# Patient Record
Sex: Male | Born: 1935 | Race: Black or African American | Hispanic: No | Smoking: Former smoker
Health system: Southern US, Community
[De-identification: ages and names within clinical notes are randomized; demographics above are authoritative.]

## PROBLEM LIST (undated history)

## (undated) DIAGNOSIS — IMO0001 Reserved for inherently not codable concepts without codable children: Secondary | ICD-10-CM

## (undated) DIAGNOSIS — R918 Other nonspecific abnormal finding of lung field: Secondary | ICD-10-CM

## (undated) DIAGNOSIS — I82409 Acute embolism and thrombosis of unspecified deep veins of unspecified lower extremity: Secondary | ICD-10-CM

## (undated) DIAGNOSIS — N183 Chronic kidney disease, stage 3 unspecified: Secondary | ICD-10-CM

## (undated) DIAGNOSIS — I2699 Other pulmonary embolism without acute cor pulmonale: Secondary | ICD-10-CM

## (undated) DIAGNOSIS — F0390 Unspecified dementia without behavioral disturbance: Secondary | ICD-10-CM

## (undated) DIAGNOSIS — E119 Type 2 diabetes mellitus without complications: Secondary | ICD-10-CM

## (undated) DIAGNOSIS — I10 Essential (primary) hypertension: Secondary | ICD-10-CM

## (undated) DIAGNOSIS — I340 Nonrheumatic mitral (valve) insufficiency: Secondary | ICD-10-CM

## (undated) DIAGNOSIS — E7800 Pure hypercholesterolemia, unspecified: Secondary | ICD-10-CM

## (undated) DIAGNOSIS — E039 Hypothyroidism, unspecified: Secondary | ICD-10-CM

## (undated) DIAGNOSIS — I272 Pulmonary hypertension, unspecified: Secondary | ICD-10-CM

## (undated) DIAGNOSIS — J449 Chronic obstructive pulmonary disease, unspecified: Secondary | ICD-10-CM

## (undated) DIAGNOSIS — I482 Chronic atrial fibrillation, unspecified: Secondary | ICD-10-CM

## (undated) DIAGNOSIS — I517 Cardiomegaly: Secondary | ICD-10-CM

## (undated) DIAGNOSIS — R55 Syncope and collapse: Secondary | ICD-10-CM

## (undated) DIAGNOSIS — N200 Calculus of kidney: Secondary | ICD-10-CM

## (undated) DIAGNOSIS — N281 Cyst of kidney, acquired: Secondary | ICD-10-CM

## (undated) DIAGNOSIS — I2510 Atherosclerotic heart disease of native coronary artery without angina pectoris: Secondary | ICD-10-CM

## (undated) DIAGNOSIS — Z5189 Encounter for other specified aftercare: Secondary | ICD-10-CM

## (undated) DIAGNOSIS — R001 Bradycardia, unspecified: Secondary | ICD-10-CM

## (undated) DIAGNOSIS — I071 Rheumatic tricuspid insufficiency: Secondary | ICD-10-CM

## (undated) DIAGNOSIS — I452 Bifascicular block: Secondary | ICD-10-CM

## (undated) DIAGNOSIS — I5042 Chronic combined systolic (congestive) and diastolic (congestive) heart failure: Secondary | ICD-10-CM

## (undated) DIAGNOSIS — C189 Malignant neoplasm of colon, unspecified: Secondary | ICD-10-CM

## (undated) DIAGNOSIS — I951 Orthostatic hypotension: Secondary | ICD-10-CM

## (undated) DIAGNOSIS — K439 Ventral hernia without obstruction or gangrene: Secondary | ICD-10-CM

## (undated) HISTORY — PX: ABDOMINAL SURGERY: SHX537

---

## 2004-09-14 ENCOUNTER — Emergency Department (HOSPITAL_COMMUNITY): Admission: EM | Admit: 2004-09-14 | Discharge: 2004-09-14 | Payer: Self-pay | Admitting: Emergency Medicine

## 2005-08-19 ENCOUNTER — Emergency Department (HOSPITAL_COMMUNITY): Admission: EM | Admit: 2005-08-19 | Discharge: 2005-08-19 | Payer: Self-pay | Admitting: Emergency Medicine

## 2005-08-22 ENCOUNTER — Inpatient Hospital Stay (HOSPITAL_COMMUNITY): Admission: EM | Admit: 2005-08-22 | Discharge: 2005-08-25 | Payer: Self-pay | Admitting: Emergency Medicine

## 2005-09-12 ENCOUNTER — Emergency Department (HOSPITAL_COMMUNITY): Admission: EM | Admit: 2005-09-12 | Discharge: 2005-09-12 | Payer: Self-pay | Admitting: Emergency Medicine

## 2005-09-18 ENCOUNTER — Emergency Department (HOSPITAL_COMMUNITY): Admission: EM | Admit: 2005-09-18 | Discharge: 2005-09-18 | Payer: Self-pay | Admitting: Emergency Medicine

## 2005-10-08 ENCOUNTER — Inpatient Hospital Stay (HOSPITAL_COMMUNITY): Admission: EM | Admit: 2005-10-08 | Discharge: 2005-10-12 | Payer: Self-pay | Admitting: Emergency Medicine

## 2005-10-17 ENCOUNTER — Emergency Department (HOSPITAL_COMMUNITY): Admission: EM | Admit: 2005-10-17 | Discharge: 2005-10-17 | Payer: Self-pay | Admitting: Emergency Medicine

## 2006-01-19 ENCOUNTER — Inpatient Hospital Stay (HOSPITAL_COMMUNITY): Admission: EM | Admit: 2006-01-19 | Discharge: 2006-01-22 | Payer: Self-pay | Admitting: Emergency Medicine

## 2006-02-20 HISTORY — PX: CARDIAC CATHETERIZATION: SHX172

## 2006-02-22 ENCOUNTER — Inpatient Hospital Stay (HOSPITAL_COMMUNITY): Admission: AD | Admit: 2006-02-22 | Discharge: 2006-03-07 | Payer: Self-pay | Admitting: Cardiovascular Disease

## 2006-02-22 ENCOUNTER — Encounter (INDEPENDENT_AMBULATORY_CARE_PROVIDER_SITE_OTHER): Payer: Self-pay | Admitting: *Deleted

## 2006-02-22 ENCOUNTER — Ambulatory Visit: Payer: Self-pay | Admitting: Pulmonary Disease

## 2006-02-22 ENCOUNTER — Encounter: Payer: Self-pay | Admitting: Emergency Medicine

## 2006-02-27 ENCOUNTER — Encounter (INDEPENDENT_AMBULATORY_CARE_PROVIDER_SITE_OTHER): Payer: Self-pay | Admitting: *Deleted

## 2006-08-06 ENCOUNTER — Inpatient Hospital Stay (HOSPITAL_COMMUNITY): Admission: EM | Admit: 2006-08-06 | Discharge: 2006-08-07 | Payer: Self-pay | Admitting: Emergency Medicine

## 2007-01-04 ENCOUNTER — Inpatient Hospital Stay (HOSPITAL_COMMUNITY): Admission: EM | Admit: 2007-01-04 | Discharge: 2007-01-05 | Payer: Self-pay | Admitting: Emergency Medicine

## 2007-05-24 ENCOUNTER — Inpatient Hospital Stay (HOSPITAL_COMMUNITY): Admission: EM | Admit: 2007-05-24 | Discharge: 2007-05-27 | Payer: Self-pay | Admitting: Emergency Medicine

## 2007-06-16 ENCOUNTER — Emergency Department (HOSPITAL_COMMUNITY): Admission: EM | Admit: 2007-06-16 | Discharge: 2007-06-16 | Payer: Self-pay | Admitting: Emergency Medicine

## 2008-05-29 ENCOUNTER — Emergency Department (HOSPITAL_COMMUNITY): Admission: EM | Admit: 2008-05-29 | Discharge: 2008-05-29 | Payer: Self-pay | Admitting: Emergency Medicine

## 2008-07-14 ENCOUNTER — Emergency Department (HOSPITAL_COMMUNITY): Admission: EM | Admit: 2008-07-14 | Discharge: 2008-07-14 | Payer: Self-pay | Admitting: Emergency Medicine

## 2008-12-20 ENCOUNTER — Observation Stay (HOSPITAL_COMMUNITY): Admission: EM | Admit: 2008-12-20 | Discharge: 2008-12-21 | Payer: Self-pay | Admitting: Emergency Medicine

## 2010-05-25 LAB — COMPREHENSIVE METABOLIC PANEL
ALT: 18 U/L (ref 0–53)
AST: 23 U/L (ref 0–37)
CO2: 30 mEq/L (ref 19–32)
Chloride: 104 mEq/L (ref 96–112)
Creatinine, Ser: 1.13 mg/dL (ref 0.4–1.5)
GFR calc Af Amer: >60 mL/min (ref >60)
GFR calc non Af Amer: >60 mL/min (ref >60)
Sodium: 138 mEq/L (ref 135–145)
Total Bilirubin: 0.7 mg/dL (ref 0.3–1.2)

## 2010-05-25 LAB — CBC
MCV: 98.2 fL (ref 78.0–100.0)
RBC: 3.23 MIL/uL — ABNORMAL LOW (ref 4.22–5.81)
WBC: 4.4 10*3/uL (ref 4.0–10.5)

## 2010-05-25 LAB — DIFFERENTIAL
Basophils Absolute: 0 10*3/uL (ref 0.0–0.1)
Basophils Relative: 0 % (ref 0–1)
Eosinophils Absolute: 0 10*3/uL (ref 0.0–0.7)
Eosinophils Relative: 1 % (ref 0–5)

## 2010-05-26 LAB — DIFFERENTIAL
Eosinophils Absolute: 0 10*3/uL (ref 0.0–0.7)
Eosinophils Relative: 1 % (ref 0–5)
Lymphocytes Relative: 35 % (ref 12–46)
Lymphs Abs: 1.4 10*3/uL (ref 0.7–4.0)
Monocytes Relative: 4 % (ref 3–12)
Neutrophils Relative %: 61 % (ref 43–77)

## 2010-05-26 LAB — COMPREHENSIVE METABOLIC PANEL
ALT: 20 U/L (ref 0–53)
AST: 25 U/L (ref 0–37)
Calcium: 10 mg/dL (ref 8.4–10.5)
GFR calc Af Amer: >60 mL/min (ref >60)
Sodium: 137 mEq/L (ref 135–145)
Total Protein: 7.8 g/dL (ref 6.0–8.3)

## 2010-05-26 LAB — CBC
MCHC: 34.5 g/dL (ref 30.0–36.0)
RDW: 13.8 % (ref 11.5–15.5)

## 2010-05-26 LAB — PROTIME-INR: Prothrombin Time: 12.4 seconds (ref 11.6–15.2)

## 2010-06-01 LAB — BASIC METABOLIC PANEL
CO2: 25 mEq/L (ref 19–32)
Calcium: 9.8 mg/dL (ref 8.4–10.5)
Creatinine, Ser: 1.29 mg/dL (ref 0.4–1.5)
Glucose, Bld: 107 mg/dL — ABNORMAL HIGH (ref 70–99)
Potassium: 3.8 mEq/L (ref 3.5–5.1)
Sodium: 139 mEq/L (ref 135–145)

## 2010-06-01 LAB — DIFFERENTIAL
Basophils Absolute: 0 10*3/uL (ref 0.0–0.1)
Basophils Relative: 1 % (ref 0–1)
Neutro Abs: 2.3 10*3/uL (ref 1.7–7.7)
Neutrophils Relative %: 49 % (ref 43–77)

## 2010-06-01 LAB — URINALYSIS, ROUTINE W REFLEX MICROSCOPIC
Glucose, UA: NEGATIVE mg/dL
Specific Gravity, Urine: 1.025 (ref 1.005–1.030)
pH: 5.5 (ref 5.0–8.0)

## 2010-06-01 LAB — CBC
MCHC: 34.4 g/dL (ref 30.0–36.0)
Platelets: 173 10*3/uL (ref 150–400)
RDW: 13.7 % (ref 11.5–15.5)

## 2010-07-05 NOTE — Consult Note (Signed)
NAME:  Bryce Mcdonald, Bryce Mcdonald             ACCOUNT NO.:  [id number]   MEDICAL RECORD NO.:  [id number]          PATIENT TYPE:  INP   LOCATION:  A301                          FACILITY:  [facility]   PHYSICIAN:  Tilford Pillar, MD      DATE OF BIRTH:  November 05, 1921   DATE OF CONSULTATION:  05/24/2007  DATE OF DISCHARGE:                                 CONSULTATION   REASON FOR CONSULTATION:  Abdominal pain and abdominal distention.   HISTORY OF PRESENT ILLNESS:  The patient is an 77 year old male with a  history of hypertension, history of colon cancer and history of prostate  cancer who presented to [facility] with approximately 24 hours  of increasing abdominal pain and abdominal distention.  This has slowly  increased over the prior subsequent days and continued to increase at  the time of this admission, he had decreased bowel function.  He has had  similar episodes in the past associated with partial small bowel  obstructions.  Currently, the patient states he is feeling better.  He  had a large bowel movement as of this morning which was normal in  consistency, nonbloody, no melena, no hematochezia.  He has had  improvement in his nausea with no episodes of emesis.  He has had fever  or chills.  Abdominal pain has resolved.  On discussion with the  patient, the patient's last colonoscopy was approximately 1 year ago,  but the patient states that at time it was a normal colonoscopy.  Additionally, the patient does have an abdominal hernia, but denies any  signs or symptoms of incarceration or strangulation.  The patient is  currently passing flatus.   PAST MEDICAL HISTORY:  1. Colon cancer.  2. Prostate cancer.  3. Hypertension.  4. History of DVT.   PAST SURGICAL HISTORY:  1. Colon resection for colon cancer.  The patient is unsure when the      colon resection occurred.  2. History of radical prostatectomy.  3. The patient has had an attempted ventral hernia repair at the [place]  approximately 1 year ago.   MEDICATIONS:  1. Docusate.  2. Oxycodone for pain.  3. Simvastatin.  4. Lisinopril.  5. Metoprolol.  6. Aspirin daily.  7. Lasix.  8. Lovenox 80 mg daily.   ALLERGIES:  No known drug allergies.   SOCIAL HISTORY:  No tobacco.  No alcohol.  No recreational drug use.   PHYSICAL EXAMINATION:  VITAL SIGNS:  Temperature 99.1 heart rate 71,  respirations 18, blood pressure 131/61.  GENERAL:  The patient is lying in supine position in a hospital bed.  He  is in no acute distress.  He is alert and oriented x3.  He is somewhat  disheveled in appearance.  HEENT:  Pupils are equal, round and reactive.  Extraocular extraocular  movements are intact.  No scleral icterus or conjunctival pallor is  noted.  His oral mucosa is pink.  He does have some noted tooth loss,  but occlusion appears to be normal.  Trachea is midline.  No cervical  lymphadenopathy appreciated.  PULMONARY:  Unlabored respirations.  He is clear to auscultation.  CARDIOVASCULAR:  Regular rate and rhythm.  ABDOMEN:  Soft without with somewhat diminished bowel sounds.  He is  moderately distended.  He is nontender on palpation.  He does have an  incisional midline hernia.  This is easily reducible and no incarcerated  material is noted in the hernia.  He has no peritoneal signs.  No masses  are appreciated.  EXTREMITIES:  Warm and dry.   LABORATORY DATA AND X-RAY FINDINGS:  CBC with white blood cell count  5.3, hemoglobin 12.1, hematocrit 34.8, platelets 137.  Basic metabolic  panel with sodium 144, potassium 3.5, chloride 106, bicarb 30, BUN 15,  creatinine 1.01, blood glucose 123.  Acute abdominal series demonstrates  no evidence of free air.  There are several loops of bowel with air  fluid levels.  No clear transition point.  He does have casts noted  within the rectum.   ASSESSMENT:  At this point, I suspect a partial small bowel obstruction  at the most.  Given the patient's current  presentation and  symptomatology, it appears that this is actually resolving.  I suspect a  likely etiology of adhesive processes as etiology for his partial small  bowel obstruction.  He does have a colon cancer history, but based on  his recent colonoscopy that was a year ago, this would be an unlikely  cause of bowel obstruction and as well, he does have a hernia.  I also  explained to him this is not the cause of his etiology of his  symptomatology.   RECOMMENDATIONS:  At this time, I would recommend continuing IV fluid  hydration and continuing limited bowel rest with ice chips and sips of  water at this point with continued NG suction for at least the next 24  hours secondary to the patient's distention.  As long as the patient  continues to have bowel function, I would then recommend advancing his  diet slowly and starting with a clear diet and advancing as tolerated.  This was discussed with the patient.  No acute surgical indications are  indicated.  I will continue to follow the patient's progression with  you.   I appreciate the opportunity to participate in this patient's care.      Tilford Pillar, MD  Electronically Signed     BZ/MEDQ  D:  05/24/2007  T:  05/24/2007  Job:  [id number]

## 2010-07-05 NOTE — H&P (Signed)
NAME:  Bryce Mcdonald, Bryce Mcdonald             ACCOUNT NO.:  [id number]   MEDICAL RECORD NO.:  [id number]          PATIENT TYPE:  INP   LOCATION:  A301                          FACILITY:  [facility]   PHYSICIAN:  Skeet Latch, DO    DATE OF BIRTH:  12-06-1921   DATE OF ADMISSION:  05/23/2007  DATE OF DISCHARGE:  LH                              HISTORY & PHYSICAL   CHIEF COMPLAINT:  Abdominal pain.   ADMITTING DIAGNOSES:  1. Small-bowel obstruction versus ileus.  2. History of colon cancer.  3. History of prostate cancer.  4. History of hypertension.   HISTORY OF PRESENT ILLNESS:  This is an 77 year old African American  male who presents with abdominal pain.  The patient states that after  dinner he started having abdominal distention and pain.  The patient  states the pain is located in his mid abdomen and abdominal region with  some radiation to his back.  He does not characterize it as severe pain,  more of an achiness in nature.  It is aggravated by deep breathing.  The  patient was seen at [facility] back in November 2008 for abdominal pain  and was admitted and discharged with a 2-day hospital stay.  At that  time his abdominal series showed a minimal ileus with no obstruction.  CT showed gallbladder dilatation and pericholecystic fluid suggesting  cholecystitis.  The patient presented again with similar symptoms.  He  did have an acute abdominal series in the ER which showed COPD and early  small-bowel obstruction versus ileus.  The patient currently has an NG  tube in place.  The patient states that his abdominal pain is greatly  improved and at this point is stable.   PAST MEDICAL HISTORY:  1. Colon cancer.  2. Hypertension.  3. Prostate cancer.  4. Deep vein thrombosis.   PAST SURGICAL HISTORY:  Colon resection with extensive abdominal surgery  secondary to colon CA.   SOCIAL HISTORY:  Nonsmoker, nondrinker.  No history of drug abuse.   ALLERGIES:  No known drug allergies.   FAMILY HISTORY:  Unknown.   CURRENT HOME MEDICATIONS:  1. Docusate sodium 100 mg twice daily.  2. Oxycodone/acetaminophen as needed.  3. Simvastatin 40 mg at bedtime.  4. Lisinopril 5 mg once a day.  5. Metoprolol 50 mg twice a day.  6. Aspirin 81 mg daily.  7. Furosemide 40 mg once a day.  8. Lovenox 80 mg daily.   REVIEW OF SYSTEMS:  GI:  Positive for abdominal pain with some nausea  and vomiting.  CARDIOVASCULAR:  No chest pain, no palpitations.  RESPIRATORY:  No shortness of breath or dyspnea.  MUSCULOSKELETAL:  No  arthralgias, myalgias.  HEENT:  Unremarkable.  NEUROLOGIC:  Unremarkable.   PHYSICAL EXAMINATION:  VITAL SIGNS:  Temperature is 99.1, pulse 71,  respirations 18, blood pressure is 131/61.  GENERAL:  He is well-developed, well-nourished, well-hydrated, in no  acute distress.  HEENT:  Head is atraumatic, normocephalic.  PERRLA.  EOMI.  NECK:  Soft, supple, nontender, nondistended.  No JVD, no thyromegaly.  CARDIAC:  Regular rate and rhythm.  Distant heart sounds.  No murmurs  appreciated.  LUNGS:  Clear to auscultation bilaterally.  No rales, rhonchi or  wheezing.  ABDOMEN:  Distended.  No tenderness on deep palpitation.  He does have  bowel sounds that are slightly hypoactive.  No rigidity or guarding.  EXTREMITIES:  He does have chronic venous stasis changes to bilateral  lower extremities with trace edema bilaterally.  NEUROLOGIC:  Cranial nerves II-XII are grossly intact.  The patient is  alert and oriented and awake.   LABORATORIES:  Urinalysis is fairly unremarkable.  Lipase is 30.  Sodium  140, potassium 3.2, chloride is 104, CO2 26, glucose 125, BUN 17,  creatinine 1.06.  White count is 5000, hemoglobin 12.9, hematocrit 37.3,  platelets 178.   ASSESSMENT:  1. Abdominal pain, probably secondary to ileus versus obstruction.  2. History of colon cancer.  3. History of prostate cancer.  4. History of hypertension.  5. History of deep vein thrombosis.    PLAN:  1. The patient will be admitted to general medical bed, the service of      [facility].  2. The patient will have nasogastric tube in place with low      intermittent suction for decompression at this time.  3. Will get a general surgery consult and await their recommendations.  4. Will continue IV hydration and the patient will be kept n.p.o. at      this time.  5. For his hypertension, will place the patient on his home      medications when he is no longer n.p.o. and he can      tolerate clear liquids.  6. The patient will be placed on GI as well as DVT prophylaxis also.   The patient will continue to be monitored very closely.      Skeet Latch, DO  Electronically Signed     SM/MEDQ  D:  05/24/2007  T:  05/24/2007  Job:  [id number]

## 2010-07-05 NOTE — Discharge Summary (Signed)
NAME:  Bryce Mcdonald, Bryce Mcdonald             ACCOUNT NO.:  [id number]   MEDICAL RECORD NO.:  [id number]          PATIENT TYPE:  INP   LOCATION:  A315                          FACILITY:  [facility]   PHYSICIAN:  Dorris Singh, DO    DATE OF BIRTH:  [birth year]   DATE OF ADMISSION:  08/06/2006  DATE OF DISCHARGE:  06/17/2008LH                               DISCHARGE SUMMARY   ADMISSION DIAGNOSES:  1. Pancytopenia.  2. Diarrhea.  3. Weakness.   DISCHARGE DIAGNOSES:  1. Cholelithiasis.  2. Possible exposure to Lyme disease.  3. Nausea and vomiting.  4. Pancytopenia.   PRIMARY CARE PHYSICIAN:  Patient does not have a primary care physician.   CONSULTATIONS:  There were no consults on this case.   PROCEDURE:  Patient had an acute abdominal series that showed no acute  abdominal findings.  COPD was stable basal scarring on August 06, 2006.  Patient had an abdominal ultrasound done which did show cholelithiasis  without evidence of cholecystitis, a small peri-pancytopenic lymph node,  poor visualization of the pancreas.  Did not see a lymph node at that  time on a CT done in January 2008.  Recommended follow-up CT to assess  and concern for pancreatic disease small bilateral renal cysts.   HISTORY AND PHYSICAL:  Patient is an 77 year old African American male  who presented to [facility] on August 06, 2006, with a chief  complaint of weakness and nausea and vomiting.  Had stated that several  weeks ago he was bitten by a tick, so he was concerned of the  possibility of Lyme disease.  He has a significant history of colon  cancer and he is a poor historian as to whether or not he is cancer-  free.  Patient stated that every time he ate he got very nauseated and  would throat up.  Denies any other concerns at this time but had become  very weak from this episode.   PAST MEDICAL HISTORY:  Colon cancer.   FAMILY HISTORY:  Patient could not give me any answers at that point in  time.   It  was based on the patient's clinical appearance.  It was determined  that patient should be admitted and hydrated and given antiemetic  medication.  He was given that throughout the night and his nausea and  vomiting did subside.  He did not have any other episodes of diarrhea,  which we had ordered cultures, ova and parasites tests for.  Patient was  able to eat a full diet at that point in time.  It was determined that  possible abdominal ultrasound should be done based on the history and  timing of the nausea and vomiting.  Patient  had an abdominal ultrasound  done today which showed the following as read above.  Also another CBC  was done.  One at admission, his white count was 2.5 and then today it  was 1.9.  He had hemoglobin of 12.6 and hematocrit 35.6 on admission and  then today hemoglobin was 11.9 and hematocrit was 33.5.  This is all  probably due  to a dilutional effecting, worsening the current  pancytopenia.  He also had PTT/INR done and his INR was 1.2.  His  initial CMP was sodium 136, potassium 3.8, chloride 98, glucose 116 and  CO2 28, BUN 13.  His repeat CMP showed sodium 138, potassium 3.5,  chloride 105, CO2 27, glucose 105, BUN 12, creatinine 1.2.  His stool  culture actually was not done but his blood culture after one day's  growth did not show anything.  At this point in time it was determined  patient could be discharged to home.   CONDITION ON DISCHARGE:  Good and stable.   DISPOSITION:  To home.   DISCHARGE MEDICATIONS:  1. Augmentin XR 1000 mg two tablets b.i.d. for 10 days.  2. Phenergan 12.5 mg one tablet p.o. t.i.d. p.r.n. nausea.   DISCHARGE INSTRUCTIONS:  [facility] set up an appointment for  him to be seen by Dr. Malvin Johns on August 09, 2006, at 11 o'clock.  Also,  will set up appointments with Dr. Mariel Sleet of hematology/oncology  regarding his pancytopenia.  I recommended patient to be seen by local  primary care doctor in this area.  Patient  was instructed to follow up  at the emergency room if symptoms worsen.      Dorris Singh, DO  Electronically Signed     CB/MEDQ  D:  08/07/2006  T:  08/08/2006  Job:  [id number]

## 2010-07-05 NOTE — H&P (Signed)
NAME:  Bryce Mcdonald, Bryce Mcdonald             ACCOUNT NO.:  [id number]   MEDICAL RECORD NO.:  [id number]          PATIENT TYPE:  INP   LOCATION:  A315                          FACILITY:  [facility]   PHYSICIAN:  Dorris Singh, DO    DATE OF BIRTH:  1921-03-24   DATE OF ADMISSION:  08/06/2006  DATE OF DISCHARGE:  LH                              HISTORY & PHYSICAL   The patient's primary care is in [place].   PAST MEDICAL HISTORY:  The patient is an 77 year old African-American  male who presented to [facility] emergency room with the chief complaint  of weakness. States that he had been weak for several weeks and had been  bitten by a tick about 2 weeks ago. The patient has a significant  history of colon cancer in which he states he has had multiple  surgeries. The patient states he does not know if he is cancer free at  this point in time but states that he was not feeling well, so at this  point in time based on current blood work, it was determined the patient  should be admitted to the service of [facility] at that point in time.   PAST MEDICAL HISTORY:  Colon cancer.   FAMILY HISTORY:  The patient did not know.   PAST SURGICAL HISTORY:  In the past, the patient has had extensive  abdominal surgery to resect the colon cancer.   SOCIAL HISTORY:  Nonsmoker, nondrinker. No drug abuse known. No known  drug allergies.   CURRENT MEDICATIONS:  1. Docusate sodium 100 mg twice a day.  2. Oxycodone and acetaminophen as needed.  3. Coumadin 2.5, dosing as prescribed.   REVIEW OF SYSTEMS:  Pertinent for positive weakness, diarrhea, nausea,  vomiting, fatigue. For skin, positive sore at tick bite site.   PHYSICAL EXAMINATION:  The patient is an 77 year old African-American  male who was well-nourished, well-developed, in no acute distress. His  vitals are as follows:  His temperature is 97.7, pulse 78, respirations  20, blood pressure 121/69.  HEAD:  Is normocephalic, atraumatic. EYES - equal  reactive to light.  EARS are symmetrical. No discharge noted. NOSE - turbinates are moist.  MOUTH - missing teeth. Hygiene is fair.  NECK:  Is supple. Full range of motion.  HEART:  Regular rate and rhythm. No murmurs.  LUNGS:  Clear to auscultation bilaterally.  ABDOMEN:  Distended and scarred with multiple healing surgical scars on  abdomen. No CVA tenderness.  MUSCULOSKELETAL:  No muscle atrophy. Full range of motion for joints.  LYMPHATIC:  No cervical or axillary adenopathy.  NEUROLOGICAL:  Cranial nerves II-XII grossly intact. Reflexes present.  SKIN:  On right side of lateral hip, linear abrasions healing with one  large abrasion where the patient claims a tick was found and was  removed.   Labs that were done:  White count of 2.5, hemoglobin of 12.6, hematocrit  of 35.6, platelets 97. CMP:  136 for sodium, potassium 3.8, chloride 98,  CO2 28, glucose 116, BUN 13, creatinine 1.23. Blood cultures were  ordered for patient.   ASSESSMENT AND PLAN:  Pancytopenia, diarrhea and weakness colon ca hx  of, n/v.   PLAN:  1. The patient will be admitted to the service of [facility].  2. We will do a CMP, CBC, PT/PTT in the a.m. Will also culture for      parasites/ova and Hemoccult stool and blood cultures x2.  3. will obtain an [place] of abdomen due to chronic nature of N/V  There is a concern, too, of Lyme disease exposure. Will start patient on  antibiotics, Augmentin p.o. 1000 mg b.i.d. Also, patient will be put on  home medications, Coumadin 2.5 p.o. daily, docusate sodium 100 mg p.o.  daily, oxycodone and acetaminophen 1 p.o. t.i.d. p.r.n., Protonix 40 mg  p.o. daily and Xanax 0.25 mg p.o. t.i.d. for anxiety.   Will go ahead and monitor patient to see if symptoms improve. Will give  him Zofran 4 mg IV p.r.n., and if patient is improved, anticipate  possible discharge within 1 to 2 days.      Dorris Singh, DO  Electronically Signed     CB/MEDQ  D:  08/06/2006  T:   08/07/2006  Job:  [id number]

## 2010-07-05 NOTE — Discharge Summary (Signed)
NAME:  Bryce Mcdonald, Bryce Mcdonald             ACCOUNT NO.:  [id number]   MEDICAL RECORD NO.:  [id number]          PATIENT TYPE:  INP   LOCATION:  A301                          FACILITY:  [facility]   PHYSICIAN:  Dorris Singh, DO    DATE OF BIRTH:  Sep 27, 1921   DATE OF ADMISSION:  05/23/2007  DATE OF DISCHARGE:  04/06/2009LH                               DISCHARGE SUMMARY   ADMISSION DIAGNOSIS INCLUDES:  1. Small-bowel obstruction versus ileus.  2. History of colon cancer.  3. History of prostate cancer.  4. History of hypertension.   DISCHARGE DIAGNOSIS:  1. Small-bowel obstruction, resolved.  2. History of colon cancer.  3. History of prostate cancer.  4. History of hypertension.  5. Chronic obstructive pulmonary disease.   PRIMARY CARE PHYSICIAN:  In [place].   RADIOLOGY TESTS:  That were done include on April 2 an acute abdominal  series which demonstrated COPD no acute abnormality of the chest, early  small-bowel obstruction versus ileus.  He had a CT abdomen on April 3rd  which demonstrated stable left renal cyst, dilated small bowel loops.  CT of the pelvis partial small-bowel obstruction and an anastomotic  staple line in the upper right pelvis with decompression of distal small  bowel and colon, sigmoid diverticulosis; and stable, mildly enlarged  common iliac lymph node.   His H&P was done by Dr. Lilian Kapur.  To summarize this, he is an 77-year-  old African-American male who presented to the emergency room with a  chief complaint of abdominal pain.  The patient had had a history of  this, before, at [facility] back in November 2008.  He was admitted to a  general medicine bed at the service of [facility].  A nasal gastric tube  was placed, and it was put on low suction; as well as a general surgery  consult was also obtained.  IV hydration was obtained.  The patient was  kept n.p.o.  The patient was seen by surgery who recommended that we  continue with the nasogastric.  The NG-tube  with suctioned, and they  continued to follow him.  The patient continued to do well.  At that  point in time, we went ahead and clamped on the 4th the NG-tube.   He continued to do well without having any nausea and vomiting.  On the  5th the NG-tube was removed.  The patient's diet was increased, and he  continued to do well.  On the 6th it was determined, at that point in  time, that the patient could be discharge.  He was now eating a full  diet.   His vitals for, today, are stable.  His lab work:  White count of 3.5,  hemoglobin of 12.1, hematocrit of 34.2, platelet count of 167.  His  chemistry was within normal limits except for elevated glucose at 108.   DISCHARGE MEDICATIONS:  1. Dulcolax sodium 100 mg b.i.d.  2. Acetaminophen there is no dose given p.r.n.  3. Simvastatin 40 mg nightly.  4. Lisinopril 5 mg p.o. daily.  5. Metoprolol XR 850 grams p.o. daily.  6. Aspirin  81 mg p.o. daily.  7. Furosemide 40 mg p.o. daily.   DISCHARGE INSTRUCTIONS:  1. He will be asked to increase his activity slowly.  2. To increase his diet as tolerated.  3. He has a primary care physician in [place].  He has an      appointment with him, next week, we will ask him to follow up with      that.  4. He is to resume all home meds, and he has information noting that      he was admitted for a small-bowel obstruction due to his history of      abdominal surgery from his colon cancer.  5. He is to return if symptoms worsen.   CONDITION:  Stable.   DISPOSITION:  To home.      Dorris Singh, DO  Electronically Signed     CB/MEDQ  D:  05/27/2007  T:  05/27/2007  Job:  [id number]

## 2010-07-05 NOTE — Group Therapy Note (Signed)
NAME:  Bryce Mcdonald, Bryce Mcdonald             ACCOUNT NO.:  [id number]   MEDICAL RECORD NO.:  [id number]          PATIENT TYPE:  INP   LOCATION:  A314                          FACILITY:  [facility]   PHYSICIAN:  Skeet Latch, DO    DATE OF BIRTH:  04/24/1921   DATE OF PROCEDURE:  DATE OF DISCHARGE:  01/05/2007                                 PROGRESS NOTE   SUBJECTIVE:  Mr. Jurney seems to be greatly improved today. The  patient denies any abdominal pain, nausea, vomiting, diarrhea or chest  pain. The patient's diet has recently been increased and will be having  breakfast in the near future.   OBJECTIVE:  VITAL SIGNS: Temperature 98.7, pulse 56, respirations 20,  blood pressure 117/58.  CARDIOVASCULAR: Regular rate and rhythm. No rubs, gallops or murmurs.  LUNGS: Clear. No rales, rhonchi or wheezing.  ABDOMEN: Distended. No tenderness on palpation. No hepatosplenomegaly.  He does have some slight right upper quadrant pain which seems to be  chronic in nature. No rebound tenderness.  EXTREMITIES: No clubbing, cyanosis or edema.   LABORATORY DATA:  Sodium 138, potassium 3.8, chloride 102, CO2 32,  glucose 111, BUN 9, creatinine 1.12. White count is 5.7, hemoglobin  12.3, hematocrit 35.9, platelets 184.   ASSESSMENT/PLAN:  (1) Persistent abdominal pain. The patient's diet has  been increased and we will see if patient can tolerate his breakfast and  lunch and if so, patient probably will be discharged in the next 24  hours. (2) Colon cancer. This can be followed up as an outpatient.   Patient's ultrasound did show cholelithiasis which seems to be chronic  in nature. The patient states that he was told he had gallstones in the  past, but surgery would not be recommended at this time. Lastly, patient  will be followed to see if he tolerates his breakfast and lunch, if so,  the patient probably will be discharged today.      Skeet Latch, DO  Electronically Signed     SM/MEDQ  D:   01/05/2007  T:  01/06/2007  Job:  [id number]

## 2010-07-05 NOTE — H&P (Signed)
NAME:  Bryce Mcdonald, Bryce Mcdonald             ACCOUNT NO.:  [id number]   MEDICAL RECORD NO.:  [id number]          PATIENT TYPE:  INP   LOCATION:  A314                          FACILITY:  [facility]   PHYSICIAN:  Skeet Latch, DO    DATE OF BIRTH:  08/19/1921   DATE OF ADMISSION:  01/03/2007  DATE OF DISCHARGE:  LH                              HISTORY & PHYSICAL   CHIEF COMPLAINT:  Abdominal pain.  The patient's primary care is the [facility]  in [place], [place].   HISTORY OF PRESENT ILLNESS:  This is an 77 year old African-American  male, who presented to the emergency room with chief complaint of  abdominal pain of about 2 days' duration.  The patient states that  yesterday he began having abdominal pain, and this has continued until  today.  The patient describes the pain as constant in nature but not  sharp.  The patient states that he also has had some nausea and vomiting  associated with this pain.  The patient does have a history of colon  cancer and has had multiple abdominal surgeries secondary to this.  The  patient is being followed at the [facility] in [place] for his cancer at this  time.  Upon my exam, the patient's pain seemed to be under control with  medications he has received in the emergency room and on the floor.   PAST MEDICAL HISTORY:  1. Colon cancer.  2. Hypertension.  3. Prostate cancer.  4. DVT.   PAST SURGICAL HISTORY:  Colon resection and extensive abdominal  surgeries secondary to his colon cancer.   SOCIAL HISTORY:  Nonsmoker, nondrinker.  No history of drug abuse.   ALLERGIES:  No known drug allergies.   FAMILY HISTORY:  Unknown.   CURRENT HOME MEDICATIONS:  1. Docusate sodium 100 mg p.o. twice daily.  2. Oxycodone 1 tab p.o. p.r.n.  3. Simvastatin 40 mg daily.  4. Lisinopril 5 mg daily.  5. Metoprolol 50 mg twice daily.  6. Aspirin 81 mg daily.  7. Furosemide 40 mg daily.  8. Lovenox 80 mg subcu daily.  9. Protonix 40 mg daily.  10.Tylenol 650 mg q.4h.  p.r.n.  11.Milk-of-magnesia daily.  12.Ambien 5 mg p.r.n.   REVIEW OF SYSTEMS:  Pertinent positives are for some weakness, nausea,  and vomiting, abdominal pain, fatigue.   PHYSICAL EXAM:  VITAL SIGNS:  Temperature is 97, pulse 61, respirations  20, blood pressure 151/73.  CONSTITUTIONAL:  He is 77 years old, well-nourished, well-hydrated, in  no acute distress, appears stated age.  HEENT:  Head is atraumatic, normocephalic, PERRLA, EOMI, no scleral  icterus.  He has missing teeth, good hygiene.  NECK:  Soft, supple, nontender, nondistended.  HEART:  Distant, regular, no murmurs or rubs.  LUNGS:  Clear to auscultation bilaterally; no rales, rhonchi, or  wheezing.  ABDOMEN:  Distended.  There were multiple scars secondary to surgery.  Tympanic.  Decreased bowel sounds but present.  Pain minimal on  palpation except for right upper quadrant.  MUSCULOSKELETAL:  Full range of motion; no atrophy is appreciated.  NEUROLOGIC:  Cranial nerves 2-12 are grossly  intact.   LABS:  Urinalysis is unremarkable.  Lipase is 42, sodium 140, potassium  3.5, chloride 106, CO2 29, glucose 136, BUN 15, creatinine 1.28, alk  phosphatase 62, AST 18, ALT 14, total protein 6.7.  PT is 13.1, INR 1.0,  white count 4.3, hemoglobin 12.4, hematocrit 36.5, platelets 199.   RADIOLOGICAL STUDIES:  Acute abdominal series showed minimal ileus, no  obstruction.  CT of his abdomen showed gallbladder is dilated with  pericholecystic fluid suggesting cholecystitis.  Further evaluation with  ultrasound or HIDA scan will be useful if indicated.  There is a 0.5 cm  right middle lobe pulmonary nodule.  I recommend followup with chest CT  in 6 months given history of cancer.  Small hiatal hernia, left renal  cyst, prostomegaly, ventral hernia, and postop changes around noted.   ASSESSMENT AND PLAN:  1. Abdominal pain which could be secondary to his colon cancer as well      as possible multiple adhesions from multiple  surgeries.  At this      time, we will get an ultrasound to rule out any gallbladder      disease.  As stated, we will get an abdominal ultrasound to rule      out any gallbladder disease.  The patient on a pain medication and      antiemetics as needed.  Depending on results of ultrasound, may      need evaluation by either gastroenterology or general surgery.  2. We will repeat his CBC, LFTs in the morning.  3. For his hypertension, we will continue the patient on his home      medications with lisinopril and metoprolol.  4. For gastrointestinal prophylaxis, the patient will be maintained on      Protonix.  5. For deep venous thrombosis prophylaxis, he will be maintained on      his Lovenox at this time.      Skeet Latch, DO  Electronically Signed     SM/MEDQ  D:  01/04/2007  T:  01/04/2007  Job:  [id number]

## 2010-07-08 NOTE — H&P (Signed)
NAME:  Bryce Mcdonald, Bryce Mcdonald             ACCOUNT NO.:  [id number]   MEDICAL RECORD NO.:  [id number]          PATIENT TYPE:  INP   LOCATION:  A328                          FACILITY:  [facility]   PHYSICIAN:  Hanley Hays. Dechurch, M.D.DATE OF BIRTH:  Sep 16, 1921   DATE OF ADMISSION:  10/08/2005  DATE OF DISCHARGE:  LH                                HISTORY & PHYSICAL   HISTORY OF PRESENT ILLNESS:  Patient is an 77 year old African American  gentleman followed by the [facility]/Dr. Birder Robson with a past medical history  remarkable for colon cancer status post resection June2007 with resultant  colostomy.  Cancer status post partial colectomy and ileostomy in June2007.  He was hospitalized early July for small bowel obstruction and was managed  conservatively with resolution.  He had a small area of fascial dehiscence  which has continued to heal.  The patient has been seen in the emergency  room twice since then, once with constipation and at the end of July with  abdominal pain.  He presented again today complaining of increased abdominal  pain, nausea and vomiting x2 days and generalized weakness.  Workup revealed  no evidence of abscess or significant bowel obstruction but BUN is elevated  at 45 with a creatinine of 2.5 where on July30 it that was 9 and 0.8,  respectively.  He is admitted to the hospital with acute renal failure,  likely prerenal in etiology.  The patient has also been noted to have oral  thrush per his daughter but had not had any medications as of yet.  He  states he has no appetite for food and generally does not feel well.  Denies  any odynophagia, notes he feels bloated all the time with gas but has had  consistent ostomy output.  He has lost about 6 pounds in the last week or so  according to his daughter but I do not have anything to compare.  He has  never been an overweight person.  In any event, he is admitted for further  evaluation and treatment.   REVIEW OF SYSTEMS:  As  noted above no fever or chills.  No GU complaints.  No confusion.  Complains of being cold all the time.   MEDICATIONS:  Daughter denies but yet he was discharged on ranitidine,  aspirin and acetylcysteine, although he is not taking any medications as of  this time.   ALLERGIES:  NONE KNOWN.   SOCIAL HISTORY:  The patient lives with his son.  He has eight children; one  son recently treated for colon cancer.  He has eight siblings as well; he is  the third of the nine children and multiple family members with cancer  though specifics are not known.  He was previously independent until his  surgery in June.  No alcohol or tobacco abuse.   PHYSICAL EXAMINATION:  GENERAL:  A thin elderly gentleman who is somewhat  sedated after pain medication.  VITAL SIGNS:  Weight 128.5 pounds, blood pressure is 132/64, temperature is  97, pulse 84 and regular, respirations are unlabored.  O2 saturation is 98%  on  room air.  NECK:  His neck is supple.  There is no JVD.  LUNGS:  His lungs are clear to auscultation anterior and posterior.  HEART:  The heart is regular.  No murmur.  ABDOMEN:  The abdomen is flat and soft.  There is a right ileostomy draining  some liquid stool.  There is a small inferior aspect of the surgical wound  that is well granulated and without any drainage and beginning to  epithelialize.  EXTREMITIES:  The extremities are without clubbing or cyanosis.  He has no  edema.  NEURO:  He is able to arouse and answer questions appropriately.  He is not  in any distress.  He moves all extremities x4 and follows commands.  He is  oriented to time, place and circumstance.   ASSESSMENT/PLAN:  1. Acute renal failure likely on the basis of prerenal azotemia.  There      was no evidence of obstruction.  The patient did have a noncontrast CT      which did not reveal any renal or bowel obstruction.  He was seen by      Dr. Lovell Sheehan who did not feel there was any issues that would warrant       his intervention fortunately.  2. Recent colon cancer resection.  We will again attempt to obtain some      records from the [facility] regarding the extent to this disease, though family      was told that they got it all.  3. Question adjustment disorder versus depression in this patient who has      no appetite and failure to thrive, previously very active without any      obvious other issues.  We will check a TSH and consider antidepressant      therapy.  4. Oral thrush.  Diflucan daily for 1 week.   Plan is discussed with the family and the patient who seem to have  reasonable understanding.      Hanley Hays Josefine Class, M.D.  Electronically Signed     FED/MEDQ  D:  10/08/2005  T:  10/08/2005  Job:  [id number]

## 2010-07-08 NOTE — Discharge Summary (Signed)
NAME:  Bryce Mcdonald, Bryce Mcdonald NO.:  [id number]   MEDICAL RECORD NO.:  [id number]          PATIENT TYPE:  INP   LOCATION:  A217                          FACILITY:  [facility]   PHYSICIAN:  Osvaldo Shipper, MD     DATE OF BIRTH:  [birth year]   DATE OF ADMISSION:  01/19/2006  DATE OF DISCHARGE:  12/03/2007LH                               DISCHARGE SUMMARY   PRIMARY CARE PHYSICIAN:  Located at [facility] in [place], [place]  [place].   Please see H&P dictated by Dr. Mikeal Hawthorne for details regarding patient's  presenting illness.   DISCHARGE DIAGNOSES:  1. Right lower extremity deep vein thrombosis requiring      anticoagulation.  2. History of colon cancer status post collection, apparently not a      candidate for chemotherapy.  3. Anemia, likely of chronic disease.  4. Microcytosis with low borderline B12 level.   HOSPITAL COURSE:  Briefly, this is an 77 year old African-American who  has history of colon cancer and no other medical problems who is a  patient at the [facility] in [place].  He underwent colectomy earlier  this year over there and had apparently been told that he is not a  candidate for chemotherapy.  The patient presented to our hospital  complaining of right leg pain and swelling.  He was found to have  tenderness in the right leg along with swelling.  The patient underwent  a Doppler study which indeed showed right DVT, specifically in the right  common femoral, deep vein with clot propagation to the right  saphenofemoral junction and into the entire right great saphenous vein  consistent with associated right greater saphenous vein  thrombophlebitis.  Patient's risk factor for developing this DVT is  obviously colon cancer.  He will need to be on anticoagulation.  He was  given Lovenox subcutaneously, and Coumadin is being initiated today.  It  was noted, however, at the time of admission that his hemoglobin was 9.5  with MCV of 100.  He denied any black stool  or any blood in the stool  recently.  Hemoglobin and hematocrit remained stable during this  admission.  He did have borderline B12 level of 193.  Folate was normal.  Homocysteine and methylmalonic acid levels were sent off.  Homocysteine  came back at 13.1. We are still awaiting the methylmalonic acid levels.   In any case, the patient's symptoms improved over the 2 days that he was  here.  He was educated about his diagnosis and was told all the risks  and benefits associated with  Coumadin therapy.  He was told that if he  experienced any kind of bleeding in his urine or his stool or if he felt  lightheaded or dizzy, he needed to seek attention immediately.  He was  also told to talk with his primary doctor regarding referral to a  gastroenterologist again or a hematologist for his anemia.   I also discussed the above case with his primary care physician, Dr.  Hanley Ben, because patient will need to be on Coumadin, and his INR  will  need to be managed.  This is especially an issue because patient  lives in [place], and home health needs a primary doctor before they  will administer and check INRs.  Dr. Birder Robson kindly agreed to be called  for PT/INR checks and to adjust his Coumadin.  I informed the case  manager and home health will be arranged to give him Lovenox and check  his PT/INR in a few days.   DISCHARGE MEDICATIONS:  1. Lovenox 1.5 mg/kg subcutaneously daily for the next 5 days.  2. Coumadin 5 mg p.o. q.p.m. starting tonight.  PT/INR to be checked      on Friday, January 26, 2006, in the morning.  That would given him      about 4 doses, and then further adjustment to Coumadin may be made      at that point.  The decision to discontinue Lovenox will also need      to be made depending on his PT/INR.   May resume other home meds as before. See H&P.   FOLLOW UP:  Primary care physician. According to the patient's daughter,  this is scheduled for December 11.  We  encouraged them to keep this  appointment.   As mentioned above, the patient may benefit from a referral to a  hematologist and/or a gastroenterologist.   DIET:  As before.   PHYSICAL ACTIVITY:  As before.   OTHER ISSUES:  I discussed all of the above with the daughter as well,  and she also verbalized understanding of the situation.   No consultations were obtained during this hospital stay.   Hypokalemia was also noted during this admission which was also  corrected.   Please note:  The patient was also given 1 shot of B12 IM during this  admission.   Total time spent at discharge: About 35 minutes.      Osvaldo Shipper, MD  Electronically Signed     GK/MEDQ  D:  01/22/2006  T:  01/22/2006  Job:  [id number]

## 2010-07-08 NOTE — Discharge Summary (Signed)
NAME:  Bryce Mcdonald, Bryce Mcdonald             ACCOUNT NO.:  [id number]   MEDICAL RECORD NO.:  [id number]          PATIENT TYPE:  INP   LOCATION:  A328                          [facility]:  [facility]   PHYSICIAN:  Lonia Blood, M.D.      DATE OF BIRTH:  01-30-1922   DATE OF ADMISSION:  10/08/2005  DATE OF DISCHARGE:  08/23/2007LH                                 DISCHARGE SUMMARY   PRIMARY CARE PHYSICIAN:  [place].   DISCHARGE DIAGNOSES:  1. Acute renal failure, now resolved.  2. Diarrheal disease.  3. Chronic anemia.  4. History of colon cancer, status post partial resection.  5. Oral thrush.   DISCHARGE MEDICATIONS:  1. Diflucan 100 mg daily for the next 3 days.  2. Protonix 40 mg daily for GERD symptoms.   DISPOSITION:  The patient is currently stable.  He is still weak but he has  gained some strength.  He is going to be discharged to resume his care at  [facility].  He has had Aeromonas in his stool, but that is not deemed to be  significant enough to require any treatment.  He is probably slightly  immunocompromised, which is probably why he has both the thrush and the  Aeromonas and most likely from chronic illnesses.   PROCEDURES PERFORMED THIS ADMISSION:  CT abdomen and pelvis on October 08, 2005, that showed no acute findings in the abdomen and no evidence of bowel  obstruction, also renal cyst.  Postoperative changes in the pelvis but no  acute findings.   CONSULTATIONS:  None.   BRIEF HISTORY AND PHYSICAL:  Please refer to dictated history and physical  by Hanley Hays. Bryce Mcdonald, M.D.  In short, however, the patient is an 77-year-  old African-American male that came in on the 19th with a complaint of  abdominal pain.  The patient has had colon cancer and had bowel resection in  June.  He has a colostomy in place, and he came in with weakness and  abdominal pain.  In the ED he was found to be in acute renal failure with  creatinine of 2.5 and also dehydrated.  He was subsequently  admitted for  further management.   HOSPITAL COURSE:  Problem 1.  ACUTE RENAL FAILURE:  The patient's BUN and creatinine was, as  indicated, 2.5 on admission.  His baseline was around 0.8.  He subsequently  got worked up from the CT and other workup shows that this was not  obstructive.  The patient received IV fluids and once his dehydration was  corrected, his renal function had returned to normal.  Of note, he has been  having some diarrhea.  His stool was cultured and it grew Aeromonas.  He  must have been dehydrated from this diarrhea.  At the moment he is stable.  The diarrhea has slowed down.  His kidney functions have improved.   Problem 2.  DIARRHEA:  As indicated, this most likely was some form of  gastroenteritis.  He had some nausea and vomiting in addition to abdominal  pain on arrival, which has since resolved also.  Problem 3.  ANEMIA:  The patient's hemoglobin has been on the low side, but  that is chronic anemia probably from chronic diseases.  His last hemoglobin  is 11.9, which is close to his baseline.  Back in July it was 11.5.   Problem 4.  HISTORY OF COLON CANCER:  Again, the patient has been stable  from the point of view of colon cancer at this point.   Problem 5.  ORAL THRUSH:  The patient had oral thrush per his daughter.  This may be related to his chronic debilitation.  He has been on Diflucan  for 4 days now and will complete 7 days of treatment.   Otherwise, the patient has been stable and is ready for discharge.      Lonia Blood, M.D.  Electronically Signed     LG/MEDQ  D:  10/12/2005  T:  10/12/2005  Job:  [id number]

## 2010-07-08 NOTE — Discharge Summary (Signed)
NAME:  Bryce Mcdonald, Bryce Mcdonald             ACCOUNT NO.:  [id number]   MEDICAL RECORD NO.:  [id number]          PATIENT TYPE:  INP   LOCATION:  A314                          [facility]:  [facility]   PHYSICIAN:  Skeet Latch, DO    DATE OF BIRTH:  [birth year]   DATE OF ADMISSION:  01/03/2007  DATE OF DISCHARGE:  11/15/2008LH                               DISCHARGE SUMMARY   DISCHARGE DIAGNOSES:  1. Abdominal pain.  2. History of colon cancer.  3. Hypertension.  4. Prostate cancer.  5. History of deep venous thrombosis.   HOSPITAL COURSE:  This is an 77 year old, African American male who  presented to emergency room with chief complaint of abdominal pain of 2  days' duration.  The patient described her pain as constant in nature,  but not sharp and also associated some nausea and vomiting with this  pain.  The patient has a long history of colon cancer with multiple  abdominal surgeries secondary to his cancer.  The patient has been  followed at the [facility] in [place] for his cancer treatments.  Upon being seen  in the emergency room, his acute abdominal series showed a minimal ileus  with no obstruction.  A CT of the abdomen showed gallbladder dilatation  and pericholecystic fluids suggesting cholecystitis.  Also, this showed  a 0.5 cm, right, middle lobe pulmonary nodule and recommended a CT of  the chest in 6 months given his history of cancer.  Also, of note was a  small hiatal hernia, left renal cyst, prostatomegaly, ventral hernia and  postop changes.  The patient was seen, evaluated and admitted to general  medical bed and placed on his home medications as well as antiemetics  and  IV pain medication.  The patient had an ultrasound of his abdomen  also performed which showed cholelithiasis with thickened gallbladder  wall and pericholecystic fluid with questionable acute cholecystitis as  well as a left renal cyst.  The patient was initially n.p.o. and placed  on clear liquids.  His diet was  advanced as tolerated.  On the second  day, the patient's pain subsided and the patient was discharged in  stable condition.   DISCHARGE MEDICATIONS:  1. Colace 100 mg twice daily.  2. Oxycodone one tablet p.o. p.r.n.  3. Simvastatin 40 mg daily.  4. Lisinopril 5 mg daily.  5. Metoprolol 50 mg twice a day.  6. Aspirin 81 mg daily.  7. Furosemide 40 mg daily.  8. Lovenox 80 mg subcu daily.  9. Protonix 40 mg daily.  10.Tylenol 650 mg q.4 h. p.r.n.  11.Milk of Magnesia daily as needed.  12.Ambien 5 mg p.r.n.   CONDITION ON DISCHARGE:  Stable.   DISPOSITION:  The patient was discharged to home.   DIET:  The patient is to maintain his previous diet.   ACTIVITY:  Increase his activity slowly.   FOLLOW UP:  The patient is to follow up with his primary care physician  in the next 1-2 weeks.  The patient was instructed to return to the  emergency room if he continued having any severe abdominal pain or  call  his primary care physician.   DISCHARGE LABORATORY DATA AND X-RAY FINDINGS:  Discharge sodium 138,  potassium 3.8, chloride is 102, CO2 32, glucose 111, BUN 9, creatinine  1.12.  White count 5.7, hemoglobin 12.3, hematocrit 35.9, platelet count  184.      Skeet Latch, DO  Electronically Signed     SM/MEDQ  D:  01/06/2007  T:  01/07/2007  Job:  [id number]

## 2010-07-08 NOTE — Group Therapy Note (Signed)
NAME:  Bryce Mcdonald, Bryce Mcdonald             ACCOUNT NO.:  [id number]   MEDICAL RECORD NO.:  [id number]          PATIENT TYPE:  INP   LOCATION:  A328                          [facility]   PHYSICIAN:  Margaretmary Dys, M.D.DATE OF BIRTH:  June 20, 1921   DATE OF PROCEDURE:  10/09/2005  DATE OF DISCHARGE:                                   PROGRESS NOTE   SUBJECTIVE:  The patient is feeling much better.  Sats his right lower  quadrant pain is gone.  Vomiting is also resolved.  He has no more abdominal  pain.  Basically, he had clear liquids this morning and did fairly well.   OBJECTIVE:  GENERAL:  Conscious, alert, comfortable, not in acute distress.  Well oriented to time, place, and person.  VITAL SIGNS:  Blood pressure 93/52, pulse 67, respirations 18, T max 97.5.  Oxygen saturation was 98% on room air.  HEENT:  Normocephalic and atraumatic.  Oral mucosa was moist.  No exudates.  NECK:  Supple.  No JVD.  LUNGS:  Clear clinically, good air entry bilaterally.  HEART:  S1 and S2 regular.  No S3, S4, gallops, or rubs.  ABDOMEN:  Soft, nontender.  Bowel sounds positive.  The patient does have a  colostomy bag in the right lower quadrant which is draining pretty well.  EXTREMITIES:  No edema or induration was noted.   LABORATORY/DIAGNOSTIC DATA:  White blood cell count 5.8, hemoglobin 13.1,  hematocrit 37.8, platelet count 185 with no left shift.  Sodium 128,  potassium 4.2, chloride 103, CO2 20, glucose 93, BUN 40, creatinine 1.6.  Calcium was 9.3.   CT of the abdomen and pelvis from yesterday showed postoperative changes in  the pelvis with no acute abdominal findings.  Stool culture is growing  Aeromonas which is reportedly sensitive to Cipro and Bactrim.  I am not sure  what the clinical significance of this is but will continue to watch the  patient.  If the patient does have diarrhea or any more vomiting, will start  him empirically on Cipro.  His stool WBC was also negative.   ASSESSMENT/PLAN:  1. Nausea, vomiting, abdominal pain.  The patient is doing much better      today.  Likely, possibly an acute enteritis, unclear if bacterial      versus viral.  Stool cultures are positive.  Will continue to watch      closely.  Will hydrate as needed.  Will start empirically on Cipro if      indicated.  2. Acute renal failure, prerenal azotemia.  The patient is better today.      Will increase his IV fluids.  Will replace electrolytes as needed.  3. Adjustment disorder versus depression.  Will continue to monitor.  The      patient denies any suicidal ideations.  4. Oropharyngeal candidiasis.  The patient is on Diflucan and continue.   DISPOSITION:  Overall, I think he is doing fairly well.  My only concern is  that his stool culture is growing Aeromonas.  Will look into the clinical  significance of this but will start him  empirically on Cipro if the patient  shows any signs of worsening diarrhea or abdominal symptoms.  I have  discussed the above plan with him and he verbalized full understanding.      Margaretmary Dys, M.D.  Electronically Signed     AM/MEDQ  D:  10/09/2005  T:  10/09/2005  Job:  [id number]

## 2010-07-08 NOTE — Discharge Summary (Signed)
NAME:  Bryce Mcdonald, Bryce Mcdonald NO.:  [id number]   MEDICAL RECORD NO.:  [id number]          PATIENT TYPE:  INP   LOCATION:  5010                         FACILITY:  [facility]   PHYSICIAN:  Nanetta Batty, M.D.   DATE OF BIRTH:  07-09-1921   DATE OF ADMISSION:  02/22/2006  DATE OF DISCHARGE:  03/07/2006                               DISCHARGE SUMMARY   DISCHARGE DIAGNOSES:  1. Acute respiratory failure, requiring intubation and mechanical      ventilation secondary to #2.  2. Acute pulmonary edema.  3. Chronic systolic heart failure.  4. Hypertension.  5. Chronic anemia.  6. Chronic renal insufficiency with renal failure in August 2007 that      resolved.  7. Recent diagnosis of deep venous thrombosis with subtherapeutic INR      on admission and elevated D-dimer but negative CT for pulmonary      embolism.  8. Carcinoma with a colectomy.  9. Gastroesophageal reflux disease.  10.History of small bowel obstruction.  11.Coronary artery disease with nonobstructive coronary disease.  12.Nonischemic cardiomyopathy with ejection fraction 20% to 25%.  13.Pericardial effusion.  14.Pulmonary nodules to be followed at the [facility].   DISCHARGE CONDITION:  Improved.   PROCEDURES:  Combined left heart catheterization, February 26, 2006, by  Dr. Nicki Guadalajara with a 20% left main and 20% and 50% diffuse disease  scattered throughout.   DISCHARGE MEDICATIONS:  1. Lasix 40 mg daily.  2. Lisinopril 10 mg daily.  3. Aspirin 81 daily.  4. Coumadin 15 mg Wednesday's and Saturday's, 10 mg other days.  5. Magnesium oxide 400 mg daily.  6. Protonix 40 mg daily.  7. Metoprolol 50 mg twice daily.  8. Zocor 40 mg daily.   DISCHARGE INSTRUCTIONS:  1. Increase activities slowly.  May shower and bathe.  2. No driving for 1 week.  3. Low-sodium, heart-healthy diet.  4. For groin pain, call [facility].  5. Followup with [facility], March 09, 2006.  He already has that      appointment.  6. Have lab work done at [facility] on Friday.   HISTORY OF PRESENT ILLNESS:  An 77 year old, African-American male  presented to [facility] with acute respiratory distress.  His  family found him significantly short of breath on the bathroom floor.  EMS was called.  He was taken emergently to [facility], intubated, and  given IV Lasix.  He came to [facility] for further management and  developed hypotension, which was treated with IV fluids, which improved  him.  He was found to have non-therapeutic INR with a history of a DVT  in his right leg in November.  We did do a CT.  It was negative for  pulmonary embolus.  He did have an elevated D-dimer.  He was on heparin  throughout this hospitalization until he was on Lovenox, Coumadin  crossover.  We had critical care medicine consult with [place] as well.  He  maintained and eventually was weaned of the ventilator and continued to  improve fairly rapidly.  He has fever but a  chest x-ray showed no  pneumonia.  We had him on empiric antibiotics initially and those were  discontinued.  He continued on p.o. Lasix.  He had significant  hypokalemia and was treated numerous days and, once his Lasix was  changed to p.r.n. instead of IV, he was put on magnesium supplement.  His potassium leveled out and remained therapeutic.  Plans were for  cardiac catheterization, which he underwent on February 26, 2006, with the  results as stated.   The patient continued to improve; nonischemic cardiomyopathy, chronic  systolic heart failure, severe LV dysfunction.  He had a moderate  pericardial effusion and on echo it appeared tamponade physiology but,  clinically, no tamponade.  He continued to be stable and he was  transferred to a non-telemetry floor and is being treated medically for  his nonischemic cardiomyopathy.  He was on Lovenox, Coumadin crossover  and on March 07, 2006, his INR was therapeutic at 2.  He will be   discharged home as previously stated.   The patient requested that [facility] follow his pro times.  Dr. Allyson Sabal  will see him back in followup in 1-2 weeks and then he will follow up  with Dr. Birder Robson at [facility] on March 09, 2006.      Darcella Gasman. Annie Paras, N.P.      Nanetta Batty, M.D.  Electronically Signed    LRI/MEDQ  D:  03/07/2006  T:  03/07/2006  Job:  [id number]   cc:   Nanetta Batty, M.D.  [facility]

## 2010-07-08 NOTE — Discharge Summary (Signed)
NAME:  Bryce Mcdonald, Bryce Mcdonald NO.:  [id number]   MEDICAL RECORD NO.:  [id number]          PATIENT TYPE:  INP   LOCATION:  A332                          FACILITY:  [facility]   PHYSICIAN:  Dalia Heading, M.D.  DATE OF BIRTH:  1921/11/10   DATE OF ADMISSION:  08/22/2005  DATE OF DISCHARGE:  07/06/2007LH                                 DISCHARGE SUMMARY   HOSPITAL COURSE SUMMARY:  The patient is an 77 year old black male, status  post a partial colectomy with ileostomy at the [facility] in [place]  approximately two weeks ago, who had been discharged approximately four days  prior to presenting to [facility] with nausea and vomiting.  [facility] and stated they could not [facility] their patient because they had no  beds.  His abdominal films revealed a partial small-bowel obstruction versus  ileus.  There was no intra-abdominal abscess seen on CT scan of the abdomen  and pelvis.  There was a small amount of free air and inflammation around a  pelvic anastomosis.  It is presumed that the patient had diverting ileostomy  to protect the anastomosis in the pelvis.  He was also noted to have a small  fascial dehiscence, though the wound was intact.  He was admitted to the  hospital for further evaluation and treatment.  He was started on Tygacil  empirically.  The following day, his abdomen seemed softer, and he had  significant ileostomy output.  His diet was thus advanced without  difficulty.  He is being discharged home on August 25, 2005 in fair and stable  condition.   DISCHARGE INSTRUCTIONS:  The patient is to follow up at the [facility] in [place] as previously scheduled.  He is to resume all his previous  medications as prescribed.   PRINCIPAL DIAGNOSES:  1.  Partial small-bowel obstruction, resolved.  2.  Fascial dehiscence.  3.  Status post partial colectomy with ileostomy.  4.  Hypertension.   PRINCIPAL PROCEDURES:  None.      Dalia Heading,  M.D.  Electronically Signed     MAJ/MEDQ  D:  08/25/2005  T:  08/25/2005  Job:  [id number]

## 2010-07-08 NOTE — Consult Note (Signed)
NAMEMarland Kitchen  OHN, BOSTIC             ACCOUNT NO.:  [id number]   MEDICAL RECORD NO.:  [id number]          PATIENT TYPE:  INP   LOCATION:  2106                         FACILITY:  [facility]   PHYSICIAN:  Coralyn Helling, MD        DATE OF BIRTH:  10/28/1921   DATE OF CONSULTATION:  02/22/2006  DATE OF DISCHARGE:                                 CONSULTATION   REASON FOR CONSULTATION:  Acute respiratory failure.   Mr. Maler is an 77 year old male who had presented to [facility] with acute respiratory distress.  He was noted to be  tachycardiac and hypertensive, and there is question as to if he  actually had atrial fibrillation on an EKG.  He was found to have  pulmonary edema on his chest x-ray and was subsequently intubated.  He  was then given intravenous Lasix, with significant diuresis.  He was  then transferred to [facility] intensive care unit for further  evaluation and treatment of this. Since being transferred here, he did  have an episode of hypotension which seemed to respond to intravenous  fluid.  He also was recently diagnosed with a DVT in his right leg and  was started on Coumadin therapy for this, although his INR from this  morning was 1.1.   PAST MEDICAL HISTORY:  1. Right leg DVT.  2. Colon carcinoma, status post colectomy, and there is been a report      in the medical record that he would not be a candidate for further      chemotherapy.  3. He has had chronic anemia.  4. Gastroesophageal reflux disease.  5. Thrush.  6. Small bowel obstruction.  7. Hypertension.   He has NO KNOWN DRUG ALLERGIES.   MEDICATIONS:  Coumadin and Colace.   SOCIAL HISTORY:  He lives with his son.  There is no recent history of  tobacco or alcohol use, although he did used to smoke cigarettes  previously.   FAMILY HISTORY:  Noncontributory.   PHYSICAL EXAMINATION:  VITAL SIGNS:  His heart rate was 156, blood  pressure was 216/120, respiratory rate was 18, oxygen saturation  was  100%, temperature was 97.  This was all in the emergency room at [facility].  HEENT:  Pupils reactive.  He had an endotracheal tube in place.  NECK:  There was no lymphadenopathy.  HEART:  S1, S2, regular rate and rhythm.  CHEST:  There was no wheezing or rales.  ABDOMEN:  Thin, soft, nontender, positive bowel sounds.  EXTREMITIES:  There was no edema, cyanosis, or clubbing.  NEUROLOGIC:  He was able to open his eyes and move his extremities with  stimulation.   Chest x-ray shows bilateral air space disease, suggestive of pulmonary  edema.  WBC 7, hemoglobin 10.6, hematocrit 32.3, platelet count is 298.  Sodium is 141, potassium 3.3, chloride 105, CO2 24, BUN 15, creatinine  is 1.3, glucose 150.  PT 14.5, INR 1.1, PTT 29.  AST 142, ALT 81, ALP  120, bilirubin 0.5, albumin 0.9, calcium is 8.8, magnesium 1.7.  BNP was  330,  D-dimer 6.99.  TSH is pending.   IMPRESSION:  Acute respiratory failure requiring intubation and  mechanical ventilation.  On review of the chest x-ray, it appears that  he does have pulmonary edema. I am concerned that he had a hypertensive  emergency with acute cardiac strain causing development of his pulmonary  edema.  He does carry a previous diagnosis of hypertension, according to  his medical records. but it does not appear that he was on any  antihypertensive therapy for this.  I will adjust his ventilator  settings, as he does appear to require less supplemental oxygen now  after diuresis.  He may need to have a central venous line placed to  monitor his CVP to assist with fluid management. He also was recently  diagnosed with DVT, although he has a subtherapeutic INR, raising the  concern that he could have also had a pulmonary embolism.  He has been  started on heparin, and he is scheduled to undergo a CT scan of his  chest to rule out pulmonary embolism.  He has also been started on  antibiotics, although given the his clinical scenario as well as  his  radiographic finding, in addition to the fact that he does not have a  significant elevation in his temperature and his white count, an  infectious process would be less likely. To further assess this, I will  have him undergo a BAL.  Then, as his clinical status improves, I would  begin to wean him from ventilatory support.      Coralyn Helling, MD  Electronically Signed     VS/MEDQ  D:  02/22/2006  T:  02/22/2006  Job:  [id number]

## 2010-07-08 NOTE — H&P (Signed)
NAME:  Bryce Mcdonald, Bryce Mcdonald             ACCOUNT NO.:  [id number]   MEDICAL RECORD NO.:  [id number]          PATIENT TYPE:  INP   LOCATION:  A332                          FACILITY:  [facility]   PHYSICIAN:  Dalia Heading, M.D.  DATE OF BIRTH:  [birth year]   DATE OF ADMISSION:  08/22/2005  DATE OF DISCHARGE:  LH                                HISTORY & PHYSICAL   AGE:  77 years old.   CHIEF COMPLAINT:  Abdominal distention.   HISTORY OF PRESENT ILLNESS:  Patient is an 77 year old black male status  post a partial colectomy with ileostomy two weeks ago at the [facility] in  [place] who now presents with nausea and abdominal distention.  He was  discharged from the [place] approximately four days ago.  He states his surgery  was for colon cancer and they did an ileostomy to protect the remaining  colon.  He was treated in the emergency room several days ago for a urinary  tract infection.  He has been on ciprofloxacin since that time.  He started  having worsening pain and abdominal distention yesterday evening and now  comes to the emergency room for further evaluation and treatment.   PAST MEDICAL HISTORY:  1.  Colon cancer.  2.  Hypertension.   PAST SURGICAL HISTORY:  As noted above.   CURRENT MEDICATIONS:  Ciprofloxacin, oxycodone, Zantac, diltiazem, Dulcolax,  aspirin.   ALLERGIES:  No known drug allergies.   REVIEW OF SYSTEMS:  Noncontributory.   PHYSICAL EXAMINATION:  GENERAL:  The patient is a well-developed, well-  nourished black male in no acute distress.  VITAL SIGNS:  He is afebrile and vital signs are stable.  LUNGS:  Clear to auscultation with equal breath sounds bilaterally.  HEART:  Regular rate and rhythm without S3, S4, or murmurs.  ABDOMEN:  Midline surgical scar with a small amount of fluid and possible  fascial dehiscence in the periumbilical region.  Staples are still in place.  A right-sided ileostomy is present with some protrusion of the ileostomy.  There is air  and fluid in the ileostomy bag.  The abdomen is somewhat  distended, but not tense.  Minimal erythema is noted around the midline  incision.  No hepatosplenomegaly or masses are noted.  RECTAL:  Deferred at this time.   White blood cell count 14.3, hematocrit 38, platelet count 366 with 89 segs,  6 lymphs, no bands.  MET-7 is remarkable for sodium 130, BUN 24, creatinine  1.   CT scan of the abdomen and pelvis reveals some proximal dilated small bowel  loops with some wall thickening.  It returns to normal caliber just before  the ileostomy.  The midline surgical incision shows some non-enhancing fluid  collection just underneath the skin.  The fascia may not be intact in the  periumbilical region.  No significant free fluid is noted in the pelvis.  There appears to be an anastomotic area in the rectum with inflammation and  several spots of free air surrounding it.  No intra-abdominal abscess is  noted.   IMPRESSION:  Leukocytosis and small bowel swelling  status post partial  colectomy with protective ileostomy at another facility.   PLAN:  The patient will be admitted to the hospital for intravenous  antibiotic therapy and intravenous hydration.  He is refusing a nasogastric  tube at this time which is fine with me.  He will be started on clear liquid  diet.  His wound will be watched closely for infection.  I do not feel acute  surgical intervention or operative intervention is warranted at this time.  This was explained to the family who agreed to the treatment plan.  The [facility] has been contacted but they could not accept transfer the  patient as there were no beds available.  Patient will be started on Tygacil  for antibiotic therapy.      Dalia Heading, M.D.  Electronically Signed     MAJ/MEDQ  D:  08/22/2005  T:  08/22/2005  Job:  [id number]

## 2010-07-08 NOTE — H&P (Signed)
NAME:  Bryce Mcdonald, Bryce Mcdonald             ACCOUNT NO.:  [id number]   MEDICAL RECORD NO.:  [id number]          PATIENT TYPE:  INP   LOCATION:  A217                          FACILITY:  [facility]   PHYSICIAN:  Lonia Blood, M.D.      DATE OF BIRTH:  1921/10/03   DATE OF ADMISSION:  01/19/2006  DATE OF DISCHARGE:  LH                              HISTORY & PHYSICAL   PRIMARY CARE PHYSICIAN:  [place].   PRESENTING COMPLAINT:  Bilateral leg pain, right more than left.   HISTORY OF PRESENT ILLNESS:  The patient is an 77 year old gentleman  with known history of colon cancer, status post partial resection at  [facility], who has been doing fairly alright since his last discharge in  August of this year.  He came in this time around with lower extremity  swelling and pain; the right is worse than the left.  His initial  evaluation in the ED was consistent with possible DVT, although Dopplers  have not been done yet.  Due to his high-risk factors for deep venous  thrombosis, the patient is being admitted for further workup.   PAST MEDICAL HISTORY:  1. Colon cancer, status post partial resection.  2. History of renal failure in August 2007, which has resolved.  3. Chronic diarrheal disease.  4. Chronic anemia.  5. History of oral thrush.  6. GERD.   ALLERGIES:  The patient has no known drug allergies.   MEDICATIONS:  Only stool softeners, taking Colace.   SOCIAL HISTORY:  The patient lives with his son.  He has up to 8  children.  One of his children had colon cancer as well.  The patient  had 8 siblings and he is 3rd of 9 children.  Multiple family members  have different types of cancer.  He is fairly independent.  He denies  any tobacco or alcohol use.   FAMILY HISTORY:  Not significant.   PHYSICAL EXAMINATION:  VITAL SIGNS:  On exam in the ED, his temperature  was 97.6, blood pressure 135/66, pulse 78, respiratory rate 20, SATs  100% on room air.  GENERAL:  The patient is pleasant, awake,  alert, oriented, in no acute  distress.  HEENT:  PERRL.  EOMI.  NECK:  Supple with no JVD, no lymphadenopathy.  RESPIRATORY:  He has good air entry bilaterally.  No wheezes.  No rales.  CARDIOVASCULAR:  He has an S1 and S2.  No murmurs.  ABDOMEN:  Soft and nontender with positive bowel sounds.  EXTREMITIES:  No edema, cyanosis or clubbing.  RECTAL:  Guaiac is essentially negative.   LABORATORY DATA:  His labs showed a white count of 3.6, hemoglobin 9.5  with an MCV of 100, platelet count 256,000 with normal differential.  His PT is 13.9, INR 1.1, PTT 32.  Sodium 143, potassium 3.3, chloride  107, CO2 30, glucose 106, BUN 11, creatinine 1.1, calcium 8.8.  His D-  dimer was 10.74.   Dopplers are currently pending.   ASSESSMENT:  This is an 77 year old gentleman presenting with right  lower extremity swelling and pain.  He has  a markedly elevated D-dimer.  Currently, Doppler ultrasound is being planned, but findings are most  likely consistent with deep venous thrombosis.   PLAN:  1. Possible right lower extremity DVT:  We will start empiric      treatment with Lovenox, due to the patient's history of colon      cancer and relative immobility.  I will hold off on the Coumadin      yet until we get Doppler ultrasound and if it is confirmed that the      patient has DVT, then we will start him on Coumadin as well.  The      patient may have to be on that permanently due to his tumorigenic      nature.  Subsequently, we will discharge him on Coumadin,      hopefully.  2. Microcytic anemia:  The patient will require workup, checking B12      and folate, especially as he had partial colon resection.  It is      not clear if this has affected his absorption with B12 and folate.  3. Hypokalemia:  The patient's potassium is slightly low and we will      replete it and we will follow it closely.   Otherwise, the main issue will be anticoagulation and working him up for  possible DVT.  He  denied any shortness of breath and his review of  systems is essentially negative, except for HPI; hence, we will proceed  with anticoagulation at this point.      Lonia Blood, M.D.  Electronically Signed     LG/MEDQ  D:  01/20/2006  T:  01/20/2006  Job:  [id number]

## 2010-07-08 NOTE — Cardiovascular Report (Signed)
NAME:  MARQUEST, GUNKEL NO.:  [id number]   MEDICAL RECORD NO.:  [id number]          PATIENT TYPE:  INP   LOCATION:  2013                         FACILITY:  [facility]   PHYSICIAN:  Nicki Guadalajara, M.D.     DATE OF BIRTH:  [birth year]   DATE OF PROCEDURE:  DATE OF DISCHARGE:                            CARDIAC CATHETERIZATION   HISTORY:  Mr. Ajai Harville is an 77 year old African-American male  who had initially presented to [facility] with respiratory  distress, tachycardic and hypertensive.  He was transferred to [facility].  His symptoms improved.  He also was noticed to have  paroxysmal atrial fibrillation.  Apparently an echo had suggested an EF  of 20-30% and anteroseptal apical akinesis.  He is referred for  definitive diagnostic cardiac catheterization.   PROCEDURE:  After premedication with Valium 4 mg intravenously the  patient prepped and draped in usual fashion.  His right femoral artery  was punctured anteriorly and a 5-French sheath was inserted.  Diagnostic  catheterization was done utilizing 5-French Judkins for left and right  coronary catheters.  A pigtail catheter was used for biplane  cineangiography as well as distal aortography.  Hemostasis obtained by  direct manual pressure.  The patient tolerated the procedure well and  returned to his room in satisfactory condition.   HEMODYNAMIC DATA:  Central aortic pressure is 125/54.  Left ventricular  pressure is 125/17.   Angiographic data:  Left main coronary artery had mild 20% ostial taper  and otherwise was normal and bifurcated into an LAD and left circumflex  system.   The LAD gave rise to a proximal diagonal vessel.  Between the 1st and  2nd diagonal vessel was mild luminal irregularity of 20%.  After the 2nd  diagonal vessel there was mild luminal irregularity of 20-30% in the mid  LAD segment.  The LAD did extend and wrap around the LV apex.   The circumflex vessel was  moderate-sized vessel, gave rise to a small  1st marginal branch.  The 2nd marginal branch was moderate size and had  diffuse smooth 50% narrowing in this marginal vessel.   The right coronary artery was moderate-sized, somewhat tortuous vessel  that had mild 20% narrowing prior to the crux.  The RCA supplied the PDA  and PLA vessel.   Biplane cineangiography revealed preserved global contractility with an  ejection fraction of 50-55%.  There was left ventricular hypertrophy.  There was a suggestion of a minimal apical inferior hypocontractility on  the RAO projection.  On the LAO projection there was subtle focal apical  septal hypocontractility.   Distal aortography did not demonstrate any renal artery stenosis.  There  was no significant aortoiliac disease.   IMPRESSION:  1. Low normal left ventricular function with an ejection fraction of      50-55% with left ventricular hypertrophy and subtle focal apical      septal, apical inferior hypocontractility.  2. Mild to moderate coronary obstructive disease with 20% left main      ostial tapering, 20 and 30% proximal and  mid LAD stenoses, diffuse 50% narrowing in the circumflex marginal      vessel and 20% narrowing in the right coronary artery.   RECOMMENDATIONS:  Medical therapy.           ______________________________  Nicki Guadalajara, M.D.     TK/MEDQ  D:  02/26/2006  T:  02/26/2006  Job:  [id number]   cc:   Nanetta Batty, M.D.  [facility]

## 2010-10-16 ENCOUNTER — Other Ambulatory Visit: Payer: Self-pay

## 2010-10-16 ENCOUNTER — Inpatient Hospital Stay (HOSPITAL_COMMUNITY)
Admission: EM | Admit: 2010-10-16 | Discharge: 2010-10-17 | DRG: 690 | Disposition: A | Discharge status: Home / Self Care | Payer: Non-veteran care | Attending: Internal Medicine | Admitting: Internal Medicine

## 2010-10-16 ENCOUNTER — Emergency Department (HOSPITAL_COMMUNITY): Payer: Non-veteran care

## 2010-10-16 DIAGNOSIS — C61 Malignant neoplasm of prostate: Secondary | ICD-10-CM | POA: Diagnosis present

## 2010-10-16 DIAGNOSIS — N390 Urinary tract infection, site not specified: Secondary | ICD-10-CM | POA: Diagnosis present

## 2010-10-16 DIAGNOSIS — R531 Weakness: Secondary | ICD-10-CM

## 2010-10-16 DIAGNOSIS — Z85038 Personal history of other malignant neoplasm of large intestine: Secondary | ICD-10-CM

## 2010-10-16 DIAGNOSIS — K5900 Constipation, unspecified: Secondary | ICD-10-CM | POA: Diagnosis present

## 2010-10-16 DIAGNOSIS — K439 Ventral hernia without obstruction or gangrene: Secondary | ICD-10-CM | POA: Diagnosis present

## 2010-10-16 DIAGNOSIS — E785 Hyperlipidemia, unspecified: Secondary | ICD-10-CM | POA: Diagnosis present

## 2010-10-16 DIAGNOSIS — I2510 Atherosclerotic heart disease of native coronary artery without angina pectoris: Secondary | ICD-10-CM | POA: Diagnosis present

## 2010-10-16 DIAGNOSIS — E860 Dehydration: Secondary | ICD-10-CM

## 2010-10-16 DIAGNOSIS — C189 Malignant neoplasm of colon, unspecified: Secondary | ICD-10-CM

## 2010-10-16 DIAGNOSIS — I10 Essential (primary) hypertension: Secondary | ICD-10-CM | POA: Diagnosis present

## 2010-10-16 HISTORY — DX: Pure hypercholesterolemia, unspecified: E78.00

## 2010-10-16 HISTORY — DX: Essential (primary) hypertension: I10

## 2010-10-16 HISTORY — DX: Acute embolism and thrombosis of unspecified deep veins of unspecified lower extremity: I82.409

## 2010-10-16 HISTORY — DX: Other pulmonary embolism without acute cor pulmonale: I26.99

## 2010-10-16 HISTORY — DX: Reserved for inherently not codable concepts without codable children: IMO0001

## 2010-10-16 HISTORY — DX: Encounter for other specified aftercare: Z51.89

## 2010-10-16 LAB — CBC
MCHC: 33.1 g/dL (ref 30.0–36.0)
Platelets: 161 10*3/uL (ref 150–400)
RDW: 13.6 % (ref 11.5–15.5)

## 2010-10-16 LAB — URINE MICROSCOPIC-ADD ON

## 2010-10-16 LAB — COMPREHENSIVE METABOLIC PANEL
ALT: 9 U/L (ref 0–53)
AST: 13 U/L (ref 0–37)
Albumin: 3.9 g/dL (ref 3.5–5.2)
Alkaline Phosphatase: 81 U/L (ref 39–117)
Potassium: 4.2 mEq/L (ref 3.5–5.1)
Sodium: 136 mEq/L (ref 135–145)
Total Protein: 7.4 g/dL (ref 6.0–8.3)

## 2010-10-16 LAB — URINALYSIS, ROUTINE W REFLEX MICROSCOPIC
Glucose, UA: NEGATIVE mg/dL
Leukocytes, UA: NEGATIVE
pH: 6 (ref 5.0–8.0)

## 2010-10-16 LAB — POCT I-STAT TROPONIN I

## 2010-10-16 LAB — PROTIME-INR: Prothrombin Time: 14.7 seconds (ref 11.6–15.2)

## 2010-10-16 MED ORDER — BIOTENE DRY MOUTH MT LIQD
Freq: Two times a day (BID) | OROMUCOSAL | Status: DC
  Administered 2010-10-16: 20:00:00 via OROMUCOSAL

## 2010-10-16 MED ORDER — SODIUM CHLORIDE 0.9 % IV BOLUS (SEPSIS)
750.0000 mL | Freq: Once | INTRAVENOUS | Status: AC
  Administered 2010-10-16: 15:00:00 via INTRAVENOUS

## 2010-10-16 MED ORDER — SODIUM CHLORIDE 0.9 % IV SOLN
INTRAVENOUS | Status: DC
  Administered 2010-10-16 – 2010-10-17 (×2): via INTRAVENOUS

## 2010-10-16 MED ORDER — ONDANSETRON HCL 4 MG/2ML IJ SOLN: 4.0000 mg | Freq: Four times a day (QID) | INTRAMUSCULAR | Status: DC | PRN

## 2010-10-16 MED ORDER — ACETAMINOPHEN 650 MG RE SUPP: 650.0000 mg | Freq: Four times a day (QID) | RECTAL | Status: DC | PRN

## 2010-10-16 MED ORDER — SIMVASTATIN 20 MG PO TABS
20.0000 mg | ORAL_TABLET | Freq: Every day | ORAL | Status: DC
  Administered 2010-10-16: 20 mg via ORAL
  Filled 2010-10-16: qty 1

## 2010-10-16 MED ORDER — SENNOSIDES-DOCUSATE SODIUM 8.6-50 MG PO TABS: 1.0000 | ORAL_TABLET | Freq: Every day | ORAL | Status: DC | PRN

## 2010-10-16 MED ORDER — CIPROFLOXACIN IN D5W 400 MG/200ML IV SOLN
INTRAVENOUS | Status: AC
  Filled 2010-10-16: qty 200

## 2010-10-16 MED ORDER — PANTOPRAZOLE SODIUM 40 MG PO TBEC
40.0000 mg | DELAYED_RELEASE_TABLET | Freq: Every day | ORAL | Status: DC
  Administered 2010-10-16: 40 mg via ORAL
  Filled 2010-10-16: qty 1

## 2010-10-16 MED ORDER — METOPROLOL TARTRATE 50 MG PO TABS
50.0000 mg | ORAL_TABLET | Freq: Two times a day (BID) | ORAL | Status: DC
  Administered 2010-10-16: 50 mg via ORAL
  Filled 2010-10-16: qty 1

## 2010-10-16 MED ORDER — DEXTROSE 5 % IV SOLN
1.0000 g | Freq: Once | INTRAVENOUS | Status: AC
  Administered 2010-10-16: 1 g via INTRAVENOUS
  Filled 2010-10-16: qty 1

## 2010-10-16 MED ORDER — ZOLPIDEM TARTRATE 5 MG PO TABS: 5.0000 mg | ORAL_TABLET | Freq: Every evening | ORAL | Status: DC | PRN

## 2010-10-16 MED ORDER — MORPHINE SULFATE 2 MG/ML IJ SOLN: 2.0000 mg | INTRAMUSCULAR | Status: DC | PRN

## 2010-10-16 MED ORDER — ACETAMINOPHEN 325 MG PO TABS: 650.0000 mg | ORAL_TABLET | Freq: Four times a day (QID) | ORAL | Status: DC | PRN

## 2010-10-16 MED ORDER — CIPROFLOXACIN IN D5W 400 MG/200ML IV SOLN
400.0000 mg | Freq: Two times a day (BID) | INTRAVENOUS | Status: DC
  Administered 2010-10-16 – 2010-10-17 (×2): 400 mg via INTRAVENOUS
  Filled 2010-10-16 (×2): qty 200

## 2010-10-16 MED ORDER — ENOXAPARIN SODIUM 80 MG/0.8ML ~~LOC~~ SOLN: 80.0000 mg | SUBCUTANEOUS | Status: DC

## 2010-10-16 MED ORDER — ONDANSETRON HCL 4 MG PO TABS: 4.0000 mg | ORAL_TABLET | Freq: Four times a day (QID) | ORAL | Status: DC | PRN

## 2010-10-16 MED ORDER — SODIUM CHLORIDE 0.9 % IV SOLN: INTRAVENOUS | Status: DC

## 2010-10-16 NOTE — H&P (Signed)
Colburn Asper MRN: [id number] DOB/AGE: 06-01-1921 77 y.o.  Admit date: 10/16/2010 Chief Complaint: Lower abdominal pain and weakness. HPI: This 77 year old veteran gives a 2 week history of the above complaints. This morning but lower abdominal pain became so severe that he came to the emergency room. He also was unable to walk with a walker that he has at home. Evaluation by the emergency room physician showed that he likely has a UTI and is mildly dehydrated. He does have a previous history of prostate cancer for which he is taking hormone injections every 3 months. There is no nausea, vomiting. He does describe feeling feverish. He denies any urinary symptoms. He denies any back pain.    Past medical history: 1. Colon cancer with surgery, the details of this are unclear. 2. Prostate cancer, details unclear. 3 monthly hormone injections. 3. Mild to moderate coronary artery disease per cardiac catheterization in January 2008 showing normal left ventricular systolic function. The recommendation was for medical therapy only. 4. Hypertension. 5. Hyperlipidemia. 6. Ventral hernia, stable.      Family history: Noncontributory.  Social history: He is widowed for the last 10 years. He lives alone. He does not smoke cigarettes. He does not drink alcohol. He is retired. He is a World War [employer] veteran.  Allergies: No Known Allergies  Medications Prior to Admission  Medication Dose Route Frequency Provider Last Rate Last Dose  . 0.9 %  sodium chloride infusion   Intravenous Continuous Ward Givens, MD      . cefTRIAXone (ROCEPHIN) 1 g in dextrose 5 % 50 mL IVPB  1 g Intravenous Once Ward Givens, MD 100 mL/hr at 10/16/10 1452 1 g at 10/16/10 1452  . sodium chloride 0.9 % bolus 750 mL  750 mL Intravenous Once Ward Givens, MD       No current outpatient prescriptions on file as of 10/16/2010.       YQM:VHQIO from the symptoms mentioned above,there are no other symptoms referable to all systems  reviewed.  Physical Exam: Blood pressure 112/69, pulse 67, temperature 98.1 F (36.7 C), temperature source Oral, resp. rate 21, height 5\' 8"  (1.727 m), weight 86.183 kg (190 lb), SpO2 98.00%. He looks systemically well and is not toxic or septic. He is slightly clinically dehydrated. Abdomen is slightly distended with evidence of a ventral hernia. There is a midline laparotomy scar. There is no tenderness. There is no hepatosplenomegaly. There are no masses felt. Bowel sounds are heard and are normal. Rectal examination was not done. Cardiovascular: Heart sounds are present and normal without murmurs. Respiratory: Lung fields are clear. Neurological: Alert and orientated without any focal neurological signs. Musculoskeletal: No abnormalities. Skin: No abnormalities.  Results for orders placed during the hospital encounter of 10/16/10 (from the past 48 hour(s))  CBC     Status: Abnormal   Collection Time   10/16/10 11:43 AM      Component Value Range Comment   WBC 7.6  4.0 - 10.5 (K/uL)    RBC 3.63 (*) 4.22 - 5.81 (MIL/uL)    Hemoglobin 11.7 (*) 13.0 - 17.0 (g/dL)    HCT 96.2 (*) 95.2 - 52.0 (%)    MCV 97.5  78.0 - 100.0 (fL)    MCH 32.2  26.0 - 34.0 (pg)    MCHC 33.1  30.0 - 36.0 (g/dL)    RDW 84.1  32.4 - 40.1 (%)    Platelets 161  150 - 400 (K/uL)   COMPREHENSIVE METABOLIC PANEL  Status: Abnormal   Collection Time   10/16/10 11:43 AM      Component Value Range Comment   Sodium 136  135 - 145 (mEq/L)    Potassium 4.2  3.5 - 5.1 (mEq/L)    Chloride 99  96 - 112 (mEq/L)    CO2 27  19 - 32 (mEq/L)    Glucose, Bld 117 (*) 70 - 99 (mg/dL)    BUN 13  6 - 23 (mg/dL)    Creatinine, Ser 1.61  0.50 - 1.35 (mg/dL)    Calcium 09.6  8.4 - 10.5 (mg/dL)    Total Protein 7.4  6.0 - 8.3 (g/dL)    Albumin 3.9  3.5 - 5.2 (g/dL)    AST 13  0 - 37 (U/L)    ALT 9  0 - 53 (U/L)    Alkaline Phosphatase 81  39 - 117 (U/L)    Total Bilirubin 0.6  0.3 - 1.2 (mg/dL)    GFR calc non Af Amer 57 (*) >60  (mL/min)    GFR calc Af Amer >60  >60 (mL/min)   PRO B NATRIURETIC PEPTIDE     Status: Normal   Collection Time   10/16/10 11:43 AM      Component Value Range Comment   BNP, POC 423.3  0 - 450 (pg/mL)   APTT     Status: Abnormal   Collection Time   10/16/10 11:43 AM      Component Value Range Comment   aPTT 45 (*) 24 - 37 (seconds)   PROTIME-INR     Status: Normal   Collection Time   10/16/10 11:43 AM      Component Value Range Comment   Prothrombin Time 14.7  11.6 - 15.2 (seconds)    INR 1.13  0.00 - 1.49    CULTURE, BLOOD (ROUTINE X 2)     Status: Normal (Preliminary result)   Collection Time   10/16/10 11:45 AM      Component Value Range Comment   Specimen Description Blood RIGHT HAND      Special Requests        Value: BOTTLES DRAWN AEROBIC AND ANAEROBIC 4CC EACH BOTTLE   Culture PENDING      Report Status PENDING     CULTURE, BLOOD (ROUTINE X 2)     Status: Normal (Preliminary result)   Collection Time   10/16/10 12:00 PM      Component Value Range Comment   Specimen Description Blood RIGHT ARM      Special Requests        Value: BOTTLES DRAWN AEROBIC AND ANAEROBIC 4CC EACH BOTTLE   Culture PENDING      Report Status PENDING     POCT I-STAT TROPONIN I     Status: Normal   Collection Time   10/16/10 12:07 PM      Component Value Range Comment   Troponin i, poc 0.06  0.00 - 0.08 (ng/mL)    Comment 3            URINALYSIS, ROUTINE W REFLEX MICROSCOPIC     Status: Abnormal   Collection Time   10/16/10  1:09 PM      Component Value Range Comment   Color, Urine YELLOW  YELLOW     Appearance HAZY (*) CLEAR     Specific Gravity, Urine >1.030 (*) 1.005 - 1.030     pH 6.0  5.0 - 8.0     Glucose, UA NEGATIVE  NEGATIVE (mg/dL)  Hgb urine dipstick NEGATIVE  NEGATIVE     Bilirubin Urine MODERATE (*) NEGATIVE     Ketones, ur TRACE (*) NEGATIVE (mg/dL)    Protein, ur 30 (*) NEGATIVE (mg/dL)    Urobilinogen, UA 1.0  0.0 - 1.0 (mg/dL)    Nitrite NEGATIVE  NEGATIVE      Leukocytes, UA NEGATIVE  NEGATIVE    URINE MICROSCOPIC-ADD ON     Status: Abnormal   Collection Time   10/16/10  1:09 PM      Component Value Range Comment   Squamous Epithelial / LPF FEW (*) RARE     WBC, UA 7-10  <3 (WBC/hpf)    Bacteria, UA MANY (*) RARE     Casts HYALINE CASTS (*) NEGATIVE     Recent Results (from the past 240 hour(s))  CULTURE, BLOOD (ROUTINE X 2)     Status: Normal (Preliminary result)   Collection Time   10/16/10 11:45 AM      Component Value Range Status Comment   Specimen Description Blood RIGHT HAND   Final    Special Requests     Final    Value: BOTTLES DRAWN AEROBIC AND ANAEROBIC 4CC EACH BOTTLE   Culture PENDING   Incomplete    Report Status PENDING   Incomplete   CULTURE, BLOOD (ROUTINE X 2)     Status: Normal (Preliminary result)   Collection Time   10/16/10 12:00 PM      Component Value Range Status Comment   Specimen Description Blood RIGHT ARM   Final    Special Requests     Final    Value: BOTTLES DRAWN AEROBIC AND ANAEROBIC 4CC EACH BOTTLE   Culture PENDING   Incomplete    Report Status PENDING   Incomplete     Dg Chest 2 View  10/16/2010  *RADIOLOGY REPORT*  Clinical Data: Shortness of breath.  Weakness.  Abdominal pain.  CHEST - 2 VIEW  Comparison: 02/25/2006.  Findings: Normal sized heart.  Stable pleural and parenchymal scarring at both lung bases.  The lungs remain hyperexpanded.  Skin fold overlying the anterior clear space on the lateral view. Diffuse osteopenia.  IMPRESSION: Stable mild changes of COPD and bibasilar scarring.  No acute abnormality.  Original Report Authenticated By: Darrol Angel, M.D.   Ct Head Wo Contrast  10/16/2010  *RADIOLOGY REPORT*  Clinical Data: Headache and weakness.  CT HEAD WITHOUT CONTRAST  Technique:  Contiguous axial images were obtained from the base of the skull through the vertex without contrast.  Comparison: None.  Findings: There is no evidence for acute hemorrhage, hydrocephalus, mass lesion, or  abnormal extra-axial fluid collection.  No definite CT evidence for acute infarction.  The visualized paranasal sinuses and mastoid air cells are clear.  IMPRESSION: No acute intracranial abnormality.  Original Report Authenticated By: ERIC A. MANSELL, M.D.   Dg Abd 2 Views  10/16/2010  *RADIOLOGY REPORT*  Clinical Data: Abdominal pain.  Weakness.  ABDOMEN - 2 VIEW  Comparison: Chest radiographs obtained at the same time.  Findings: Normal bowel gas pattern without free peritoneal air. Surgical clips in the left upper and right lower abdomen.  Lumbar spine degenerative changes.  IMPRESSION: No acute abnormality.  Original Report Authenticated By: Darrol Angel, M.D.   Impression: 1. UTI. 2. Mild dehydration with generalized weakness. 3. Hypertension. 4. Prostate cancer. 5. Remote history of colon cancer. 6. Ventral hernia, stable.     Plan: 1. Admit. 2. Intravenous antibiotics. 3. Intravenous fluids. Further recommendations  will depend on patient's hospital progress.      Eleena Grater C 10/16/2010, 3:36 PM

## 2010-10-16 NOTE — ED Notes (Signed)
Pt reports has history of blood clots in legs and lungs.  Says is on a blood thinner shot daily.  Also c/o intermittent headaches, abd pain, worsening weakness in legs.  LBM was yesterday.

## 2010-10-16 NOTE — ED Notes (Signed)
Waiting for Dr. Randol Kern to come see pt.  Pt and family aware.

## 2010-10-16 NOTE — ED Notes (Signed)
Dr. Karilyn Cota here to see pt.

## 2010-10-16 NOTE — ED Notes (Signed)
Pt reports for the past 2 weeks legs have felt weak and has had pain in lower abd.  This morning was getting ready for church and legs "gave out."  Pt says did not fall, he sat down on the ground.  Denies any n/v/d.  Pt reports has cancer in "the bottom of his stomach."  Denies diarrhea.

## 2010-10-16 NOTE — ED Provider Notes (Addendum)
Scribed for Ward Givens, MD, the patient was seen in room APA18/APA18. This chart was scribed by [employer]. The patient's care started at 11:17  CSN: [id number] Arrival date & time: 10/16/2010 11:15 AM  Chief Complaint  Patient presents with  . Abdominal Pain   Patient is a 77 y.o. male presenting with extremity weakness. The history is provided by the patient.  Extremity Weakness Associated symptoms include abdominal pain, headaches and shortness of breath.   Bryce Mcdonald is a 77 y.o. male with a history of HTN, who presents to the Emergency Department complaining of Weakness. He reports that for the past 2 weeks, his legs have felt weak. Associated symptoms include throbbing headache, cough, shortness of breath and Abdominal Pain, onset 10/14/2010. States that headache is "behind both eyeballs" and reports chronic eyesight problems with recent changes from baseline due to headaches. He describes similar symptoms with a long history of headaches, for which he was seen at the [facility] hospital in [place]. Headaches are usually alleviated by tylenol with codeine. Reports yellowish sputum with cough and shortness of breath. He localizes Abdominal pain to the bliateral suprapubic area and describes pain as "coming and going" that lasts each time. Patient states that abdominal pain feels like gas and reports normal rectal gas passing. Has a surgical history for colon and prostate cancer. Patient denies dysuria, change in appetite or any recent tobacco use in the past 40 years. He reports more urination recently and loose bowel movements. Patient lives alone at home and ambulates with a walker. Patient is on blood thinners and states he gives himself shots in the abdomen every morning. There are no other associated symptoms and no other alleviating or aggravating factors.  PT has hx of DVT, PE. He states he is having pain in his legs and it seems to be chronic.  Pt states he was seen at the  [facility] in [place] about 2 weeks ago for bruising on the left side of his abd and was told everything was okay. Unsure of what tests were done. Pt has has had a normal appetite and has some nausea but denies vomtiing. Denies anything he does making his pains better or worse.  He denies fever or chills, but states he is having "hot flashes" and he gets a shot in his groin every 3 weeks at the [facility].  Past Medical History  Diagnosis Date  . Cancer   . Hypertension   . Hypercholesterolemia   DVT, PE, Colon cancer, prostate cancer MEDICATIONS:  Previous Medications   No medications on file   simvastin, metoprolol, HCTZ, omeprazole, tylenol/codeine, lovenox  ALLERGIES:  Allergies as of 10/16/2010  . (No Known Allergies)      Past Surgical History  Procedure Date  . Abdominal surgery   ? Colon resection for colon cancer  No family history on file.  History  Substance Use Topics  . Smoking status: Never Smoker   . Smokeless tobacco: Not on file  . Alcohol Use: No  PT lives home alone, uses a walker to get around    Review of Systems  HENT: Negative for sore throat and rhinorrhea.   Eyes: Positive for visual disturbance. Negative for photophobia.  Respiratory: Positive for shortness of breath.   Cardiovascular: Negative.   Gastrointestinal: Positive for nausea, abdominal pain and diarrhea. Negative for vomiting, constipation and blood in stool.  Genitourinary: Positive for frequency. Negative for dysuria.  Musculoskeletal: Positive for extremity weakness.  Skin: Negative.   Neurological: Positive  for weakness and headaches. Negative for numbness.  Hematological: Negative.   Psychiatric/Behavioral: Negative.     Physical Exam  BP 115/74  Pulse 85  Temp(Src) 98.2 F (36.8 C) (Oral)  Resp 23  Ht 5\' 8"  (1.727 m)  Wt 190 lb (86.183 kg)  BMI 28.89 kg/m2  SpO2 97%  Physical Exam  ED Course  Procedures  OTHER DATA REVIEWED: Nursing notes, vital signs, and past medical  records reviewed.    DIAGNOSTIC STUDIES: Oxygen Saturation is 97% on 2 liters/min via Patient connected to nasal cannula oxygen, normal by my interpretation.     Date: 10/16/2010  Rate: 82  Rhythm: normal sinus rhythm  QRS Axis: normal  Intervals: normal  ST/T Wave abnormalities: normal  Conduction Disutrbances:right bundle branch block  Narrative Interpretation: first degree AV block, LAD  Old EKG Reviewed: unchanged from 12/20/2008   LABS / RADIOLOGY:   Results for orders placed during the hospital encounter of 10/16/10  CBC      Component Value Range   WBC 7.6  4.0 - 10.5 (K/uL)   RBC 3.63 (*) 4.22 - 5.81 (MIL/uL)   Hemoglobin 11.7 (*) 13.0 - 17.0 (g/dL)   HCT 98.1 (*) 19.1 - 52.0 (%)   MCV 97.5  78.0 - 100.0 (fL)   MCH 32.2  26.0 - 34.0 (pg)   MCHC 33.1  30.0 - 36.0 (g/dL)   RDW 47.8  29.5 - 62.1 (%)   Platelets 161  150 - 400 (K/uL)  COMPREHENSIVE METABOLIC PANEL      Component Value Range   Sodium 136  135 - 145 (mEq/L)   Potassium 4.2  3.5 - 5.1 (mEq/L)   Chloride 99  96 - 112 (mEq/L)   CO2 27  19 - 32 (mEq/L)   Glucose, Bld 117 (*) 70 - 99 (mg/dL)   BUN 13  6 - 23 (mg/dL)   Creatinine, Ser 3.08  0.50 - 1.35 (mg/dL)   Calcium 65.7  8.4 - 10.5 (mg/dL)   Total Protein 7.4  6.0 - 8.3 (g/dL)   Albumin 3.9  3.5 - 5.2 (g/dL)   AST 13  0 - 37 (U/L)   ALT 9  0 - 53 (U/L)   Alkaline Phosphatase 81  39 - 117 (U/L)   Total Bilirubin 0.6  0.3 - 1.2 (mg/dL)   GFR calc non Af Amer 57 (*) >60 (mL/min)   GFR calc Af Amer >60  >60 (mL/min)  URINALYSIS, ROUTINE W REFLEX MICROSCOPIC      Component Value Range   Color, Urine YELLOW  YELLOW    Appearance HAZY (*) CLEAR    Specific Gravity, Urine >1.030 (*) 1.005 - 1.030    pH 6.0  5.0 - 8.0    Glucose, UA NEGATIVE  NEGATIVE (mg/dL)   Hgb urine dipstick NEGATIVE  NEGATIVE    Bilirubin Urine MODERATE (*) NEGATIVE    Ketones, ur TRACE (*) NEGATIVE (mg/dL)   Protein, ur 30 (*) NEGATIVE (mg/dL)   Urobilinogen, UA 1.0  0.0 -  1.0 (mg/dL)   Nitrite NEGATIVE  NEGATIVE    Leukocytes, UA NEGATIVE  NEGATIVE   CULTURE, BLOOD (ROUTINE X 2)      Component Value Range   Specimen Description Blood RIGHT HAND     Special Requests       Value: BOTTLES DRAWN AEROBIC AND ANAEROBIC 4CC EACH BOTTLE   Culture PENDING     Report Status PENDING    CULTURE, BLOOD (ROUTINE X 2)  Component Value Range   Specimen Description Blood RIGHT ARM     Special Requests       Value: BOTTLES DRAWN AEROBIC AND ANAEROBIC 4CC EACH BOTTLE   Culture PENDING     Report Status PENDING    PRO B NATRIURETIC PEPTIDE      Component Value Range   BNP, POC 423.3  0 - 450 (pg/mL)  APTT      Component Value Range   aPTT 45 (*) 24 - 37 (seconds)  PROTIME-INR      Component Value Range   Prothrombin Time 14.7  11.6 - 15.2 (seconds)   INR 1.13  0.00 - 1.49   POCT I-STAT TROPONIN I      Component Value Range   Troponin i, poc 0.06  0.00 - 0.08 (ng/mL)   Comment 3           URINE MICROSCOPIC-ADD ON      Component Value Range   Squamous Epithelial / LPF FEW (*) RARE    WBC, UA 7-10  <3 (WBC/hpf)   Bacteria, UA MANY (*) RARE    Casts HYALINE CASTS (*) NEGATIVE     Ct Head Wo Contrast  10/16/2010  *RADIOLOGY REPORT*  Clinical Data: Headache and weakness.  CT HEAD WITHOUT CONTRAST  Technique:  Contiguous axial images were obtained from the base of the skull through the vertex without contrast.  Comparison: None.  Findings: There is no evidence for acute hemorrhage, hydrocephalus, mass lesion, or abnormal extra-axial fluid collection.  No definite CT evidence for acute infarction.  The visualized paranasal sinuses and mastoid air cells are clear.  IMPRESSION: No acute intracranial abnormality.  Original Report Authenticated By: ERIC A. MANSELL, M.D.    Dg Chest 2 View  10/16/2010  *RADIOLOGY REPORT*  Clinical Data: Shortness of breath.  Weakness.  Abdominal pain.  CHEST - 2 VIEW  Comparison: 02/25/2006.  Findings: Normal sized heart.  Stable pleural  and parenchymal scarring at both lung bases.  The lungs remain hyperexpanded.  Skin fold overlying the anterior clear space on the lateral view. Diffuse osteopenia.  IMPRESSION: Stable mild changes of COPD and bibasilar scarring.  No acute abnormality.  Original Report Authenticated By: Darrol Angel, M.D.    Dg Abd 2 Views  10/16/2010  *RADIOLOGY REPORT*  Clinical Data: Abdominal pain.  Weakness.  ABDOMEN - 2 VIEW  Comparison: Chest radiographs obtained at the same time.  Findings: Normal bowel gas pattern without free peritoneal air. Surgical clips in the left upper and right lower abdomen.  Lumbar spine degenerative changes.  IMPRESSION: No acute abnormality.  Original Report Authenticated By: Darrol Angel, M.D.    ED COURSE / COORDINATION OF CARE: 11:20 - EDMD examined patient and ordered the following Orders Placed This Encounter  Procedures  . Culture, blood (routine x 2)  . DG Abd Acute W/Chest  . DG Chest 2 View  . DG Abd Acute W/Chest  . CT Head Wo Contrast  . CBC  . Comprehensive metabolic panel  . Urinalysis with microscopic  . Pro b natriuretic peptide  . APTT  . Protime-INR  . Saline lock IV    PT has dehydration and UTI, will given IV fluids, IV antibiotics and admit until able to ambulate without risk of falling. Pt is agreeable, family in room.   MDM:   Production assistant, radio I personally performed the services described in this documentation, which was scribed in my presence. The recorded information has been reviewed and considered.  Ward Givens, MD           Ward Givens, MD 10/16/10 1440  3:09 PM Dr Karilyn Cota is going to see for admission.  Devoria Albe, MD, Armando Gang  Ward Givens, MD 10/16/10 [phone]

## 2010-10-17 ENCOUNTER — Encounter (HOSPITAL_COMMUNITY): Payer: Self-pay | Admitting: Internal Medicine

## 2010-10-17 LAB — COMPREHENSIVE METABOLIC PANEL
ALT: 8 U/L (ref 0–53)
Alkaline Phosphatase: 69 U/L (ref 39–117)
BUN: 14 mg/dL (ref 6–23)
CO2: 29 mEq/L (ref 19–32)
Chloride: 101 mEq/L (ref 96–112)
GFR calc Af Amer: >60 mL/min (ref >60)
Glucose, Bld: 108 mg/dL — ABNORMAL HIGH (ref 70–99)
Potassium: 4.1 mEq/L (ref 3.5–5.1)
Sodium: 136 mEq/L (ref 135–145)
Total Bilirubin: 0.4 mg/dL (ref 0.3–1.2)

## 2010-10-17 LAB — PROTIME-INR
INR: 1.11 (ref 0.00–1.49)
Prothrombin Time: 14.5 seconds (ref 11.6–15.2)

## 2010-10-17 LAB — CBC
MCHC: 32.9 g/dL (ref 30.0–36.0)
Platelets: 137 10*3/uL — ABNORMAL LOW (ref 150–400)
RDW: 13.8 % (ref 11.5–15.5)
WBC: 5.4 10*3/uL (ref 4.0–10.5)

## 2010-10-17 MED ORDER — SENNOSIDES-DOCUSATE SODIUM 8.6-50 MG PO TABS: 1.0000 | ORAL_TABLET | Freq: Every day | ORAL | Status: DC | PRN

## 2010-10-17 MED ORDER — CIPROFLOXACIN HCL 500 MG PO TABS: 500.0000 mg | ORAL_TABLET | Freq: Two times a day (BID) | ORAL | Status: AC

## 2010-10-17 NOTE — Discharge Summary (Signed)
Physician Discharge Summary  Patient ID: Bryce Mcdonald MRN: [id number] DOB/AGE: 1921/11/02 77 y.o.  Admit date: 10/16/2010 Discharge date: 10/17/2010    Discharge Diagnoses:  1. Presumed UTI, culture pending. 2. Lower abdominal pain. 3. Constipation. 4. Hypertension. 5. Prostate cancer. 6. History of ventral hernia. 7. History of colon cancer status post surgery.   Current Discharge Medication List    START taking these medications   Details  ciprofloxacin (CIPRO) 500 MG tablet Take 1 tablet (500 mg total) by mouth 2 (two) times daily. Qty: 10 tablet, Refills: 0    senna-docusate (SENOKOT-S) 8.6-50 MG per tablet Take 1 tablet by mouth daily as needed for constipation. Qty: 30 tablet, Refills: 0      CONTINUE these medications which have NOT CHANGED   Details  enoxaparin (LOVENOX) 80 MG/0.8ML SOLN Inject 80 mg into the skin daily.      metoprolol (LOPRESSOR) 50 MG tablet Take 50 mg by mouth 2 (two) times daily.      omeprazole (PRILOSEC) 20 MG capsule Take 20 mg by mouth daily.      simvastatin (ZOCOR) 40 MG tablet Take 20 mg by mouth at bedtime.        STOP taking these medications     lisinopril-hydrochlorothiazide (PRINZIDE,ZESTORETIC) 10-12.5 MG per tablet      PRESCRIPTION MEDICATION         Discharged Condition: Stable and improved.    Consults: None.  Significant Diagnostic Studies: Dg Chest 2 View  10/16/2010  *RADIOLOGY REPORT*  Clinical Data: Shortness of breath.  Weakness.  Abdominal pain.  CHEST - 2 VIEW  Comparison: 02/25/2006.  Findings: Normal sized heart.  Stable pleural and parenchymal scarring at both lung bases.  The lungs remain hyperexpanded.  Skin fold overlying the anterior clear space on the lateral view. Diffuse osteopenia.  IMPRESSION: Stable mild changes of COPD and bibasilar scarring.  No acute abnormality.  Original Report Authenticated By: Darrol Angel, M.D.   Ct Head Wo Contrast  10/16/2010  *RADIOLOGY REPORT*  Clinical  Data: Headache and weakness.  CT HEAD WITHOUT CONTRAST  Technique:  Contiguous axial images were obtained from the base of the skull through the vertex without contrast.  Comparison: None.  Findings: There is no evidence for acute hemorrhage, hydrocephalus, mass lesion, or abnormal extra-axial fluid collection.  No definite CT evidence for acute infarction.  The visualized paranasal sinuses and mastoid air cells are clear.  IMPRESSION: No acute intracranial abnormality.  Original Report Authenticated By: ERIC A. MANSELL, M.D.   Dg Abd 2 Views  10/16/2010  *RADIOLOGY REPORT*  Clinical Data: Abdominal pain.  Weakness.  ABDOMEN - 2 VIEW  Comparison: Chest radiographs obtained at the same time.  Findings: Normal bowel gas pattern without free peritoneal air. Surgical clips in the left upper and right lower abdomen.  Lumbar spine degenerative changes.  IMPRESSION: No acute abnormality.  Original Report Authenticated By: Darrol Angel, M.D.    Lab Results: Results for orders placed during the hospital encounter of 10/16/10 (from the past 48 hour(s))  CBC     Status: Abnormal   Collection Time   10/16/10 11:43 AM      Component Value Range Comment   WBC 7.6  4.0 - 10.5 (K/uL)    RBC 3.63 (*) 4.22 - 5.81 (MIL/uL)    Hemoglobin 11.7 (*) 13.0 - 17.0 (g/dL)    HCT 91.4 (*) 78.2 - 52.0 (%)    MCV 97.5  78.0 - 100.0 (fL)    MCH  32.2  26.0 - 34.0 (pg)    MCHC 33.1  30.0 - 36.0 (g/dL)    RDW 96.0  45.4 - 09.8 (%)    Platelets 161  150 - 400 (K/uL)   COMPREHENSIVE METABOLIC PANEL     Status: Abnormal   Collection Time   10/16/10 11:43 AM      Component Value Range Comment   Sodium 136  135 - 145 (mEq/L)    Potassium 4.2  3.5 - 5.1 (mEq/L)    Chloride 99  96 - 112 (mEq/L)    CO2 27  19 - 32 (mEq/L)    Glucose, Bld 117 (*) 70 - 99 (mg/dL)    BUN 13  6 - 23 (mg/dL)    Creatinine, Ser 1.19  0.50 - 1.35 (mg/dL)    Calcium 14.7  8.4 - 10.5 (mg/dL)    Total Protein 7.4  6.0 - 8.3 (g/dL)    Albumin 3.9  3.5  - 5.2 (g/dL)    AST 13  0 - 37 (U/L)    ALT 9  0 - 53 (U/L)    Alkaline Phosphatase 81  39 - 117 (U/L)    Total Bilirubin 0.6  0.3 - 1.2 (mg/dL)    GFR calc non Af Amer 57 (*) >60 (mL/min)    GFR calc Af Amer >60  >60 (mL/min)   PRO B NATRIURETIC PEPTIDE     Status: Normal   Collection Time   10/16/10 11:43 AM      Component Value Range Comment   BNP, POC 423.3  0 - 450 (pg/mL)   APTT     Status: Abnormal   Collection Time   10/16/10 11:43 AM      Component Value Range Comment   aPTT 45 (*) 24 - 37 (seconds)   PROTIME-INR     Status: Normal   Collection Time   10/16/10 11:43 AM      Component Value Range Comment   Prothrombin Time 14.7  11.6 - 15.2 (seconds)    INR 1.13  0.00 - 1.49    CULTURE, BLOOD (ROUTINE X 2)     Status: Normal (Preliminary result)   Collection Time   10/16/10 11:45 AM      Component Value Range Comment   Specimen Description Blood RIGHT HAND      Special Requests        Value: BOTTLES DRAWN AEROBIC AND ANAEROBIC 4CC EACH BOTTLE   Culture PENDING      Report Status PENDING     CULTURE, BLOOD (ROUTINE X 2)     Status: Normal (Preliminary result)   Collection Time   10/16/10 12:00 PM      Component Value Range Comment   Specimen Description Blood RIGHT ARM      Special Requests        Value: BOTTLES DRAWN AEROBIC AND ANAEROBIC 4CC EACH BOTTLE   Culture PENDING      Report Status PENDING     POCT I-STAT TROPONIN I     Status: Normal   Collection Time   10/16/10 12:07 PM      Component Value Range Comment   Troponin i, poc 0.06  0.00 - 0.08 (ng/mL)    Comment 3            URINALYSIS, ROUTINE W REFLEX MICROSCOPIC     Status: Abnormal   Collection Time   10/16/10  1:09 PM      Component Value Range Comment   Color,  Urine YELLOW  YELLOW     Appearance HAZY (*) CLEAR     Specific Gravity, Urine >1.030 (*) 1.005 - 1.030     pH 6.0  5.0 - 8.0     Glucose, UA NEGATIVE  NEGATIVE (mg/dL)    Hgb urine dipstick NEGATIVE  NEGATIVE     Bilirubin Urine MODERATE  (*) NEGATIVE     Ketones, ur TRACE (*) NEGATIVE (mg/dL)    Protein, ur 30 (*) NEGATIVE (mg/dL)    Urobilinogen, UA 1.0  0.0 - 1.0 (mg/dL)    Nitrite NEGATIVE  NEGATIVE     Leukocytes, UA NEGATIVE  NEGATIVE    URINE MICROSCOPIC-ADD ON     Status: Abnormal   Collection Time   10/16/10  1:09 PM      Component Value Range Comment   Squamous Epithelial / LPF FEW (*) RARE     WBC, UA 7-10  <3 (WBC/hpf)    Bacteria, UA MANY (*) RARE     Casts HYALINE CASTS (*) NEGATIVE    COMPREHENSIVE METABOLIC PANEL     Status: Abnormal   Collection Time   10/17/10  5:04 AM      Component Value Range Comment   Sodium 136  135 - 145 (mEq/L)    Potassium 4.1  3.5 - 5.1 (mEq/L)    Chloride 101  96 - 112 (mEq/L)    CO2 29  19 - 32 (mEq/L)    Glucose, Bld 108 (*) 70 - 99 (mg/dL)    BUN 14  6 - 23 (mg/dL)    Creatinine, Ser 1.61  0.50 - 1.35 (mg/dL)    Calcium 9.2  8.4 - 10.5 (mg/dL)    Total Protein 6.3  6.0 - 8.3 (g/dL)    Albumin 3.3 (*) 3.5 - 5.2 (g/dL)    AST 11  0 - 37 (U/L)    ALT 8  0 - 53 (U/L)    Alkaline Phosphatase 69  39 - 117 (U/L)    Total Bilirubin 0.4  0.3 - 1.2 (mg/dL)    GFR calc non Af Amer >60  >60 (mL/min)    GFR calc Af Amer >60  >60 (mL/min)   CBC     Status: Abnormal   Collection Time   10/17/10  5:04 AM      Component Value Range Comment   WBC 5.4  4.0 - 10.5 (K/uL)    RBC 3.15 (*) 4.22 - 5.81 (MIL/uL)    Hemoglobin 10.2 (*) 13.0 - 17.0 (g/dL)    HCT 09.6 (*) 04.5 - 52.0 (%)    MCV 98.4  78.0 - 100.0 (fL)    MCH 32.4  26.0 - 34.0 (pg)    MCHC 32.9  30.0 - 36.0 (g/dL)    RDW 40.9  81.1 - 91.4 (%)    Platelets 137 (*) 150 - 400 (K/uL)   APTT     Status: Abnormal   Collection Time   10/17/10  5:04 AM      Component Value Range Comment   aPTT 38 (*) 24 - 37 (seconds)   PROTIME-INR     Status: Normal   Collection Time   10/17/10  5:04 AM      Component Value Range Comment   Prothrombin Time 14.5  11.6 - 15.2 (seconds)    INR 1.11  0.00 - 1.49     Recent Results (from  the past 240 hour(s))  CULTURE, BLOOD (ROUTINE X 2)     Status: Normal (Preliminary  result)   Collection Time   10/16/10 11:45 AM      Component Value Range Status Comment   Specimen Description Blood RIGHT HAND   Final    Special Requests     Final    Value: BOTTLES DRAWN AEROBIC AND ANAEROBIC 4CC EACH BOTTLE   Culture PENDING   Incomplete    Report Status PENDING   Incomplete   CULTURE, BLOOD (ROUTINE X 2)     Status: Normal (Preliminary result)   Collection Time   10/16/10 12:00 PM      Component Value Range Status Comment   Specimen Description Blood RIGHT ARM   Final    Special Requests     Final    Value: BOTTLES DRAWN AEROBIC AND ANAEROBIC 4CC EACH BOTTLE   Culture PENDING   Incomplete    Report Status PENDING   Incomplete      Hospital Course: This 77 year old veteran was admitted yesterday with lower abdominal pain. The pain was nonspecific and it is improved today somewhat. He had an abnormal urine and was mildly dehydrated. He has been treated with intravenous fluids and intravenous ciprofloxacin. He does feel somewhat better today but on closer questioning this morning he tells me that his bowels have not been regular in the last week or so. I think significant part of his problem may be constipation. There is no nausea, vomiting, fever.  Discharge Exam: Blood pressure 104/49, pulse 63, temperature 98.4 F (36.9 C), temperature source Oral, resp. rate 24, height 5\' 8"  (1.727 m), weight 83.8 kg (184 lb 11.9 oz), SpO2 98.00%. It is systemically well. He certainly not toxic or septic. Abdomen is soft and really nontender today. Bowel sounds are heard and are normal. There are no masses felt. There is no hepatosplenomegaly. Lung fields are clear. Heart sounds are present and normal without murmurs. Jugular venous pressure not raised. Neurologically he is intact.  Disposition: Home. I've told the patient to monitor his blood pressure and restart his lisinopril/HCTZ if necessary.  Certainly his blood pressure is quite adequate without this medication in the hospital.  Discharge Orders    Future Orders Please Complete By Expires   Diet - low sodium heart healthy      Increase activity slowly      Discharge instructions      Comments:   Please check your blood pressure every day. If it is becoming elevated again, restart lisinopril/HCTZ.        SignedWilson Singer 10/17/2010, 7:58 AM

## 2010-10-17 NOTE — Progress Notes (Signed)
UR Chart Review Completed  

## 2010-10-17 NOTE — Progress Notes (Signed)
Pad d/ced site wnl prescription given states understanding of discharge.

## 2010-10-18 LAB — URINE CULTURE

## 2010-10-22 LAB — CULTURE, BLOOD (ROUTINE X 2)
Culture: NO GROWTH
Culture: NO GROWTH

## 2010-11-01 NOTE — Progress Notes (Signed)
Encounter addended by: Angela Lilly on: 11/01/2010 11:39 AM<BR>     Documentation filed: Flowsheet VN

## 2010-11-15 LAB — CBC
HCT: 34.2 — ABNORMAL LOW
HCT: 34.8 — ABNORMAL LOW
Hemoglobin: 12.1 — ABNORMAL LOW
Hemoglobin: 12.9 — ABNORMAL LOW
MCHC: 34.8
MCV: 97
Platelets: 162
Platelets: 167
RBC: 3.52 — ABNORMAL LOW
RBC: 3.54 — ABNORMAL LOW
RBC: 3.59 — ABNORMAL LOW
RDW: 14.3
RDW: 14.7
WBC: 3.5 — ABNORMAL LOW
WBC: 4.4
WBC: 5.3

## 2010-11-15 LAB — BASIC METABOLIC PANEL
BUN: 7
CO2: 27
Calcium: 8.8
Chloride: 105
Creatinine, Ser: 1.03
GFR calc Af Amer: >60
GFR calc Af Amer: >60
GFR calc Af Amer: >60
GFR calc non Af Amer: >60
GFR calc non Af Amer: >60
Potassium: 4.2
Sodium: 138
Sodium: 139
Sodium: 138

## 2010-11-15 LAB — COMPREHENSIVE METABOLIC PANEL
ALT: 11
ALT: 13
Alkaline Phosphatase: 55
BUN: 15
CO2: 26
Calcium: 9.8
Chloride: 106
Creatinine, Ser: 1.06
GFR calc Af Amer: >60
GFR calc non Af Amer: >60
Glucose, Bld: 123 — ABNORMAL HIGH
Glucose, Bld: 125 — ABNORMAL HIGH
Potassium: 3.5
Sodium: 140
Sodium: 144
Total Bilirubin: 0.6
Total Protein: 6.6
Total Protein: 7.4

## 2010-11-15 LAB — DIFFERENTIAL
Eosinophils Absolute: 0
Eosinophils Absolute: 0
Eosinophils Relative: 1
Lymphocytes Relative: 35
Lymphocytes Relative: 37
Lymphocytes Relative: 46
Lymphs Abs: 1.5
Lymphs Abs: 1.6
Lymphs Abs: 1.9
Monocytes Relative: 12
Monocytes Relative: 6
Neutro Abs: 2.4
Neutro Abs: 2.8
Neutrophils Relative %: 40 — ABNORMAL LOW
Neutrophils Relative %: 55
Neutrophils Relative %: 57

## 2010-11-15 LAB — APTT: aPTT: 31

## 2010-11-15 LAB — URINALYSIS, ROUTINE W REFLEX MICROSCOPIC
Glucose, UA: NEGATIVE
Ketones, ur: NEGATIVE
Nitrite: NEGATIVE
Specific Gravity, Urine: 1.025
pH: 6

## 2010-11-15 LAB — PHOSPHORUS
Phosphorus: 1.9 — ABNORMAL LOW
Phosphorus: 2.5

## 2010-11-15 LAB — MAGNESIUM: Magnesium: 2

## 2010-11-15 LAB — LIPASE, BLOOD: Lipase: 30

## 2010-11-15 LAB — PROTIME-INR: Prothrombin Time: 13.4

## 2010-11-15 LAB — CALCIUM: Calcium: 8.8

## 2010-11-29 LAB — BASIC METABOLIC PANEL
CO2: 29
Calcium: 9.6
Chloride: 106
GFR calc Af Amer: >60
Sodium: 140

## 2010-11-29 LAB — URINALYSIS, ROUTINE W REFLEX MICROSCOPIC
Bilirubin Urine: NEGATIVE
Glucose, UA: NEGATIVE
Hgb urine dipstick: NEGATIVE
Specific Gravity, Urine: 1.025
pH: 6.5

## 2010-11-29 LAB — DIFFERENTIAL
Basophils Relative: 1
Eosinophils Absolute: 0 — ABNORMAL LOW
Lymphocytes Relative: 22
Lymphocytes Relative: 45
Lymphs Abs: 1.2
Lymphs Abs: 1.9
Monocytes Absolute: 0.4
Monocytes Relative: 8
Neutro Abs: 1.9
Neutrophils Relative %: 67

## 2010-11-29 LAB — HEPATIC FUNCTION PANEL
ALT: 14
AST: 18
Alkaline Phosphatase: 62
Bilirubin, Direct: 0.1
Total Bilirubin: 0.5

## 2010-11-29 LAB — COMPREHENSIVE METABOLIC PANEL
BUN: 9
CO2: 32
Calcium: 8.9
Creatinine, Ser: 1.12
GFR calc non Af Amer: >60
Glucose, Bld: 111 — ABNORMAL HIGH

## 2010-11-29 LAB — CBC
Hemoglobin: 12.3 — ABNORMAL LOW
Hemoglobin: 12.4 — ABNORMAL LOW
MCHC: 34.2
MCHC: 34
MCV: 97.6
RBC: 3.67 — ABNORMAL LOW
RBC: 3.78 — ABNORMAL LOW

## 2010-12-07 LAB — DIFFERENTIAL
Basophils Absolute: 0
Eosinophils Absolute: 0
Eosinophils Absolute: 0
Eosinophils Relative: 0
Lymphs Abs: 0.8
Neutro Abs: 1.4 — ABNORMAL LOW
Neutrophils Relative %: 54

## 2010-12-07 LAB — URINE CULTURE: Special Requests: NEGATIVE

## 2010-12-07 LAB — COMPREHENSIVE METABOLIC PANEL
ALT: 48
AST: 65 — ABNORMAL HIGH
AST: 98 — ABNORMAL HIGH
Albumin: 3.5
Alkaline Phosphatase: 100
Alkaline Phosphatase: 86
BUN: 13
CO2: 27
Chloride: 105
GFR calc Af Amer: >60
GFR calc non Af Amer: 58 — ABNORMAL LOW
Potassium: 3.8
Sodium: 136
Sodium: 138
Total Bilirubin: 0.9
Total Protein: 6.9

## 2010-12-07 LAB — B. BURGDORFI ANTIBODIES: B burgdorferi Ab IgG+IgM: 0.07

## 2010-12-07 LAB — ROCKY MTN SPOTTED FVR AB, IGG-BLOOD: RMSF IgG: 1.01 {ISR} — ABNORMAL HIGH

## 2010-12-07 LAB — CBC
Hemoglobin: 12.6 — ABNORMAL LOW
MCHC: 35.3
MCV: 94.8
RBC: 3.52 — ABNORMAL LOW
RBC: 3.76 — ABNORMAL LOW
WBC: 1.9 — ABNORMAL LOW
WBC: 2.5 — ABNORMAL LOW

## 2010-12-07 LAB — CULTURE, BLOOD (ROUTINE X 2): Culture: NO GROWTH

## 2010-12-07 LAB — ROCKY MTN SPOTTED FVR AB, IGM-BLOOD: RMSF IgM: 0.04

## 2010-12-07 LAB — PROTIME-INR: Prothrombin Time: 15.2

## 2011-06-14 ENCOUNTER — Emergency Department (HOSPITAL_COMMUNITY): Payer: Non-veteran care

## 2011-06-14 ENCOUNTER — Emergency Department (HOSPITAL_COMMUNITY)
Admission: EM | Admit: 2011-06-14 | Discharge: 2011-06-14 | Disposition: A | Discharge status: Home / Self Care | Payer: Non-veteran care | Attending: Emergency Medicine | Admitting: Emergency Medicine

## 2011-06-14 ENCOUNTER — Encounter (HOSPITAL_COMMUNITY): Payer: Self-pay | Admitting: Emergency Medicine

## 2011-06-14 DIAGNOSIS — M545 Low back pain, unspecified: Secondary | ICD-10-CM | POA: Insufficient documentation

## 2011-06-14 DIAGNOSIS — R000 Tachycardia, unspecified: Secondary | ICD-10-CM | POA: Insufficient documentation

## 2011-06-14 DIAGNOSIS — Z86711 Personal history of pulmonary embolism: Secondary | ICD-10-CM | POA: Insufficient documentation

## 2011-06-14 DIAGNOSIS — I2510 Atherosclerotic heart disease of native coronary artery without angina pectoris: Secondary | ICD-10-CM | POA: Insufficient documentation

## 2011-06-14 DIAGNOSIS — Z86718 Personal history of other venous thrombosis and embolism: Secondary | ICD-10-CM | POA: Insufficient documentation

## 2011-06-14 DIAGNOSIS — E7800 Pure hypercholesterolemia, unspecified: Secondary | ICD-10-CM | POA: Insufficient documentation

## 2011-06-14 DIAGNOSIS — I10 Essential (primary) hypertension: Secondary | ICD-10-CM | POA: Insufficient documentation

## 2011-06-14 LAB — URINALYSIS, ROUTINE W REFLEX MICROSCOPIC
Ketones, ur: NEGATIVE mg/dL
Leukocytes, UA: NEGATIVE
Nitrite: NEGATIVE
Specific Gravity, Urine: 1.02 (ref 1.005–1.030)
pH: 6 (ref 5.0–8.0)

## 2011-06-14 MED ORDER — OXYCODONE-ACETAMINOPHEN 5-325 MG PO TABS: 1.0000 | ORAL_TABLET | ORAL | Status: AC | PRN

## 2011-06-14 NOTE — ED Provider Notes (Signed)
History     CSN: [id number]  Arrival date & time 06/14/11  1045   First MD Initiated Contact with Patient 06/14/11 1116      Chief Complaint  Patient presents with  . Back Pain    HPI Bryce Mcdonald is a 78 y.o. male who presents to the ED for low back pain. The pain started 3 months ago. He describes the pain as sharp and starting in the right lower back and radiating to the right leg. He has had surgery in the past for disc problems in the lower back. His PCP is at the [facility] hospital. His regular doctor there has gone and he has not yet seen a new doctor. Patient states he needs something for pain.  The history was provided by the patient.  Past Medical History  Diagnosis Date  . Cancer   . Hypertension   . Hypercholesterolemia   . Blood transfusion   . Coronary artery disease   . DVT (deep venous thrombosis)   . PE (pulmonary embolism)     Past Surgical History  Procedure Date  . Abdominal surgery     x 3  . Cardiac catheterization 2008    History reviewed. No pertinent family history.  History  Substance Use Topics  . Smoking status: Never Smoker   . Smokeless tobacco: Not on file  . Alcohol Use: No      Review of Systems  Constitutional: Negative for fever and chills.  HENT: Negative for neck pain.   Respiratory: Negative.   Cardiovascular: Negative for chest pain.  Gastrointestinal: Negative for nausea, vomiting and abdominal pain.  Musculoskeletal: Positive for back pain.  Skin: Negative.   Neurological: Negative for dizziness and headaches.  Psychiatric/Behavioral: The patient is not nervous/anxious.     Allergies  Review of patient's allergies indicates no known allergies.  Home Medications   Current Outpatient Rx  Name Route Sig Dispense Refill  . ENOXAPARIN SODIUM 80 MG/0.8ML [place] SOLN Subcutaneous Inject 80 mg into the skin daily.      Marland Kitchen METOPROLOL TARTRATE 50 MG PO TABS Oral Take 50 mg by mouth 2 (two) times daily.      Marland Kitchen OMEPRAZOLE 20 MG PO  CPDR Oral Take 20 mg by mouth daily.      Bernadette Hoit SODIUM 8.6-50 MG PO TABS Oral Take 1 tablet by mouth daily as needed for constipation. 30 tablet 0  . SIMVASTATIN 40 MG PO TABS Oral Take 20 mg by mouth at bedtime.        BP 142/67  Pulse 109  Temp(Src) 97.5 F (36.4 C) (Oral)  Resp 20  Ht 5\' 8"  (1.727 m)  Wt 189 lb (85.73 kg)  BMI 28.74 kg/m2  SpO2 100%  Physical Exam  Constitutional: He is oriented to person, place, and time. He appears well-developed and well-nourished. No distress.  HENT:  Head: Normocephalic.  Eyes: EOM are normal.  Neck: Neck supple.  Cardiovascular:       tachycardia  Pulmonary/Chest: Effort normal and breath sounds normal.  Musculoskeletal:       Pain with range of motion of lower back.  Neurological: He is alert and oriented to person, place, and time. He has normal reflexes. No cranial nerve deficit.       Pulses present in lower extremities and equal. Adequate circulation.  Skin: Skin is warm and dry.  Psychiatric: He has a normal mood and affect. His behavior is normal. Judgment and thought content normal.   Assessment: Chronic  back pain  Plan:  Rx Percocet   Follow up with the [facility] hospital   I have reviewed this patient's vital signs, nurses notes, appropriate labs and imaging.   ED Course  Procedures  MDM          Janne Napoleon, NP 06/14/11 1334

## 2011-06-14 NOTE — ED Notes (Signed)
Pt c/o back pain x 3 months. Pt denies any injury to the area.

## 2011-06-14 NOTE — Discharge Instructions (Signed)
Follow up with your doctor at the [facility] hospital. Return here as needed.  Back Pain, Adult Low back pain is very common. About 1 in 5 people have back pain.The cause of low back pain is rarely dangerous. The pain often gets better over time.About half of people with a sudden onset of back pain feel better in just 2 weeks. About 8 in 10 people feel better by 6 weeks.  CAUSES Some common causes of back pain include:  Strain of the muscles or ligaments supporting the spine.   Wear and tear (degeneration) of the spinal discs.   Arthritis.   Direct injury to the back.  DIAGNOSIS Most of the time, the direct cause of low back pain is not known.However, back pain can be treated effectively even when the exact cause of the pain is unknown.Answering your caregiver's questions about your overall health and symptoms is one of the most accurate ways to make sure the cause of your pain is not dangerous. If your caregiver needs more information, he or she may order lab work or imaging tests (X-rays or MRIs).However, even if imaging tests show changes in your back, this usually does not require surgery. HOME CARE INSTRUCTIONS For many people, back pain returns.Since low back pain is rarely dangerous, it is often a condition that people can learn to [facility] their own.   Remain active. It is stressful on the back to sit or stand in one place. Do not sit, drive, or stand in one place for more than 30 minutes at a time. Take short walks on level surfaces as soon as pain allows.Try to increase the length of time you walk each day.   Do not stay in bed.Resting more than 1 or 2 days can delay your recovery.   Do not avoid exercise or work.Your body is made to move.It is not dangerous to be active, even though your back may hurt.Your back will likely heal faster if you return to being active before your pain is gone.   Pay attention to your body when you bend and lift. Many people have less  discomfortwhen lifting if they bend their knees, keep the load close to their bodies,and avoid twisting. Often, the most comfortable positions are those that put less stress on your recovering back.   Find a comfortable position to sleep. Use a firm mattress and lie on your side with your knees slightly bent. If you lie on your back, put a pillow under your knees.   Only take over-the-counter or prescription medicines as directed by your caregiver. Over-the-counter medicines to reduce pain and inflammation are often the most helpful.Your caregiver may prescribe muscle relaxant drugs.These medicines help dull your pain so you can more quickly return to your normal activities and healthy exercise.   Put ice on the injured area.   Put ice in a plastic bag.   Place a towel between your skin and the bag.   Leave the ice on for 15 to 20 minutes, 3 to 4 times a day for the first 2 to 3 days. After that, ice and heat may be alternated to reduce pain and spasms.   Ask your caregiver about trying back exercises and gentle massage. This may be of some benefit.   Avoid feeling anxious or stressed.Stress increases muscle tension and can worsen back pain.It is important to recognize when you are anxious or stressed and learn ways to manage it.Exercise is a great option.  SEEK MEDICAL CARE IF:  You  have pain that is not relieved with rest or medicine.   You have pain that does not improve in 1 week.   You have new symptoms.   You are generally not feeling well.  SEEK IMMEDIATE MEDICAL CARE IF:   You have pain that radiates from your back into your legs.   You develop new bowel or bladder control problems.   You have unusual weakness or numbness in your arms or legs.   You develop nausea or vomiting.   You develop abdominal pain.   You feel faint.  Document Released: 02/06/2005 Document Revised: 01/26/2011 Document Reviewed: 06/27/2010 [facility] Patient Information 2012 [place],  [place].

## 2011-06-14 NOTE — ED Provider Notes (Signed)
Medical screening examination/treatment/procedure(s) were performed by non-physician practitioner and as supervising physician I was immediately available for consultation/collaboration.   Laray Anger, DO 06/14/11 2059

## 2011-06-18 ENCOUNTER — Encounter (HOSPITAL_COMMUNITY): Payer: Self-pay | Admitting: *Deleted

## 2011-06-18 ENCOUNTER — Emergency Department (HOSPITAL_COMMUNITY)
Admission: EM | Admit: 2011-06-18 | Discharge: 2011-06-18 | Disposition: A | Discharge status: Home / Self Care | Payer: Non-veteran care | Attending: Emergency Medicine | Admitting: Emergency Medicine

## 2011-06-18 DIAGNOSIS — M545 Low back pain, unspecified: Secondary | ICD-10-CM | POA: Insufficient documentation

## 2011-06-18 DIAGNOSIS — E7800 Pure hypercholesterolemia, unspecified: Secondary | ICD-10-CM | POA: Insufficient documentation

## 2011-06-18 DIAGNOSIS — Z86718 Personal history of other venous thrombosis and embolism: Secondary | ICD-10-CM | POA: Insufficient documentation

## 2011-06-18 DIAGNOSIS — Z86711 Personal history of pulmonary embolism: Secondary | ICD-10-CM | POA: Insufficient documentation

## 2011-06-18 DIAGNOSIS — I2510 Atherosclerotic heart disease of native coronary artery without angina pectoris: Secondary | ICD-10-CM | POA: Insufficient documentation

## 2011-06-18 DIAGNOSIS — Z79899 Other long term (current) drug therapy: Secondary | ICD-10-CM | POA: Insufficient documentation

## 2011-06-18 DIAGNOSIS — I10 Essential (primary) hypertension: Secondary | ICD-10-CM | POA: Insufficient documentation

## 2011-06-18 MED ORDER — OXYCODONE-ACETAMINOPHEN 5-325 MG PO TABS
1.0000 | ORAL_TABLET | Freq: Once | ORAL | Status: AC
  Administered 2011-06-18: 1 via ORAL
  Filled 2011-06-18: qty 1

## 2011-06-18 NOTE — ED Notes (Signed)
Back pain. Here a few days ago for same. Pain is no better.

## 2011-06-18 NOTE — ED Provider Notes (Cosign Needed)
History   This chart was scribed for Bryce Human, MD by Toya Smothers. The patient was seen in room APA07/APA07. Patient's care was started at 1306.    CSN: [id number]  Arrival date & time 06/18/11  1306   First MD Initiated Contact with Patient 06/18/11 1317      Chief Complaint  Patient presents with  . Back Pain    (Consider location/radiation/quality/duration/timing/severity/associated sxs/prior treatment) HPI [name] is a 78 y.o. male who presents to the Emergency Department complaining of constant moderate lower back pain on the right side radiating down the right leg onset 3 months ago. The patient denies SOB, numbness, tingling . Patient was previously evaluated at [facility] for back pain and prescribed percocet for DDD but did not fill the prescription. Pt. Is currently taking blood thinning medication for blood clots and has had an IVC filter placement at [facility] hospital in [place], further listing [facility] as PCP where he was last seen 06/07/11.   Past Medical History  Diagnosis Date  . Cancer   . Hypertension   . Hypercholesterolemia   . Blood transfusion   . Coronary artery disease   . DVT (deep venous thrombosis)   . PE (pulmonary embolism)     Past Surgical History  Procedure Date  . Abdominal surgery     x 3  . Cardiac catheterization 2008    No family history on file.  History  Substance Use Topics  . Smoking status: Never Smoker   . Smokeless tobacco: Not on file  . Alcohol Use: No      Review of Systems  Constitutional: Negative for fever and chills.  HENT: Negative for rhinorrhea and neck pain.   Eyes: Negative for pain.  Respiratory: Negative for cough and shortness of breath.   Cardiovascular: Negative for chest pain.  Gastrointestinal: Negative for nausea, vomiting, abdominal pain and diarrhea.  Genitourinary: Negative for dysuria.  Musculoskeletal: Positive for back pain.  Skin: Negative for rash.  Neurological: Negative  for dizziness and weakness.    Allergies  Review of patient's allergies indicates no known allergies.  Home Medications   Current Outpatient Rx  Name Route Sig Dispense Refill  . ENOXAPARIN SODIUM 80 MG/0.8ML [place] SOLN Subcutaneous Inject 80 mg into the skin daily.      Marland Kitchen METOPROLOL TARTRATE 50 MG PO TABS Oral Take 50 mg by mouth 2 (two) times daily.      Marland Kitchen OMEPRAZOLE 20 MG PO CPDR Oral Take 20 mg by mouth daily.      . OXYCODONE-ACETAMINOPHEN 5-325 MG PO TABS Oral Take 1 tablet by mouth every 4 (four) hours as needed for pain. 15 tablet 0  . SENNOSIDES-DOCUSATE SODIUM 8.6-50 MG PO TABS Oral Take 1 tablet by mouth daily as needed for constipation. 30 tablet 0  . SIMVASTATIN 40 MG PO TABS Oral Take 20 mg by mouth at bedtime.        Pulse 74  Temp(Src) 97.9 F (36.6 C) (Oral)  Resp 20  Ht 5\' 8"  (1.727 m)  Wt 188 lb (85.276 kg)  BMI 28.59 kg/m2  SpO2 99%  Physical Exam  Nursing note and vitals reviewed. Constitutional: He is oriented to person, place, and time. He appears well-developed and well-nourished. No distress.  HENT:  Head: Normocephalic and atraumatic.  Eyes: EOM are normal. Pupils are equal, round, and reactive to light.  Neck: Neck supple. No tracheal deviation present.  Cardiovascular: Normal rate.   Pulmonary/Chest: Effort normal. No respiratory distress.  Abdominal: Soft. He exhibits no distension.  Musculoskeletal: Normal range of motion. He exhibits no edema.       Spine non-tender   Neurological: He is alert and oriented to person, place, and time. No sensory deficit.  Skin: Skin is warm and dry.  Psychiatric: He has a normal mood and affect. His behavior is normal.    ED Course  Procedures (including critical care time) DIAGNOSTIC STUDIES: Oxygen Saturation is 99% on room air, normal by my interpretation.    COORDINATION OF CARE: 1:45PM Bryce Human, MD reviewed past medical records and imaging. Discussed clinical impression with Pt and prepared  for treatment.  Pt had been seen and worked up for back pain on 06/14/2011, and was prescribed Percocet for his back pain. He never filled his prescription, according to him he did not have money for it.  I asked if his family could not help him to get this prescription, and his daughter said she would get the prescription filled for him tomorrow.  He gets his medical care at the [facility]; I asked him to take copies of his lab and x-rays to his appointment for his doctors there to see.     1. Low back pain      I personally performed the services described in this documentation, which was scribed in my presence. The recorded information has been reviewed and considered.  Bryce Mcdonald, M.D.    Carleene Cooper III, MD 06/18/11 2006

## 2011-06-18 NOTE — Discharge Instructions (Signed)
Back Pain, Adult Low back pain is very common. About 1 in 5 people have back pain.The cause of low back pain is rarely dangerous. The pain often gets better over time.About half of people with a sudden onset of back pain feel better in just 2 weeks. About 8 in 10 people feel better by 6 weeks.  CAUSES Some common causes of back pain include:  Strain of the muscles or ligaments supporting the spine.   Wear and tear (degeneration) of the spinal discs.   Arthritis.   Direct injury to the back.  DIAGNOSIS Most of the time, the direct cause of low back pain is not known.However, back pain can be treated effectively even when the exact cause of the pain is unknown.Answering your caregiver's questions about your overall health and symptoms is one of the most accurate ways to make sure the cause of your pain is not dangerous. If your caregiver needs more information, he or she may order lab work or imaging tests (X-rays or MRIs).However, even if imaging tests show changes in your back, this usually does not require surgery. HOME CARE INSTRUCTIONS For many people, back pain returns.Since low back pain is rarely dangerous, it is often a condition that people can learn to manageon their own.   Remain active. It is stressful on the back to sit or stand in one place. Do not sit, drive, or stand in one place for more than 30 minutes at a time. Take short walks on level surfaces as soon as pain allows.Try to increase the length of time you walk each day.   Do not stay in bed.Resting more than 1 or 2 days can delay your recovery.   Do not avoid exercise or work.Your body is made to move.It is not dangerous to be active, even though your back may hurt.Your back will likely heal faster if you return to being active before your pain is gone.   Pay attention to your body when you bend and lift. Many people have less discomfortwhen lifting if they bend their knees, keep the load close to their  bodies,and avoid twisting. Often, the most comfortable positions are those that put less stress on your recovering back.   Find a comfortable position to sleep. Use a firm mattress and lie on your side with your knees slightly bent. If you lie on your back, put a pillow under your knees.   Only take over-the-counter or prescription medicines as directed by your caregiver. Over-the-counter medicines to reduce pain and inflammation are often the most helpful.Your caregiver may prescribe muscle relaxant drugs.These medicines help dull your pain so you can more quickly return to your normal activities and healthy exercise.   Put ice on the injured area.   Put ice in a plastic bag.   Place a towel between your skin and the bag.   Leave the ice on for 15 to 20 minutes, 3 to 4 times a day for the first 2 to 3 days. After that, ice and heat may be alternated to reduce pain and spasms.   Ask your caregiver about trying back exercises and gentle massage. This may be of some benefit.   Avoid feeling anxious or stressed.Stress increases muscle tension and can worsen back pain.It is important to recognize when you are anxious or stressed and learn ways to manage it.Exercise is a great option.  SEEK MEDICAL CARE IF:  You have pain that is not relieved with rest or medicine.   You have   pain that does not improve in 1 week.   You have new symptoms.   You are generally not feeling well.  SEEK IMMEDIATE MEDICAL CARE IF:   You have pain that radiates from your back into your legs.   You develop new bowel or bladder control problems.   You have unusual weakness or numbness in your arms or legs.   You develop nausea or vomiting.   You develop abdominal pain.   You feel faint.  Document Released: 02/06/2005 Document Revised: 01/26/2011 Document Reviewed: 06/27/2010 [facility] Patient Information 2012 [employer], [place].  MR. Gladden, YOUR X-RAYS ON April 24 SHOWED DEGENERATIVE CHANGES IN YOUR  LOWER BACK.  YOU NEED TO TAKE PAIN MEDICINE CALLED PERCOCET EVERY 4 HOURS IF NEEDED FOR PAIN.  YOU HAVE A PRESCRIPTION FOR THAT MEDICINE FROM YOUR LAST VISIT.  YOU CAN TAKE THE LAB TEST AND X-RAY RESULTS WITH YOU TO YOUR NEXT VISIT WITH DOCTORS AT THE [facility].

## 2011-07-09 ENCOUNTER — Emergency Department (HOSPITAL_COMMUNITY)
Admission: EM | Admit: 2011-07-09 | Discharge: 2011-07-09 | Disposition: A | Discharge status: Home / Self Care | Payer: Non-veteran care | Attending: Emergency Medicine | Admitting: Emergency Medicine

## 2011-07-09 ENCOUNTER — Encounter (HOSPITAL_COMMUNITY): Payer: Self-pay | Admitting: *Deleted

## 2011-07-09 DIAGNOSIS — Z86718 Personal history of other venous thrombosis and embolism: Secondary | ICD-10-CM | POA: Insufficient documentation

## 2011-07-09 DIAGNOSIS — E7800 Pure hypercholesterolemia, unspecified: Secondary | ICD-10-CM | POA: Insufficient documentation

## 2011-07-09 DIAGNOSIS — I2510 Atherosclerotic heart disease of native coronary artery without angina pectoris: Secondary | ICD-10-CM | POA: Insufficient documentation

## 2011-07-09 DIAGNOSIS — M109 Gout, unspecified: Secondary | ICD-10-CM | POA: Insufficient documentation

## 2011-07-09 DIAGNOSIS — Z79899 Other long term (current) drug therapy: Secondary | ICD-10-CM | POA: Insufficient documentation

## 2011-07-09 DIAGNOSIS — I10 Essential (primary) hypertension: Secondary | ICD-10-CM | POA: Insufficient documentation

## 2011-07-09 MED ORDER — PREDNISONE 50 MG PO TABS: 50.0000 mg | ORAL_TABLET | Freq: Every day | ORAL | Status: DC

## 2011-07-09 MED ORDER — OXYCODONE-ACETAMINOPHEN 5-325 MG PO TABS: 1.0000 | ORAL_TABLET | ORAL | Status: AC | PRN

## 2011-07-09 MED ORDER — OXYCODONE-ACETAMINOPHEN 5-325 MG PO TABS
1.0000 | ORAL_TABLET | Freq: Once | ORAL | Status: AC
  Administered 2011-07-09: 1 via ORAL
  Filled 2011-07-09: qty 1

## 2011-07-09 MED ORDER — PREDNISONE 20 MG PO TABS
60.0000 mg | ORAL_TABLET | Freq: Once | ORAL | Status: AC
  Administered 2011-07-09: 60 mg via ORAL
  Filled 2011-07-09: qty 3

## 2011-07-09 NOTE — ED Notes (Signed)
Pt c/o pain and swelling to right great toe and foot area, state that it started yesterday, denies any injury

## 2011-07-09 NOTE — ED Notes (Signed)
Pt c/o rt great toe pain and denies any injury to the area.

## 2011-07-09 NOTE — ED Provider Notes (Signed)
History  This chart was scribed for Bryce Booze, MD by Stevphen Meuse. This patient was seen in room APA06/APA06 and the patient's care was started at 7:59AM.  CSN: [id number]  Arrival date & time 07/09/11  0750   First MD Initiated Contact with Patient 07/09/11 0756      Chief Complaint  Patient presents with  . Foot Pain    (Consider location/radiation/quality/duration/timing/severity/associated sxs/prior treatment) Patient is a 78 y.o. male presenting with lower extremity pain. The history is provided by the patient. No language interpreter was used.  Foot Pain The symptoms are aggravated by walking and standing. The symptoms are relieved by medications and lying down (Bengay).   Bryce Mcdonald is a 78 y.o. male who presents to the Emergency Department complaining of approximetly 24 hours of gradual onset, gradually worsening right foot pain. Pt states that his pain is centeralized on his great toe. Pt reports applying bengay and alcohol with some relief. Pt denies any previous episodes. Pt reports his symptoms are aggravated by standing and walking. Pt denies any injury. Pt denies fever, neck pain, eye pain, SOB, chest pain, abdominal pain, dysuria, back pain, rash, HA, and adenopathy as associated symptoms. Pt has a h/o HTN, cancer, blood transfusion, coronary artery disease, DVT and PE. Pt denies a h/o smoking and alcohol use.   Past Medical History  Diagnosis Date  . Cancer   . Hypertension   . Hypercholesterolemia   . Blood transfusion   . Coronary artery disease   . DVT (deep venous thrombosis)   . PE (pulmonary embolism)     Past Surgical History  Procedure Date  . Abdominal surgery     x 3  . Cardiac catheterization 2008    No family history on file.  History  Substance Use Topics  . Smoking status: Never Smoker   . Smokeless tobacco: Not on file  . Alcohol Use: No      Review of Systems  All other systems reviewed and are negative.    Allergies    Review of patient's allergies indicates no known allergies.  Home Medications   Current Outpatient Rx  Name Route Sig Dispense Refill  . ENOXAPARIN SODIUM 80 MG/0.8ML [place] SOLN Subcutaneous Inject 80 mg into the skin daily.      Marland Kitchen METOPROLOL TARTRATE 50 MG PO TABS Oral Take 50 mg by mouth 2 (two) times daily.      Marland Kitchen OMEPRAZOLE 20 MG PO CPDR Oral Take 20 mg by mouth daily.      Bernadette Hoit SODIUM 8.6-50 MG PO TABS Oral Take 1 tablet by mouth daily as needed for constipation. 30 tablet 0  . SIMVASTATIN 40 MG PO TABS Oral Take 20 mg by mouth at bedtime.        Triage Vitals: BP 139/78  Pulse 70  Temp 97.9 F (36.6 C)  Resp 20  Ht 5\' 8"  (1.727 m)  Wt 188 lb (85.276 kg)  BMI 28.59 kg/m2  SpO2 95%  Physical Exam  Nursing note and vitals reviewed. Constitutional: He is oriented to person, place, and time. He appears well-developed.  HENT:  Head: Normocephalic and atraumatic.  Eyes: Conjunctivae are normal.  Neck: Normal range of motion. Neck supple.  Cardiovascular: Normal rate and regular rhythm.   Pulmonary/Chest: Effort normal and breath sounds normal.  Musculoskeletal: He exhibits tenderness ( PT has marked tenderness of the right MTP joint).       Pt has mild erythema and swelling. Pt straight leg raise is  negative.  Neurological: He is alert and oriented to person, place, and time.  Skin: Skin is warm and dry.  Psychiatric: He has a normal mood and affect. His behavior is normal.    ED Course  Procedures (including critical care time) DIAGNOSTIC STUDIES: Oxygen Saturation is 95% on room air, adequate by my interpretation.    COORDINATION OF CARE:   8:04AM Discussed ordering a blood test to check for abnormalities with pt a pt agreed. Advised pt that he may have gout.  Results for orders placed during the hospital encounter of 07/09/11  URIC ACID      Component Value Range   Uric Acid, Serum 6.6  4.0 - 7.8 (mg/dL)     1. Gout       MDM  Probable  gout. Uric acid does come back in the normal range but the upper level the normal range. Clinically, I still feel that he has gotten spread of the normal uric acid. He is treated with a prescription for Percocet and prednisone. Indomethacin has not used because of active anticoagulation with Lovenox.      I personally performed the services described in this documentation, which was scribed in my presence. The recorded information has been reviewed and considered.      Bryce Booze, MD 07/09/11 [phone]

## 2011-07-09 NOTE — Discharge Instructions (Signed)
Followup with your doctor this week.  Gout Gout is an inflammatory condition (arthritis) caused by a buildup of uric acid crystals in the joints. Uric acid is a chemical that is normally present in the blood. Under some circumstances, uric acid can form into crystals in your joints. This causes joint redness, soreness, and swelling (inflammation). Repeat attacks are common. Over time, uric acid crystals can form into masses (tophi) near a joint, causing disfigurement. Gout is treatable and often preventable. CAUSES  The disease begins with elevated levels of uric acid in the blood. Uric acid is produced by your body when it breaks down a naturally found substance called purines. This also happens when you eat certain foods such as meats and fish. Causes of an elevated uric acid level include:  Being passed down from parent to child (heredity).   Diseases that cause increased uric acid production (obesity, psoriasis, some cancers).   Excessive alcohol use.   Diet, especially diets rich in meat and seafood.   Medicines, including certain cancer-fighting drugs (chemotherapy), diuretics, and aspirin.   Chronic kidney disease. The kidneys are no longer able to remove uric acid well.   Problems with metabolism.  Conditions strongly associated with gout include:  Obesity.   High blood pressure.   High cholesterol.   Diabetes.  Not everyone with elevated uric acid levels gets gout. It is not understood why some people get gout and others do not. Surgery, joint injury, and eating too much of certain foods are some of the factors that can lead to gout. SYMPTOMS   An attack of gout comes on quickly. It causes intense pain with redness, swelling, and warmth in a joint.   Fever can occur.   Often, only one joint is involved. Certain joints are more commonly involved:   Base of the big toe.   Knee.   Ankle.   Wrist.   Finger.  Without treatment, an attack usually goes away in a few  days to weeks. Between attacks, you usually will not have symptoms, which is different from many other forms of arthritis. DIAGNOSIS  Your caregiver will suspect gout based on your symptoms and exam. Removal of fluid from the joint (arthrocentesis) is done to check for uric acid crystals. Your caregiver will give you a medicine that numbs the area (local anesthetic) and use a needle to remove joint fluid for exam. Gout is confirmed when uric acid crystals are seen in joint fluid, using a special microscope. Sometimes, blood, urine, and X-ray tests are also used. TREATMENT  There are 2 phases to gout treatment: treating the sudden onset (acute) attack and preventing attacks (prophylaxis). Treatment of an Acute Attack  Medicines are used. These include anti-inflammatory medicines or steroid medicines.   An injection of steroid medicine into the affected joint is sometimes necessary.   The painful joint is rested. Movement can worsen the arthritis.   You may use warm or cold treatments on painful joints, depending which works best for you.   Discuss the use of coffee, vitamin C, or cherries with your caregiver. These may be helpful treatment options.  Treatment to Prevent Attacks After the acute attack subsides, your caregiver may advise prophylactic medicine. These medicines either help your kidneys eliminate uric acid from your body or decrease your uric acid production. You may need to stay on these medicines for a very long time. The early phase of treatment with prophylactic medicine can be associated with an increase in acute gout attacks. For  this reason, during the first few months of treatment, your caregiver may also advise you to take medicines usually used for acute gout treatment. Be sure you understand your caregiver's directions. You should also discuss dietary treatment with your caregiver. Certain foods such as meats and fish can increase uric acid levels. Other foods such as dairy  can decrease levels. Your caregiver can give you a list of foods to avoid. HOME CARE INSTRUCTIONS   Do not take aspirin to relieve pain. This raises uric acid levels.   Only take over-the-counter or prescription medicines for pain, discomfort, or fever as directed by your caregiver.   Rest the joint as much as possible. When in bed, keep sheets and blankets off painful areas.   Keep the affected joint raised (elevated).   Use crutches if the painful joint is in your leg.   Drink enough water and fluids to keep your urine clear or pale yellow. This helps your body get rid of uric acid. Do not drink alcoholic beverages. They slow the passage of uric acid.   Follow your caregiver's dietary instructions. Pay careful attention to the amount of protein you eat. Your daily diet should emphasize fruits, vegetables, whole grains, and fat-free or low-fat milk products.   Maintain a healthy body weight.  SEEK MEDICAL CARE IF:   You have an oral temperature above 102 F (38.9 C).   You develop diarrhea, vomiting, or any side effects from medicines.   You do not feel better in 24 hours, or you are getting worse.  SEEK IMMEDIATE MEDICAL CARE IF:   Your joint becomes suddenly more tender and you have:   Chills.   An oral temperature above 102 F (38.9 C), not controlled by medicine.  MAKE SURE YOU:   Understand these instructions.   Will watch your condition.   Will get help right away if you are not doing well or get worse.  Document Released: 02/04/2000 Document Revised: 01/26/2011 Document Reviewed: 05/17/2009 [facility] [employer] Patient Information 2012 [place], [place].  Prednisone tablets What is this medicine? PREDNISONE (PRED ni sone) is a corticosteroid. It is commonly used to treat inflammation of the skin, joints, lungs, and other organs. Common conditions treated include asthma, allergies, and arthritis. It is also used for other conditions, such as blood disorders and diseases of the  adrenal glands. This medicine may be used for other purposes; ask your health care provider or pharmacist if you have questions. What should I tell my health care provider before I take this medicine? They need to know if you have any of these conditions: -Cushing's syndrome -diabetes -glaucoma -heart disease -high blood pressure -infection (especially a virus infection such as chickenpox, cold sores, or herpes) -kidney disease -liver disease -mental illness -myasthenia gravis -osteoporosis -seizures -stomach or intestine problems -thyroid disease -an unusual or allergic reaction to lactose, prednisone, other medicines, foods, dyes, or preservatives -pregnant or trying to get pregnant -breast-feeding How should I use this medicine? Take this medicine by mouth with a glass of water. Follow the directions on the prescription label. Take this medicine with food. If you are taking this medicine once a day, take it in the morning. Do not take more medicine than you are told to take. Do not suddenly stop taking your medicine because you may develop a severe reaction. Your doctor will tell you how much medicine to take. If your doctor wants you to stop the medicine, the dose may be slowly lowered over time to avoid any side  effects. Talk to your pediatrician regarding the use of this medicine in children. Special care may be needed. Overdosage: If you think you have taken too much of this medicine contact a poison control center or emergency room at once. NOTE: This medicine is only for you. Do not share this medicine with others. What if I miss a dose? If you miss a dose, take it as soon as you can. If it is almost time for your next dose, talk to your doctor or health care professional. You may need to miss a dose or take an extra dose. Do not take double or extra doses without advice. What may interact with this medicine? Do not take this medicine with any of the following  medications: -metyrapone -mifepristone This medicine may also interact with the following medications: -aminoglutethimide -amphotericin B -aspirin and aspirin-like medicines -barbiturates -certain medicines for diabetes, like glipizide or glyburide -cholestyramine -cholinesterase inhibitors -cyclosporine -digoxin -diuretics -ephedrine -male hormones, like estrogens and birth control pills -isoniazid -ketoconazole -NSAIDS, medicines for pain and inflammation, like ibuprofen or naproxen -phenytoin -rifampin -toxoids -vaccines -warfarin This list may not describe all possible interactions. Give your health care provider a list of all the medicines, herbs, non-prescription drugs, or dietary supplements you use. Also tell them if you smoke, drink alcohol, or use illegal drugs. Some items may interact with your medicine. What should I watch for while using this medicine? Visit your doctor or health care professional for regular checks on your progress. If you are taking this medicine over a prolonged period, carry an identification card with your name and address, the type and dose of your medicine, and your doctor's name and address. This medicine may increase your risk of getting an infection. Tell your doctor or health care professional if you are around anyone with measles or chickenpox, or if you develop sores or blisters that do not heal properly. If you are going to have surgery, tell your doctor or health care professional that you have taken this medicine within the last twelve months. Ask your doctor or health care professional about your diet. You may need to lower the amount of salt you eat. This medicine may affect blood sugar levels. If you have diabetes, check with your doctor or health care professional before you change your diet or the dose of your diabetic medicine. What side effects may I notice from receiving this medicine? Side effects that you should report to your  doctor or health care professional as soon as possible: -allergic reactions like skin rash, itching or hives, swelling of the face, lips, or tongue -changes in emotions or moods -changes in vision -depressed mood -eye pain -fever or chills, cough, sore throat, pain or difficulty passing urine -increased thirst -swelling of ankles, feet Side effects that usually do not require medical attention (report to your doctor or health care professional if they continue or are bothersome): -confusion, excitement, restlessness -headache -nausea, vomiting -skin problems, acne, thin and shiny skin -trouble sleeping -weight gain This list may not describe all possible side effects. Call your doctor for medical advice about side effects. You may report side effects to FDA at 1-800-FDA-1088. Where should I keep my medicine? Keep out of the reach of children. Store at room temperature between 15 and 30 degrees C (59 and 86 degrees F). Protect from light. Keep container tightly closed. Throw away any unused medicine after the expiration date. NOTE: This sheet is a summary. It may not cover all possible information. If you  have questions about this medicine, talk to your doctor, pharmacist, or health care provider.  2012, [employer]. (09/22/2010 10:57:14 AM)  Acetaminophen; Oxycodone tablets What is this medicine? ACETAMINOPHEN; OXYCODONE (a set a MEE noe fen; ox i KOE done) is a pain reliever. It is used to treat mild to moderate pain. This medicine may be used for other purposes; ask your health care provider or pharmacist if you have questions. What should I tell my health care provider before I take this medicine? They need to know if you have any of these conditions: -brain tumor -Crohn's disease, inflammatory bowel disease, or ulcerative colitis -drink more than 3 alcohol containing drinks per day -drug abuse or addiction -head injury -heart or circulation problems -kidney disease or  problems going to the bathroom -liver disease -lung disease, asthma, or breathing problems -an unusual or allergic reaction to acetaminophen, oxycodone, other opioid analgesics, other medicines, foods, dyes, or preservatives -pregnant or trying to get pregnant -breast-feeding How should I use this medicine? Take this medicine by mouth with a full glass of water. Follow the directions on the prescription label. Take your medicine at regular intervals. Do not take your medicine more often than directed. Talk to your pediatrician regarding the use of this medicine in children. Special care may be needed. Patients over 25 years old may have a stronger reaction and need a smaller dose. Overdosage: If you think you have taken too much of this medicine contact a poison control center or emergency room at once. NOTE: This medicine is only for you. Do not share this medicine with others. What if I miss a dose? If you miss a dose, take it as soon as you can. If it is almost time for your next dose, take only that dose. Do not take double or extra doses. What may interact with this medicine? -alcohol or medicines that contain alcohol -antihistamines -barbiturates like amobarbital, butalbital, butabarbital, methohexital, pentobarbital, phenobarbital, thiopental, and secobarbital -benztropine -drugs for bladder problems like solifenacin, trospium, oxybutynin, tolterodine, hyoscyamine, and methscopolamine -drugs for breathing problems like ipratropium and tiotropium -drugs for certain stomach or intestine problems like propantheline, homatropine methylbromide, glycopyrrolate, atropine, belladonna, and dicyclomine -general anesthetics like etomidate, ketamine, nitrous oxide, propofol, desflurane, enflurane, halothane, isoflurane, and sevoflurane -medicines for depression, anxiety, or psychotic disturbances -medicines for pain like codeine, morphine, pentazocine, buprenorphine, butorphanol, nalbuphine,  tramadol, and propoxyphene -medicines for sleep -muscle relaxants -naltrexone -phenothiazines like perphenazine, thioridazine, chlorpromazine, mesoridazine, fluphenazine, prochlorperazine, promazine, and trifluoperazine -scopolamine -trihexyphenidyl This list may not describe all possible interactions. Give your health care provider a list of all the medicines, herbs, non-prescription drugs, or dietary supplements you use. Also tell them if you smoke, drink alcohol, or use illegal drugs. Some items may interact with your medicine. What should I watch for while using this medicine? Tell your doctor or health care professional if your pain does not go away, if it gets worse, or if you have new or a different type of pain. You may develop tolerance to the medicine. Tolerance means that you will need a higher dose of the medication for pain relief. Tolerance is normal and is expected if you take this medicine for a long time. Do not suddenly stop taking your medicine because you may develop a severe reaction. Your body becomes used to the medicine. This does NOT mean you are addicted. Addiction is a behavior related to getting and using a drug for a nonmedical reason. If you have pain, you have a medical reason  to take pain medicine. Your doctor will tell you how much medicine to take. If your doctor wants you to stop the medicine, the dose will be slowly lowered over time to avoid any side effects. You may get drowsy or dizzy. Do not drive, use machinery, or do anything that needs mental alertness until you know how this medicine affects you. Do not stand or sit up quickly, especially if you are an older patient. This reduces the risk of dizzy or fainting spells. Alcohol may interfere with the effect of this medicine. Avoid alcoholic drinks. The medicine will cause constipation. Try to have a bowel movement at least every 2 to 3 days. If you do not have a bowel movement for 3 days, call your doctor or  health care professional. Do not take Tylenol (acetaminophen) or medicines that have acetaminophen with this medicine. Too much acetaminophen can be very dangerous. Many nonprescription medicines contain acetaminophen. Always read the labels carefully to avoid taking more acetaminophen. What side effects may I notice from receiving this medicine? Side effects that you should report to your doctor or health care professional as soon as possible: -allergic reactions like skin rash, itching or hives, swelling of the face, lips, or tongue -breathing difficulties, wheezing -confusion -light headedness or fainting spells -severe stomach pain -yellowing of the skin or the whites of the eyes Side effects that usually do not require medical attention (report to your doctor or health care professional if they continue or are bothersome): -dizziness -drowsiness -nausea -vomiting This list may not describe all possible side effects. Call your doctor for medical advice about side effects. You may report side effects to FDA at 1-800-FDA-1088. Where should I keep my medicine? Keep out of the reach of children. This medicine can be abused. Keep your medicine in a safe place to protect it from theft. Do not share this medicine with anyone. Selling or giving away this medicine is dangerous and against the law. Store at room temperature between 20 and 25 degrees C (68 and 77 degrees F). Keep container tightly closed. Protect from light. Flush any unused medicines down the toilet. Do not use the medicine after the expiration date. NOTE: This sheet is a summary. It may not cover all possible information. If you have questions about this medicine, talk to your doctor, pharmacist, or health care provider.  2012, [employer]. (01/06/2008 10:01:21 AM)

## 2011-10-15 ENCOUNTER — Emergency Department (HOSPITAL_COMMUNITY): Payer: Non-veteran care

## 2011-10-15 ENCOUNTER — Encounter (HOSPITAL_COMMUNITY): Payer: Self-pay | Admitting: *Deleted

## 2011-10-15 ENCOUNTER — Emergency Department (HOSPITAL_COMMUNITY)
Admission: EM | Admit: 2011-10-15 | Discharge: 2011-10-15 | Disposition: A | Discharge status: Home / Self Care | Payer: Non-veteran care | Attending: Emergency Medicine | Admitting: Emergency Medicine

## 2011-10-15 DIAGNOSIS — Z86718 Personal history of other venous thrombosis and embolism: Secondary | ICD-10-CM | POA: Insufficient documentation

## 2011-10-15 DIAGNOSIS — E7800 Pure hypercholesterolemia, unspecified: Secondary | ICD-10-CM | POA: Insufficient documentation

## 2011-10-15 DIAGNOSIS — R5381 Other malaise: Secondary | ICD-10-CM | POA: Insufficient documentation

## 2011-10-15 DIAGNOSIS — I2510 Atherosclerotic heart disease of native coronary artery without angina pectoris: Secondary | ICD-10-CM | POA: Insufficient documentation

## 2011-10-15 DIAGNOSIS — R5383 Other fatigue: Secondary | ICD-10-CM | POA: Insufficient documentation

## 2011-10-15 DIAGNOSIS — R0602 Shortness of breath: Secondary | ICD-10-CM | POA: Insufficient documentation

## 2011-10-15 DIAGNOSIS — I10 Essential (primary) hypertension: Secondary | ICD-10-CM | POA: Insufficient documentation

## 2011-10-15 DIAGNOSIS — R531 Weakness: Secondary | ICD-10-CM

## 2011-10-15 DIAGNOSIS — Z79899 Other long term (current) drug therapy: Secondary | ICD-10-CM | POA: Insufficient documentation

## 2011-10-15 LAB — BASIC METABOLIC PANEL
BUN: 14 mg/dL (ref 6–23)
CO2: 26 mEq/L (ref 19–32)
Calcium: 10.3 mg/dL (ref 8.4–10.5)
Chloride: 101 mEq/L (ref 96–112)
Creatinine, Ser: 0.93 mg/dL (ref 0.50–1.35)
GFR calc Af Amer: 85 mL/min — ABNORMAL LOW (ref >90)
GFR calc non Af Amer: 73 mL/min — ABNORMAL LOW (ref >90)
Glucose, Bld: 143 mg/dL — ABNORMAL HIGH (ref 70–99)
Potassium: 3.3 mEq/L — ABNORMAL LOW (ref 3.5–5.1)
Sodium: 138 mEq/L (ref 135–145)

## 2011-10-15 LAB — URINE MICROSCOPIC-ADD ON

## 2011-10-15 LAB — URINALYSIS, ROUTINE W REFLEX MICROSCOPIC
Bilirubin Urine: NEGATIVE
Glucose, UA: NEGATIVE mg/dL
Leukocytes, UA: NEGATIVE
Nitrite: NEGATIVE
Protein, ur: 30 mg/dL — AB
Specific Gravity, Urine: >1.03 — ABNORMAL HIGH (ref 1.005–1.030)
Urobilinogen, UA: 1 mg/dL (ref 0.0–1.0)
pH: 6 (ref 5.0–8.0)

## 2011-10-15 LAB — CBC
HCT: 34.4 % — ABNORMAL LOW (ref 39.0–52.0)
Hemoglobin: 11.5 g/dL — ABNORMAL LOW (ref 13.0–17.0)
MCH: 32.5 pg (ref 26.0–34.0)
MCHC: 33.4 g/dL (ref 30.0–36.0)
MCV: 97.2 fL (ref 78.0–100.0)
Platelets: 179 10*3/uL (ref 150–400)
RBC: 3.54 MIL/uL — ABNORMAL LOW (ref 4.22–5.81)
RDW: 13.3 % (ref 11.5–15.5)
WBC: 4.9 10*3/uL (ref 4.0–10.5)

## 2011-10-15 LAB — TROPONIN I: Troponin I: <0.3 ng/mL (ref <0.30)

## 2011-10-15 MED ORDER — POTASSIUM CHLORIDE CRYS ER 20 MEQ PO TBCR
40.0000 meq | EXTENDED_RELEASE_TABLET | Freq: Once | ORAL | Status: AC
  Administered 2011-10-15: 40 meq via ORAL
  Filled 2011-10-15: qty 2

## 2011-10-15 MED ORDER — GUAIFENESIN-CODEINE 100-10 MG/5ML PO SYRP: 5.0000 mL | ORAL_SOLUTION | Freq: Three times a day (TID) | ORAL | Status: AC | PRN

## 2011-10-15 MED ORDER — GUAIFENESIN-CODEINE 100-10 MG/5ML PO SOLN: 5.0000 mL | Freq: Once | ORAL | Status: DC

## 2011-10-15 NOTE — ED Provider Notes (Signed)
History   This chart was scribed for Bryce Razor, MD by [name] . The patient was seen in room APA06/APA06. Patient's care was started at 1145.    CSN: [id number]  Arrival date & time 10/15/11  1116   First MD Initiated Contact with Patient 10/15/11 1145      Chief Complaint  Patient presents with  . Fatigue  . Cough    (Consider location/radiation/quality/duration/timing/severity/associated sxs/prior treatment) HPI Bryce Mcdonald is a 78 y.o. male who presents to the Emergency Department complaining of persistent, moderate productive cough that started about 2 weeks ago. Pt reports associated SOB, generalized weakness, chills, rhinorrhea, congestion, nausea, diarrhea, intermittent dysuria, and chest pain. Pt describes chest pain as an intermittent blunt feeling lasting seconds at a time. Pt also complains of intermittent, bilaterally lower side pain. Pt states that his symptoms are worsening. Pt denies any vomiting, melena, bloody stools, rash, leg swelling, sore throat, ear pain, and fevers. Family denies any recent falls. Pt states that he was seen at [facility] hospital 2 weeks ago and was given Robitussin. Pt has a h/o colon and prostate CA in which he received treatment every 3 months.   Past Medical History  Diagnosis Date  . Cancer   . Hypertension   . Hypercholesterolemia   . Blood transfusion   . Coronary artery disease   . DVT (deep venous thrombosis)   . PE (pulmonary embolism)     Past Surgical History  Procedure Date  . Abdominal surgery     x 3  . Cardiac catheterization 2008    No family history on file.  History  Substance Use Topics  . Smoking status: Never Smoker   . Smokeless tobacco: Not on file  . Alcohol Use: No      Review of Systems  Constitutional: Positive for chills. Negative for fever.  HENT: Positive for congestion and rhinorrhea. Negative for ear pain and sore throat.   Respiratory: Positive for cough and shortness of  breath.   Cardiovascular: Positive for chest pain. Negative for leg swelling.  Gastrointestinal: Positive for nausea and diarrhea. Negative for vomiting and blood in stool.       No melena.   Genitourinary: Positive for dysuria.  Musculoskeletal:       Bilateral side pain.   Skin: Negative for rash.  Neurological: Positive for weakness.  All other systems reviewed and are negative.    Allergies  Review of patient's allergies indicates no known allergies.  Home Medications   Current Outpatient Rx  Name Route Sig Dispense Refill  . ENOXAPARIN SODIUM 80 MG/0.8ML [place] SOLN Subcutaneous Inject 80 mg into the skin daily.      Marland Kitchen METOPROLOL TARTRATE 50 MG PO TABS Oral Take 50 mg by mouth 2 (two) times daily.      Marland Kitchen OMEPRAZOLE 20 MG PO CPDR Oral Take 20 mg by mouth daily.      Marland Kitchen PREDNISONE 50 MG PO TABS Oral Take 1 tablet (50 mg total) by mouth daily. 5 tablet 0  . SENNOSIDES-DOCUSATE SODIUM 8.6-50 MG PO TABS Oral Take 1 tablet by mouth daily as needed for constipation. 30 tablet 0  . SIMVASTATIN 40 MG PO TABS Oral Take 20 mg by mouth at bedtime.        BP 145/69  Pulse 88  Temp 97.7 F (36.5 C) (Oral)  Resp 22  SpO2 93%  Physical Exam  Nursing note and vitals reviewed. Constitutional: He appears well-developed and well-nourished. No distress.  HENT:  Head: Normocephalic and atraumatic.  Eyes: Conjunctivae are normal. Pupils are equal, round, and reactive to light. Right eye exhibits no discharge. Left eye exhibits no discharge.  Neck: Normal range of motion. Neck supple.  Cardiovascular: Normal rate, regular rhythm and normal heart sounds.  Exam reveals no gallop and no friction rub.   No murmur heard. Pulmonary/Chest: Effort normal. No respiratory distress. He has no wheezes. He has rhonchi.       Scattered rhonchi on right.   Abdominal: Soft. He exhibits no distension. There is no tenderness.  Musculoskeletal: He exhibits no edema and no tenderness.  Lymphadenopathy:    He  has no cervical adenopathy.  Neurological: He is alert. No cranial nerve deficit.       Strength 5/5 throughout. Cranial nerves intact.   Skin: Skin is warm and dry.  Psychiatric: He has a normal mood and affect. His behavior is normal. Thought content normal.    ED Course  Procedures (including critical care time)  DIAGNOSTIC STUDIES: Oxygen Saturation is 96% on room air, adequate by my interpretation.    COORDINATION OF CARE:  12:10-Discussed planned course of treatment with the patient including chest-xray, blood work and UA, who is agreeable at this time.   Results for orders placed during the hospital encounter of 10/15/11  URINALYSIS, ROUTINE W REFLEX MICROSCOPIC      Component Value Range   Color, Urine YELLOW  YELLOW   APPearance HAZY (*) CLEAR   Specific Gravity, Urine >1.030 (*) 1.005 - 1.030   pH 6.0  5.0 - 8.0   Glucose, UA NEGATIVE  NEGATIVE mg/dL   Hgb urine dipstick TRACE (*) NEGATIVE   Bilirubin Urine NEGATIVE  NEGATIVE   Ketones, ur TRACE (*) NEGATIVE mg/dL   Protein, ur 30 (*) NEGATIVE mg/dL   Urobilinogen, UA 1.0  0.0 - 1.0 mg/dL   Nitrite NEGATIVE  NEGATIVE   Leukocytes, UA NEGATIVE  NEGATIVE  CBC      Component Value Range   WBC 4.9  4.0 - 10.5 K/uL   RBC 3.54 (*) 4.22 - 5.81 MIL/uL   Hemoglobin 11.5 (*) 13.0 - 17.0 g/dL   HCT 78.2 (*) 95.6 - 21.3 %   MCV 97.2  78.0 - 100.0 fL   MCH 32.5  26.0 - 34.0 pg   MCHC 33.4  30.0 - 36.0 g/dL   RDW 08.6  57.8 - 46.9 %   Platelets 179  150 - 400 K/uL  BASIC METABOLIC PANEL      Component Value Range   Sodium 138  135 - 145 mEq/L   Potassium 3.3 (*) 3.5 - 5.1 mEq/L   Chloride 101  96 - 112 mEq/L   CO2 26  19 - 32 mEq/L   Glucose, Bld 143 (*) 70 - 99 mg/dL   BUN 14  6 - 23 mg/dL   Creatinine, Ser 6.29  0.50 - 1.35 mg/dL   Calcium 52.8  8.4 - 41.3 mg/dL   GFR calc non Af Amer 73 (*) >90 mL/min   GFR calc Af Amer 85 (*) >90 mL/min  TROPONIN I      Component Value Range   Troponin I <0.30  <0.30 ng/mL    URINE MICROSCOPIC-ADD ON      Component Value Range   Squamous Epithelial / LPF FEW (*) RARE   WBC, UA 7-10  <3 WBC/hpf   RBC / HPF 7-10  <3 RBC/hpf   Bacteria, UA FEW (*) RARE     Dg Chest  2 View  10/15/2011  *RADIOLOGY REPORT*  Clinical Data: Cough and weakness.  CHEST - 2 VIEW  Comparison: 10/16/2010.  Findings: The cardiac silhouette, mediastinal and hilar contours are within normal limits and stable.  Stable emphysematous changes and areas of pulmonary scarring.  No infiltrates, edema or effusions.  IMPRESSION: Stable emphysematous changes and scarring.  No acute overlying pulmonary process.   Original Report Authenticated By: P. Loralie Champagne, M.D.    EKG:  Rhythm: normal sinus Vent. rate 82 BPM PR interval 216 ms QRS duration 114 ms QT/QTc 404/472 ms Axis: left Intervals: RBBB, 1st degree AV blovk ST segments: NS ST changes   1. Generalized weakness       MDM  89yM with generalized weakness. Minor lab abnormalities but doubt explain pt's symptaoms. Pt afebrile and well appearing. HD stable. Possible deconditioning, depression, viral illness. Low suspicion for emergent etiology. Return precautions discussed. Outpt fu otherwise.  I personally preformed the services scribed in my presence. The recorded information has been reviewed and considered. Bryce Razor, MD.        Bryce Razor, MD 10/18/11 1003

## 2011-10-15 NOTE — ED Notes (Signed)
Pt presents with c/o cough x 2 weeks, generalized weakness and  Diarrhea. Pt has progressively getting worse. Was seen at [facility] hospital 2 weeks ago was given robitussin. Pt has active diarrhea.

## 2011-10-15 NOTE — ED Notes (Signed)
Daughter at bedside is POA

## 2011-12-24 ENCOUNTER — Other Ambulatory Visit (HOSPITAL_COMMUNITY): Payer: Self-pay | Admitting: Emergency Medicine

## 2011-12-24 ENCOUNTER — Ambulatory Visit (HOSPITAL_COMMUNITY)
Admission: RE | Admit: 2011-12-24 | Discharge: 2011-12-24 | Disposition: A | Discharge status: Home / Self Care | Payer: Non-veteran care | Source: Ambulatory Visit | Attending: Emergency Medicine | Admitting: Emergency Medicine

## 2011-12-24 ENCOUNTER — Emergency Department (HOSPITAL_COMMUNITY)
Admission: EM | Admit: 2011-12-24 | Discharge: 2011-12-24 | Disposition: A | Discharge status: Home / Self Care | Payer: Non-veteran care | Attending: Emergency Medicine | Admitting: Emergency Medicine

## 2011-12-24 DIAGNOSIS — J441 Chronic obstructive pulmonary disease with (acute) exacerbation: Secondary | ICD-10-CM | POA: Insufficient documentation

## 2011-12-24 DIAGNOSIS — I2510 Atherosclerotic heart disease of native coronary artery without angina pectoris: Secondary | ICD-10-CM | POA: Insufficient documentation

## 2011-12-24 DIAGNOSIS — I10 Essential (primary) hypertension: Secondary | ICD-10-CM | POA: Insufficient documentation

## 2011-12-24 DIAGNOSIS — R059 Cough, unspecified: Secondary | ICD-10-CM | POA: Insufficient documentation

## 2011-12-24 DIAGNOSIS — R05 Cough: Secondary | ICD-10-CM

## 2012-03-02 ENCOUNTER — Encounter (HOSPITAL_COMMUNITY): Payer: Self-pay | Admitting: Emergency Medicine

## 2012-03-02 ENCOUNTER — Emergency Department (HOSPITAL_COMMUNITY): Payer: Non-veteran care

## 2012-03-02 ENCOUNTER — Emergency Department (HOSPITAL_COMMUNITY)
Admission: EM | Admit: 2012-03-02 | Discharge: 2012-03-02 | Disposition: A | Discharge status: Home / Self Care | Payer: Non-veteran care | Attending: Emergency Medicine | Admitting: Emergency Medicine

## 2012-03-02 DIAGNOSIS — Z79899 Other long term (current) drug therapy: Secondary | ICD-10-CM | POA: Insufficient documentation

## 2012-03-02 DIAGNOSIS — R059 Cough, unspecified: Secondary | ICD-10-CM | POA: Insufficient documentation

## 2012-03-02 DIAGNOSIS — E7800 Pure hypercholesterolemia, unspecified: Secondary | ICD-10-CM | POA: Insufficient documentation

## 2012-03-02 DIAGNOSIS — I2510 Atherosclerotic heart disease of native coronary artery without angina pectoris: Secondary | ICD-10-CM | POA: Insufficient documentation

## 2012-03-02 DIAGNOSIS — I10 Essential (primary) hypertension: Secondary | ICD-10-CM | POA: Insufficient documentation

## 2012-03-02 DIAGNOSIS — Z859 Personal history of malignant neoplasm, unspecified: Secondary | ICD-10-CM | POA: Insufficient documentation

## 2012-03-02 DIAGNOSIS — R05 Cough: Secondary | ICD-10-CM | POA: Insufficient documentation

## 2012-03-02 DIAGNOSIS — Z7901 Long term (current) use of anticoagulants: Secondary | ICD-10-CM | POA: Insufficient documentation

## 2012-03-02 DIAGNOSIS — Z86718 Personal history of other venous thrombosis and embolism: Secondary | ICD-10-CM | POA: Insufficient documentation

## 2012-03-02 DIAGNOSIS — R0602 Shortness of breath: Secondary | ICD-10-CM | POA: Insufficient documentation

## 2012-03-02 DIAGNOSIS — R0781 Pleurodynia: Secondary | ICD-10-CM

## 2012-03-02 DIAGNOSIS — Z86711 Personal history of pulmonary embolism: Secondary | ICD-10-CM | POA: Insufficient documentation

## 2012-03-02 DIAGNOSIS — R079 Chest pain, unspecified: Secondary | ICD-10-CM | POA: Insufficient documentation

## 2012-03-02 LAB — URINALYSIS, ROUTINE W REFLEX MICROSCOPIC
Bilirubin Urine: NEGATIVE
Glucose, UA: NEGATIVE mg/dL
Hgb urine dipstick: NEGATIVE
Protein, ur: NEGATIVE mg/dL
Urobilinogen, UA: 0.2 mg/dL (ref 0.0–1.0)

## 2012-03-02 LAB — CBC WITH DIFFERENTIAL/PLATELET
Basophils Relative: 0 % (ref 0–1)
HCT: 34.2 % — ABNORMAL LOW (ref 39.0–52.0)
Hemoglobin: 11.4 g/dL — ABNORMAL LOW (ref 13.0–17.0)
Lymphocytes Relative: 58 % — ABNORMAL HIGH (ref 12–46)
Lymphs Abs: 2.4 10*3/uL (ref 0.7–4.0)
MCHC: 33.3 g/dL (ref 30.0–36.0)
Monocytes Absolute: 0.4 10*3/uL (ref 0.1–1.0)
Monocytes Relative: 9 % (ref 3–12)
Neutro Abs: 1.3 10*3/uL — ABNORMAL LOW (ref 1.7–7.7)
Neutrophils Relative %: 32 % — ABNORMAL LOW (ref 43–77)
RBC: 3.64 MIL/uL — ABNORMAL LOW (ref 4.22–5.81)

## 2012-03-02 LAB — COMPREHENSIVE METABOLIC PANEL
Alkaline Phosphatase: 76 U/L (ref 39–117)
BUN: 28 mg/dL — ABNORMAL HIGH (ref 6–23)
CO2: 29 mEq/L (ref 19–32)
Chloride: 99 mEq/L (ref 96–112)
Creatinine, Ser: 1.46 mg/dL — ABNORMAL HIGH (ref 0.50–1.35)
GFR calc Af Amer: 48 mL/min — ABNORMAL LOW (ref >90)
GFR calc non Af Amer: 41 mL/min — ABNORMAL LOW (ref >90)
Glucose, Bld: 112 mg/dL — ABNORMAL HIGH (ref 70–99)
Potassium: 3.7 mEq/L (ref 3.5–5.1)
Total Bilirubin: 0.1 mg/dL — ABNORMAL LOW (ref 0.3–1.2)

## 2012-03-02 LAB — PROTIME-INR
INR: 0.99 (ref 0.00–1.49)
Prothrombin Time: 13 seconds (ref 11.6–15.2)

## 2012-03-02 MED ORDER — SODIUM CHLORIDE 0.9 % IV BOLUS (SEPSIS)
1000.0000 mL | Freq: Once | INTRAVENOUS | Status: AC
  Administered 2012-03-02: 1000 mL via INTRAVENOUS

## 2012-03-02 MED ORDER — IOHEXOL 350 MG/ML SOLN
100.0000 mL | Freq: Once | INTRAVENOUS | Status: AC | PRN
  Administered 2012-03-02: 100 mL via INTRAVENOUS

## 2012-03-02 MED ORDER — OXYCODONE-ACETAMINOPHEN 5-325 MG PO TABS
2.0000 | ORAL_TABLET | Freq: Once | ORAL | Status: AC
  Administered 2012-03-02: 2 via ORAL
  Filled 2012-03-02: qty 2

## 2012-03-02 NOTE — ED Notes (Signed)
Pt discharged. Pt stable at time of discharge. pt has no questions regarding discharge at this time. Pt voiced understanding of discharge instructions.  

## 2012-03-02 NOTE — ED Notes (Addendum)
Pt from home with family in route, has had left flank pain (per EMS) and decreased urine output for several days. On assessment patient has tenderness in left lower ribs, reports "my bowels aren't moving like they should" and "I'm not urinating much"

## 2012-03-02 NOTE — ED Provider Notes (Signed)
History     CSN: [id number]  Arrival date & time 03/02/12  7846   First MD Initiated Contact with Patient 03/02/12 1842      Chief Complaint  Patient presents with  . Flank Pain    (Consider location/radiation/quality/duration/timing/severity/associated sxs/prior treatment) HPI  Patient complaining of pain left flank pain began four days ago.  Patient states coughing for six months.  Patient states pain began with cough.  Pain is sharp and worse with movement or coughing.  SOB due to pain.  Denies fever, chills, vomiting, loss of appetite, frequency, or dysuria, chest pain.  .  Endorses cough productive of white sputum, diarrhea, one loose stool today.    Past Medical History  Diagnosis Date  . Cancer   . Hypertension   . Hypercholesterolemia   . Blood transfusion   . Coronary artery disease   . DVT (deep venous thrombosis)   . PE (pulmonary embolism)     Past Surgical History  Procedure Date  . Abdominal surgery     x 3  . Cardiac catheterization 2008    No family history on file.  History  Substance Use Topics  . Smoking status: Never Smoker   . Smokeless tobacco: Not on file  . Alcohol Use: No      Review of Systems  All other systems reviewed and are negative.    Allergies  Review of patient's allergies indicates no known allergies.  Home Medications   Current Outpatient Rx  Name  Route  Sig  Dispense  Refill  . DIPHENHYDRAMINE-APAP (SLEEP) 25-500 MG PO TABS   Oral   Take 1 tablet by mouth at bedtime as needed. Sleep aid         . ENOXAPARIN SODIUM 80 MG/0.8ML [place] SOLN   Subcutaneous   Inject 80 mg into the skin daily.           Marland Kitchen METOPROLOL TARTRATE 50 MG PO TABS   Oral   Take 50 mg by mouth 2 (two) times daily.           Marland Kitchen SIMVASTATIN 40 MG PO TABS   Oral   Take 20 mg by mouth at bedtime.             BP 138/61  Pulse 69  Temp 97.8 F (36.6 C) (Oral)  Resp 23  SpO2 96%  Physical Exam  Nursing note and vitals  reviewed. Constitutional: He is oriented to person, place, and time. He appears well-developed and well-nourished.  HENT:  Head: Normocephalic and atraumatic.  Nose: Nose normal.  Mouth/Throat: Oropharynx is clear and moist.       Poor dentition  Eyes: Conjunctivae normal are normal. Pupils are equal, round, and reactive to light.  Neck: Normal range of motion. Neck supple.  Cardiovascular: Normal rate, regular rhythm, normal heart sounds and intact distal pulses.   Pulmonary/Chest: Effort normal.       Patient with exquisite tenderness posterolateral lower left rib.   Abdominal: Soft. Bowel sounds are normal.  Musculoskeletal: Normal range of motion. He exhibits no edema and no tenderness.  Neurological: He is alert and oriented to person, place, and time.  Skin: Skin is warm and dry.  Psychiatric: He has a normal mood and affect. His behavior is normal. Judgment and thought content normal.    ED Course  Procedures (including critical care time)  Labs Reviewed - No data to display No results found.   No diagnosis found. Results for orders placed during the  hospital encounter of 03/02/12  CBC WITH DIFFERENTIAL      Component Value Range   WBC 4.1  4.0 - 10.5 K/uL   RBC 3.64 (*) 4.22 - 5.81 MIL/uL   Hemoglobin 11.4 (*) 13.0 - 17.0 g/dL   HCT 57.8 (*) 46.9 - 62.9 %   MCV 94.0  78.0 - 100.0 fL   MCH 31.3  26.0 - 34.0 pg   MCHC 33.3  30.0 - 36.0 g/dL   RDW 52.8  41.3 - 24.4 %   Platelets 171  150 - 400 K/uL   Neutrophils Relative 32 (*) 43 - 77 %   Neutro Abs 1.3 (*) 1.7 - 7.7 K/uL   Lymphocytes Relative 58 (*) 12 - 46 %   Lymphs Abs 2.4  0.7 - 4.0 K/uL   Monocytes Relative 9  3 - 12 %   Monocytes Absolute 0.4  0.1 - 1.0 K/uL   Eosinophils Relative 1  0 - 5 %   Eosinophils Absolute 0.0  0.0 - 0.7 K/uL   Basophils Relative 0  0 - 1 %   Basophils Absolute 0.0  0.0 - 0.1 K/uL  COMPREHENSIVE METABOLIC PANEL      Component Value Range   Sodium 136  135 - 145 mEq/L    Potassium 3.7  3.5 - 5.1 mEq/L   Chloride 99  96 - 112 mEq/L   CO2 29  19 - 32 mEq/L   Glucose, Bld 112 (*) 70 - 99 mg/dL   BUN 28 (*) 6 - 23 mg/dL   Creatinine, Ser 0.10 (*) 0.50 - 1.35 mg/dL   Calcium 27.2  8.4 - 53.6 mg/dL   Total Protein 7.2  6.0 - 8.3 g/dL   Albumin 3.9  3.5 - 5.2 g/dL   AST 12  0 - 37 U/L   ALT 7  0 - 53 U/L   Alkaline Phosphatase 76  39 - 117 U/L   Total Bilirubin 0.1 (*) 0.3 - 1.2 mg/dL   GFR calc non Af Amer 41 (*) >90 mL/min   GFR calc Af Amer 48 (*) >90 mL/min  PROTIME-INR      Component Value Range   Prothrombin Time 13.0  11.6 - 15.2 seconds   INR 0.99  0.00 - 1.49  APTT      Component Value Range   aPTT 36  24 - 37 seconds  URINALYSIS, ROUTINE W REFLEX MICROSCOPIC      Component Value Range   Color, Urine YELLOW  YELLOW   APPearance CLEAR  CLEAR   Specific Gravity, Urine 1.025  1.005 - 1.030   pH 6.0  5.0 - 8.0   Glucose, UA NEGATIVE  NEGATIVE mg/dL   Hgb urine dipstick NEGATIVE  NEGATIVE   Bilirubin Urine NEGATIVE  NEGATIVE   Ketones, ur NEGATIVE  NEGATIVE mg/dL   Protein, ur NEGATIVE  NEGATIVE mg/dL   Urobilinogen, UA 0.2  0.0 - 1.0 mg/dL   Nitrite NEGATIVE  NEGATIVE   Leukocytes, UA NEGATIVE  NEGATIVE      MDM  Patient with point tenderness over left posterior inferior lower rib.  No pe or rib fx identified on ct of chest.  Patient with increased creatinine from prior.  IV ns one liter infused.  Patient advised recheck of creatinine early next week. No uti seen on urinalysis.  Patient advised to increase oral fluid intake.        Hilario Quarry, MD 03/02/12 [phone]

## 2012-03-03 NOTE — ED Notes (Signed)
Pt's daughter called, stated she left d/c papers in exam room, discharge papers reprinted. And placed at registration desk for pick-up

## 2012-09-29 ENCOUNTER — Observation Stay (HOSPITAL_COMMUNITY)
Admission: EM | Admit: 2012-09-29 | Discharge: 2012-09-30 | Disposition: A | Discharge status: Home / Self Care | Payer: Non-veteran care | Attending: Internal Medicine | Admitting: Internal Medicine

## 2012-09-29 ENCOUNTER — Encounter (HOSPITAL_COMMUNITY): Payer: Self-pay

## 2012-09-29 ENCOUNTER — Emergency Department (HOSPITAL_COMMUNITY): Payer: Non-veteran care

## 2012-09-29 DIAGNOSIS — R0689 Other abnormalities of breathing: Secondary | ICD-10-CM

## 2012-09-29 DIAGNOSIS — I10 Essential (primary) hypertension: Secondary | ICD-10-CM | POA: Diagnosis present

## 2012-09-29 DIAGNOSIS — K439 Ventral hernia without obstruction or gangrene: Secondary | ICD-10-CM

## 2012-09-29 DIAGNOSIS — E785 Hyperlipidemia, unspecified: Secondary | ICD-10-CM | POA: Diagnosis present

## 2012-09-29 DIAGNOSIS — C189 Malignant neoplasm of colon, unspecified: Secondary | ICD-10-CM

## 2012-09-29 DIAGNOSIS — R0602 Shortness of breath: Secondary | ICD-10-CM | POA: Insufficient documentation

## 2012-09-29 DIAGNOSIS — N390 Urinary tract infection, site not specified: Secondary | ICD-10-CM

## 2012-09-29 DIAGNOSIS — C61 Malignant neoplasm of prostate: Secondary | ICD-10-CM

## 2012-09-29 DIAGNOSIS — I4891 Unspecified atrial fibrillation: Principal | ICD-10-CM | POA: Diagnosis present

## 2012-09-29 NOTE — ED Provider Notes (Signed)
CSN: [id number]     Arrival date & time 09/29/12  2310 History  This chart was scribed for Joya Gaskins, MD by Bennett Scrape, ED Scribe. This patient was seen in room APA02/APA02 and the patient's care was started at 11:18 PM.   Chief Complaint  Patient presents with  . Shortness of Breath    Patient is a 79 y.o. male presenting with shortness of breath. The history is provided by the patient and the EMS personnel. No language interpreter was used.  Shortness of Breath Onset quality:  Gradual Duration: several weeks. Timing:  Intermittent Progression:  Worsening Relieved by: albuterol neb. Risk factors: hx of cancer and hx of PE/DVT     HPI Comments: Bryce Mcdonald is a 79 y.o. male brought in by ambulance, who presents to the Emergency Department complaining of intermittent SOB for the past few weeks. Today, the SOB become more severe and was exacerbated with laying down. He reports associated intermittent left sided CP, bilateral leg swelling, diarrhea and non-specific abdominal pain. EMS reportedly found the pt in severe respiratory distress which improved with one 5 mg albuterol nebulizer en route with improvement. He denies syncope. He has a h/o CA, HTN and DVT/PE.  Past Medical History  Diagnosis Date  . Cancer   . Hypertension   . Hypercholesterolemia   . Blood transfusion   . Coronary artery disease   . DVT (deep venous thrombosis)   . PE (pulmonary embolism)    Past Surgical History  Procedure Laterality Date  . Abdominal surgery      x 3  . Cardiac catheterization  2008   No family history on file. History  Substance Use Topics  . Smoking status: Never Smoker   . Smokeless tobacco: Not on file  . Alcohol Use: No    Review of Systems  A complete 10 system review of systems was obtained and all systems are negative except as noted in the HPI and PMH.   Allergies  Review of patient's allergies indicates no known allergies.  Home Medications    Current Outpatient Rx  Name  Route  Sig  Dispense  Refill  . diphenhydramine-acetaminophen (TYLENOL PM EXTRA STRENGTH) 25-500 MG TABS   Oral   Take 1 tablet by mouth at bedtime as needed. Sleep aid         . enoxaparin (LOVENOX) 80 MG/0.8ML SOLN   Subcutaneous   Inject 80 mg into the skin daily.           Marland Kitchen levothyroxine (SYNTHROID, LEVOTHROID) 50 MCG tablet   Oral   Take 50 mcg by mouth daily before breakfast.         . lisinopril-hydrochlorothiazide (PRINZIDE,ZESTORETIC) 10-12.5 MG per tablet   Oral   Take 1 tablet by mouth daily.         . metoprolol (LOPRESSOR) 50 MG tablet   Oral   Take 50 mg by mouth 2 (two) times daily.           . simvastatin (ZOCOR) 40 MG tablet   Oral   Take 20 mg by mouth at bedtime.           Marland Kitchen terazosin (HYTRIN) 1 MG capsule   Oral   Take 1 mg by mouth at bedtime.          Triage Vitals: BP 109/60  Pulse 47  Resp 21  SpO2 99% BP 109/94  Pulse 46  Temp(Src) 98 F (36.7 C) (Oral)  Resp 18  SpO2  100%   Physical Exam  Nursing note and vitals reviewed.  CONSTITUTIONAL: Well developed/well nourished HEAD: Normocephalic/atraumatic EYES: EOMI/PERRL ENMT: Mucous membranes moist, poor dentition NECK: supple no meningeal signs, JVD noted SPINE:entire spine nontender GN:FAOZHYQM, bradycardic LUNGS: decreased breath sounds bilaterally, no apparent distress ABDOMEN: soft, nontender, no rebound or guarding GU:no cva tenderness NEURO: Pt is awake/alert, moves all extremitiesx4 EXTREMITIES: pulses normal, full ROM, no edema SKIN: warm, color normal PSYCH: no abnormalities of mood noted  ED Course   DIAGNOSTIC STUDIES: Oxygen Saturation is 99% on room air, normal by my interpretation.    COORDINATION OF CARE: 11:23 PM-Discussed treatment plan which includes CXR, CBC panel, BMP and troponin with pt at bedside and pt agreed to plan.  11:43 PM Pt poor historian, however his biggest issue is SOB.  His CXR is unremarkable.   He is not in resp. Distress.  His EKG is abnormal, ?slow afib.  Labs pending at this time  12:58 AM D/w cardiology dr Terressa Koyanagi He reviewed EKG He does not feel he needs acute treatment at this time but needs monitoring and to hold metoprolol 1:11 AM D/w hospitalist, will admit to tele OBS Pt will need monitoring since he is symptomatic    Procedures   Labs Reviewed  BASIC METABOLIC PANEL  CBC WITH DIFFERENTIAL  TROPONIN I  PROTIME-INR  PRO B NATRIURETIC PEPTIDE    MDM  Nursing notes including past medical history and social history reviewed and considered in documentation xrays reviewed and considered Labs/vital reviewed and considered Previous records reviewed and considered    Date: 09/29/2012 2317  Rate: 58  Rhythm: indeterminate  QRS Axis: left  Intervals: normal  ST/T Wave abnormalities: nonspecific ST changes  Conduction Disutrbances:right bundle branch block  Narrative Interpretation:   Old EKG Reviewed: rate is slower.  no consistent P waves seen.  RBBB and LAD seen in prior from 2012   Date: 09/29/2012 2327  Rate: 55  Rhythm: atrial fibrillation  QRS Axis: left  Intervals: normal  ST/T Wave abnormalities: nonspecific ST changes  Conduction Disutrbances:right bundle branch block  Narrative Interpretation:   Old EKG Reviewed: no change from EKG earlier tonight     I personally performed the services described in this documentation, which was scribed in my presence. The recorded information has been reviewed and is accurate.       Joya Gaskins, MD 09/30/12 [phone]

## 2012-09-29 NOTE — ED Notes (Signed)
Patient denies chest pain at this time. Reports has had chest pain throughout the day today.

## 2012-09-29 NOTE — ED Notes (Signed)
Pt called ems for sob, reportedly found pt in severe resp distress, given 5 mg albuterol neb which is complete on arrival here.  Pt denies cp or other complaints

## 2012-09-30 ENCOUNTER — Encounter (HOSPITAL_COMMUNITY): Payer: Self-pay | Admitting: Internal Medicine

## 2012-09-30 DIAGNOSIS — R0609 Other forms of dyspnea: Secondary | ICD-10-CM

## 2012-09-30 DIAGNOSIS — I10 Essential (primary) hypertension: Secondary | ICD-10-CM

## 2012-09-30 DIAGNOSIS — I4891 Unspecified atrial fibrillation: Secondary | ICD-10-CM | POA: Diagnosis present

## 2012-09-30 DIAGNOSIS — R0689 Other abnormalities of breathing: Secondary | ICD-10-CM | POA: Diagnosis present

## 2012-09-30 DIAGNOSIS — E785 Hyperlipidemia, unspecified: Secondary | ICD-10-CM

## 2012-09-30 LAB — HEPATIC FUNCTION PANEL
ALT: 6 U/L (ref 0–53)
AST: 12 U/L (ref 0–37)
Albumin: 3.7 g/dL (ref 3.5–5.2)
Alkaline Phosphatase: 69 U/L (ref 39–117)
Total Bilirubin: 0.3 mg/dL (ref 0.3–1.2)
Total Protein: 6.6 g/dL (ref 6.0–8.3)

## 2012-09-30 LAB — CBC
HCT: 35.3 % — ABNORMAL LOW (ref 39.0–52.0)
Hemoglobin: 11.3 g/dL — ABNORMAL LOW (ref 13.0–17.0)
RBC: 3.63 MIL/uL — ABNORMAL LOW (ref 4.22–5.81)
WBC: 3.7 10*3/uL — ABNORMAL LOW (ref 4.0–10.5)

## 2012-09-30 LAB — BASIC METABOLIC PANEL
BUN: 22 mg/dL (ref 6–23)
BUN: 24 mg/dL — ABNORMAL HIGH (ref 6–23)
CO2: 30 mEq/L (ref 19–32)
Calcium: 10.3 mg/dL (ref 8.4–10.5)
Chloride: 107 mEq/L (ref 96–112)
GFR calc Af Amer: 61 mL/min — ABNORMAL LOW (ref >90)
GFR calc non Af Amer: 49 mL/min — ABNORMAL LOW (ref >90)
GFR calc non Af Amer: 53 mL/min — ABNORMAL LOW (ref >90)
Glucose, Bld: 142 mg/dL — ABNORMAL HIGH (ref 70–99)
Potassium: 4.2 mEq/L (ref 3.5–5.1)
Sodium: 139 mEq/L (ref 135–145)
Sodium: 144 mEq/L (ref 135–145)

## 2012-09-30 LAB — CBC WITH DIFFERENTIAL/PLATELET
Basophils Relative: 0 % (ref 0–1)
Eosinophils Absolute: 0 10*3/uL (ref 0.0–0.7)
Eosinophils Relative: 1 % (ref 0–5)
Hemoglobin: 12.4 g/dL — ABNORMAL LOW (ref 13.0–17.0)
Lymphs Abs: 1.8 10*3/uL (ref 0.7–4.0)
MCH: 31.3 pg (ref 26.0–34.0)
MCHC: 31.8 g/dL (ref 30.0–36.0)
MCV: 98.5 fL (ref 78.0–100.0)
Monocytes Relative: 8 % (ref 3–12)
RBC: 3.96 MIL/uL — ABNORMAL LOW (ref 4.22–5.81)

## 2012-09-30 LAB — TROPONIN I: Troponin I: <0.3 ng/mL (ref <0.30)

## 2012-09-30 LAB — TSH: TSH: 1.598 u[IU]/mL (ref 0.350–4.500)

## 2012-09-30 LAB — MAGNESIUM: Magnesium: 1.9 mg/dL (ref 1.5–2.5)

## 2012-09-30 MED ORDER — ACETAMINOPHEN 325 MG PO TABS: 650.0000 mg | ORAL_TABLET | ORAL | Status: DC | PRN

## 2012-09-30 MED ORDER — SODIUM CHLORIDE 0.9 % IJ SOLN: 3.0000 mL | Freq: Two times a day (BID) | INTRAMUSCULAR | Status: DC

## 2012-09-30 MED ORDER — POTASSIUM CHLORIDE IN NACL 20-0.9 MEQ/L-% IV SOLN
INTRAVENOUS | Status: DC
  Administered 2012-09-30: 05:00:00 via INTRAVENOUS

## 2012-09-30 MED ORDER — BIOTENE DRY MOUTH MT LIQD
15.0000 mL | Freq: Two times a day (BID) | OROMUCOSAL | Status: DC
Start: 2012-09-30 — End: 2012-09-30
  Administered 2012-09-30: 15 mL via OROMUCOSAL

## 2012-09-30 MED ORDER — IPRATROPIUM BROMIDE 0.02 % IN SOLN: 0.5000 mg | Freq: Four times a day (QID) | RESPIRATORY_TRACT | Status: DC | PRN

## 2012-09-30 MED ORDER — LORATADINE 10 MG PO TABS: 10.0000 mg | ORAL_TABLET | Freq: Every day | ORAL | Status: DC

## 2012-09-30 MED ORDER — LEVALBUTEROL HCL 1.25 MG/0.5ML IN NEBU
1.2500 mg | INHALATION_SOLUTION | Freq: Four times a day (QID) | RESPIRATORY_TRACT | Status: DC | PRN
Start: 2012-09-30 — End: 2012-09-30

## 2012-09-30 MED ORDER — ENOXAPARIN SODIUM 80 MG/0.8ML ~~LOC~~ SOLN
80.0000 mg | SUBCUTANEOUS | Status: DC
  Administered 2012-09-30: 80 mg via SUBCUTANEOUS
  Filled 2012-09-30: qty 0.8

## 2012-09-30 MED ORDER — ONDANSETRON HCL 4 MG/2ML IJ SOLN: 4.0000 mg | INTRAMUSCULAR | Status: DC | PRN

## 2012-09-30 MED ORDER — OFF THE BEAT BOOK
Freq: Once | Status: DC
  Filled 2012-09-30: qty 1

## 2012-09-30 NOTE — H&P (Signed)
[facility] History and Physical  Bryce Mcdonald  ZOX:[id number]  DOB: 12-03-1921   DOA: 09/30/2012   PCP:   [facility] in [place]  Chief Complaint:  Doesn't feel right feels as if he is going crazy  HPI: Bryce Mcdonald is a 79 y.o. male.   Elderly African American gentleman who lives alone, and is unable to be very specific about why he decided to call EMS to take him to hospital. His complaints include just feeling very sick feeling as if he was going crazy, left-sided chest pain; EMS report that when they arrived he was very short of breath and he improved with nebulization.   In the emergency room he was found to be in A. fib with a bradycardic response; he is noted to be on a full dose anticoagulations with Lovenox and has a questionable past history of pulmonary embolus per the emergency room physician. His situation was discussed between the emergency room physician and the on-call cardiologist at [facility], and it was felt there was reason to admit him to [facility] for observation.  The patient says that we are in the devil's season, which he describes as May to September, and any type of strange thing can happen in this time.  History was attempted by her by doing a full review of systems and he describes:  Chronic cough; runny nose; sore throat Diarrhea x 4-5 episodes; started this morning; dificulty urnination today, but usually;  Dizziness, lightheadedness, today He is prone to anxiety He continues to take properly injections for prostate cancer  He lives alone does his own cooking and cleaning but has a daughter that looks in on [place] of Systems:  He denies any other problems on a full review of systems   Past Medical History  Diagnosis Date  . Cancer   . Hypertension   . Hypercholesterolemia   . Blood transfusion   . Coronary artery disease   . DVT (deep venous thrombosis)   . PE (pulmonary embolism)     Past Surgical History  Procedure  Laterality Date  . Abdominal surgery      x 3  . Cardiac catheterization  2008    Medications:  HOME MEDS: Prior to Admission medications   Medication Sig Start Date End Date Taking? Authorizing Provider  diphenhydramine-acetaminophen (TYLENOL PM EXTRA STRENGTH) 25-500 MG TABS Take 1 tablet by mouth at bedtime as needed. Sleep aid   Yes Historical Provider, MD  enoxaparin (LOVENOX) 80 MG/0.8ML SOLN Inject 80 mg into the skin daily.     Yes Historical Provider, MD  levothyroxine (SYNTHROID, LEVOTHROID) 50 MCG tablet Take 50 mcg by mouth daily before breakfast.   Yes Historical Provider, MD  lisinopril-hydrochlorothiazide (PRINZIDE,ZESTORETIC) 10-12.5 MG per tablet Take 1 tablet by mouth daily.   Yes Historical Provider, MD  metoprolol (LOPRESSOR) 50 MG tablet Take 50 mg by mouth 2 (two) times daily.     Yes Historical Provider, MD  simvastatin (ZOCOR) 40 MG tablet Take 20 mg by mouth at bedtime.     Yes Historical Provider, MD  terazosin (HYTRIN) 1 MG capsule Take 1 mg by mouth at bedtime.   Yes Historical Provider, MD     Allergies:  No Known Allergies  Social History:   reports that he has never smoked. He does not have any smokeless tobacco history on file. He reports that he does not drink alcohol or use illicit drugs.  Family History: No family history on file.   Physical  Exam: Filed Vitals:   09/30/12 0100 09/30/12 0147 09/30/12 0200 09/30/12 0205  BP: 109/94 103/38  176/53  Pulse: 46 46 101 60  Temp:   97.6 F (36.4 C)   TempSrc:   Oral   Resp: 18 13 23 21   Height:   5\' 8"  (1.727 m)   Weight:   79.8 kg (175 lb 14.8 oz)   SpO2: 100% 100% 83% 97%   Blood pressure 176/53, pulse 60, temperature 97.6 F (36.4 C), temperature source Oral, resp. rate 21, height 5\' 8"  (1.727 m), weight 79.8 kg (175 lb 14.8 oz), SpO2 97.00%. Body mass index is 26.76 kg/(m^2).   GEN:  Pleasant elderly Caucasian gentleman lying bed in no acute distress; cooperative with exam; a little hard  of hearing PSYCH:  alert and oriented x3;  neither anxious nor depressed; affect is appropriate. HEENT: Mucous membranes pink and anicteric; PERRLA; EOM intact; no cervical lymphadenopathy nor thyromegaly o no JVD; poor dentition careous teeth Breasts:: Not examined CHEST WALL: No tenderness CHEST: End expiratory wheezing ; HEART: Irregularly irregular rhythm, no longer bradycardic; no murmurs rubs or gallops BACK: Mild kyphosis no scoliosis; no CVA tenderness ABDOMEN: Obese, soft non-tender; no masses, no organomegaly, normal abdominal bowel sounds; Rectal Exam: Not done EXTREMITIES:  age-appropriate arthropathy of the hands and knees; no edema; no ulcerations. Genitalia: not examined PULSES: 2+ and symmetric SKIN: Normal hydration no rash or ulceration CNS: Cranial nerves 2-12 grossly intact no focal lateralizing neurologic deficit   Labs on Admission:  Basic Metabolic Panel:  Recent Labs Lab 09/29/12 2348  NA 139  K 3.9  CL 100  CO2 30  GLUCOSE 142*  BUN 24*  CREATININE 1.26  CALCIUM 10.3   Liver Function Tests: No results found for this basename: AST, ALT, ALKPHOS, BILITOT, PROT, ALBUMIN,  in the last 168 hours No results found for this basename: LIPASE, AMYLASE,  in the last 168 hours No results found for this basename: AMMONIA,  in the last 168 hours CBC:  Recent Labs Lab 09/29/12 2348  WBC 4.2  NEUTROABS 2.0  HGB 12.4*  HCT 39.0  MCV 98.5  PLT 165   Cardiac Enzymes:  Recent Labs Lab 09/29/12 2348  TROPONINI <0.30   BNP: No components found with this basename: POCBNP,  D-dimer: No components found with this basename: D-DIMER,  CBG: No results found for this basename: GLUCAP,  in the last 168 hours  Radiological Exams on Admission: Dg Chest Portable 1 View  09/29/2012   *RADIOLOGY REPORT*  Clinical Data: Shortness of breath.  PORTABLE CHEST - 1 VIEW  Comparison: Chest x-ray 12/24/2011.  Findings: Bibasilar linear opacities are similar to prior  examinations, most compatible with areas of mild chronic scarring. No acute consolidative airspace disease.  No pleural effusions.  No evidence of pulmonary edema.  Heart size is within normal limits. Upper mediastinal contours are unremarkable.  Atherosclerosis of the thoracic aorta.  IMPRESSION: 1.  No radiographic evidence of acute cardiopulmonary disease. 2.  Bibasilar scarring is unchanged. 3.  Atherosclerosis.   Original Report Authenticated By: Trudie Reed, M.D.    EKG: Independently reviewed. Afib; rate 55  Assessment/Plan   Active Problems:   Atrial fibrillation with slow ventricular response   Difficulty breathing Diarrhea Hypertension  PLAN: We'll admit this gentleman for observation; Continiue anti-coagulation with Lovenox but will hold his beta blockers, and other antihypertensives for the time being; When necessary nebulization with ipratropium and Xopenex Request medical records from the [facility] Check stool  for C. Difficile, although I am not aware that any diarrhea has been noted since he's been in the hospital Check urinalysis  Other plans as per orders.  Code Status: He confirms he is a full code   Lacole Komorowski Nocturnist [facility] Pager [phone]   09/30/2012, 3:06 AM

## 2012-09-30 NOTE — Discharge Summary (Signed)
Physician Discharge Summary  Bryce Mcdonald ZOX:[id number] DOB: 1921-09-17 DOA: 09/29/2012  PCP: No primary provider on file.  Admit date: 09/29/2012 Discharge date: 09/30/2012  Time spent: Less than 30 minutes  Recommendations for Outpatient Follow-up:  1. Follow with primary care physician at the [facility].   Discharge Diagnoses:  1. Atrial fibrillation with slow ventricular response. Medications adjusted. 2. Hypertension, controlled. 3. Hyperlipidemia.   Discharge Condition: Stable and improved.  Diet recommendation: Heart healthy diet.  Filed Weights   09/30/12 0200 09/30/12 0500  Weight: 79.8 kg (175 lb 14.8 oz) 80.4 kg (177 lb 4 oz)    History of present illness:  This 79 year old man was admitted yesterday with symptoms of feeling unwell and feeling as if he was going crazy. He had nonspecific left-sided chest pain. Please see initial history as outlined below: HPI:  Bryce Mcdonald is a 79 y.o. male. Elderly African American gentleman who lives alone, and is unable to be very specific about why he decided to call EMS to take him to hospital. His complaints include just feeling very sick feeling as if he was going crazy, left-sided chest pain; EMS report that when they arrived he was very short of breath and he improved with nebulization.  In the emergency room he was found to be in A. fib with a bradycardic response; he is noted to be on a full dose anticoagulations with Lovenox and has a questionable past history of pulmonary embolus per the emergency room physician. His situation was discussed between the emergency room physician and the on-call cardiologist at [facility], and it was felt there was reason to admit him to [facility] for observation.  The patient says that we are in the devil's season, which he describes as May to September, and any type of strange thing can happen in this time.  History was attempted by her by doing a full review of systems and he describes:   Chronic cough; runny nose; sore throat  Diarrhea x 4-5 episodes; started this morning;  dificulty urnination today, but usually;  Dizziness, lightheadedness, today  He is prone to anxiety  He continues to take properly injections for prostate cancer  Hospital Course:  The patient was admitted last night and serial cardiac enzymes have been negative. His beta blocker has been discontinued in view of slow ventricular response to his atrial fibrillation. He continues on anticoagulation with Lovenox. He feels much improved this morning but he does admit that yesterday he was feeling unwell and felt like he was going crazy. There are no other specific symptoms. He is now stable for discharge. [facility]  Procedures:  None.   Consultations:  None.  Discharge Exam: Filed Vitals:   09/30/12 0700  BP:   Pulse:   Temp: 97.6 F (36.4 C)  Resp:     General: He looks systemically well. He is alert and orientated. Cardiovascular: Heart sounds are present in atrial fibrillation with a ventricular response of around 60. No signs of heart failure. Respiratory: Lung fields are clear. He is alert and orientated without any focal neurological signs.  Discharge Instructions  Discharge Orders   Future Orders Complete By Expires     Diet - low sodium heart healthy  As directed     Increase activity slowly  As directed         Medication List    STOP taking these medications       metoprolol  50 MG tablet  Commonly known as:  LOPRESSOR     TYLENOL PM EXTRA STRENGTH 25-500 MG Tabs  Generic drug:  diphenhydramine-acetaminophen      TAKE these medications       enoxaparin 80 MG/0.8ML injection  Commonly known as:  LOVENOX  Inject 80 mg into the skin daily.     levothyroxine 50 MCG tablet  Commonly known as:  SYNTHROID, LEVOTHROID  Take 50 mcg by mouth daily before breakfast.     lisinopril-hydrochlorothiazide 10-12.5 MG per  tablet  Commonly known as:  PRINZIDE,ZESTORETIC  Take 1 tablet by mouth daily.     simvastatin 40 MG tablet  Commonly known as:  ZOCOR  Take 20 mg by mouth at bedtime.     terazosin 1 MG capsule  Commonly known as:  HYTRIN  Take 1 mg by mouth at bedtime.       No Known Allergies    The results of significant diagnostics from this hospitalization (including imaging, microbiology, ancillary and laboratory) are listed below for reference.    Significant Diagnostic Studies: Dg Chest Portable 1 View  09/29/2012   *RADIOLOGY REPORT*  Clinical Data: Shortness of breath.  PORTABLE CHEST - 1 VIEW  Comparison: Chest x-ray 12/24/2011.  Findings: Bibasilar linear opacities are similar to prior examinations, most compatible with areas of mild chronic scarring. No acute consolidative airspace disease.  No pleural effusions.  No evidence of pulmonary edema.  Heart size is within normal limits. Upper mediastinal contours are unremarkable.  Atherosclerosis of the thoracic aorta.  IMPRESSION: 1.  No radiographic evidence of acute cardiopulmonary disease. 2.  Bibasilar scarring is unchanged. 3.  Atherosclerosis.   Original Report Authenticated By: Trudie Reed, M.D.    Microbiology: Recent Results (from the past 240 hour(s))  MRSA PCR SCREENING     Status: None   Collection Time    09/30/12  1:50 AM      Result Value Range Status   MRSA by PCR NEGATIVE  NEGATIVE Final   Comment:            The GeneXpert MRSA Assay (FDA     approved for NASAL specimens     only), is one component of a     comprehensive MRSA colonization     surveillance program. It is not     intended to diagnose MRSA     infection nor to guide or     monitor treatment for     MRSA infections.     Labs: Basic Metabolic Panel:  Recent Labs Lab 09/29/12 2348 09/30/12 0552  NA 139 144  K 3.9 4.2  CL 100 107  CO2 30 30  GLUCOSE 142* 105*  BUN 24* 22  CREATININE 1.26 1.19  CALCIUM 10.3 10.0  MG  --  1.9   Liver  Function Tests:  Recent Labs Lab 09/30/12 0552  AST 12  ALT 6  ALKPHOS 69  BILITOT 0.3  PROT 6.6  ALBUMIN 3.7     CBC:  Recent Labs Lab 09/29/12 2348 09/30/12 0552  WBC 4.2 3.7*  NEUTROABS 2.0  --   HGB 12.4* 11.3*  HCT 39.0 35.3*  MCV 98.5 97.2  PLT 165 150   Cardiac Enzymes:  Recent Labs Lab 09/29/12 2348 09/30/12 0552  TROPONINI <0.30 <0.30   BNP: BNP (last 3 results)  Recent Labs  09/29/12 2348  PROBNP 2067.0*         Signed:  Sheba Whaling C  [facility] 09/30/2012, 8:16 AM

## 2012-09-30 NOTE — Progress Notes (Signed)
Pt was educated about atrial fib and when to call the physician or EMS. Daughter is here to pick up pt to take him back to [place] to assisted living facility. Instructed patient and daughter that pt is to stop taking the metoprolol and acetaminophen products. Pt was transported via wheelchair to daughter's personal vehicle along with educational materials and personal hygiene products. However, pt left his OFF the Beat book behind and I will try to contact granddaughter that works here at [employer] to give to him.

## 2012-09-30 NOTE — Progress Notes (Signed)
UR chart review completed.  

## 2012-10-09 ENCOUNTER — Observation Stay (HOSPITAL_COMMUNITY)
Admission: EM | Admit: 2012-10-09 | Discharge: 2012-10-11 | Disposition: A | Discharge status: Home / Self Care | Payer: Non-veteran care | Attending: Internal Medicine | Admitting: Internal Medicine

## 2012-10-09 ENCOUNTER — Emergency Department (HOSPITAL_COMMUNITY): Payer: Non-veteran care

## 2012-10-09 ENCOUNTER — Encounter (HOSPITAL_COMMUNITY): Payer: Self-pay | Admitting: *Deleted

## 2012-10-09 DIAGNOSIS — I82409 Acute embolism and thrombosis of unspecified deep veins of unspecified lower extremity: Secondary | ICD-10-CM | POA: Insufficient documentation

## 2012-10-09 DIAGNOSIS — C189 Malignant neoplasm of colon, unspecified: Secondary | ICD-10-CM

## 2012-10-09 DIAGNOSIS — C61 Malignant neoplasm of prostate: Secondary | ICD-10-CM

## 2012-10-09 DIAGNOSIS — I420 Dilated cardiomyopathy: Secondary | ICD-10-CM | POA: Diagnosis present

## 2012-10-09 DIAGNOSIS — K439 Ventral hernia without obstruction or gangrene: Secondary | ICD-10-CM

## 2012-10-09 DIAGNOSIS — E039 Hypothyroidism, unspecified: Secondary | ICD-10-CM | POA: Insufficient documentation

## 2012-10-09 DIAGNOSIS — R918 Other nonspecific abnormal finding of lung field: Secondary | ICD-10-CM | POA: Insufficient documentation

## 2012-10-09 DIAGNOSIS — R1013 Epigastric pain: Secondary | ICD-10-CM | POA: Insufficient documentation

## 2012-10-09 DIAGNOSIS — I2510 Atherosclerotic heart disease of native coronary artery without angina pectoris: Secondary | ICD-10-CM | POA: Insufficient documentation

## 2012-10-09 DIAGNOSIS — D72819 Decreased white blood cell count, unspecified: Secondary | ICD-10-CM

## 2012-10-09 DIAGNOSIS — E119 Type 2 diabetes mellitus without complications: Secondary | ICD-10-CM | POA: Insufficient documentation

## 2012-10-09 DIAGNOSIS — N390 Urinary tract infection, site not specified: Secondary | ICD-10-CM

## 2012-10-09 DIAGNOSIS — D649 Anemia, unspecified: Secondary | ICD-10-CM | POA: Insufficient documentation

## 2012-10-09 DIAGNOSIS — Z86711 Personal history of pulmonary embolism: Secondary | ICD-10-CM | POA: Diagnosis present

## 2012-10-09 DIAGNOSIS — I4891 Unspecified atrial fibrillation: Secondary | ICD-10-CM | POA: Insufficient documentation

## 2012-10-09 DIAGNOSIS — I4510 Unspecified right bundle-branch block: Secondary | ICD-10-CM | POA: Insufficient documentation

## 2012-10-09 DIAGNOSIS — E785 Hyperlipidemia, unspecified: Secondary | ICD-10-CM

## 2012-10-09 DIAGNOSIS — I517 Cardiomegaly: Secondary | ICD-10-CM | POA: Diagnosis present

## 2012-10-09 DIAGNOSIS — R0689 Other abnormalities of breathing: Secondary | ICD-10-CM

## 2012-10-09 DIAGNOSIS — R1310 Dysphagia, unspecified: Secondary | ICD-10-CM | POA: Diagnosis present

## 2012-10-09 DIAGNOSIS — Z7901 Long term (current) use of anticoagulants: Secondary | ICD-10-CM

## 2012-10-09 DIAGNOSIS — R079 Chest pain, unspecified: Secondary | ICD-10-CM | POA: Diagnosis present

## 2012-10-09 DIAGNOSIS — I82403 Acute embolism and thrombosis of unspecified deep veins of lower extremity, bilateral: Secondary | ICD-10-CM | POA: Diagnosis present

## 2012-10-09 DIAGNOSIS — R0789 Other chest pain: Principal | ICD-10-CM | POA: Insufficient documentation

## 2012-10-09 DIAGNOSIS — I10 Essential (primary) hypertension: Secondary | ICD-10-CM | POA: Diagnosis present

## 2012-10-09 HISTORY — DX: Type 2 diabetes mellitus without complications: E11.9

## 2012-10-09 HISTORY — DX: Malignant neoplasm of colon, unspecified: C18.9

## 2012-10-09 HISTORY — DX: Ventral hernia without obstruction or gangrene: K43.9

## 2012-10-09 HISTORY — DX: Hypothyroidism, unspecified: E03.9

## 2012-10-09 HISTORY — DX: Cardiomegaly: I51.7

## 2012-10-09 LAB — BASIC METABOLIC PANEL
Calcium: 9.8 mg/dL (ref 8.4–10.5)
Potassium: 3.8 mEq/L (ref 3.5–5.1)

## 2012-10-09 LAB — CBC
HCT: 35 % — ABNORMAL LOW (ref 39.0–52.0)
Hemoglobin: 11.8 g/dL — ABNORMAL LOW (ref 13.0–17.0)
MCHC: 33.7 g/dL (ref 30.0–36.0)
MCV: 94.3 fL (ref 78.0–100.0)
RDW: 13.4 % (ref 11.5–15.5)

## 2012-10-09 LAB — TROPONIN I: Troponin I: <0.3 ng/mL (ref <0.30)

## 2012-10-09 MED ORDER — ALBUTEROL SULFATE (5 MG/ML) 0.5% IN NEBU: 2.5000 mg | INHALATION_SOLUTION | RESPIRATORY_TRACT | Status: DC | PRN

## 2012-10-09 MED ORDER — ONDANSETRON HCL 4 MG PO TABS: 4.0000 mg | ORAL_TABLET | Freq: Four times a day (QID) | ORAL | Status: DC | PRN

## 2012-10-09 MED ORDER — LEVOTHYROXINE SODIUM 50 MCG PO TABS
50.0000 ug | ORAL_TABLET | Freq: Every day | ORAL | Status: DC
  Administered 2012-10-10 – 2012-10-11 (×2): 50 ug via ORAL
  Filled 2012-10-09 (×2): qty 1

## 2012-10-09 MED ORDER — ASPIRIN 325 MG PO TABS
325.0000 mg | ORAL_TABLET | Freq: Once | ORAL | Status: DC
  Filled 2012-10-09: qty 1

## 2012-10-09 MED ORDER — ACETAMINOPHEN 325 MG PO TABS: 650.0000 mg | ORAL_TABLET | Freq: Four times a day (QID) | ORAL | Status: DC | PRN

## 2012-10-09 MED ORDER — LISINOPRIL-HYDROCHLOROTHIAZIDE 10-12.5 MG PO TABS: 1.0000 | ORAL_TABLET | Freq: Every day | ORAL | Status: DC

## 2012-10-09 MED ORDER — HYDROCHLOROTHIAZIDE 12.5 MG PO CAPS
12.5000 mg | ORAL_CAPSULE | Freq: Every day | ORAL | Status: DC
  Administered 2012-10-10: 12.5 mg via ORAL
  Filled 2012-10-09: qty 1

## 2012-10-09 MED ORDER — POTASSIUM CHLORIDE IN NACL 20-0.9 MEQ/L-% IV SOLN
INTRAVENOUS | Status: DC
  Administered 2012-10-09: 1 mL via INTRAVENOUS

## 2012-10-09 MED ORDER — HYDROMORPHONE HCL PF 1 MG/ML IJ SOLN: 0.2500 mg | INTRAMUSCULAR | Status: DC | PRN

## 2012-10-09 MED ORDER — ASPIRIN 81 MG PO CHEW
324.0000 mg | CHEWABLE_TABLET | Freq: Once | ORAL | Status: AC
  Administered 2012-10-09: 324 mg via ORAL
  Filled 2012-10-09: qty 4

## 2012-10-09 MED ORDER — ALUM & MAG HYDROXIDE-SIMETH 200-200-20 MG/5ML PO SUSP: 30.0000 mL | Freq: Four times a day (QID) | ORAL | Status: DC | PRN

## 2012-10-09 MED ORDER — ACETAMINOPHEN 650 MG RE SUPP: 650.0000 mg | Freq: Four times a day (QID) | RECTAL | Status: DC | PRN

## 2012-10-09 MED ORDER — FAMOTIDINE 20 MG PO TABS
20.0000 mg | ORAL_TABLET | Freq: Two times a day (BID) | ORAL | Status: DC
  Administered 2012-10-09 – 2012-10-10 (×2): 20 mg via ORAL
  Filled 2012-10-09 (×2): qty 1

## 2012-10-09 MED ORDER — SIMVASTATIN 20 MG PO TABS
20.0000 mg | ORAL_TABLET | Freq: Every day | ORAL | Status: DC
  Administered 2012-10-09 – 2012-10-10 (×2): 20 mg via ORAL
  Filled 2012-10-09 (×2): qty 1

## 2012-10-09 MED ORDER — TERAZOSIN HCL 1 MG PO CAPS
1.0000 mg | ORAL_CAPSULE | Freq: Every day | ORAL | Status: DC
  Administered 2012-10-09 – 2012-10-10 (×2): 1 mg via ORAL
  Filled 2012-10-09 (×2): qty 1

## 2012-10-09 MED ORDER — NITROGLYCERIN 0.4 MG SL SUBL: 0.4000 mg | SUBLINGUAL_TABLET | SUBLINGUAL | Status: DC | PRN

## 2012-10-09 MED ORDER — INSULIN ASPART 100 UNIT/ML ~~LOC~~ SOLN: 0.0000 [IU] | Freq: Three times a day (TID) | SUBCUTANEOUS | Status: DC

## 2012-10-09 MED ORDER — ENOXAPARIN SODIUM 80 MG/0.8ML ~~LOC~~ SOLN
80.0000 mg | SUBCUTANEOUS | Status: DC
  Administered 2012-10-10: 80 mg via SUBCUTANEOUS
  Filled 2012-10-09: qty 0.8

## 2012-10-09 MED ORDER — INSULIN ASPART 100 UNIT/ML ~~LOC~~ SOLN: 0.0000 [IU] | Freq: Every day | SUBCUTANEOUS | Status: DC

## 2012-10-09 MED ORDER — SENNA 8.6 MG PO TABS
1.0000 | ORAL_TABLET | Freq: Every day | ORAL | Status: DC
  Administered 2012-10-09 – 2012-10-10 (×2): 8.6 mg via ORAL
  Filled 2012-10-09 (×2): qty 1

## 2012-10-09 MED ORDER — SODIUM CHLORIDE 0.9 % IJ SOLN
3.0000 mL | Freq: Two times a day (BID) | INTRAMUSCULAR | Status: DC
  Administered 2012-10-10 – 2012-10-11 (×2): 3 mL via INTRAVENOUS

## 2012-10-09 MED ORDER — LISINOPRIL 10 MG PO TABS
10.0000 mg | ORAL_TABLET | Freq: Every day | ORAL | Status: DC
  Administered 2012-10-10: 10 mg via ORAL
  Filled 2012-10-09: qty 1

## 2012-10-09 MED ORDER — ONDANSETRON HCL 4 MG/2ML IJ SOLN: 4.0000 mg | Freq: Four times a day (QID) | INTRAMUSCULAR | Status: DC | PRN

## 2012-10-09 MED ORDER — OXYCODONE HCL 5 MG PO TABS: 5.0000 mg | ORAL_TABLET | ORAL | Status: DC | PRN

## 2012-10-09 NOTE — H&P (Signed)
[facility] History and Physical  Bryce Mcdonald NFA:[id number] DOB: 07/09/1921 DOA: 10/09/2012  Referring physician: ED physician Dr. Gwendolyn Grant PCP: [facility] in [place]  Specialists:   Chief Complaint:   HPI: Bryce Mcdonald is a 79 y.o. male with a history significant for atrial fibrillation, diagnosed during the previous hospitalization 2 weeks ago, remote coronary artery disease, and PE/DVT on chronic Lovenox therapy, who returns to the emergency department today with a chief complaint of chest pain. The patient has had intermittent chest pain for the past week. It is intermittent. It occurs at rest and with activity. It radiates to his mid epigastric region. He describes the pain as "a blunt pain". There is associated shortness of breath, but no pleurisy. He has had an intermittent cough with occasional white sputum. He has occasional lightheadedness. He has occasional difficulty swallowing solid foods and indigestion. He denies associated nausea, diaphoresis, or radiation of the pain. He has swelling in his feet but not necessarily in his legs. He denies bilateral calf pain. He says that he is taking his Lovenox injections daily without missing any doses.  [facility] When he complained of chest pain, his primary care provider advised him to go to the nearest emergency department.  In the emergency department, the patient is afebrile and borderline bradycardic. His blood pressure is within normal limits. His EKG reveals atrial fibrillation, right bundle branch block, and a heart rate of 78 beats per minute. His chest x-ray reveals no acute cardiopulmonary disease. His lab data are significant for a WBC of 3.5, hemoglobin of 11.8, normal troponin I., and glucose of 142. He is being admitted for further evaluation and management.     Review of Systems: As above in history present illness. In addition, he has occasional  abdominal pain associated with his ventral hernia. Otherwise review of systems is negative.  Past Medical History  Diagnosis Date  . Colon cancer     Status post partial colectomy  . Hypertension   . Hypercholesterolemia   . Blood transfusion   . Coronary artery disease   . DVT (deep venous thrombosis)   . PE (pulmonary embolism)   . Diabetes mellitus without complication   . A-fib   . Diabetes mellitus, type II   . Ventral hernia   . Right bundle branch block   . Unspecified hypothyroidism    Past Surgical History  Procedure Laterality Date  . Abdominal surgery      x 3  . Cardiac catheterization  2008   Social History: He is widowed. He lives alone in [place]. He has 8 children. he is retired. He stopped smoking and drinking alcohol over 40 years ago. He still drives.    Allergies  Allergen Reactions  . Codeine Shortness Of Breath    Shortness of breath    Family history: The patient's parents are deceased. Etiology unclear.   Prior to Admission medications   Medication Sig Start Date End Date Taking? Authorizing Provider  enoxaparin (LOVENOX) 80 MG/0.8ML SOLN Inject 80 mg into the skin daily.     Yes Historical Provider, MD  levothyroxine (SYNTHROID, LEVOTHROID) 50 MCG tablet Take 50 mcg by mouth daily before breakfast.   Yes Historical Provider, MD  lisinopril-hydrochlorothiazide (PRINZIDE,ZESTORETIC) 10-12.5 MG per tablet Take 1 tablet by mouth daily.   Yes Historical Provider, MD  simvastatin (ZOCOR) 40 MG tablet Take 20 mg by mouth at bedtime.     Yes Historical Provider, MD  terazosin (HYTRIN) 1 MG capsule Take 1 mg by mouth at bedtime.   Yes Historical Provider, MD   Physical Exam: Filed Vitals:   10/09/12 1842  BP: 146/75  Pulse: 60  Temp: 97.5 F (36.4 C)  Resp:      General:  Pleasant alert, somewhat hard of hearing 79 year old African-American man laying in bed, in no acute distress.  Eyes: Pupils equal, round, and reactive to light.  Extraocular movements are intact. Conjunctivae are clear. Sclera white.  ENT: Oropharynx reveals mildly dry mucous membranes. Multiple missing teeth. Poor dentition. Nasal mucosa is dry no drainage.  Neck: Supple, no adenopathy, no thyromegaly, no JVD.   Cardiovascular: Irregular, irregular, with borderline bradycardia.   Respiratory: Clear to auscultation bilaterally.   Abdomen: Well-healed vertical scar. Large ventral hernia, soft, minimal tenderness, no distention. No rigidity, no hepatosplenomegaly.  Skin: Fair turgor. No rashes.   Musculoskeletal: No acute hot red joints. Extremities with trace of pedal edema. No calf tenderness. No palpable cords. No erythema.   Psychiatric: Pleasant affect. Alert and oriented x2. Speech is clear.   Neurologic: Hard of hearing, but otherwise cranial nerves II through XII are intact. Strength is globally 5 over 5. Sensation is grossly intact.   Labs on Admission:  Basic Metabolic Panel:  Recent Labs Lab 10/09/12 1515  NA 137  K 3.8  CL 101  CO2 25  GLUCOSE 142*  BUN 23  CREATININE 1.13  CALCIUM 9.8   Liver Function Tests: No results found for this basename: AST, ALT, ALKPHOS, BILITOT, PROT, ALBUMIN,  in the last 168 hours No results found for this basename: LIPASE, AMYLASE,  in the last 168 hours No results found for this basename: AMMONIA,  in the last 168 hours CBC:  Recent Labs Lab 10/09/12 1515  WBC 3.5*  HGB 11.8*  HCT 35.0*  MCV 94.3  PLT 173   Cardiac Enzymes:  Recent Labs Lab 10/09/12 1515  TROPONINI <0.30    BNP (last 3 results)  Recent Labs  09/29/12 2348  PROBNP 2067.0*   CBG:  Recent Labs Lab 10/09/12 1756  GLUCAP 92    Radiological Exams on Admission: Dg Chest 2 View  10/09/2012   *RADIOLOGY REPORT*  Clinical Data: Chest pain  CHEST - 2 VIEW  Comparison: November 29, 2012.  Findings: Cardiomediastinal silhouette appears normal.  No pneumothorax is noted.  No significant pleural effusions are  noted. No acute pulmonary disease is noted.  Bilateral basilar scarring is again noted and unchanged.  IMPRESSION: No acute cardiopulmonary abnormality seen.   Original Report Authenticated By: Lupita Raider.,  M.D.    EKG: virtually unchanged from the previous EKG atrial fibrillation with right bundle branch block.   Assessment/Plan Principal Problem:   Chest pain Active Problems:   Atrial fibrillation with slow ventricular response   Abdominal pain, epigastric   Dysphagia   History of pulmonary embolism   Chronic anticoagulation   Leukopenia   Type 2 diabetes mellitus   Right bundle branch block   Essential hypertension, benign   Unspecified hypothyroidism   Anemia   1. Recurrent chest pain. The patient's chest pain is somewhat atypical. His EKG reveals atrial fibrillation with right branch block which has been virtually stable compared to the previous hospitalization. His initial troponin I is within normal limits. His history indicates coronary artery disease, but he cannot give me the details of a cardiac catheterization that was performed years ago. Currently he is chest pain-free and hemodynamically stable. 2. History of  pulmonary embolism/DVT. He is on Lovenox chronically. The VTE is certainly within the differential diagnoses of chest pain, but less likely on anti-coagulation. 3. Recently diagnosed atrial fibrillation. He has a relative bradycardia. He is anticoagulated with Lovenox. No rate limiting medication needed apparently. 4. Dysphagia with solid foods query GERD (mild and may be associated with nearly edentulous state). This may be a possible etiology of his recurrent chest pain. 5. Mild epigastric pain with evidence of ventral hernia. He does not have an acute abdomen and his liver transaminases are within normal limits. GERD is considered. We'll rule out acute pancreatitis. 6. Mild leukopenia and anemia. We'll evaluate with a few anemia studies. 7. Hypertension. Stable  on lisinopril/HCTZ. 8. Diet-controlled diabetes mellitus. We'll check a hemoglobin A1c and treat with sliding scale NovoLog during the hospitalization. 9. Hypothyroidism. His TSH was within normal limits during the previous hospitalization 2 weeks ago. Continue Synthroid.   Plan: 1. Will treat his pain with as needed opiate analgesics. We'll give a trial of when necessary sublingual nitroglycerin. 2. Will add Pepcid empirically twice a day. 3. Gentle IV fluids. 4. Sliding scale NovoLog. 5. For further evaluation, we'll order lipase, cardiac enzymes, 2-D echocardiogram, and bilateral lower extremity venous ultrasound. We'll order hemoglobin A1c, ferritin, iron, and vitamin B12. 6. Consult cardiology for further evaluation and recommendations. Query need for cardiac stress test. 7. Consult gastroenterology to assist with ruling in or out a GI cause of his recurrent chest pain.  Code Status: Full code  Family Communication: Discussed with his daughter and son. Disposition Plan: Anticipate discharge to home in the next 24-48 hours.  Time spent: One hour and 10 minutes.  [facility] Pager [phone]  If 7PM-7AM, please contact night-coverage [email or web address] Password [facility] 10/09/2012, 7:33 PM

## 2012-10-09 NOTE — ED Provider Notes (Signed)
CSN: [id number]     Arrival date & time 10/09/12  1429 History  This chart was scribed for Dagmar Hait, MD by Danella Maiers, ED Scribe. This patient was seen in room APA05/APA05 and the patient's care was started at 3:22 PM.    Chief Complaint  Patient presents with  . Chest Pain    Patient is a 79 y.o. male presenting with chest pain. The history is provided by the patient. No language interpreter was used.  Chest Pain Pain quality: pressure and sharp   Duration:  3 weeks Timing:  Intermittent Relieved by:  Rest Associated symptoms: lower extremity edema and shortness of breath   Associated symptoms: no abdominal pain, no nausea and not vomiting    HPI Comments: Bryce Mcdonald is a 79 y.o. male who presents to the Emergency Department complaining of gradually worsening, intermittent, generalized chest pain onset three weeks ago, which is episodic and lasts minutes at a time. He states that he was seen at the [place] this morning and he states that he was sent here due to having bradycardia. He describes his pain as sharp and as pressure. He reports associated SOB and swelling in the lower extremities. Pt reports that sitting still relieves his pain, and exertion worsens his pain. Pt denies hx of heart failure and COPD. He denies abdominal pain. Pt denies smoking and drinking currently but used to smoke and drink 40 years ago.   Past Medical History  Diagnosis Date  . Cancer   . Hypertension   . Hypercholesterolemia   . Blood transfusion   . Coronary artery disease   . DVT (deep venous thrombosis)   . PE (pulmonary embolism)    Past Surgical History  Procedure Laterality Date  . Abdominal surgery      x 3  . Cardiac catheterization  2008   No family history on file. History  Substance Use Topics  . Smoking status: Never Smoker   . Smokeless tobacco: Not on file  . Alcohol Use: No    Review of Systems  Respiratory: Positive for shortness of breath.    Cardiovascular: Positive for chest pain and leg swelling.  Gastrointestinal: Negative for nausea, vomiting and abdominal pain.  All other systems reviewed and are negative.    Allergies  Codeine  Home Medications   Current Outpatient Rx  Name  Route  Sig  Dispense  Refill  . enoxaparin (LOVENOX) 80 MG/0.8ML SOLN   Subcutaneous   Inject 80 mg into the skin daily.           Marland Kitchen levothyroxine (SYNTHROID, LEVOTHROID) 50 MCG tablet   Oral   Take 50 mcg by mouth daily before breakfast.         . lisinopril-hydrochlorothiazide (PRINZIDE,ZESTORETIC) 10-12.5 MG per tablet   Oral   Take 1 tablet by mouth daily.         . simvastatin (ZOCOR) 40 MG tablet   Oral   Take 20 mg by mouth at bedtime.           Marland Kitchen terazosin (HYTRIN) 1 MG capsule   Oral   Take 1 mg by mouth at bedtime.          Triage Vitals: BP 116/93  Pulse 59  Temp(Src) 97.7 F (36.5 C) (Oral)  Resp 20  Ht 5\' 8"  (1.727 m)  Wt 179 lb (81.194 kg)  BMI 27.22 kg/m2  SpO2 95%  Physical Exam  Nursing note and vitals reviewed. Constitutional: He is oriented to  person, place, and time. He appears well-developed and well-nourished. No distress.  HENT:  Head: Normocephalic and atraumatic.  Eyes: Conjunctivae and EOM are normal.  Neck: Neck supple. No JVD present. No tracheal deviation present.  Cardiovascular: Normal rate.   Pulmonary/Chest: Effort normal. No respiratory distress.  Diffuse scattered adventitious sounds.  Musculoskeletal: Normal range of motion. He exhibits no edema.  Neurological: He is alert and oriented to person, place, and time.  Skin: Skin is warm and dry.  Psychiatric: He has a normal mood and affect. His behavior is normal.    ED Course   Medications  aspirin chewable tablet 324 mg (324 mg Oral Given 10/09/12 1559)    DIAGNOSTIC STUDIES: Oxygen Saturation is 95% on room air, normal by my interpretation.    COORDINATION OF CARE: 3:29 PM-Discussed treatment plan which includes  CXR, CBC panel, BMP, EKG with pt at bedside and pt agreed to plan.    Procedures (including critical care time)  Labs Reviewed  CBC - Abnormal; Notable for the following:    WBC 3.5 (*)    RBC 3.71 (*)    Hemoglobin 11.8 (*)    HCT 35.0 (*)    All other components within normal limits  BASIC METABOLIC PANEL - Abnormal; Notable for the following:    Glucose, Bld 142 (*)    GFR calc non Af Amer 56 (*)    GFR calc Af Amer 65 (*)    All other components within normal limits  TROPONIN I   Dg Chest 2 View  10/09/2012   *RADIOLOGY REPORT*  Clinical Data: Chest pain  CHEST - 2 VIEW  Comparison: November 29, 2012.  Findings: Cardiomediastinal silhouette appears normal.  No pneumothorax is noted.  No significant pleural effusions are noted. No acute pulmonary disease is noted.  Bilateral basilar scarring is again noted and unchanged.  IMPRESSION: No acute cardiopulmonary abnormality seen.   Original Report Authenticated By: Lupita Raider.,  M.D.   1. Chest pain     Date: 10/09/2012  Rate: 78  Rhythm: atrial fibrillation  QRS Axis: right  Intervals: normal  ST/T Wave abnormalities: normal  Conduction Disutrbances:none  Narrative Interpretation:   Old EKG Reviewed: unchanged   MDM  Patient is 79 year old male who is actually poor historian. He presents for chest pain. He states intermittent chest pain in the center radiates from his back and of his belly. The last several minutes a time. States sometimes like pressure. He seemed to be a earlier today and was told to come appear for further evaluation due to his heart rate being low. Patient had no low heart rates are ED. EKG shows A. fib and is normal. He is currently on Lovenox and states he's taken it today. Patient instructed to stop his beta blocker previously due to low heart rates, and he states he's been compliant with this. Chest pain-free here. Vitals stable here. EKG with A. fib. Patient's admitted for ACS rule out after initial  troponin negative.   I personally performed the services described in this documentation, which was scribed in my presence. The recorded information has been reviewed and is accurate.  I have reviewed all labs and imaging and considered them in my medical decision making.    Dagmar Hait, MD 10/09/12 2024

## 2012-10-09 NOTE — ED Notes (Addendum)
Pt was seen at the [place] this morning. Pt stated he had chest pain this morning. Denies pain at this time. Received call when they got back home telling them to go to the nearest ED. Family states that they believe it was due to his heart rate but they state blood work was also done. EKG today showing Afib which is not in his history (? New onset). Pt and family are poor historians.

## 2012-10-10 ENCOUNTER — Encounter (HOSPITAL_COMMUNITY): Payer: Self-pay | Admitting: Gastroenterology

## 2012-10-10 ENCOUNTER — Observation Stay (HOSPITAL_COMMUNITY): Payer: Non-veteran care

## 2012-10-10 DIAGNOSIS — I517 Cardiomegaly: Secondary | ICD-10-CM

## 2012-10-10 DIAGNOSIS — Z7901 Long term (current) use of anticoagulants: Secondary | ICD-10-CM

## 2012-10-10 DIAGNOSIS — I420 Dilated cardiomyopathy: Secondary | ICD-10-CM | POA: Diagnosis present

## 2012-10-10 DIAGNOSIS — I428 Other cardiomyopathies: Secondary | ICD-10-CM

## 2012-10-10 DIAGNOSIS — R1310 Dysphagia, unspecified: Secondary | ICD-10-CM

## 2012-10-10 DIAGNOSIS — I82409 Acute embolism and thrombosis of unspecified deep veins of unspecified lower extremity: Secondary | ICD-10-CM

## 2012-10-10 DIAGNOSIS — R0789 Other chest pain: Secondary | ICD-10-CM

## 2012-10-10 DIAGNOSIS — Z86711 Personal history of pulmonary embolism: Secondary | ICD-10-CM

## 2012-10-10 DIAGNOSIS — I4510 Unspecified right bundle-branch block: Secondary | ICD-10-CM

## 2012-10-10 DIAGNOSIS — I82403 Acute embolism and thrombosis of unspecified deep veins of lower extremity, bilateral: Secondary | ICD-10-CM | POA: Diagnosis present

## 2012-10-10 LAB — GLUCOSE, CAPILLARY
Glucose-Capillary: 99 mg/dL (ref 70–99)
Glucose-Capillary: 96 mg/dL (ref 70–99)
Glucose-Capillary: 92 mg/dL (ref 70–99)

## 2012-10-10 LAB — FERRITIN: Ferritin: 313 ng/mL (ref 22–322)

## 2012-10-10 LAB — CBC
HCT: 35.1 % — ABNORMAL LOW (ref 39.0–52.0)
MCH: 31.4 pg (ref 26.0–34.0)
MCHC: 33 g/dL (ref 30.0–36.0)
MCV: 94.9 fL (ref 78.0–100.0)
Platelets: 167 10*3/uL (ref 150–400)
RDW: 13.4 % (ref 11.5–15.5)

## 2012-10-10 LAB — BASIC METABOLIC PANEL
BUN: 19 mg/dL (ref 6–23)
CO2: 24 mEq/L (ref 19–32)
Calcium: 9.7 mg/dL (ref 8.4–10.5)
Chloride: 104 mEq/L (ref 96–112)
Creatinine, Ser: 0.99 mg/dL (ref 0.50–1.35)
Glucose, Bld: 99 mg/dL (ref 70–99)

## 2012-10-10 LAB — HEMOGLOBIN A1C: Mean Plasma Glucose: 146 mg/dL — ABNORMAL HIGH (ref <117)

## 2012-10-10 LAB — TSH: TSH: 3.19 u[IU]/mL (ref 0.350–4.500)

## 2012-10-10 LAB — IRON AND TIBC
Iron: 71 ug/dL (ref 42–135)
TIBC: 239 ug/dL (ref 215–435)

## 2012-10-10 LAB — TROPONIN I: Troponin I: <0.3 ng/mL (ref <0.30)

## 2012-10-10 LAB — VITAMIN B12: Vitamin B-12: 334 pg/mL (ref 211–911)

## 2012-10-10 MED ORDER — IOHEXOL 350 MG/ML SOLN
100.0000 mL | Freq: Once | INTRAVENOUS | Status: AC | PRN
  Administered 2012-10-10: 100 mL via INTRAVENOUS

## 2012-10-10 MED ORDER — HYDROCHLOROTHIAZIDE 25 MG PO TABS
25.0000 mg | ORAL_TABLET | Freq: Every day | ORAL | Status: DC
  Administered 2012-10-11: 25 mg via ORAL
  Filled 2012-10-10: qty 1

## 2012-10-10 MED ORDER — ISOSORBIDE MONONITRATE ER 60 MG PO TB24
30.0000 mg | ORAL_TABLET | Freq: Every day | ORAL | Status: DC
  Administered 2012-10-10 – 2012-10-11 (×2): 30 mg via ORAL
  Filled 2012-10-10 (×2): qty 1

## 2012-10-10 MED ORDER — PANTOPRAZOLE SODIUM 40 MG PO TBEC
40.0000 mg | DELAYED_RELEASE_TABLET | Freq: Two times a day (BID) | ORAL | Status: DC
  Administered 2012-10-10 – 2012-10-11 (×2): 40 mg via ORAL
  Filled 2012-10-10 (×2): qty 1

## 2012-10-10 MED ORDER — LISINOPRIL 10 MG PO TABS
10.0000 mg | ORAL_TABLET | Freq: Every day | ORAL | Status: DC
  Administered 2012-10-11: 10 mg via ORAL
  Filled 2012-10-10: qty 1

## 2012-10-10 MED ORDER — ENOXAPARIN SODIUM 80 MG/0.8ML ~~LOC~~ SOLN: 80.0000 mg | Freq: Two times a day (BID) | SUBCUTANEOUS | Status: DC

## 2012-10-10 MED ORDER — ENOXAPARIN SODIUM 120 MG/0.8ML ~~LOC~~ SOLN
120.0000 mg | SUBCUTANEOUS | Status: DC
  Administered 2012-10-11: 120 mg via SUBCUTANEOUS
  Filled 2012-10-10 (×4): qty 0.8

## 2012-10-10 MED ORDER — ALBUTEROL SULFATE (5 MG/ML) 0.5% IN NEBU: 2.5000 mg | INHALATION_SOLUTION | Freq: Four times a day (QID) | RESPIRATORY_TRACT | Status: DC | PRN

## 2012-10-10 NOTE — Progress Notes (Signed)
[facility] PROGRESS NOTE  Bryce Mcdonald ZOX:[id number] DOB: June 19, 1921 DOA: 10/09/2012 PCP: No PCP Per Patient    Code Status: Full code Family Communication: Discussed with children yesterday Disposition Plan: Anticipate discharge to home in the next 24-48 hours   Consultants:  Cardiology pending  Gastroenterology pending  Procedures:  Echocardiogram pending  Antibiotics:  None  Subjective:  The patient is sitting up in the chair. He denies chest pain. He had some bilateral leg pain during the ultrasound testing, but not now.  Objective: Filed Vitals:   10/10/12 0935  BP: 140/56  Pulse:   Temp:   Resp:    Heart rate 58. Blood pressure 140/66. Respiratory rate 20. Oxygen saturation 98% on room air.   Intake/Output Summary (Last 24 hours) at 10/10/12 1156 Last data filed at 10/10/12 0800  Gross per 24 hour  Intake    240 ml  Output    800 ml  Net   -560 ml   Filed Weights   10/09/12 1439 10/09/12 1821  Weight: 81.194 kg (179 lb) 80 kg (176 lb 5.9 oz)    Exam:   General:   Pleasant alert elderly African-American man sitting up in a chair, in no acute distress.  Cardiovascular: Irregular, irregular.  Respiratory: Clear to auscultation bilaterally.  Abdomen: Mildly obese, ventral hernia soft and reducible, minimal epigastric tenderness, no rebound, no distention.  Musculoskeletal/extremities: Multiple varicosities of the lower legs. Minimal tenderness in the calf areas bilaterally. Trace of pedal edema. Pedal pulses barely palpable.   Data Reviewed: Basic Metabolic Panel:  Recent Labs Lab 10/09/12 1515  NA 137  K 3.8  CL 101  CO2 25  GLUCOSE 142*  BUN 23  CREATININE 1.13  CALCIUM 9.8   Liver Function Tests: No results found for this basename: AST, ALT, ALKPHOS, BILITOT, PROT, ALBUMIN,  in the last 168 hours  Recent Labs Lab 10/09/12 2038  LIPASE 47   No results found for this basename: AMMONIA,  in the last 168  hours CBC:  Recent Labs Lab 10/09/12 1515 10/10/12 0224  WBC 3.5* 3.3*  HGB 11.8* 11.6*  HCT 35.0* 35.1*  MCV 94.3 94.9  PLT 173 167   Cardiac Enzymes:  Recent Labs Lab 10/09/12 1515 10/09/12 2038 10/10/12 0223 10/10/12 0856  TROPONINI <0.30 <0.30 <0.30 <0.30   BNP (last 3 results)  Recent Labs  09/29/12 2348  PROBNP 2067.0*   CBG:  Recent Labs Lab 10/09/12 1756 10/09/12 2152 10/10/12 0729 10/10/12 1117  GLUCAP 92 99 99 96    No results found for this or any previous visit (from the past 240 hour(s)).   Studies: Dg Chest 2 View  10/09/2012   *RADIOLOGY REPORT*  Clinical Data: Chest pain  CHEST - 2 VIEW  Comparison: November 29, 2012.  Findings: Cardiomediastinal silhouette appears normal.  No pneumothorax is noted.  No significant pleural effusions are noted. No acute pulmonary disease is noted.  Bilateral basilar scarring is again noted and unchanged.  IMPRESSION: No acute cardiopulmonary abnormality seen.   Original Report Authenticated By: Lupita Raider.,  M.D.   US Venous Img Lower Bilateral  10/10/2012   *RADIOLOGY REPORT*  Clinical Data:  Previous right lower extremity DVT, history of pulmonary embolism, bilateral lower extremity pain and edema  VENOUS DUPLEX ULTRASOUND OF BILATERAL LOWER EXTREMITIES  Technique:  Gray-scale sonography with graded compression, as well as color Doppler and duplex ultrasound, were performed to evaluate the deep venous system of both lower extremities from the level of the  common femoral vein through the popliteal and proximal calf veins.  Spectral Doppler was utilized to evaluate flow at rest and with distal augmentation maneuvers.  Comparison:  01/20/2006  Findings: On the right,  there is no evidence of superficial thrombosis.  There is thrombus within the lumen and of the common femoral vein extending down to the level of the junction with the greater saphenous vein.  This portion of the venous system is not compressible.   Thrombus is not of occlusive.  Thrombus does not extend into the saphenous vein.  The remainder of the deep venous system on the right shows normal compressibility with no evidence of thrombus, with normal augmentation and phasicity.  On the left side, there is thrombosis in the mid to distal superficial femoral vein.  There is also thrombus at the birfurcation of the common femoral vein into the superficial and deep femoral veins as well as at the saphenofemoral junction. Thrombus is diffusely non-occlusive, with visual evdience of suggest central recanalization.  Where throbmus is visualized, the vein is not compressible.  There is phasic flow diffusely throughout the deep venous system, and there is augmentation in the proximal common femoral vein.  There is no superificial thrombophlebitis.  IMPRESSION: 1.  DVT on the right, in the area of previous DVT.  The appearance suggests subacute  DVT with some recanalization.  2.  DVT on the left, also with subacute appearance with some recanalization throughout.   Original Report Authenticated By: Esperanza Heir, M.D.    Scheduled Meds: . enoxaparin  80 mg Subcutaneous Q24H  . famotidine  20 mg Oral BID  . lisinopril  10 mg Oral Daily   And  . hydrochlorothiazide  12.5 mg Oral Daily  . insulin aspart  0-5 Units Subcutaneous QHS  . insulin aspart  0-9 Units Subcutaneous TID WC  . levothyroxine  50 mcg Oral QAC breakfast  . senna  1 tablet Oral QHS  . simvastatin  20 mg Oral QHS  . sodium chloride  3 mL Intravenous Q12H  . terazosin  1 mg Oral QHS   Continuous Infusions: . 0.9 % NaCl with KCl 20 mEq / L 1 mL (10/09/12 2019)    Assessment:  Principal Problem:   Chest pain Active Problems:   Atrial fibrillation with slow ventricular response   Abdominal pain, epigastric   Dysphagia   History of pulmonary embolism   Chronic anticoagulation   Leukopenia   Type 2 diabetes mellitus   Right bundle branch block   Essential hypertension, benign    Unspecified hypothyroidism   Anemia   DVT, bilateral lower limbs   1. Atypical chest pain. Cardiac enzymes are within normal limits. 2-D echocardiogram pending. In light of subacute bilateral lower extremity DVTs, we'll order a CT angiogram of the chest. Continue supportive treatment.  Chronic atrial fibrillation, on Lovenox (which was prescribed for VTE treatment).  Hypertension. Stable on antihypertensive medications.  History of lower extremity DVTs with evidence of subacute DVTs. We'll ask the pharmacy team to dose Lovenox more appropriately.  History of PE. As above, the patient has been on Lovenox for at least a couple of years. He self injects at home. Apparently, this was prescribed by his physicians at the [place].  Query mild dysphagia with solid foods. H2 blockade ordered with Pepcid. GI consulted, pending. Dysphagia 2 diet ordered empirically. We'll consider speech therapy consultation, but he has no sign of aspiration or aspiration pneumonia.  Mild abdominal tenderness. He has a ventral hernia which  may be contributing to some of his discomfort, but there are no signs or symptoms of strangulation or obstruction. Patient's lipase and liver transaminases are within normal limits. Treated conservatively.  Type 2 diabetes mellitus. Diet controlled. We'll continue sliding scale NovoLog. Hemoglobin A1c pending.  Hypothyroidism. TSH pending. Continue Synthroid.  Anemia and leukopenia. TSH, vitamin B12, and total iron labs pending.     Plan: 1. Appropriate dosing of Lovenox. Will ask pharmacy to dose. 2. CT angiogram of the chest to rule out recurrent acute/subacute PE. 3. GI consult and cardiology consult pending. 4. Check the results of labs currently pending.     Time spent: 30 minutes    [facility]  [facility] Pager [phone]. If 7PM-7AM, please contact night-coverage at [email or web address], password [email or web address] 10/10/2012, 11:56 AM  LOS: 1 day

## 2012-10-10 NOTE — Consult Note (Signed)
Referring Provider: Elliot Cousin, MD Primary Care Physician:  No PCP Per Patient Primary Gastroenterologist:  Roetta Sessions, MD  Reason for Consultation:  Dysphagia, atypical chest pain  HPI: Bryce Mcdonald is a 79 y.o. male with PMH significant for Afib (diagnosed 2 weeks ago), remote CAD, PE/DVT on chronic Lovenox therapy, who returned to ER with c/o chest pain. Chest pain has been intermittent both at rest and with exertion. C/o associated epigastric pain as well. C/O associated SOB but no diaphoresis. He has also had occasional solid food dysphagia and heartburn. No n/v. He has had intermittent cough with white sputum. Has 4-5 bowel movements daily for the past 3 months. Does have solid stool a time. Denies melena or rectal bleeding. Remote EGD and colonoscopy at the [place] in [facility] per patient. Colon cancer years ago status post resection. C/O lower extremity edema primarily in his feet. Very difficult historian.   Troponin X 1 negative. 2-D ECHO and cardiology consult pending.    Bilateral Doppler studies done yesterday showed DVT on the right, in the area of previous DVT. Appearance suggests subacute. DVT on the left also with subacute appearance.  CTA of the chest today was negative for pulmonary embolus. Stable bilateral pulmonary nodules felt to be benign.  Prior to Admission medications   Medication Sig Start Date End Date Taking? Authorizing Provider  enoxaparin (LOVENOX) 80 MG/0.8ML SOLN Inject 80 mg into the skin daily.     Yes Historical Provider, MD  levothyroxine (SYNTHROID, LEVOTHROID) 50 MCG tablet Take 50 mcg by mouth daily before breakfast.   Yes Historical Provider, MD  lisinopril-hydrochlorothiazide (PRINZIDE,ZESTORETIC) 10-12.5 MG per tablet Take 1 tablet by mouth daily.   Yes Historical Provider, MD  simvastatin (ZOCOR) 40 MG tablet Take 20 mg by mouth at bedtime.     Yes Historical Provider, MD  terazosin (HYTRIN) 1 MG capsule Take 1 mg by mouth at bedtime.   Yes  Historical Provider, MD    Current Facility-Administered Medications  Medication Dose Route Frequency Provider Last Rate Last Dose  . 0.9 % NaCl with KCl 20 mEq/ L  infusion   Intravenous Continuous Elliot Cousin, MD 50 mL/hr at 10/09/12 2019 1 mL at 10/09/12 2019  . acetaminophen (TYLENOL) tablet 650 mg  650 mg Oral Q6H PRN Elliot Cousin, MD       Or  . acetaminophen (TYLENOL) suppository 650 mg  650 mg Rectal Q6H PRN Elliot Cousin, MD      . albuterol (PROVENTIL) (5 MG/ML) 0.5% nebulizer solution 2.5 mg  2.5 mg Nebulization Q6H PRN Adolm Joseph, RRT      . alum & mag hydroxide-simeth (MAALOX/MYLANTA) 200-200-20 MG/5ML suspension 30 mL  30 mL Oral Q6H PRN Elliot Cousin, MD      . enoxaparin (LOVENOX) injection 80 mg  80 mg Subcutaneous Q24H Elliot Cousin, MD   80 mg at 10/10/12 0935  . famotidine (PEPCID) tablet 20 mg  20 mg Oral BID Elliot Cousin, MD   20 mg at 10/10/12 0935  . lisinopril (PRINIVIL,ZESTRIL) tablet 10 mg  10 mg Oral Daily Elliot Cousin, MD   10 mg at 10/10/12 1610   And  . hydrochlorothiazide (MICROZIDE) capsule 12.5 mg  12.5 mg Oral Daily Elliot Cousin, MD   12.5 mg at 10/10/12 0936  . HYDROmorphone (DILAUDID) injection 0.25 mg  0.25 mg Intravenous Q4H PRN Elliot Cousin, MD      . insulin aspart (novoLOG) injection 0-5 Units  0-5 Units Subcutaneous QHS Elliot Cousin, MD      .  insulin aspart (novoLOG) injection 0-9 Units  0-9 Units Subcutaneous TID WC Elliot Cousin, MD      . levothyroxine (SYNTHROID, LEVOTHROID) tablet 50 mcg  50 mcg Oral QAC breakfast Elliot Cousin, MD   50 mcg at 10/10/12 0936  . nitroGLYCERIN (NITROSTAT) SL tablet 0.4 mg  0.4 mg Sublingual Q5 min PRN Elliot Cousin, MD      . ondansetron [facility]) tablet 4 mg  4 mg Oral Q6H PRN Elliot Cousin, MD       Or  . ondansetron (ZOFRAN) injection 4 mg  4 mg Intravenous Q6H PRN Elliot Cousin, MD      . oxyCODONE (Oxy IR/ROXICODONE) immediate release tablet 5 mg  5 mg Oral Q4H PRN Elliot Cousin, MD       . senna (SENOKOT) tablet 8.6 mg  1 tablet Oral QHS Elliot Cousin, MD   8.6 mg at 10/09/12 2207  . simvastatin (ZOCOR) tablet 20 mg  20 mg Oral QHS Elliot Cousin, MD   20 mg at 10/09/12 2207  . sodium chloride 0.9 % injection 3 mL  3 mL Intravenous Q12H Elliot Cousin, MD      . terazosin (HYTRIN) capsule 1 mg  1 mg Oral QHS Elliot Cousin, MD   1 mg at 10/09/12 2207    Allergies as of 10/09/2012 - Review Complete 10/09/2012  Allergen Reaction Noted  . Codeine Shortness Of Breath 09/30/2012    Past Medical History  Diagnosis Date  . Colon cancer     Status post partial colectomy  . Hypertension   . Hypercholesterolemia   . Blood transfusion   . Coronary artery disease   . DVT (deep venous thrombosis)   . PE (pulmonary embolism)   . Diabetes mellitus without complication   . A-fib   . Diabetes mellitus, type II   . Ventral hernia   . Right bundle branch block   . Unspecified hypothyroidism     Past Surgical History  Procedure Laterality Date  . Abdominal surgery      x 3  . Cardiac catheterization  2008    History reviewed. No pertinent family history.  History   Social History  . Marital Status: Widowed    Spouse Name: N/A    Number of Children: 8  . Years of Education: N/A   Occupational History  . Not on file.   Social History Main Topics  . Smoking status: Former Games developer  . Smokeless tobacco: Not on file     Comment: quit over 40 years ago  . Alcohol Use: No     Comment: quit over forty years ago  . Drug Use: No  . Sexual Activity: No   Other Topics Concern  . Not on file   Social History Narrative  . No narrative on file     ROS:  General: Negative for anorexia, weight loss, fever, chills, fatigue, weakness. Eyes: Negative for vision changes.  ENT: Negative for hoarseness,  nasal congestion. CV: see hpi. Negative for palpitations.  Respiratory: see hpi.  GI: See history of present illness. GU:  Negative for dysuria, hematuria, urinary  incontinence, urinary frequency, nocturnal urination.  MS: Negative for joint pain, low back pain.  Derm: Negative for rash or itching.  Neuro: Negative for weakness, abnormal sensation, seizure, frequent headaches, memory loss, confusion.  Psych: Negative for anxiety, depression, suicidal ideation, hallucinations.  Endo: Negative for unusual weight change.  Heme: Negative for bruising or bleeding. Allergy: Negative for rash or hives.  Physical Examination: Vital signs in last 24 hours: Temp:  [97.5 F (36.4 C)-98.1 F (36.7 C)] 97.6 F (36.4 C) (08/21 1327) Pulse Rate:  [58-85] 85 (08/21 1327) Resp:  [18-24] 20 (08/21 1327) BP: (106-196)/(53-93) 196/60 mmHg (08/21 1327) SpO2:  [92 %-100 %] 92 % (08/21 1327) Weight:  [176 lb 5.9 oz (80 kg)-179 lb (81.194 kg)] 176 lb 5.9 oz (80 kg) (08/20 1821) Last BM Date: 10/10/12  General: Well-nourished, well-developed in no acute distress.  Head: Normocephalic, atraumatic.   Eyes: Conjunctiva pink, no icterus. Mouth: Oropharyngeal mucosa moist and pink , no lesions erythema or exudate. Neck: Supple without thyromegaly, masses, or lymphadenopathy.  Lungs: Clear to auscultation bilaterally.  Heart: Regular rate and rhythm, no murmurs rubs or gallops.  Abdomen: Bowel sounds are normal, nontender, nondistended, no hepatosplenomegaly or masses, no abdominal bruits or    hernia , no rebound or guarding.  Large ventral hernia, easily reducible, nontender Rectal: not performed Extremities: No lower extremity edema, clubbing, deformity. Multiple varicosities bilaterally.  Neuro: Alert and oriented x 4 , grossly normal neurologically.  Skin: Warm and dry, no rash or jaundice.   Psych: Alert and cooperative, normal mood and affect.        Intake/Output from previous day: 08/20 0701 - 08/21 0700 In: -  Out: 400 [Urine:400] Intake/Output this shift: Total I/O In: 240 [P.O.:240] Out: 400 [Urine:400]  Lab Results: CBC  Recent Labs   10/09/12 1515 10/10/12 0224  WBC 3.5* 3.3*  HGB 11.8* 11.6*  HCT 35.0* 35.1*  MCV 94.3 94.9  PLT 173 167   BMET  Recent Labs  10/09/12 1515  NA 137  K 3.8  CL 101  CO2 25  GLUCOSE 142*  BUN 23  CREATININE 1.13  CALCIUM 9.8   LFT No results found for this basename: BILITOT, BILIDIR, IBILI, ALKPHOS, AST, ALT, PROT, ALBUMIN,  in the last 72 hours Lab Results  Component Value Date   ALT 6 09/30/2012   AST 12 09/30/2012   ALKPHOS 69 09/30/2012   BILITOT 0.3 09/30/2012   Lab Results  Component Value Date   LIPASE 47 10/09/2012    PT/INR  Recent Labs  10/09/12 2038  LABPROT 14.2  INR 1.12    Ferritin, B12, iron/TIBC pending.  Imaging Studies: Dg Chest 2 View  10/09/2012   *RADIOLOGY REPORT*  Clinical Data: Chest pain  CHEST - 2 VIEW  Comparison: November 29, 2012.  Findings: Cardiomediastinal silhouette appears normal.  No pneumothorax is noted.  No significant pleural effusions are noted. No acute pulmonary disease is noted.  Bilateral basilar scarring is again noted and unchanged.  IMPRESSION: No acute cardiopulmonary abnormality seen.   Original Report Authenticated By: Lupita Raider.,  M.D.    Impression: 79 y/o gentleman, somewhat difficult historian, who presents with complaints of intermittent chest pain with exertion and at rest. Diagnosed with Afib during recent admission. C/o occasional dysphagia to solid foods. C/o heartburn. Has chronic postprandially stools for several months (4-5 per day). Remote partial colectomy for colon cancer, patient does not recall how long its been since his last colonoscopy. This was done at the [place] in [place]. Takes chronic Lovenox for h/o DVT/PE. Dopplers yesterday show bilateral subacute DVTs. Cardiology consult/ECHO pending.  Plan: 1. Consider EGD/ED to evaluate for GI cause of atypical chest pain/dysphagia.  2. Await cardiology input. 3. Monitor stools, may need to consider C.Diff PCR if evidence of frequent loose  BMs. 4. PPI. 5. Further recommendations to follow.  LOS: 1 day   Tana Coast  10/10/2012, 1:40 PM    Attending note:  Patient seen and examined. Agree with above assessment and recommendations. Patient gives a vague 3-4 year history of nonprogressive intermittent esophageal dysphagia. Recent chest pain with exertional component. Patient will need eventual GI evaluation but this can be done electively. Agree with the cardiology consultation while here. Agree with empiric course of PPI therapy. We'll be happy to pursue GI evaluation as an outpatient in the near future or patient may go back to the [facility].

## 2012-10-10 NOTE — Consult Note (Signed)
The patient was seen and examined, and I agree with the assessment and plan as documented above. 1. Chest pain: this has currently resolved, ECG unchanged since August 2012 (RBBB and LAFB), with 3 normal troponins. Given diffuse LCx 50% disease in 2012, I would not pursue stress testing, but simply manage medically. I will add Imdur 30 mg daily. His accelerated HTN (being off of his previous beta blocker) can also be contributing to his chest pain. I will increase his HCTZ from 12.5 to 25 mg daily, and if need be, Lisinopril can be increased as well.   2. Severe non-ischemic cardiomyopathy: now off of Metoprolol due to bradycardia. He is not in heart failure and is compensated at this time.  3. Atrial fibrillation: he has conduction system disease, given the fact that his rate is controlled off of rate-slowing agents, in the setting of chronic RBBB and LAFB. He continues on LMWH.  4. Accelerated HTN: increase HCTZ to 25 mg daily, and can increase Lisinopril in the future if need be.

## 2012-10-10 NOTE — Consult Note (Signed)
CARDIOLOGY CONSULT NOTE  Patient ID: Bryce Mcdonald MRN: [id number] DOB/AGE: [birth year] 79 y.o.  Admit date: 10/09/2012 Referring Physician:PTH Primary PhysicianNo PCP Per Patient Primary Cardiologist: [facility]) Reason for Consultation: Chest Pain Principal Problem:   Chest pain Active Problems:   Atrial fibrillation with slow ventricular response   Abdominal pain, epigastric   Dysphagia   History of pulmonary embolism   Chronic anticoagulation   Leukopenia   Type 2 diabetes mellitus   Right bundle branch block   Essential hypertension, benign   Unspecified hypothyroidism   Anemia   DVT, bilateral lower limbs  HPI: Bryce Mcdonald is an 79 y/o patient formerly of [facility], with known history of CAD, most recent cardiac cath in 2011 demonstrating mild to moderate disease, treated medically, atrial fibrillation, PE/DVT on chronic lovenox therapy for two years, diabetes, hypercholesterolemia, who was admitted with recurrent chest pain and epigastric pain. He states he was seen by the [place] in [facility] yesterday and was found to have a slow HR, labs were drawn, and he [facility]. He was recently admitted to [facility] on 09/30/2012 for atrial fib with slow ventricular response and stayed 24 hours. Beta blocker was discontinued at that time.    On presentation to ER, HR was 59 bpm, with BP of 116/93. EKG demonstrated atrial fibrillation with rate of  78 bpm, RBBB. Troponin.was found to be negative X2. TSH 3.190. D-dimer was elevated, CT negative for PE, doppler ultrasound positive of bilateral DVT. He is on Lovenox SQ provided by the [place].    He is without complaint of recurrent chest pain at this time. Breathing status is stable. He is a difficult historian. He is also being seen by GI with addition of PPI and planned EDG for questionable esophageal dysphagia once cardiology has evaluated him.       Review of systems complete and found to be negative  unless listed above   Past Medical History  Diagnosis Date  . Colon cancer     Status post partial colectomy  . Hypertension   . Hypercholesterolemia   . Blood transfusion   . Coronary artery disease   . DVT (deep venous thrombosis)   . PE (pulmonary embolism)   . Diabetes mellitus without complication   . A-fib   . Diabetes mellitus, type II   . Ventral hernia   . Right bundle branch block   . Unspecified hypothyroidism     History reviewed. No pertinent family history.  History   Social History  . Marital Status: Widowed    Spouse Name: N/A    Number of Children: 8  . Years of Education: N/A   Occupational History  . Not on file.   Social History Main Topics  . Smoking status: Former Games developer  . Smokeless tobacco: Not on file     Comment: quit over 40 years ago  . Alcohol Use: No     Comment: quit over forty years ago  . Drug Use: No  . Sexual Activity: No   Other Topics Concern  . Not on file   Social History Narrative  . No narrative on file    Past Surgical History  Procedure Laterality Date  . Abdominal surgery      x 3  . Cardiac catheterization  2008     Prescriptions prior to admission  Medication Sig Dispense Refill  . enoxaparin (LOVENOX) 80 MG/0.8ML SOLN Inject 80 mg into the skin  daily.        . levothyroxine (SYNTHROID, LEVOTHROID) 50 MCG tablet Take 50 mcg by mouth daily before breakfast.      . lisinopril-hydrochlorothiazide (PRINZIDE,ZESTORETIC) 10-12.5 MG per tablet Take 1 tablet by mouth daily.      . simvastatin (ZOCOR) 40 MG tablet Take 20 mg by mouth at bedtime.        Marland Kitchen terazosin (HYTRIN) 1 MG capsule Take 1 mg by mouth at bedtime.       Cardiac Cath 07/08/2010 IMPRESSION:  1. Low normal left ventricular function with an ejection fraction of  50-55% with left ventricular hypertrophy and subtle focal apical  septal, apical inferior hypocontractility.  2. Mild to moderate coronary obstructive disease with 20% left main  ostial  tapering, 20 and 30% proximal and  mid LAD stenoses, diffuse 50% narrowing in the circumflex marginal  vessel and 20% narrowing in the right coronary artery.  RECOMMENDATIONS: Medical therapy.  Echocardiogram:02/22/2006 SUMMARY - LV EF is diminished and estimated at 20-30% with regionality. There is global HK except ant sept and apical AK. No obvious thrombus was noted but cannot exclude the possibility on this study. - Mild aortic sclerosis without stenosis. - The left atrium was mildly dilated. - There was right ventricular hypertrophy. - The inferior vena cava was mildly dilated. Respirophasic inferior vena cava changes were blunted (less than 50% variation). - small, echo-free pericardial effusion circumferential to the heart. There was mild right atrial chamber collapse. There was mild right ventricular chamber collapse. Features were consistent with mild tamponade physiology.    Physical Exam: Blood pressure 196/60, pulse 85, temperature 97.6 F (36.4 C), temperature source Oral, resp. rate 20, height 5\' 8"  (1.727 m), weight 176 lb 5.9 oz (80 kg), SpO2 92.00%.   General: Well developed, well nourished, in no acute distress Head: Eyes PERRLA, No xanthomas.   Normal cephalic and atraumatic, several missing teeth.  Lungs: Bilateral rhonchi without wheezes, occasional coughing.  Heart: HRIR S1 S2, without MRG.  Pulses are 2+ & equal.            No carotid bruit. No JVD.   Abdomen: Bowel sounds are positive, abdomen soft and non-tender without masses or                  Hernia's noted. Msk:  Back normal,  Normal strength and tone for age. Extremities: No clubbing, cyanosis or edema.  DP +1 Neuro: Alert and oriented X 3. Psych:  Good affect, responds appropriately, poor historian.  Labs:   Lab Results  Component Value Date   WBC 3.3* 10/10/2012   HGB 11.6* 10/10/2012   HCT 35.1* 10/10/2012   MCV 94.9 10/10/2012   PLT 167 10/10/2012    Recent Labs Lab 10/09/12 1515  NA 137   K 3.8  CL 101  CO2 25  BUN 23  CREATININE 1.13  CALCIUM 9.8  GLUCOSE 142*   Lab Results  Component Value Date   TROPONINI <0.30 10/10/2012       BNP (last 3 results)  Recent Labs  09/29/12 2348  PROBNP 2067.0*      Radiology: Dg Chest 2 View  10/09/2012   *RADIOLOGY REPORT*  Clinical Data: Chest pain  CHEST - 2 VIEW  Comparison: November 29, 2012.  Findings: Cardiomediastinal silhouette appears normal.  No pneumothorax is noted.  No significant pleural effusions are noted. No acute pulmonary disease is noted.  Bilateral basilar scarring is again noted and unchanged.  IMPRESSION: No acute cardiopulmonary  abnormality seen.   Original Report Authenticated By: Lupita Raider.,  M.D.   Ct Angio Chest Pe W/cm &/or Wo Cm  10/10/2012   *RADIOLOGY REPORT*  Clinical Data: Chest pain.  History of pulmonary embolism  CT ANGIOGRAPHY CHEST  .  IMPRESSION: Negative for pulmonary embolism.  Stable bilateral pulmonary nodules consistent with benign etiology.   Original Report Authenticated By: Janeece Riggers, M.D.   US Venous Img Lower Bilateral  10/10/2012   *RADIOLOGY REPORT*  Clinical Data:  Previous right lower extremity DVT, history of pulmonary embolism, bilateral lower extremity pain and edema  VENOUS DUPLEX ULTRASOUND OF BILATERAL LOWER EXTREMITIES   IMPRESSION: 1.  DVT on the right, in the area of previous DVT.  The appearance suggests subacute  DVT with some recanalization.  2.  DVT on the left, also with subacute appearance with some recanalization throughout.   Original Report Authenticated By: Esperanza Heir, M.D.   EKG: Atrial fibrillation 73 bpm. Right bundle branch block  ASSESSMENT AND PLAN:   1.Chest Pain: Typical and atypical features, occuring at rest, midsternal, with also some up the lateral chest wall as well. He has history of non-obstructive CAD per cardiac cath in 2012. Troponins are negative X 3 with no evidence of ACS per EKG. BB has been stopped in the setting of  bradycardia on last [facility] admission earlier this month. Can consider stress test as OP, but should not delay EGD evaluation.   2. Atrial fibrillation: He is not currently on AV nodal blocking agents. He remains on LMWH, which is provided to him by [place]. Rates are in the 60'-70's.   3. Hypertension: Blood pressure has been rising since admission with IV fluids. He remains on lisinopril 10 mg daily. IV fluids are now at [facility],. Will watch this, for need to increase dose of ACE. Creatinine 0.99 with potassium of 3.9.  4. Diabetes: Hgb A1C 6.7 on admission. Ongoing management by P[facility].   5. Chronic Bilateral DVT: Has been on LMWH for two years per [place]. Continues to be noted on recent doppler ultrasound completed this admission without evidence of PE.   Signed: Bettey Mare. Lyman Bishop NP [facility] 10/10/2012, 3:47 PM Co-Sign MD

## 2012-10-10 NOTE — Progress Notes (Signed)
UR Chart Review Completed  

## 2012-10-10 NOTE — Progress Notes (Signed)
ANTICOAGULATION CONSULT NOTE - Initial Consult  Pharmacy Consult for Lovenox Indication: hx DVT  Allergies  Allergen Reactions  . Codeine Shortness Of Breath    Shortness of breath    Patient Measurements: Height: 5\' 8"  (172.7 cm) Weight: 176 lb 5.9 oz (80 kg) IBW/kg (Calculated) : 68.4  Vital Signs: Temp: 97.6 F (36.4 C) (08/21 1327) Temp src: Oral (08/21 1327) BP: 196/60 mmHg (08/21 1327) Pulse Rate: 85 (08/21 1327)  Labs:  Recent Labs  10/09/12 1515 10/09/12 2038 10/10/12 0223 10/10/12 0224 10/10/12 0856  HGB 11.8*  --   --  11.6*  --   HCT 35.0*  --   --  35.1*  --   PLT 173  --   --  167  --   APTT  --  37  --   --   --   LABPROT  --  14.2  --   --   --   INR  --  1.12  --   --   --   CREATININE 1.13  --   --   --   --   TROPONINI <0.30 <0.30 <0.30  --  <0.30    Estimated Creatinine Clearance: 43.7 ml/min (by C-G formula based on Cr of 1.13).   Medical History: Past Medical History  Diagnosis Date  . Colon cancer     Status post partial colectomy  . Hypertension   . Hypercholesterolemia   . Blood transfusion   . Coronary artery disease   . DVT (deep venous thrombosis)   . PE (pulmonary embolism)   . Diabetes mellitus without complication   . A-fib   . Diabetes mellitus, type II   . Ventral hernia   . Right bundle branch block   . Unspecified hypothyroidism     Medications:  Scheduled:  . [START ON 10/11/2012] enoxaparin (LOVENOX) injection  120 mg Subcutaneous Q24H  . lisinopril  10 mg Oral Daily   And  . hydrochlorothiazide  12.5 mg Oral Daily  . insulin aspart  0-5 Units Subcutaneous QHS  . insulin aspart  0-9 Units Subcutaneous TID WC  . levothyroxine  50 mcg Oral QAC breakfast  . pantoprazole  40 mg Oral BID AC  . senna  1 tablet Oral QHS  . simvastatin  20 mg Oral QHS  . sodium chloride  3 mL Intravenous Q12H  . terazosin  1 mg Oral QHS    Assessment: 79 yo M on chronic Lovenox 80mg  sq daily for hx DVT.  He was prescribed this  treatment by a physician at [facility] whose name he cannot recall.  He states he has been giving himself these injections for ~ 2 years.   He can appropriately describe how to administer injection and syringe color.  His home dose is lower than manufacturer recommendation for his weight/renal function. Discussed dose with patient & daughter who think this dose was decreased at some point due to his blood being "too thin." Obtained level after today's dose which was low so will increase dose to manufacturer recommendation.   No bleeding noted.  Imaging was negative for new PE/DVT.   Goal of Therapy:  Heparin level 1-2 units/ml Monitor platelets by anticoagulation protocol: Yes   Plan:  Lovenox 120mg  sq daily (1.5mg /kg/day) CBC on MWF Recommend recheck 4hr anti-Xa level at new steady state is patient continues on this dose- may require discussion/ follow-up with physician at [facility].   Elson Clan 10/10/2012,2:21 PM

## 2012-10-10 NOTE — Progress Notes (Signed)
*  PRELIMINARY RESULTS* Echocardiogram 2D Echocardiogram has been performed.  Bryce Mcdonald 10/10/2012, 3:26 PM

## 2012-10-11 ENCOUNTER — Encounter (HOSPITAL_COMMUNITY): Payer: Self-pay | Admitting: Internal Medicine

## 2012-10-11 DIAGNOSIS — I10 Essential (primary) hypertension: Secondary | ICD-10-CM

## 2012-10-11 DIAGNOSIS — I4891 Unspecified atrial fibrillation: Secondary | ICD-10-CM

## 2012-10-11 DIAGNOSIS — E039 Hypothyroidism, unspecified: Secondary | ICD-10-CM

## 2012-10-11 DIAGNOSIS — E119 Type 2 diabetes mellitus without complications: Secondary | ICD-10-CM

## 2012-10-11 DIAGNOSIS — I517 Cardiomegaly: Secondary | ICD-10-CM | POA: Diagnosis present

## 2012-10-11 LAB — GLUCOSE, CAPILLARY: Glucose-Capillary: 108 mg/dL — ABNORMAL HIGH (ref 70–99)

## 2012-10-11 LAB — CBC
MCV: 95.5 fL (ref 78.0–100.0)
Platelets: 158 10*3/uL (ref 150–400)
RBC: 3.52 MIL/uL — ABNORMAL LOW (ref 4.22–5.81)
RDW: 13.3 % (ref 11.5–15.5)
WBC: 3.4 10*3/uL — ABNORMAL LOW (ref 4.0–10.5)

## 2012-10-11 MED ORDER — OMEPRAZOLE 40 MG PO CPDR: 40.0000 mg | DELAYED_RELEASE_CAPSULE | Freq: Every day | ORAL | Status: DC

## 2012-10-11 MED ORDER — ISOSORBIDE MONONITRATE ER 30 MG PO TB24: 30.0000 mg | ORAL_TABLET | Freq: Every day | ORAL | Status: DC

## 2012-10-11 MED ORDER — HYDROCHLOROTHIAZIDE 25 MG PO TABS: 25.0000 mg | ORAL_TABLET | Freq: Every day | ORAL | Status: DC

## 2012-10-11 MED ORDER — LISINOPRIL 10 MG PO TABS: 10.0000 mg | ORAL_TABLET | Freq: Every day | ORAL | Status: DC

## 2012-10-11 MED ORDER — ENOXAPARIN SODIUM 120 MG/0.8ML ~~LOC~~ SOLN: 120.0000 mg | Freq: Every day | SUBCUTANEOUS | Status: DC

## 2012-10-11 NOTE — Care Management Note (Signed)
    Page 1 of 1   10/11/2012     10:47:23 AM   CARE MANAGEMENT NOTE 10/11/2012  Patient:  Bryce Mcdonald, Bryce Mcdonald   Account Number:  [id number]  Date Initiated:  10/11/2012  Documentation initiated by:  Rosemary Holms  Subjective/Objective Assessment:   Pt is a [facility] member and has health care through the [place] services. Lives at home with his wife.     Action/Plan:   Anticipated DC Date:  10/11/2012   Anticipated DC Plan:  HOME/SELF CARE      DC Planning Services  CM consult      Choice offered to / List presented to:             Status of service:  Completed, signed off Medicare Important Message given?   (If response is "NO", the following Medicare IM given date fields will be blank) Date Medicare IM given:   Date Additional Medicare IM given:    Discharge Disposition:    Per UR Regulation:    If discussed at Long Length of Stay Meetings, dates discussed:    Comments:  10/11/12 Rosemary Holms RN bSN CM

## 2012-10-11 NOTE — Progress Notes (Signed)
SUBJECTIVE:  Principal Problem:   Chest pain Active Problems:   Atrial fibrillation with slow ventricular response   Abdominal pain, epigastric   Dysphagia   History of pulmonary embolism   Chronic anticoagulation   Leukopenia   Type 2 diabetes mellitus   Right bundle branch block   Essential hypertension, benign   Unspecified hypothyroidism   Anemia   DVT, bilateral lower limbs   Cardiomyopathy, dilated, nonischemic   LVH (left ventricular hypertrophy)   LABS: Basic Metabolic Panel:  Recent Labs  60/45/40 1515 10/10/12 0224  NA 137 139  K 3.8 3.9  CL 101 104  CO2 25 24  GLUCOSE 142* 99  BUN 23 19  CREATININE 1.13 0.99  CALCIUM 9.8 9.7   Recent Labs  10/09/12 2038  LIPASE 47   CBC:  Recent Labs  10/10/12 0224 10/11/12 0512  WBC 3.3* 3.4*  HGB 11.6* 11.2*  HCT 35.1* 33.6*  MCV 94.9 95.5  PLT 167 158   Cardiac Enzymes:  Recent Labs  10/09/12 2038 10/10/12 0223 10/10/12 0856  TROPONINI <0.30 <0.30 <0.30   Hemoglobin A1C:  Recent Labs  10/09/12 2117  HGBA1C 6.7*   Thyroid Function Tests:  Recent Labs  10/10/12 0224  TSH 3.190   Echocardiogram: 10/10/2012 Left ventricle: Technically difficult study. Wall thickness was increased in a pattern of moderate LVH. Systolic function was normal. The estimated ejection fraction was in the range of 60% to 65%. Images were inadequate for LV wall motion assessment. The study is not technically sufficient to allow evaluation of LV diastolic function, due to underlying atrial fibrillation. - Ventricular septum: Septal motion consistent with an underlying conduction disturbance. - Aortic valve: Mildly thickened leaflets. - Left atrium: The atrium was mildly to moderately dilated.   RADIOLOGY: Dg Chest 2 View  10/09/2012   *RADIOLOGY REPORT*  Clinical Data: Chest pain  CHEST - 2 VIEW  Comparison: November 29, 2012.  Findings: Cardiomediastinal silhouette appears normal.  No pneumothorax is noted.   No significant pleural effusions are noted. No acute pulmonary disease is noted.  Bilateral basilar scarring is again noted and unchanged.  IMPRESSION: No acute cardiopulmonary abnormality seen.   Original Report Authenticated By: Lupita Raider.,  M.D.   Ct Angio Chest Pe W/cm &/or Wo Cm  10/10/2012   *RADIOLOGY REPORT*  Clinical Data: Chest pain.  History of pulmonary embolism  CT ANGIOGRAPHY CHEST  Technique:  Multidetector CT imaging of the chest using the standard protocol during bolus administration of intravenous contrast. Multiplanar reconstructed images including MIPs were obtained and reviewed to evaluate the vascular anatomy.  Contrast: OMNIPAQUE IOHEXOL 350 MG/ML SOLN  Comparison: CTA chest 03/02/2012  Findings: Negative for pulmonary embolism.  Negative for aortic dissection or aneurysm.  Mild atherosclerotic disease in the aortic arch.  Heart size is normal.  Very mild pericardial effusion.  6 mm subpleural nodule right middle lobe unchanged from the CT of 02/23/2006.  There is mild scarring in the right lower lobe.  Mild scarring in the left lower lobe.  3 mm left lower lobe nodule is unchanged from prior studies.  Negative for mass or adenopathy.  No acute infiltrate or effusion.  IMPRESSION: Negative for pulmonary embolism.  Stable bilateral pulmonary nodules consistent with benign etiology.   Original Report Authenticated By: Janeece Riggers, M.D.   US Venous Img Lower Bilateral  10/10/2012   *RADIOLOGY REPORT*  Clinical Data:  Previous right lower extremity DVT, history of pulmonary embolism, bilateral lower extremity pain and edema  VENOUS DUPLEX ULTRASOUND OF BILATERAL LOWER EXTREMITIES  Technique:  Gray-scale sonography with graded compression, as well as color Doppler and duplex ultrasound, were performed to evaluate the deep venous system of both lower extremities from the level of the common femoral vein through the popliteal and proximal calf veins.  Spectral Doppler was utilized to  evaluate flow at rest and with distal augmentation maneuvers.  Comparison:  01/20/2006  Findings: On the right,  there is no evidence of superficial thrombosis.  There is thrombus within the lumen and of the common femoral vein extending down to the level of the junction with the greater saphenous vein.  This portion of the venous system is not compressible.  Thrombus is not of occlusive.  Thrombus does not extend into the saphenous vein.  The remainder of the deep venous system on the right shows normal compressibility with no evidence of thrombus, with normal augmentation and phasicity.  On the left side, there is thrombosis in the mid to distal superficial femoral vein.  There is also thrombus at the birfurcation of the common femoral vein into the superficial and deep femoral veins as well as at the saphenofemoral junction. Thrombus is diffusely non-occlusive, with visual evdience of suggest central recanalization.  Where throbmus is visualized, the vein is not compressible.  There is phasic flow diffusely throughout the deep venous system, and there is augmentation in the proximal common femoral vein.  There is no superificial thrombophlebitis.  IMPRESSION: 1.  DVT on the right, in the area of previous DVT.  The appearance suggests subacute  DVT with some recanalization.  2.  DVT on the left, also with subacute appearance with some recanalization throughout.   Original Report Authenticated By: Esperanza Heir, M.D.     PHYSICAL EXAM BP 137/79  Pulse 75  Temp(Src) 97.6 F (36.4 C) (Oral)  Resp 20  Ht 5\' 8"  (1.727 m)  Wt 177 lb 7.5 oz (80.5 kg)  BMI 26.99 kg/m2  SpO2 98% General: Well developed, well nourished, in no acute distress Head: Eyes PERRLA, No xanthomas.   Normal cephalic and atramatic  Lungs: Clear bilaterally to auscultation and percussion. Heart: HRRR S1 S2, No MRG .  Pulses are 2+ & equal.            No carotid bruit. No JVD.  No abdominal bruits. No femoral bruits. Abdomen: Bowel  sounds are positive, abdomen soft and non-tender without masses or                  Hernia's noted. Msk:  Back normal, normal gait. Normal strength and tone for age. Extremities: No clubbing, cyanosis or edema.  DP +1 Neuro: Alert and oriented X 3. Psych:  Good affect, responds appropriately  TELEMETRY: Reviewed telemetry pt in: Afib rates in the 70's  ASSESSMENT AND PLAN:  1. Atrial fibrillation: Heart rate is well controlled off AV nodal blocking agents. Continue LMWH as this is provided for him by [place]. Family wishes to have follow up with local cardiologist. Appt will be made in 2 weeks with our [place] office. There was no evidence of non-ischemic CM as described on last not. This is in error.  2. Hypertension:  Currently well controlled with increased dose of HCTZ increased and addition of imdur 30 mg daily Low salt diet is recommended.  3. Chest Pain: Resolved with use of nitrates and normalization of HR.  Bettey Mare. Lyman Bishop NP [facility] 10/11/2012, 9:36 AM

## 2012-10-11 NOTE — Progress Notes (Signed)
The patient was seen and examined, and I agree with the assessment and plan as documented above.   1. Chest pain: this has currently resolved, ECG unchanged since August 2012 (RBBB and LAFB), with 3 normal troponins. Given diffuse LCx 50% disease in 2012, I would not pursue stress testing, but simply manage medically. I have added Imdur 30 mg daily. His accelerated HTN (being off of his previous beta blocker) was likely also contributing to his chest pain. I increased his HCTZ from 12.5 to 25 mg daily, and if need be, Lisinopril can be increased as well.   2. Severe non-ischemic cardiomyopathy: now off of Metoprolol due to bradycardia. He is not in heart failure and is compensated at this time. When compared to the echo results from 2008, his LV systolic function has normalized.   3. Atrial fibrillation: he has conduction system disease, given the fact that his rate is controlled off of rate-slowing agents, in the setting of chronic RBBB and LAFB. He continues on LMWH.   4. Accelerated HTN: increased HCTZ to 25 mg daily yesterday, and can increase Lisinopril in the future if need be.   Please call with questions.

## 2012-10-11 NOTE — Discharge Summary (Signed)
Physician Discharge Summary  Avant Printy ZOX:[id number] DOB: 02/15/1922 DOA: 10/09/2012  PCP: No PCP Per Patient  Admit date: 10/09/2012 Discharge date: 10/11/2012  Time spent: Greater than 30 minutes  Recommendations for Outpatient Follow-up:  1. The patient will need to have his electrolytes, specifically, his potassium rechecked at the followup appointment.  Discharge Diagnoses:  Principal Problem:  1. Atypical chest pain. Myocardial infarction ruled out. History of nonobstructive CAD. 2. Query GERD with associated nonspecific mild dysphagia. Outpatient GI evaluation pending. 3. Mild epigastric discomfort. Nonacute. Known history of ventral hernia, stable. 3. Chronic atrial fibrillation with slow ventricular response. 4. Subacute bilateral lower extremity DVT with a history of chronic lower extremity DVTs and history of PE. No PE seen per CT angiogram of the chest., On chronic Lovenox injections. 5. Chronic right bundle branch block. 6. LVH and ejection fraction 60-65% per 2-D echocardiogram. 7. Mild normocytic anemia. Anemia panel within normal limits. 8. Mild leukopenia. TSH, vitamin B12, folate were within normal limits. 9. Type 2 diabetes mellitus. Diet controlled. Hemoglobin A1c 6.7. 10. Hypothyroidism. TSH was within normal limits at 1.59. 11. Hypertension. 12. Stable pulmonary nodules.  Discharge Condition: Improved and stable.  Diet recommendation: Carbohydrate modified/heart healthy.  Filed Weights   10/09/12 1439 10/09/12 1821 10/11/12 0439  Weight: 81.194 kg (179 lb) 80 kg (176 lb 5.9 oz) 80.5 kg (177 lb 7.5 oz)    History of present illness:  The patient is an 79 year old man with a history significant for atrial fibrillation, nonobstructive CAD per cardiac catheterization in 2011 which revealed mild to moderate disease treated medically, and history of lower extremity DVT/PE who presented to the emergency department on 10/09/2012 with a chief complaint of  atypical chest pain. In the emergency department, he was afebrile and borderline bradycardia. His blood pressure was within normal limits. His EKG revealed atrial fibrillation, right bundle branch block, and a heart rate of 78 beats per minute. His chest x-ray revealed no acute cardiopulmonary disease. His troponin I was within normal limits. He was admitted for further evaluation and management.  Hospital Course:   1. Atypical chest pain. The patient was started on supportive treatment as needed for pain. When necessary nitroglycerin was ordered, but the patient never required it. Pepcid was also added empirically for symptoms suggestive of a GI symptomatology. He was maintained on all of his other cardiac medications. For further evaluation, a number of studies were ordered. All of his cardiac enzymes were within normal limits. His 2-D echocardiogram revealed preserved LV function and moderate LVH. His TSH was within normal limits. CT angiogram of his chest revealed no pulmonary embolism. Cardiology was consulted. The cardiology team agreed with medical management, but added Imdur and increased hydrochlorothiazide to 25 mg daily. The patient was chest pain-free and hemodynamically stable at the time of discharge.   Chronic atrial fibrillation. His heart rate was within normal limits and bordering on bradycardic most of the time. Beta blockade therapy was withheld due to to relative bradycardia. He was maintained on anticoagulation with Lovenox.   History of PE and  lower extremity DVTs with evidence of subacute DVTs. Bilateral lower extremity venous ultrasound was ordered to assess for recurrent DVTs. It revealed findings suggestive of subacute DVTs bilaterally with some recanalization. I discussed Lovenox dosing with the pharmacist. Apparently, the dose was not therapeutic. Therefore, the dose was increased to 120 mg daily. He was discharged on this dose with a new prescription.   Query mild dysphagia  with solid foods. The patient  was started empirically on Pepcid twice a day. Gastroenterology was consulted. The gastroenterology team did not believe the patient needed an emergent diagnostic workup, but recommended a possible EGD in the outpatient setting electively. Pepcid was discontinued in favor of Protonix. The patient was discharged on omeprazole. He tolerated his dysphagia 2 diet with no complaints of difficulty swallowing. Of note, his lipase and liver transaminases were within normal limits.   Type 2 diabetes mellitus. Diet controlled. The patient was treated with sliding-scale NovoLog. His hemoglobin A1c was 6.7. Further management and monitoring will be deferred to his primary care physician.   Hypothyroidism. Synthroid was continued. His TSH was within normal limits.   Anemia and leukopenia. His anemia panel and TSH were within normal limits. His CBC should be monitored periodically in the outpatient setting. GI reevaluation pending in the outpatient setting.     Procedures:  2-D echocardiogram: Study Conclusions  - Left ventricle: Technically difficult study. Wall thickness was increased in a pattern of moderate LVH. Systolic function was normal. The estimated ejection fraction was in the range of 60% to 65%. Images were inadequate for LV wall motion assessment. The study is not technically sufficient to allow evaluation of LV diastolic function, due to underlying atrial fibrillation. - Ventricular septum: Septal motion consistent with an underlying conduction disturbance. - Aortic valve: Mildly thickened leaflets. - Left atrium: The atrium was mildly to moderately dilated. Transthoracic echocardiography. M-mode, complete 2D, spectral Doppler, and color Doppler. Height: Height: 172.7cm. Height: 68in. Weight: Weight: 79.8kg. Weight: 175.6lb. Body mass index: BMI: 26.8kg/m^2. Body surface area: BSA: 1.35m^2. Patient status: Inpatient. Location:  Bedside.   Consultations:  [facility] cardiology  [facility] gastroenterology  Discharge Exam: Filed Vitals:   10/11/12 0439  BP: 137/79  Pulse: 75  Temp: 97.6 F (36.4 C)  Resp: 20    General: Pleasant alert 79 year old African-American man sitting up in bed, in no acute distress. Cardiovascular: Irregular, irregular, with a soft systolic murmur. Respiratory: Clear to auscultation bilaterally. Extremities: Trace of pedal edema; multiple varicosities in the lower legs; mild calf tenderness bilaterally.  Discharge Instructions  Discharge Orders   Future Appointments Provider Department Dept Phone   10/25/2012 1:00 PM Jodelle Gross, NP [facility] [place] [phone]   11/11/2012 10:30 AM [facility] [phone]   Future Orders Complete By Expires   Diet - low sodium heart healthy  As directed    Diet Carb Modified  As directed    Discharge instructions  As directed    Comments:     The dose of the enoxaparin (Lovenox) shots was increased. Hydrochlorothiazide/lisinopril tablet (Prinzide or Zestoretic) was stopped. New doses of hydrochlorothiazide and lisinopril were prescribed. New medication, omeprazole was ordered for acid reflux. New medication for your heart, isosorbide mononitrate was prescribed.   Increase activity slowly  As directed        Medication List    STOP taking these medications       enoxaparin 80 MG/0.8ML injection  Commonly known as:  LOVENOX  Replaced by:  enoxaparin 120 MG/0.8ML injection     lisinopril-hydrochlorothiazide 10-12.5 MG per tablet  Commonly known as:  PRINZIDE,ZESTORETIC      TAKE these medications       enoxaparin 120 MG/0.8ML injection  Commonly known as:  LOVENOX  Inject 0.8 mLs (120 mg total) into the skin daily. This is a dose increase from your previous dose.     hydrochlorothiazide 25 MG tablet  Commonly known as:  HYDRODIURIL  Take 1 tablet (25 mg  total) by mouth daily. New dose of blood pressure medicine.     isosorbide mononitrate 30 MG 24 hr tablet  Commonly known as:  IMDUR  Take 1 tablet (30 mg total) by mouth daily. Long acting nitroglycerin medicine for your heart.     levothyroxine 50 MCG tablet  Commonly known as:  SYNTHROID, LEVOTHROID  Take 50 mcg by mouth daily before breakfast.     lisinopril 10 MG tablet  Commonly known as:  PRINIVIL,ZESTRIL  Take 1 tablet (10 mg total) by mouth daily. New dose of blood pressure medicine.     omeprazole 40 MG capsule  Commonly known as:  PRILOSEC  Take 1 capsule (40 mg total) by mouth daily. For treatment of acid indigestion/reflux.     simvastatin 40 MG tablet  Commonly known as:  ZOCOR  Take 20 mg by mouth at bedtime.     terazosin 1 MG capsule  Commonly known as:  HYTRIN  Take 1 mg by mouth at bedtime.       Allergies  Allergen Reactions  . Codeine Shortness Of Breath    Shortness of breath       Follow-up Information   Follow up with Eula Listen, MD On 11/11/2012. (at 10:30 am)    Specialty:  Gastroenterology   Contact information:   [address] [place] [address] [phone]       Follow up with [facility] [place] On 10/25/2012. (at 1:00 pm)    Specialty:  Cardiology   Contact information:   [address] [place] [address] [phone]       The results of significant diagnostics from this hospitalization (including imaging, microbiology, ancillary and laboratory) are listed below for reference.    Significant Diagnostic Studies: Dg Chest 2 View  10/09/2012   *RADIOLOGY REPORT*  Clinical Data: Chest pain  CHEST - 2 VIEW  Comparison: November 29, 2012.  Findings: Cardiomediastinal silhouette appears normal.  No pneumothorax is noted.  No significant pleural effusions are noted. No acute pulmonary disease is noted.  Bilateral basilar scarring is again noted and unchanged.  IMPRESSION: No acute  cardiopulmonary abnormality seen.   Original Report Authenticated By: Lupita Raider.,  M.D.   Ct Angio Chest Pe W/cm &/or Wo Cm  10/10/2012   *RADIOLOGY REPORT*  Clinical Data: Chest pain.  History of pulmonary embolism  CT ANGIOGRAPHY CHEST  Technique:  Multidetector CT imaging of the chest using the standard protocol during bolus administration of intravenous contrast. Multiplanar reconstructed images including MIPs were obtained and reviewed to evaluate the vascular anatomy.  Contrast: OMNIPAQUE IOHEXOL 350 MG/ML SOLN  Comparison: CTA chest 03/02/2012  Findings: Negative for pulmonary embolism.  Negative for aortic dissection or aneurysm.  Mild atherosclerotic disease in the aortic arch.  Heart size is normal.  Very mild pericardial effusion.  6 mm subpleural nodule right middle lobe unchanged from the CT of 02/23/2006.  There is mild scarring in the right lower lobe.  Mild scarring in the left lower lobe.  3 mm left lower lobe nodule is unchanged from prior studies.  Negative for mass or adenopathy.  No acute infiltrate or effusion.  IMPRESSION: Negative for pulmonary embolism.  Stable bilateral pulmonary nodules consistent with benign etiology.   Original Report Authenticated By: Janeece Riggers, M.D.   US Venous Img Lower Bilateral  10/10/2012   *RADIOLOGY REPORT*  Clinical Data:  Previous right lower  extremity DVT, history of pulmonary embolism, bilateral lower extremity pain and edema  VENOUS DUPLEX ULTRASOUND OF BILATERAL LOWER EXTREMITIES  Technique:  Gray-scale sonography with graded compression, as well as color Doppler and duplex ultrasound, were performed to evaluate the deep venous system of both lower extremities from the level of the common femoral vein through the popliteal and proximal calf veins.  Spectral Doppler was utilized to evaluate flow at rest and with distal augmentation maneuvers.  Comparison:  01/20/2006  Findings: On the right,  there is no evidence of superficial thrombosis.   There is thrombus within the lumen and of the common femoral vein extending down to the level of the junction with the greater saphenous vein.  This portion of the venous system is not compressible.  Thrombus is not of occlusive.  Thrombus does not extend into the saphenous vein.  The remainder of the deep venous system on the right shows normal compressibility with no evidence of thrombus, with normal augmentation and phasicity.  On the left side, there is thrombosis in the mid to distal superficial femoral vein.  There is also thrombus at the birfurcation of the common femoral vein into the superficial and deep femoral veins as well as at the saphenofemoral junction. Thrombus is diffusely non-occlusive, with visual evdience of suggest central recanalization.  Where throbmus is visualized, the vein is not compressible.  There is phasic flow diffusely throughout the deep venous system, and there is augmentation in the proximal common femoral vein.  There is no superificial thrombophlebitis.  IMPRESSION: 1.  DVT on the right, in the area of previous DVT.  The appearance suggests subacute  DVT with some recanalization.  2.  DVT on the left, also with subacute appearance with some recanalization throughout.   Original Report Authenticated By: Esperanza Heir, M.D.   Dg Chest Portable 1 View  09/29/2012   *RADIOLOGY REPORT*  Clinical Data: Shortness of breath.  PORTABLE CHEST - 1 VIEW  Comparison: Chest x-ray 12/24/2011.  Findings: Bibasilar linear opacities are similar to prior examinations, most compatible with areas of mild chronic scarring. No acute consolidative airspace disease.  No pleural effusions.  No evidence of pulmonary edema.  Heart size is within normal limits. Upper mediastinal contours are unremarkable.  Atherosclerosis of the thoracic aorta.  IMPRESSION: 1.  No radiographic evidence of acute cardiopulmonary disease. 2.  Bibasilar scarring is unchanged. 3.  Atherosclerosis.   Original Report  Authenticated By: Trudie Reed, M.D.    Microbiology: No results found for this or any previous visit (from the past 240 hour(s)).   Labs: Basic Metabolic Panel:  Recent Labs Lab 10/09/12 1515 10/10/12 0224  NA 137 139  K 3.8 3.9  CL 101 104  CO2 25 24  GLUCOSE 142* 99  BUN 23 19  CREATININE 1.13 0.99  CALCIUM 9.8 9.7   Liver Function Tests: No results found for this basename: AST, ALT, ALKPHOS, BILITOT, PROT, ALBUMIN,  in the last 168 hours  Recent Labs Lab 10/09/12 2038  LIPASE 47   No results found for this basename: AMMONIA,  in the last 168 hours CBC:  Recent Labs Lab 10/09/12 1515 10/10/12 0224 10/11/12 0512  WBC 3.5* 3.3* 3.4*  HGB 11.8* 11.6* 11.2*  HCT 35.0* 35.1* 33.6*  MCV 94.3 94.9 95.5  PLT 173 167 158   Cardiac Enzymes:  Recent Labs Lab 10/09/12 1515 10/09/12 2038 10/10/12 0223 10/10/12 0856  TROPONINI <0.30 <0.30 <0.30 <0.30   BNP: BNP (last 3 results)  Recent Labs  09/29/12 2348  PROBNP 2067.0*   CBG:  Recent Labs Lab 10/10/12 0729 10/10/12 1117 10/10/12 1626 10/10/12 2123 10/11/12 0844  GLUCAP 99 96 92 138* 108*       Signed:  Llewelyn Sheaffer  [facility] 10/11/2012, 10:23 AM

## 2012-10-11 NOTE — Progress Notes (Signed)
Patient received discharge instructions along with follow up appointments and prescriptions. Patient verbalized understanding of all instructions. Patient was escorted by staff via wheelchair to vehicle. Patient discharged to home in stable condition. 

## 2012-10-25 ENCOUNTER — Encounter: Payer: Self-pay | Admitting: Adult Health

## 2012-10-25 ENCOUNTER — Ambulatory Visit (INDEPENDENT_AMBULATORY_CARE_PROVIDER_SITE_OTHER): Payer: Medicare Other | Admitting: Adult Health

## 2012-10-25 VITALS — BP 132/65 | HR 65 | Ht 68.0 in | Wt 180.0 lb

## 2012-10-25 DIAGNOSIS — I10 Essential (primary) hypertension: Secondary | ICD-10-CM

## 2012-10-25 DIAGNOSIS — E785 Hyperlipidemia, unspecified: Secondary | ICD-10-CM

## 2012-10-25 DIAGNOSIS — Z7901 Long term (current) use of anticoagulants: Secondary | ICD-10-CM

## 2012-10-25 DIAGNOSIS — I4891 Unspecified atrial fibrillation: Secondary | ICD-10-CM

## 2012-10-25 NOTE — Progress Notes (Signed)
HPI: [name]  is a- 79 year-old patient of Dr. Beulah Gandy, (formerly Dr. Tresa Endo of [facility]);we are following for ongoing assessment and management of nonobstructive CAD, per cardiac catheterization in 2011 history of lower ext DVT and PE,  chronic atrial fibrillation on coumadin,, and bradycardia. Echocardiogram completed during recent hospitalization at [facility] for recurrent chest discomfort demonstrated LVEF of 60-65%, a diastolic function was not measured due to underlying atrial fibrillation. He was admitted at that time for typical chest pain, was diagnosed with exacerbation of GERD. He was started apparently on Pepcid twice a day, an outpatient EGD was planned.    He is doing well. No complaints of chest pain or dyspnea. He denies any chest pain. He remains active as he can be. Uses walker for ambulation.  Allergies  Allergen Reactions  . Codeine Shortness Of Breath    Shortness of breath    Current Outpatient Prescriptions  Medication Sig Dispense Refill  . enoxaparin (LOVENOX) 120 MG/0.8ML injection Inject 0.8 mLs (120 mg total) into the skin daily. This is a dose increase from your previous dose.  31 Syringe  6  . isosorbide mononitrate (IMDUR) 30 MG 24 hr tablet Take 1 tablet (30 mg total) by mouth daily. Long acting nitroglycerin medicine for your heart.  30 tablet  6  . levothyroxine (SYNTHROID, LEVOTHROID) 50 MCG tablet Take 50 mcg by mouth daily before breakfast.      . metoprolol (LOPRESSOR) 50 MG tablet Take 50 mg by mouth 2 (two) times daily.      Marland Kitchen omeprazole (PRILOSEC) 40 MG capsule Take 1 capsule (40 mg total) by mouth daily. For treatment of acid indigestion/reflux.  30 capsule  6  . simvastatin (ZOCOR) 40 MG tablet Take 20 mg by mouth at bedtime.        Marland Kitchen terazosin (HYTRIN) 1 MG capsule Take 1 mg by mouth at bedtime.       No current facility-administered medications for this visit.    Past Medical History  Diagnosis Date  . Colon cancer     Status post partial  colectomy  . Hypertension   . Hypercholesterolemia   . Blood transfusion   . Coronary artery disease 2011    Non obstructive CAD  . DVT (deep venous thrombosis)   . PE (pulmonary embolism)   . Diabetes mellitus without complication   . A-fib   . Diabetes mellitus, type II   . Ventral hernia   . Right bundle branch block   . Unspecified hypothyroidism   . LVH (left ventricular hypertrophy) 10/10/2012    Ejection fraction 60-65%.    Past Surgical History  Procedure Laterality Date  . Abdominal surgery      x 3  . Cardiac catheterization  2008    WUJ:WJXBJY of systems complete and found to be negative unless listed above  PHYSICAL EXAM BP 132/65  Pulse 65  Ht 5\' 8"  (1.727 m)  Wt 180 lb (81.647 kg)  BMI 27.38 kg/m2 General: Well developed, well nourished, in no acute distress Head: Eyes PERRLA, No xanthomas.   Normal cephalic and atramatic  Lungs: Clear bilaterally some crackles in the bases without wheezes.  Heart: HRIR S1 S2, with 2/6.  Pulses are 2+ & equal.            No carotid bruit. No JVD.  Abdomen: Bowel sounds are positive, abdomen soft and non-tender without masses or  Hernia's noted. Msk:  Back normal, normal gait. Normal strength and tone for age. Extremities: No clubbing, cyanosis or edema.  DP +1 Neuro: Alert and oriented X 3. Psych:  Good affect, responds appropriately    ASSESSMENT AND PLAN

## 2012-10-25 NOTE — Assessment & Plan Note (Signed)
Remains on LMWH as directed for PE and DVT. He is medically compliant.

## 2012-10-25 NOTE — Progress Notes (Deleted)
Name: Bryce Mcdonald    DOB: March 13, 1921  Age: 79 y.o.  MR#: [id number]       PCP:  No PCP Per Patient      Insurance: Payor: MEDICARE / Plan: MEDICARE PART B / Product Type: *No Product type* /   CC:    Chief Complaint  Patient presents with  . Atrial Fibrillation  . Hypertension    VS Filed Vitals:   10/25/12 1320  BP: 132/65  Pulse: 65  Height: 5\' 8"  (1.727 m)  Weight: 180 lb (81.647 kg)    Weights Current Weight  10/25/12 180 lb (81.647 kg)  10/11/12 177 lb 7.5 oz (80.5 kg)  09/30/12 177 lb 4 oz (80.4 kg)    Blood Pressure  BP Readings from Last 3 Encounters:  10/25/12 132/65  10/11/12 137/79  09/30/12 108/50     Admit date:  (Not on file) Last encounter with RMR:  Visit date not found   Allergy Codeine  Current Outpatient Prescriptions  Medication Sig Dispense Refill  . enoxaparin (LOVENOX) 120 MG/0.8ML injection Inject 0.8 mLs (120 mg total) into the skin daily. This is a dose increase from your previous dose.  31 Syringe  6  . isosorbide mononitrate (IMDUR) 30 MG 24 hr tablet Take 1 tablet (30 mg total) by mouth daily. Long acting nitroglycerin medicine for your heart.  30 tablet  6  . levothyroxine (SYNTHROID, LEVOTHROID) 50 MCG tablet Take 50 mcg by mouth daily before breakfast.      . metoprolol (LOPRESSOR) 50 MG tablet Take 50 mg by mouth 2 (two) times daily.      Marland Kitchen omeprazole (PRILOSEC) 40 MG capsule Take 1 capsule (40 mg total) by mouth daily. For treatment of acid indigestion/reflux.  30 capsule  6  . simvastatin (ZOCOR) 40 MG tablet Take 20 mg by mouth at bedtime.        Marland Kitchen terazosin (HYTRIN) 1 MG capsule Take 1 mg by mouth at bedtime.       No current facility-administered medications for this visit.    Discontinued Meds:    Medications Discontinued During This Encounter  Medication Reason  . hydrochlorothiazide (HYDRODIURIL) 25 MG tablet Error  . lisinopril (PRINIVIL,ZESTRIL) 10 MG tablet Error    Patient Active Problem List   Diagnosis Date  Noted  . LVH (left ventricular hypertrophy) 10/11/2012  . DVT, bilateral lower limbs 10/10/2012  . Chest pain 10/09/2012  . Abdominal pain, epigastric 10/09/2012  . Dysphagia 10/09/2012  . History of pulmonary embolism 10/09/2012  . Chronic anticoagulation 10/09/2012  . Leukopenia 10/09/2012  . Type 2 diabetes mellitus 10/09/2012  . Right bundle branch block 10/09/2012  . Essential hypertension, benign 10/09/2012  . Unspecified hypothyroidism 10/09/2012  . Anemia 10/09/2012  . Atrial fibrillation with slow ventricular response 09/30/2012  . Difficulty breathing 09/30/2012  . UTI (lower urinary tract infection) 10/16/2010  . HTN (hypertension) 10/16/2010  . Prostate cancer 10/16/2010  . Colon cancer 10/16/2010  . Hyperlipidemia 10/16/2010  . Ventral hernia 10/16/2010    LABS    Component Value Date/Time   NA 139 10/10/2012 0224   NA 137 10/09/2012 1515   NA 144 09/30/2012 0552   K 3.9 10/10/2012 0224   K 3.8 10/09/2012 1515   K 4.2 09/30/2012 0552   CL 104 10/10/2012 0224   CL 101 10/09/2012 1515   CL 107 09/30/2012 0552   CO2 24 10/10/2012 0224   CO2 25 10/09/2012 1515   CO2 30 09/30/2012 0552   GLUCOSE  99 10/10/2012 0224   GLUCOSE 142* 10/09/2012 1515   GLUCOSE 105* 09/30/2012 0552   BUN 19 10/10/2012 0224   BUN 23 10/09/2012 1515   BUN 22 09/30/2012 0552   CREATININE 0.99 10/10/2012 0224   CREATININE 1.13 10/09/2012 1515   CREATININE 1.19 09/30/2012 0552   CALCIUM 9.7 10/10/2012 0224   CALCIUM 9.8 10/09/2012 1515   CALCIUM 10.0 09/30/2012 0552   GFRNONAA 71* 10/10/2012 0224   GFRNONAA 56* 10/09/2012 1515   GFRNONAA 53* 09/30/2012 0552   GFRAA 82* 10/10/2012 0224   GFRAA 65* 10/09/2012 1515   GFRAA 61* 09/30/2012 0552   CMP     Component Value Date/Time   NA 139 10/10/2012 0224   K 3.9 10/10/2012 0224   CL 104 10/10/2012 0224   CO2 24 10/10/2012 0224   GLUCOSE 99 10/10/2012 0224   BUN 19 10/10/2012 0224   CREATININE 0.99 10/10/2012 0224   CALCIUM 9.7 10/10/2012 0224   PROT 6.6  09/30/2012 0552   ALBUMIN 3.7 09/30/2012 0552   AST 12 09/30/2012 0552   ALT 6 09/30/2012 0552   ALKPHOS 69 09/30/2012 0552   BILITOT 0.3 09/30/2012 0552   GFRNONAA 71* 10/10/2012 0224   GFRAA 82* 10/10/2012 0224       Component Value Date/Time   WBC 3.4* 10/11/2012 0512   WBC 3.3* 10/10/2012 0224   WBC 3.5* 10/09/2012 1515   HGB 11.2* 10/11/2012 0512   HGB 11.6* 10/10/2012 0224   HGB 11.8* 10/09/2012 1515   HCT 33.6* 10/11/2012 0512   HCT 35.1* 10/10/2012 0224   HCT 35.0* 10/09/2012 1515   MCV 95.5 10/11/2012 0512   MCV 94.9 10/10/2012 0224   MCV 94.3 10/09/2012 1515    Lipid Panel  No results found for this basename: chol, trig, hdl, cholhdl, vldl, ldlcalc    ABG No results found for this basename: phart, pco2, pco2art, po2, po2art, hco3, tco2, acidbasedef, o2sat     Lab Results  Component Value Date   TSH 3.190 10/10/2012   BNP (last 3 results)  Recent Labs  09/29/12 2348  PROBNP 2067.0*   Cardiac Panel (last 3 results) No results found for this basename: CKTOTAL, CKMB, TROPONINI, RELINDX,  in the last 72 hours  Iron/TIBC/Ferritin    Component Value Date/Time   IRON 71 10/10/2012 0224   TIBC 239 10/10/2012 0224   FERRITIN 313 10/10/2012 0224     EKG Orders placed during the hospital encounter of 10/09/12  . EKG 12-LEAD  . EKG 12-LEAD  . EKG     Prior Assessment and Plan Problem List as of 10/25/2012     Cardiovascular and Mediastinum   HTN (hypertension)   Atrial fibrillation with slow ventricular response   Right bundle branch block   Essential hypertension, benign   DVT, bilateral lower limbs   LVH (left ventricular hypertrophy)     Digestive   Colon cancer   Dysphagia     Endocrine   Type 2 diabetes mellitus   Unspecified hypothyroidism     Genitourinary   Prostate cancer   UTI (lower urinary tract infection)     Other   Hyperlipidemia   Ventral hernia   Difficulty breathing   Chest pain   Abdominal pain, epigastric   History of pulmonary embolism    Chronic anticoagulation   Leukopenia   Anemia       Imaging: Dg Chest 2 View  10/09/2012   *RADIOLOGY REPORT*  Clinical Data: Chest pain  CHEST - 2 VIEW  Comparison: November 29, 2012.  Findings: Cardiomediastinal silhouette appears normal.  No pneumothorax is noted.  No significant pleural effusions are noted. No acute pulmonary disease is noted.  Bilateral basilar scarring is again noted and unchanged.  IMPRESSION: No acute cardiopulmonary abnormality seen.   Original Report Authenticated By: Lupita Raider.,  M.D.   Ct Angio Chest Pe W/cm &/or Wo Cm  10/10/2012   *RADIOLOGY REPORT*  Clinical Data: Chest pain.  History of pulmonary embolism  CT ANGIOGRAPHY CHEST  Technique:  Multidetector CT imaging of the chest using the standard protocol during bolus administration of intravenous contrast. Multiplanar reconstructed images including MIPs were obtained and reviewed to evaluate the vascular anatomy.  Contrast: OMNIPAQUE IOHEXOL 350 MG/ML SOLN  Comparison: CTA chest 03/02/2012  Findings: Negative for pulmonary embolism.  Negative for aortic dissection or aneurysm.  Mild atherosclerotic disease in the aortic arch.  Heart size is normal.  Very mild pericardial effusion.  6 mm subpleural nodule right middle lobe unchanged from the CT of 02/23/2006.  There is mild scarring in the right lower lobe.  Mild scarring in the left lower lobe.  3 mm left lower lobe nodule is unchanged from prior studies.  Negative for mass or adenopathy.  No acute infiltrate or effusion.  IMPRESSION: Negative for pulmonary embolism.  Stable bilateral pulmonary nodules consistent with benign etiology.   Original Report Authenticated By: Janeece Riggers, M.D.   US Venous Img Lower Bilateral  10/10/2012   *RADIOLOGY REPORT*  Clinical Data:  Previous right lower extremity DVT, history of pulmonary embolism, bilateral lower extremity pain and edema  VENOUS DUPLEX ULTRASOUND OF BILATERAL LOWER EXTREMITIES  Technique:  Gray-scale  sonography with graded compression, as well as color Doppler and duplex ultrasound, were performed to evaluate the deep venous system of both lower extremities from the level of the common femoral vein through the popliteal and proximal calf veins.  Spectral Doppler was utilized to evaluate flow at rest and with distal augmentation maneuvers.  Comparison:  01/20/2006  Findings: On the right,  there is no evidence of superficial thrombosis.  There is thrombus within the lumen and of the common femoral vein extending down to the level of the junction with the greater saphenous vein.  This portion of the venous system is not compressible.  Thrombus is not of occlusive.  Thrombus does not extend into the saphenous vein.  The remainder of the deep venous system on the right shows normal compressibility with no evidence of thrombus, with normal augmentation and phasicity.  On the left side, there is thrombosis in the mid to distal superficial femoral vein.  There is also thrombus at the birfurcation of the common femoral vein into the superficial and deep femoral veins as well as at the saphenofemoral junction. Thrombus is diffusely non-occlusive, with visual evdience of suggest central recanalization.  Where throbmus is visualized, the vein is not compressible.  There is phasic flow diffusely throughout the deep venous system, and there is augmentation in the proximal common femoral vein.  There is no superificial thrombophlebitis.  IMPRESSION: 1.  DVT on the right, in the area of previous DVT.  The appearance suggests subacute  DVT with some recanalization.  2.  DVT on the left, also with subacute appearance with some recanalization throughout.   Original Report Authenticated By: Esperanza Heir, M.D.   Dg Chest Portable 1 View  09/29/2012   *RADIOLOGY REPORT*  Clinical Data: Shortness of breath.  PORTABLE CHEST - 1 VIEW  Comparison: Chest  x-ray 12/24/2011.  Findings: Bibasilar linear opacities are similar to prior  examinations, most compatible with areas of mild chronic scarring. No acute consolidative airspace disease.  No pleural effusions.  No evidence of pulmonary edema.  Heart size is within normal limits. Upper mediastinal contours are unremarkable.  Atherosclerosis of the thoracic aorta.  IMPRESSION: 1.  No radiographic evidence of acute cardiopulmonary disease. 2.  Bibasilar scarring is unchanged. 3.  Atherosclerosis.   Original Report Authenticated By: Trudie Reed, M.D.

## 2012-10-25 NOTE — Assessment & Plan Note (Signed)
He continues on simvastatin as directed. He is followed by PCP for ongoing labs.

## 2012-10-25 NOTE — Patient Instructions (Addendum)
Your physician recommends that you schedule a follow-up appointment in: 6 months You will receive a reminder letter two months in advance reminding you to call and schedule your appointment. If you don't receive this letter, please contact our office.  Your physician recommends that you continue on your current medications as directed. Please refer to the Current Medication list given to you today.     

## 2012-10-25 NOTE — Assessment & Plan Note (Signed)
Blood pressure is well controlled under current medication regimen. No changes in current regimen. We will see him in 6 months.

## 2012-11-10 ENCOUNTER — Inpatient Hospital Stay (HOSPITAL_COMMUNITY)
Admission: EM | Admit: 2012-11-10 | Discharge: 2012-11-14 | DRG: 293 | Disposition: A | Discharge status: Home Health | Payer: Non-veteran care | Attending: Internal Medicine | Admitting: Internal Medicine

## 2012-11-10 ENCOUNTER — Emergency Department (HOSPITAL_COMMUNITY): Payer: Non-veteran care

## 2012-11-10 ENCOUNTER — Encounter (HOSPITAL_COMMUNITY): Payer: Self-pay | Admitting: Emergency Medicine

## 2012-11-10 DIAGNOSIS — Z9049 Acquired absence of other specified parts of digestive tract: Secondary | ICD-10-CM

## 2012-11-10 DIAGNOSIS — R0902 Hypoxemia: Secondary | ICD-10-CM

## 2012-11-10 DIAGNOSIS — E7800 Pure hypercholesterolemia, unspecified: Secondary | ICD-10-CM | POA: Diagnosis present

## 2012-11-10 DIAGNOSIS — D638 Anemia in other chronic diseases classified elsewhere: Secondary | ICD-10-CM | POA: Diagnosis present

## 2012-11-10 DIAGNOSIS — Z86718 Personal history of other venous thrombosis and embolism: Secondary | ICD-10-CM

## 2012-11-10 DIAGNOSIS — I5033 Acute on chronic diastolic (congestive) heart failure: Secondary | ICD-10-CM | POA: Diagnosis not present

## 2012-11-10 DIAGNOSIS — R0602 Shortness of breath: Secondary | ICD-10-CM | POA: Diagnosis not present

## 2012-11-10 DIAGNOSIS — I2510 Atherosclerotic heart disease of native coronary artery without angina pectoris: Secondary | ICD-10-CM | POA: Diagnosis present

## 2012-11-10 DIAGNOSIS — D72819 Decreased white blood cell count, unspecified: Secondary | ICD-10-CM | POA: Diagnosis present

## 2012-11-10 DIAGNOSIS — Z87891 Personal history of nicotine dependence: Secondary | ICD-10-CM

## 2012-11-10 DIAGNOSIS — I509 Heart failure, unspecified: Secondary | ICD-10-CM | POA: Diagnosis present

## 2012-11-10 DIAGNOSIS — I4510 Unspecified right bundle-branch block: Secondary | ICD-10-CM

## 2012-11-10 DIAGNOSIS — I82403 Acute embolism and thrombosis of unspecified deep veins of lower extremity, bilateral: Secondary | ICD-10-CM | POA: Diagnosis present

## 2012-11-10 DIAGNOSIS — E785 Hyperlipidemia, unspecified: Secondary | ICD-10-CM

## 2012-11-10 DIAGNOSIS — I4891 Unspecified atrial fibrillation: Secondary | ICD-10-CM | POA: Diagnosis present

## 2012-11-10 DIAGNOSIS — I10 Essential (primary) hypertension: Secondary | ICD-10-CM | POA: Diagnosis present

## 2012-11-10 DIAGNOSIS — E119 Type 2 diabetes mellitus without complications: Secondary | ICD-10-CM | POA: Diagnosis present

## 2012-11-10 DIAGNOSIS — I444 Left anterior fascicular block: Secondary | ICD-10-CM

## 2012-11-10 DIAGNOSIS — Z9119 Patient's noncompliance with other medical treatment and regimen: Secondary | ICD-10-CM

## 2012-11-10 DIAGNOSIS — Z7901 Long term (current) use of anticoagulants: Secondary | ICD-10-CM

## 2012-11-10 DIAGNOSIS — Z86711 Personal history of pulmonary embolism: Secondary | ICD-10-CM

## 2012-11-10 DIAGNOSIS — E039 Hypothyroidism, unspecified: Secondary | ICD-10-CM | POA: Diagnosis present

## 2012-11-10 DIAGNOSIS — K219 Gastro-esophageal reflux disease without esophagitis: Secondary | ICD-10-CM | POA: Diagnosis present

## 2012-11-10 DIAGNOSIS — Z85038 Personal history of other malignant neoplasm of large intestine: Secondary | ICD-10-CM

## 2012-11-10 DIAGNOSIS — Z91199 Patient's noncompliance with other medical treatment and regimen due to unspecified reason: Secondary | ICD-10-CM

## 2012-11-10 DIAGNOSIS — I517 Cardiomegaly: Secondary | ICD-10-CM | POA: Diagnosis present

## 2012-11-10 DIAGNOSIS — J9801 Acute bronchospasm: Secondary | ICD-10-CM | POA: Diagnosis present

## 2012-11-10 LAB — URINALYSIS, ROUTINE W REFLEX MICROSCOPIC
Glucose, UA: NEGATIVE mg/dL
Ketones, ur: NEGATIVE mg/dL
Nitrite: NEGATIVE
Protein, ur: NEGATIVE mg/dL
Specific Gravity, Urine: >1.03 — ABNORMAL HIGH (ref 1.005–1.030)
Urobilinogen, UA: 0.2 mg/dL (ref 0.0–1.0)
pH: 6 (ref 5.0–8.0)

## 2012-11-10 LAB — COMPREHENSIVE METABOLIC PANEL
ALT: 56 U/L — ABNORMAL HIGH (ref 0–53)
AST: 49 U/L — ABNORMAL HIGH (ref 0–37)
BUN: 15 mg/dL (ref 6–23)
CO2: 30 mEq/L (ref 19–32)
Calcium: 9.7 mg/dL (ref 8.4–10.5)
Chloride: 102 mEq/L (ref 96–112)
Creatinine, Ser: 1.12 mg/dL (ref 0.50–1.35)
GFR calc Af Amer: 66 mL/min — ABNORMAL LOW (ref >90)
GFR calc non Af Amer: 57 mL/min — ABNORMAL LOW (ref >90)
Glucose, Bld: 115 mg/dL — ABNORMAL HIGH (ref 70–99)
Potassium: 3.9 mEq/L (ref 3.5–5.1)
Sodium: 137 mEq/L (ref 135–145)
Total Protein: 6.6 g/dL (ref 6.0–8.3)

## 2012-11-10 LAB — URINE MICROSCOPIC-ADD ON

## 2012-11-10 LAB — CBC WITH DIFFERENTIAL/PLATELET
Eosinophils Absolute: 0 10*3/uL (ref 0.0–0.7)
Eosinophils Relative: 1 % (ref 0–5)
HCT: 31.8 % — ABNORMAL LOW (ref 39.0–52.0)
Hemoglobin: 10.2 g/dL — ABNORMAL LOW (ref 13.0–17.0)
Lymphocytes Relative: 54 % — ABNORMAL HIGH (ref 12–46)
Lymphs Abs: 1.7 10*3/uL (ref 0.7–4.0)
MCH: 31.9 pg (ref 26.0–34.0)
MCV: 99.4 fL (ref 78.0–100.0)
Monocytes Absolute: 0.3 10*3/uL (ref 0.1–1.0)
Monocytes Relative: 9 % (ref 3–12)
Neutro Abs: 1.2 10*3/uL — ABNORMAL LOW (ref 1.7–7.7)
Platelets: 146 10*3/uL — ABNORMAL LOW (ref 150–400)
RBC: 3.2 MIL/uL — ABNORMAL LOW (ref 4.22–5.81)
RDW: 14.6 % (ref 11.5–15.5)
WBC: 3.2 10*3/uL — ABNORMAL LOW (ref 4.0–10.5)

## 2012-11-10 LAB — PRO B NATRIURETIC PEPTIDE: Pro B Natriuretic peptide (BNP): 1765 pg/mL — ABNORMAL HIGH (ref 0–450)

## 2012-11-10 LAB — PROTIME-INR: INR: 1.07 (ref 0.00–1.49)

## 2012-11-10 LAB — TROPONIN I: Troponin I: <0.3 ng/mL (ref <0.30)

## 2012-11-10 LAB — CG4 I-STAT (LACTIC ACID): Lactic Acid, Venous: 1.13 mmol/L (ref 0.5–2.2)

## 2012-11-10 MED ORDER — ALBUTEROL SULFATE (5 MG/ML) 0.5% IN NEBU
5.0000 mg | INHALATION_SOLUTION | Freq: Once | RESPIRATORY_TRACT | Status: AC
  Administered 2012-11-10: 5 mg via RESPIRATORY_TRACT
  Filled 2012-11-10: qty 1

## 2012-11-10 MED ORDER — METHYLPREDNISOLONE SODIUM SUCC 125 MG IJ SOLR
125.0000 mg | Freq: Once | INTRAMUSCULAR | Status: AC
  Administered 2012-11-10: 125 mg via INTRAVENOUS
  Filled 2012-11-10: qty 2

## 2012-11-10 NOTE — ED Notes (Signed)
EMS arrives, pt called out for SOB, 12 lead from EMS shows afib. IV started, coarse breath sounds. Pt on 2L 02, after desat in dept.

## 2012-11-10 NOTE — ED Provider Notes (Signed)
CSN: [id number]     Arrival date & time 11/10/12  2110 History  This chart was scribed for Bryce Horn, MD by Ardelia Mems, ED Scribe. This patient was seen in room APA08/APA08 and the patient's care was started at 9:30 PM.  Chief Complaint  Patient presents with  . Shortness of Breath  . Bradycardia  . Chest Pain    The history is provided by the patient and the EMS personnel. No language interpreter was used.    HPI Comments: Bryce Mcdonald is a 79 y.o. male with a history of DVT, PE and chronic slow Atrial fibrillation brought by EMS to the Emergency Department complaining of acutely worsened SOB today, beyond the SOB he has experienced daily for the past few months. He also reports that he has some bilateral lower leg swelling at baseline, and that this has worsened beyond baseline today. He also complains of upper left chest wall pain described as tenderness that has been constant for the past week. Pt has a history of heart failure, and states that current symptoms feel similar. He was also recently admitted for atypical chest pain. He lives alone at home. He states that he is not on supplemental oxygen at home. Pt states that he is generally weak at baseline, and uses a wheelchair and a walker. Pt receives Lovenox shots for blood clots in legs and lungs. He denies fever, cough, confusion, weakness or numbness, abdominal pain, diarrhea or any other symptoms.   Past Medical History  Diagnosis Date  . Colon cancer     Status post partial colectomy  . Hypertension   . Hypercholesterolemia   . Blood transfusion   . Coronary artery disease 2011    Non obstructive CAD  . DVT (deep venous thrombosis)   . PE (pulmonary embolism)   . Diabetes mellitus without complication   . A-fib   . Diabetes mellitus, type II   . Ventral hernia   . Right bundle branch block   . Unspecified hypothyroidism   . LVH (left ventricular hypertrophy) 10/10/2012    Ejection fraction 60-65%.   Past  Surgical History  Procedure Laterality Date  . Abdominal surgery      x 3  . Cardiac catheterization  2008   No family history on file. History  Substance Use Topics  . Smoking status: Former Games developer  . Smokeless tobacco: Not on file     Comment: quit over 40 years ago  . Alcohol Use: No     Comment: quit over forty years ago    Review of Systems 10 Systems reviewed and are negative for acute change except as noted in the HPI.  Allergies  Codeine  Home Medications   No current outpatient prescriptions on file. Triage Vitals: BP 135/74  Pulse 65  Temp(Src) 97.6 F (36.4 C) (Oral)  Resp 20  Ht 5\' 8"  (1.727 m)  Wt 180 lb (81.647 kg)  BMI 27.38 kg/m2  SpO2 100%  Physical Exam  Nursing note and vitals reviewed. Constitutional:  Awake, alert, nontoxic appearance.  HENT:  Head: Atraumatic.  Eyes: Right eye exhibits no discharge. Left eye exhibits no discharge.  Neck: Neck supple.  Cardiovascular:  No murmur heard. Irregularly irregular. Bradycardic.  Pulmonary/Chest: Effort normal. He has wheezes. He exhibits no tenderness.  Scattered faint wheezes, bibasilar crackles.  Abdominal: Soft. There is no tenderness. There is no rebound.  Musculoskeletal: He exhibits edema. He exhibits no tenderness.  Baseline ROM, no obvious new focal weakness. Trace edema  to bilateral feet.  Neurological:  Mental status and motor strength appears baseline for patient and situation.  Skin: No rash noted.  Psychiatric: He has a normal mood and affect.    ED Course  Procedures (including critical care time) ECG: Atrial fibrillation with slow ventricular response, ventricular rate 53, right axis deviation, right bundle branch block, no acute ischemic changes noted, no significant change noted compared with August 2014 DIAGNOSTIC STUDIES: Oxygen Saturation is 100% on RA, normal by my interpretation.    COORDINATION OF CARE: 9:35 PM- Pt advised of plan for diagnostic lab work and  radiology, along with plan to receive medications in the ED and pt agrees. Pt felt better with neb in ED. RA sat 85% in ED with walking (not on home O2), so will contact [facility] for admit. 0030 D/w [facility] for admit. 0135 Labs Review Labs Reviewed  CBC WITH DIFFERENTIAL - Abnormal; Notable for the following:    WBC 3.2 (*)    RBC 3.20 (*)    Hemoglobin 10.2 (*)    HCT 31.8 (*)    Platelets 146 (*)    Neutrophils Relative % 37 (*)    Neutro Abs 1.2 (*)    Lymphocytes Relative 54 (*)    All other components within normal limits  COMPREHENSIVE METABOLIC PANEL - Abnormal; Notable for the following:    Glucose, Bld 115 (*)    AST 49 (*)    ALT 56 (*)    Total Bilirubin 0.2 (*)    GFR calc non Af Amer 57 (*)    GFR calc Af Amer 66 (*)    All other components within normal limits  PRO B NATRIURETIC PEPTIDE - Abnormal; Notable for the following:    Pro B Natriuretic peptide (BNP) 1765.0 (*)    All other components within normal limits  URINALYSIS, ROUTINE W REFLEX MICROSCOPIC - Abnormal; Notable for the following:    Specific Gravity, Urine >1.030 (*)    Hgb urine dipstick TRACE (*)    All other components within normal limits  URINE MICROSCOPIC-ADD ON - Abnormal; Notable for the following:    Squamous Epithelial / LPF MANY (*)    Bacteria, UA MANY (*)    All other components within normal limits  COMPREHENSIVE METABOLIC PANEL - Abnormal; Notable for the following:    Glucose, Bld 181 (*)    AST 38 (*)    GFR calc non Af Amer 60 (*)    GFR calc Af Amer 69 (*)    All other components within normal limits  CBC - Abnormal; Notable for the following:    WBC 3.3 (*)    RBC 3.47 (*)    Hemoglobin 11.1 (*)    HCT 33.7 (*)    All other components within normal limits  GLUCOSE, CAPILLARY - Abnormal; Notable for the following:    Glucose-Capillary 159 (*)    All other components within normal limits  GLUCOSE, CAPILLARY - Abnormal; Notable for the following:    Glucose-Capillary 166 (*)     All other components within normal limits  GLUCOSE, CAPILLARY - Abnormal; Notable for the following:    Glucose-Capillary 121 (*)    All other components within normal limits  GLUCOSE, CAPILLARY - Abnormal; Notable for the following:    Glucose-Capillary 126 (*)    All other components within normal limits  TROPONIN I  PROTIME-INR  TSH  TROPONIN I  TROPONIN I  TROPONIN I  BASIC METABOLIC PANEL  CBC  CG4 I-STAT (  LACTIC ACID)   Imaging Review Dg Chest Portable 1 View  11/10/2012   CLINICAL DATA:  Shortness of Breath.  EXAM: PORTABLE CHEST - 1 VIEW  COMPARISON:  CT chest 10/10/2012 and plain films of the chest 09/2012 and 12/24/2011.  FINDINGS: There is chronic scarring in the lung bases. Emphysematous disease is identified. No consolidative process, pneumothorax or effusion is seen. Heart size is normal. No focal bony abnormality.  IMPRESSION: No acute finding. Stable compared to prior exams.   Electronically Signed   By: Drusilla Kanner M.D.   On: 11/10/2012 21:40    MDM   1. Heart failure   2. Hypoxia   3. Bronchospasm   4. Chronic anticoagulation   5. DVT, bilateral lower limbs   6. History of pulmonary embolism   7. Atrial fibrillation with slow ventricular response   8. HTN (hypertension)   9. Type 2 diabetes mellitus   10. LVH (left ventricular hypertrophy)   11. Right bundle branch block   12. Acute on chronic diastolic CHF (congestive heart failure), NYHA class 3   13. LAFB (left anterior fascicular block)    The patient appears reasonably stabilized for admission considering the current resources, flow, and capabilities available in the ED at this time, and I doubt any other [facility] requiring further screening and/or treatment in the ED prior to admission. I personally performed the services described in this documentation, which was scribed in my presence. The recorded information has been reviewed and is accurate.    Bryce Horn, MD 11/11/12 2234

## 2012-11-11 ENCOUNTER — Inpatient Hospital Stay: Payer: Medicare Other | Admitting: Gastroenterology

## 2012-11-11 ENCOUNTER — Telehealth: Payer: Self-pay | Admitting: Gastroenterology

## 2012-11-11 DIAGNOSIS — I509 Heart failure, unspecified: Secondary | ICD-10-CM

## 2012-11-11 DIAGNOSIS — K219 Gastro-esophageal reflux disease without esophagitis: Secondary | ICD-10-CM | POA: Diagnosis present

## 2012-11-11 DIAGNOSIS — Z87891 Personal history of nicotine dependence: Secondary | ICD-10-CM | POA: Diagnosis not present

## 2012-11-11 DIAGNOSIS — D638 Anemia in other chronic diseases classified elsewhere: Secondary | ICD-10-CM | POA: Diagnosis present

## 2012-11-11 DIAGNOSIS — I4510 Unspecified right bundle-branch block: Secondary | ICD-10-CM

## 2012-11-11 DIAGNOSIS — I4891 Unspecified atrial fibrillation: Secondary | ICD-10-CM

## 2012-11-11 DIAGNOSIS — Z85038 Personal history of other malignant neoplasm of large intestine: Secondary | ICD-10-CM | POA: Diagnosis not present

## 2012-11-11 DIAGNOSIS — Z86718 Personal history of other venous thrombosis and embolism: Secondary | ICD-10-CM | POA: Diagnosis not present

## 2012-11-11 DIAGNOSIS — Z9049 Acquired absence of other specified parts of digestive tract: Secondary | ICD-10-CM | POA: Diagnosis not present

## 2012-11-11 DIAGNOSIS — D72819 Decreased white blood cell count, unspecified: Secondary | ICD-10-CM | POA: Diagnosis present

## 2012-11-11 DIAGNOSIS — I5033 Acute on chronic diastolic (congestive) heart failure: Principal | ICD-10-CM

## 2012-11-11 DIAGNOSIS — E039 Hypothyroidism, unspecified: Secondary | ICD-10-CM | POA: Diagnosis present

## 2012-11-11 DIAGNOSIS — R0602 Shortness of breath: Secondary | ICD-10-CM | POA: Diagnosis present

## 2012-11-11 DIAGNOSIS — I10 Essential (primary) hypertension: Secondary | ICD-10-CM | POA: Diagnosis present

## 2012-11-11 DIAGNOSIS — Z7901 Long term (current) use of anticoagulants: Secondary | ICD-10-CM

## 2012-11-11 DIAGNOSIS — Z86711 Personal history of pulmonary embolism: Secondary | ICD-10-CM | POA: Diagnosis not present

## 2012-11-11 DIAGNOSIS — I4460 Unspecified fascicular block: Secondary | ICD-10-CM

## 2012-11-11 DIAGNOSIS — E7800 Pure hypercholesterolemia, unspecified: Secondary | ICD-10-CM | POA: Diagnosis present

## 2012-11-11 DIAGNOSIS — E119 Type 2 diabetes mellitus without complications: Secondary | ICD-10-CM

## 2012-11-11 DIAGNOSIS — J9801 Acute bronchospasm: Secondary | ICD-10-CM | POA: Diagnosis present

## 2012-11-11 DIAGNOSIS — R0902 Hypoxemia: Secondary | ICD-10-CM | POA: Diagnosis present

## 2012-11-11 DIAGNOSIS — I2510 Atherosclerotic heart disease of native coronary artery without angina pectoris: Secondary | ICD-10-CM | POA: Diagnosis present

## 2012-11-11 DIAGNOSIS — I517 Cardiomegaly: Secondary | ICD-10-CM

## 2012-11-11 DIAGNOSIS — Z9119 Patient's noncompliance with other medical treatment and regimen: Secondary | ICD-10-CM | POA: Diagnosis not present

## 2012-11-11 LAB — GLUCOSE, CAPILLARY
Glucose-Capillary: 159 mg/dL — ABNORMAL HIGH (ref 70–99)
Glucose-Capillary: 166 mg/dL — ABNORMAL HIGH (ref 70–99)
Glucose-Capillary: 121 mg/dL — ABNORMAL HIGH (ref 70–99)

## 2012-11-11 LAB — CBC
HCT: 33.7 % — ABNORMAL LOW (ref 39.0–52.0)
Hemoglobin: 11.1 g/dL — ABNORMAL LOW (ref 13.0–17.0)
MCH: 32 pg (ref 26.0–34.0)
MCHC: 32.9 g/dL (ref 30.0–36.0)
MCV: 97.1 fL (ref 78.0–100.0)
Platelets: 159 10*3/uL (ref 150–400)
RBC: 3.47 MIL/uL — ABNORMAL LOW (ref 4.22–5.81)
RDW: 14.7 % (ref 11.5–15.5)

## 2012-11-11 LAB — TSH: TSH: 1.229 u[IU]/mL (ref 0.350–4.500)

## 2012-11-11 LAB — COMPREHENSIVE METABOLIC PANEL
ALT: 53 U/L (ref 0–53)
AST: 38 U/L — ABNORMAL HIGH (ref 0–37)
Albumin: 4 g/dL (ref 3.5–5.2)
BUN: 16 mg/dL (ref 6–23)
CO2: 27 mEq/L (ref 19–32)
Calcium: 10 mg/dL (ref 8.4–10.5)
Creatinine, Ser: 1.07 mg/dL (ref 0.50–1.35)
Sodium: 137 mEq/L (ref 135–145)
Total Bilirubin: 0.3 mg/dL (ref 0.3–1.2)
Total Protein: 7 g/dL (ref 6.0–8.3)

## 2012-11-11 LAB — TROPONIN I
Troponin I: <0.3 ng/mL (ref <0.30)
Troponin I: <0.3 ng/mL (ref <0.30)
Troponin I: <0.3 ng/mL (ref <0.30)

## 2012-11-11 MED ORDER — FUROSEMIDE 10 MG/ML IJ SOLN
40.0000 mg | Freq: Two times a day (BID) | INTRAMUSCULAR | Status: DC
  Administered 2012-11-11 – 2012-11-12 (×3): 40 mg via INTRAVENOUS
  Filled 2012-11-11 (×3): qty 4

## 2012-11-11 MED ORDER — INSULIN ASPART 100 UNIT/ML ~~LOC~~ SOLN
0.0000 [IU] | Freq: Three times a day (TID) | SUBCUTANEOUS | Status: DC
  Administered 2012-11-11 (×2): 3 [IU] via SUBCUTANEOUS
  Administered 2012-11-11: 2 [IU] via SUBCUTANEOUS

## 2012-11-11 MED ORDER — TERAZOSIN HCL 1 MG PO CAPS
1.0000 mg | ORAL_CAPSULE | Freq: Every day | ORAL | Status: DC
  Administered 2012-11-12 – 2012-11-13 (×3): 1 mg via ORAL
  Filled 2012-11-11 (×3): qty 1

## 2012-11-11 MED ORDER — SIMVASTATIN 20 MG PO TABS
20.0000 mg | ORAL_TABLET | Freq: Every day | ORAL | Status: DC
  Administered 2012-11-12 – 2012-11-13 (×3): 20 mg via ORAL
  Filled 2012-11-11 (×3): qty 1

## 2012-11-11 MED ORDER — FUROSEMIDE 10 MG/ML IJ SOLN
40.0000 mg | Freq: Once | INTRAMUSCULAR | Status: AC
  Administered 2012-11-11: 40 mg via INTRAVENOUS
  Filled 2012-11-11: qty 4

## 2012-11-11 MED ORDER — METOPROLOL TARTRATE 25 MG PO TABS
12.5000 mg | ORAL_TABLET | Freq: Two times a day (BID) | ORAL | Status: DC
  Administered 2012-11-12 – 2012-11-13 (×4): 12.5 mg via ORAL
  Filled 2012-11-11 (×4): qty 1

## 2012-11-11 MED ORDER — SODIUM CHLORIDE 0.9 % IJ SOLN
3.0000 mL | Freq: Two times a day (BID) | INTRAMUSCULAR | Status: DC
  Administered 2012-11-11 – 2012-11-12 (×3): 3 mL via INTRAVENOUS

## 2012-11-11 MED ORDER — ISOSORBIDE MONONITRATE ER 60 MG PO TB24
30.0000 mg | ORAL_TABLET | Freq: Every day | ORAL | Status: DC
  Administered 2012-11-11 – 2012-11-13 (×3): 30 mg via ORAL
  Filled 2012-11-11 (×3): qty 1

## 2012-11-11 MED ORDER — ENOXAPARIN SODIUM 120 MG/0.8ML ~~LOC~~ SOLN
120.0000 mg | Freq: Every day | SUBCUTANEOUS | Status: DC
  Administered 2012-11-11 – 2012-11-13 (×3): 120 mg via SUBCUTANEOUS
  Filled 2012-11-11 (×5): qty 0.8

## 2012-11-11 MED ORDER — INSULIN ASPART 100 UNIT/ML ~~LOC~~ SOLN: 0.0000 [IU] | Freq: Every day | SUBCUTANEOUS | Status: DC

## 2012-11-11 MED ORDER — ALBUTEROL SULFATE (5 MG/ML) 0.5% IN NEBU
2.5000 mg | INHALATION_SOLUTION | Freq: Two times a day (BID) | RESPIRATORY_TRACT | Status: DC
  Administered 2012-11-11 – 2012-11-14 (×6): 2.5 mg via RESPIRATORY_TRACT
  Filled 2012-11-11 (×6): qty 0.5

## 2012-11-11 MED ORDER — SODIUM CHLORIDE 0.9 % IJ SOLN: 3.0000 mL | Freq: Two times a day (BID) | INTRAMUSCULAR | Status: DC

## 2012-11-11 MED ORDER — LISINOPRIL 5 MG PO TABS
2.5000 mg | ORAL_TABLET | Freq: Every day | ORAL | Status: DC
  Administered 2012-11-11: 2.5 mg via ORAL
  Filled 2012-11-11: qty 1

## 2012-11-11 MED ORDER — ALBUTEROL SULFATE HFA 108 (90 BASE) MCG/ACT IN AERS: 2.0000 | INHALATION_SPRAY | Freq: Four times a day (QID) | RESPIRATORY_TRACT | Status: DC | PRN

## 2012-11-11 MED ORDER — POTASSIUM CHLORIDE CRYS ER 20 MEQ PO TBCR
20.0000 meq | EXTENDED_RELEASE_TABLET | Freq: Two times a day (BID) | ORAL | Status: DC
  Administered 2012-11-11 – 2012-11-13 (×6): 20 meq via ORAL
  Filled 2012-11-11 (×6): qty 1

## 2012-11-11 MED ORDER — LEVOTHYROXINE SODIUM 50 MCG PO TABS
50.0000 ug | ORAL_TABLET | Freq: Every day | ORAL | Status: DC
  Administered 2012-11-11 – 2012-11-14 (×4): 50 ug via ORAL
  Filled 2012-11-11 (×4): qty 1

## 2012-11-11 MED ORDER — PANTOPRAZOLE SODIUM 40 MG PO TBEC
40.0000 mg | DELAYED_RELEASE_TABLET | Freq: Every day | ORAL | Status: DC
  Administered 2012-11-11 – 2012-11-13 (×3): 40 mg via ORAL
  Filled 2012-11-11 (×3): qty 1

## 2012-11-11 MED ORDER — METOPROLOL TARTRATE 50 MG PO TABS
50.0000 mg | ORAL_TABLET | Freq: Two times a day (BID) | ORAL | Status: DC
  Administered 2012-11-11: 50 mg via ORAL
  Filled 2012-11-11 (×2): qty 1

## 2012-11-11 MED ORDER — ALBUTEROL SULFATE (5 MG/ML) 0.5% IN NEBU
2.5000 mg | INHALATION_SOLUTION | Freq: Four times a day (QID) | RESPIRATORY_TRACT | Status: DC
  Administered 2012-11-11: 2.5 mg via RESPIRATORY_TRACT
  Filled 2012-11-11: qty 0.5

## 2012-11-11 MED ORDER — LISINOPRIL 10 MG PO TABS
10.0000 mg | ORAL_TABLET | Freq: Every day | ORAL | Status: DC
  Administered 2012-11-12 – 2012-11-13 (×2): 10 mg via ORAL
  Filled 2012-11-11 (×2): qty 1

## 2012-11-11 MED ORDER — ALBUTEROL SULFATE (5 MG/ML) 0.5% IN NEBU
2.5000 mg | INHALATION_SOLUTION | Freq: Once | RESPIRATORY_TRACT | Status: AC
  Administered 2012-11-11: 2.5 mg via RESPIRATORY_TRACT
  Filled 2012-11-11: qty 0.5

## 2012-11-11 NOTE — Consult Note (Signed)
CARDIOLOGY CONSULT NOTE   Patient ID: Bryce Mcdonald MRN: [id number] DOB/AGE: [birth year] 79 y.o.  Admit Date: 11/10/2012 Referring Physician:  PTH Primary Physician: No PCP Per Patient Consulting Cardiologist: Beulah Gandy MD Primary Cardiologist: Beulah Gandy MD Reason for Consultation: Diastolic CHF, Atrial fib Clinical Summary [name] is a 79 y.o.male with known history of nonobstructive CAD, medical management, history of exploratory DVT and PE on chronic LMWH provided by [facility],, atrial fibrillation, and bradycardia. He was admitted after 2 weeks of worsening dyspnea, wheezing, and lower extremity edema. The patient is a difficult historian, lives alone, and cooks for himself. He admits to eating salty foods to include pork. He denies medical noncompliance. Family members state that when they visited him approximately 2 weeks ago his breathing status has begun to deteriorate and he was wheezing. They provided him with his nebulizer treatments.   He was recently discharged from [facility] on 10/11/2012 after admission for atypical chest pain, GERD, ruled out for MI or ACS. Had echocardiogram completed at that time. Was bradycardic without AV nodal blocking agents at home or while hospitalized, although this is listed as one of his home medications on review of list made on admission..    On arrival the emergency room patient's blood pressure was 140/66, heart rate 47 beats per minute with an O2 sat of 100% on 2 L. He is mildly anemic with a hemoglobin of 10.2 and hematocrit of 31.8, white blood cells were 3.2. Platelets 146. Sodium was 137 with a creatinine of 1.12. AST and ALT were elevated. Was found to be negative. Pro BNP 1765. He was given IV Lasix 40 mg x1, Solu-Medrol injection, and albuterol breathing treatment. He has been feeling better with 1.5 liter diureses. He is now complaining of LE cramping. Weight on last office visit on 10/25/2012 was 180 pounds. Weight on admission 196 pounds at  highest recorded.   Allergies  Allergen Reactions  . Codeine Shortness Of Breath    Shortness of breath    Medications Scheduled Medications: . albuterol  2.5 mg Nebulization BID  . enoxaparin  120 mg Subcutaneous Daily  . furosemide  40 mg Intravenous Q12H  . insulin aspart  0-15 Units Subcutaneous TID WC  . insulin aspart  0-5 Units Subcutaneous QHS  . isosorbide mononitrate  30 mg Oral Daily  . levothyroxine  50 mcg Oral QAC breakfast  . metoprolol  50 mg Oral BID  . pantoprazole  40 mg Oral Daily  . simvastatin  20 mg Oral QHS  . sodium chloride  3 mL Intravenous Q12H  . sodium chloride  3 mL Intravenous Q12H  . terazosin  1 mg Oral QHS          PRN Medications:  albuterol   Past Medical History  Diagnosis Date  . Colon cancer     Status post partial colectomy  . Hypertension   . Hypercholesterolemia   . Blood transfusion   . Coronary artery disease 2011    Non obstructive CAD  . DVT (deep venous thrombosis)   . PE (pulmonary embolism)   . Diabetes mellitus without complication   . A-fib   . Diabetes mellitus, type II   . Ventral hernia   . Right bundle branch block   . Unspecified hypothyroidism   . LVH (left ventricular hypertrophy) 10/10/2012    Ejection fraction 60-65%.    Past Surgical History  Procedure Laterality Date  . Abdominal surgery      x 3  . Cardiac catheterization  2008    No family history on file.  Social History [name] reports that he has quit smoking. He does not have any smokeless tobacco history on file. [name] reports that he does not drink alcohol.  Review of Systems Otherwise reviewed and negative except as outlined.  Physical Examination Blood pressure 174/67, pulse 65, temperature 98.3 F (36.8 C), temperature source Oral, resp. rate 20, height 5\' 8"  (1.727 m), weight 196 lb 3.4 oz (89 kg), SpO2 96.00%.  HEENT: Conjunctiva and lids normal, oropharynx clear with moist mucosa. Neck: Supple, no  elevated JVP or carotid bruits, no thyromegaly. Lungs: Bibasilar crackles without wheezes or coughing Cardiac: Irregular, bradycardic,, no S3 or significant systolic murmur, no pericardial rub. Abdomen: Soft, nontender, mild distention, no hepatomegaly, bowel sounds present, no guarding or rebound. Extremities: 1+ pitting edema, cramping in the right calf, distal pulses 2+. Skin: Warm and dry. Musculoskeletal: No kyphosis. Neuropsychiatric: Alert and oriented x3, affect grossly appropriate.    Intake/Output Summary (Last 24 hours) at 11/11/12 1109 Last data filed at 11/11/12 0831  Gross per 24 hour  Intake    243 ml  Output   1450 ml  Net  -1207 ml      Prior Cardiac Testing/Procdures  1. Echocardiogram 10/10/2012 Left ventricle: Technically difficult study. Wall thickness was increased in a pattern of moderate LVH. Systolic function was normal. The estimated ejection fraction was in the range of 60% to 65%. Images were inadequate for LV wall motion assessment. The study is not technically sufficient to allow evaluation of LV diastolic function, due to underlying atrial fibrillation. - Ventricular septum: Septal motion consistent with an underlying conduction disturbance. - Aortic valve: Mildly thickened leaflets. - Left atrium: The atrium was mildly to moderately dilated.  2. Cardiac Cath 02/2006 1. Low normal left ventricular function with an ejection fraction of  50-55% with left ventricular hypertrophy and subtle focal apical  septal, apical inferior hypocontractility.  2. Mild to moderate coronary obstructive disease with 20% left main  ostial tapering, 20 and 30% proximal and  mid LAD stenoses, diffuse 50% narrowing in the circumflex marginal  vessel and 20% narrowing in the right coronary artery.   Lab Results  Basic Metabolic Panel:  Recent Labs Lab 11/10/12 2136 11/11/12 0309  NA 137 137  K 3.9 3.8  CL 102 100  CO2 30 27  GLUCOSE 115* 181*  BUN 15 16   CREATININE 1.12 1.07  CALCIUM 9.7 10.0    Liver Function Tests:  Recent Labs Lab 11/10/12 2136 11/11/12 0309  AST 49* 38*  ALT 56* 53  ALKPHOS 100 105  BILITOT 0.2* 0.3  PROT 6.6 7.0  ALBUMIN 3.7 4.0    CBC:  Recent Labs Lab 11/10/12 2136 11/11/12 0309  WBC 3.2* 3.3*  NEUTROABS 1.2*  --   HGB 10.2* 11.1*  HCT 31.8* 33.7*  MCV 99.4 97.1  PLT 146* 159    Cardiac Enzymes:  Recent Labs Lab 11/10/12 2136 11/11/12 0309 11/11/12 0844  TROPONINI <0.30 <0.30 <0.30    Radiology: Dg Chest Portable 1 View  11/10/2012   CLINICAL DATA:  Shortness of Breath.  EXAM: PORTABLE CHEST - 1 VIEW  COMPARISON:  CT chest 10/10/2012 and plain films of the chest 09/2012 and 12/24/2011.  FINDINGS: There is chronic scarring in the lung bases. Emphysematous disease is identified. No consolidative process, pneumothorax or effusion is seen. Heart size is normal. No focal bony abnormality.  IMPRESSION: No acute finding. Stable compared to prior exams.  Electronically Signed   By: Drusilla Kanner M.D.   On: 11/10/2012 21:40     ZOX:WRUEAV fib with slow ventricular response 54 bpm, with RBBB.   Impression and Recommendations  1. Acute on Chronic Diastolic CHF:  Likely related to dietary noncompliance, but also bradycardia could be playing a role. He is diuresing on IV lasix 40 mg IV BID but is not yet at baseline weight. Will continue IV for at least 24 more hours. Will add potassium due to cramping issues with follow up BMET in am. Will not repeat echo as one was just done one month ago.  2. Bradycardia: He denies dizziness or chest pain associated. He is not on AV nodal blocking agents. He has metoprolol listed on pre-admission home medications, although he was not sent home on this on last admission. TSH completed last month was WNL. He was not considered for pacemaker at that time. May revisit this if he is having significant pauses.   3. Atrial fibrillation: Slow ventricular response.  He remains on LMWH which he injects himself at home.   4. Hypertension: Blood pressure has been labile. Slightly elevated this am. Creatinine is 1.07. He was on isosorbide at home, which has not been restarted this admission.Can consider adding back or leaving off for nitrate holiday. Will add low dose ACE and watch kidney fx.        Signed: Bettey Mare. Lyman Bishop NP [facility] 11/11/2012, 11:09 AM Co-Sign MD

## 2012-11-11 NOTE — H&P (Signed)
[facility] History and Physical  Bryce Mcdonald JXB:[id number] DOB: 1921-09-09 DOA: 11/10/2012  Referring physician: Dr. Fonnie Jarvis , ER. PCP: No PCP Per Patient  Specialists: [facility].  Chief Complaint: Dyspnea.  HPI: Bryce Mcdonald is a 79 y.o. male who describes a history of dyspnea associated with cough productive of white sputum. There's been no fever. He notices that he gets short of breath on minimal exertion. This is a new symptom for him. He does have a history of chronic atrial fibrillation with slow ventricular response, hypertension, diet-controlled diabetes. He denies any chest pain at the present time. He also has a history of thromboembolic disease and is on chronic anticoagulation with Lovenox.   Review of Systems:.  Apart from history of present illness, other systems negative.  Past Medical History  Diagnosis Date  . Colon cancer     Status post partial colectomy  . Hypertension   . Hypercholesterolemia   . Blood transfusion   . Coronary artery disease 2011    Non obstructive CAD  . DVT (deep venous thrombosis)   . PE (pulmonary embolism)   . Diabetes mellitus without complication   . A-fib   . Diabetes mellitus, type II   . Ventral hernia   . Right bundle branch block   . Unspecified hypothyroidism   . LVH (left ventricular hypertrophy) 10/10/2012    Ejection fraction 60-65%.   Past Surgical History  Procedure Laterality Date  . Abdominal surgery      x 3  . Cardiac catheterization  2008   Social History:  reports that he has quit smoking. He does not have any smokeless tobacco history on file. He reports that he does not drink alcohol or use illicit drugs.   Allergies  Allergen Reactions  . Codeine Shortness Of Breath    Shortness of breath    No family history on file. noncontributory.   Prior to Admission medications   Medication Sig Start Date End Date Taking? Authorizing Provider  albuterol (PROVENTIL HFA) 108 (90 BASE)  MCG/ACT inhaler Inhale 2 puffs into the lungs every 6 (six) hours as needed for wheezing or shortness of breath.   Yes Historical Provider, MD  enoxaparin (LOVENOX) 120 MG/0.8ML injection Inject 0.8 mLs (120 mg total) into the skin daily. This is a dose increase from your previous dose. 10/11/12  Yes Elliot Cousin, MD  isosorbide mononitrate (IMDUR) 30 MG 24 hr tablet Take 1 tablet (30 mg total) by mouth daily. Long acting nitroglycerin medicine for your heart. 10/11/12  Yes Elliot Cousin, MD  levothyroxine (SYNTHROID, LEVOTHROID) 50 MCG tablet Take 50 mcg by mouth daily before breakfast.   Yes Historical Provider, MD  metoprolol (LOPRESSOR) 50 MG tablet Take 50 mg by mouth 2 (two) times daily.   Yes Historical Provider, MD  omeprazole (PRILOSEC) 40 MG capsule Take 1 capsule (40 mg total) by mouth daily. For treatment of acid indigestion/reflux. 10/11/12  Yes Elliot Cousin, MD  simvastatin (ZOCOR) 40 MG tablet Take 20 mg by mouth at bedtime.     Yes Historical Provider, MD  terazosin (HYTRIN) 1 MG capsule Take 1 mg by mouth at bedtime.   Yes Historical Provider, MD   Physical Exam: Filed Vitals:   11/11/12 0224  BP: 127/56  Pulse: 68  Temp: 97.9 F (36.6 C)  Resp: 20     General:  He looks systemically well. He does not appear to be dyspneic at rest. There is no peripheral or central cyanosis.  Eyes: No pallor. No  jaundice.  ENT: No abnormalities.  Neck: No lymphadenopathy.  Cardiovascular: Heart sounds are present and in atrial fibrillation clinically. There is peripheral pitting edema in his ankle area. Jugular venous pressure does not appear to be elevated.  Respiratory: Lung fields show bilateral inspiratory crackles at the bases.  Abdomen: Soft, nontender.  Skin: No rash.  Musculoskeletal: No acute joint abnormalities.  Psychiatric: Appropriate affect.  Neurologic: Alert and orientated without focal neurological signs.  Labs on Admission:  Basic Metabolic Panel:  Recent  Labs Lab 11/10/12 2136  NA 137  K 3.9  CL 102  CO2 30  GLUCOSE 115*  BUN 15  CREATININE 1.12  CALCIUM 9.7   Liver Function Tests:  Recent Labs Lab 11/10/12 2136  AST 49*  ALT 56*  ALKPHOS 100  BILITOT 0.2*  PROT 6.6  ALBUMIN 3.7     CBC:  Recent Labs Lab 11/10/12 2136  WBC 3.2*  NEUTROABS 1.2*  HGB 10.2*  HCT 31.8*  MCV 99.4  PLT 146*   Cardiac Enzymes:  Recent Labs Lab 11/10/12 2136  TROPONINI <0.30    BNP (last 3 results)  Recent Labs  09/29/12 2348 11/10/12 2136  PROBNP 2067.0* 1765.0*      Radiological Exams on Admission: Dg Chest Portable 1 View  11/10/2012   CLINICAL DATA:  Shortness of Breath.  EXAM: PORTABLE CHEST - 1 VIEW  COMPARISON:  CT chest 10/10/2012 and plain films of the chest 09/2012 and 12/24/2011.  FINDINGS: There is chronic scarring in the lung bases. Emphysematous disease is identified. No consolidative process, pneumothorax or effusion is seen. Heart size is normal. No focal bony abnormality.  IMPRESSION: No acute finding. Stable compared to prior exams.   Electronically Signed   By: Drusilla Kanner M.D.   On: 11/10/2012 21:40    EKG: Independently reviewed. Atrial fibrillation, slow ventricular response. No acute ST-T wave changes.  Assessment/Plan   1. Congestive heart failure, probably diastolic. History of left ventricular hypertrophy. 2. Chronic atrial fibrillation. 3. Hypertension. 4. Type 2 diabetes mellitus, diet controlled. 5. 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  Plan: 1. Admit to telemetry. 2. Intravenous diuretics. 3. Daily weights and monitor urine output. 4. Cardiology consultation.  Further recommendations will depend on patient's hospital progress.   Code Status: Full code.   Family Communication: Discussed plan with patient at the bedside.   Disposition Plan: Home when medically stable.   Time spent: 60 minutes.  Wilson Singer [facility] Pager [phone].  If 7PM-7AM, please  contact night-coverage [email or web address] Password [facility] 11/11/2012, 3:03 AM

## 2012-11-11 NOTE — Care Management Note (Addendum)
    Page 1 of 2   11/13/2012     2:46:40 PM   CARE MANAGEMENT NOTE 11/13/2012  Patient:  Bryce Mcdonald, Bryce Mcdonald   Account Number:  [id number]  Date Initiated:  11/11/2012  Documentation initiated by:  Rosemary Holms  Subjective/Objective Assessment:   Pt admitted from home where he lives with a child. Pt is a [employer] insured. Consent to transfered signed and sent to [facility]. Pt elected to transfer although he feels sure they will not transfer him.     Action/Plan:   Anticipated DC Date:  11/13/2012   Anticipated DC Plan:  HOME W HOME HEALTH SERVICES      DC Planning Services  CM consult      Choice offered to / List presented to:     DME arranged  [facility]      DME agency  [facility]     HH arranged  HH-1 RN      [facility] agency  [facility]   Status of service:  Completed, signed off Medicare Important Message given?  YES (If response is "NO", the following Medicare IM given date fields will be blank) Date Medicare IM given:  11/13/2012 Date Additional Medicare IM given:    Discharge Disposition:  HOME W HOME HEALTH SERVICES  Per UR Regulation:    If discussed at Long Length of Stay Meetings, dates discussed:    Comments:  11/13/12 Rosemary Holms RN BSN CM [facility] Resp[facility] requested specfic  information for pt to qualify for O2 at home. PT evaluated pt's per [facility] requirements. MD notified. Addressed in DC summary as requested. Faxed to [place] at the [place] to set up home O2. Joelene Millin aware of DC today. 1430, Per Eugenia Mcalpine will deliver O2 canister to the hospital and contact his son to arrange for O2 to be set up at home. RN Arline Asp aware   11/12/12 Raelea Gosse RobsonRN BSN CM Cm contacting [facility] PCP Dr. Dyke Brackett and associate Starlyn Skeans. [phone]  11/11/12 Rosemary Holms RN BNS CM

## 2012-11-11 NOTE — Telephone Encounter (Signed)
Pt is in hospital.

## 2012-11-11 NOTE — Progress Notes (Signed)
  79 year old man with multiple medical problems including:   DVT, PE chronic atrial fibrillation coronary artery disease diabetes mellitus Chronic generalized weakness  presented with shortness of breath. Admitted for suspected diastolic congestive heart failure with diuresis and cardiology consultation.  Hypoxic in the emergency department and was referred for admission   Today: he feels better, no complaints except some leg cramps. Breathing better. No leg pain.  Afebrile, vital signs stable. Mild hypoxia. Exam is benign. Regular rate and rhythm. No murmur, rub, gallop. Respiratory clear to auscultation bilaterally.  Troponins negative thus far. Complete metabolic panel unremarkable. Mild leukopenia appears stable.  Continue treatment for acute on chronic diastolic heart failure as recommended by cardiology. Thought to be related to dietary noncompliance.  No beta blocker (due to bradycardia and documented long-standing conduction system disease-RBBB and LAFB) on discharge.

## 2012-11-11 NOTE — ED Notes (Signed)
Patient's oxygen saturation while resting is 90% on room air. Patient ambulated in hallway without wearing oxygen, oxygen saturation dropped to 85%. Dr. Fonnie Jarvis notified.

## 2012-11-11 NOTE — Progress Notes (Signed)
UR Chart Review Completed  

## 2012-11-11 NOTE — Consult Note (Signed)
The patient was seen and examined, and I agree with the assessment and plan as documented above with some modifications. Pt admitted with acute on chronic diastolic heart failure, likely due to dietary noncompliance, as he admits to eating a lot of deep fried, salty foods. This has also led to accelerated HTN. When I saw him in consultation during his last hospitalization, I recommended he be on HCTZ 25 mg daily, no beta blocker (due to bradycardia and documented long-standing conduction system disease-RBBB and LAFB), and increasing his lisinopril as needed (had been on 10 mg daily in combination with HCTZ). I will reduce his current dose of metoprolol to 12.5 mg bid, and this can be discontinued if he continues to experience bradycardia. I will also increase his lisinopril to 10 mg daily. Continue IV Lasix 40 mg bid for now.

## 2012-11-11 NOTE — Telephone Encounter (Signed)
Pt was a no show

## 2012-11-11 NOTE — Plan of Care (Signed)
Problem: Phase I Progression Outcomes Goal: Up in chair, BRP Outcome: Progressing 11/11/12 1245 Pt assisted up to chair with nurse tech this morning. States comfortable. Call light within reach, instructed to call as needed and not attempt getting up on his own. States will call. Family at bedside this afternoon. Devin Going, RN

## 2012-11-11 NOTE — Progress Notes (Signed)
Bryce Mcdonald is a 79 y.o. male who describes a history of dyspnea associated with cough productive of white sputum so he went to ED.  There's been no fever. He notices that he gets short of breath on minimal exertion. This is a new symptom for him. He does have a history of chronic atrial fibrillation with slow ventricular response, hypertension, diet-controlled diabetes.  He also has a history of thromboembolic disease and is on chronic anticoagulation with Lovenox  In the ED he received a breathing treatment, lasix IV 40mg  and solumedrol. Chest xray yields no acute findings. EKG yields Afib with slow ventricular response and RBBB. No acute change from last month. ProBNP level 1765.0.  He was admitted for diuresis. This am he feels much improved. He is sitting in chair with normal respiratory effort. HS are irregular without murmur. Fine crackles in bases. No wheeze   Assessment/plan  1. Congestive heart failure, probably diastolic. Acute on chronic. Pt reports difficulty adhering to diet.  Volume status -1.7L. Fair urine output. Pt reports improvement in breathing and LE edema.  Will continue IV lasix per cards. Appreciate cardiology assistance. Continue daily weight and strict intake and output. History of left ventricular hypertrophy. 2. Chronic atrial fibrillation:with slow ventricular response. Pt on lovenox at home and this is continued.  3. Hypertension. Only fair control. SBP range 109-174. Low dose ACE started per cards. Home isosorbide on hold for now 4. Type 2 diabetes mellitus, diet controlled. A1c 6.7. CBG range 159-166. Appetite improved. Will consider adding low dose meal coverage.  5. 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 Leukopenia: chart review indicates chronic. Current level appears at baseline 7. Anemia: likely of chronic disease. No s/sx bleeding. Stable at baseline. Will monitor.

## 2012-11-12 LAB — BASIC METABOLIC PANEL
BUN: 23 mg/dL (ref 6–23)
CO2: 32 mEq/L (ref 19–32)
Calcium: 9.8 mg/dL (ref 8.4–10.5)
Chloride: 101 mEq/L (ref 96–112)
Creatinine, Ser: 1.31 mg/dL (ref 0.50–1.35)
GFR calc Af Amer: 54 mL/min — ABNORMAL LOW (ref >90)
Sodium: 141 mEq/L (ref 135–145)

## 2012-11-12 LAB — CBC
HCT: 31.4 % — ABNORMAL LOW (ref 39.0–52.0)
Hemoglobin: 10.4 g/dL — ABNORMAL LOW (ref 13.0–17.0)
MCHC: 33.1 g/dL (ref 30.0–36.0)
MCV: 97.2 fL (ref 78.0–100.0)
Platelets: 151 10*3/uL (ref 150–400)
RDW: 14.5 % (ref 11.5–15.5)

## 2012-11-12 LAB — GLUCOSE, CAPILLARY
Glucose-Capillary: 103 mg/dL — ABNORMAL HIGH (ref 70–99)
Glucose-Capillary: 114 mg/dL — ABNORMAL HIGH (ref 70–99)

## 2012-11-12 MED ORDER — ACETAMINOPHEN 325 MG PO TABS
650.0000 mg | ORAL_TABLET | Freq: Four times a day (QID) | ORAL | Status: DC | PRN
  Administered 2012-11-12: 650 mg via ORAL
  Filled 2012-11-12: qty 2

## 2012-11-12 MED ORDER — FUROSEMIDE 40 MG PO TABS
40.0000 mg | ORAL_TABLET | Freq: Two times a day (BID) | ORAL | Status: DC
  Administered 2012-11-12 – 2012-11-14 (×3): 40 mg via ORAL
  Filled 2012-11-12 (×3): qty 1

## 2012-11-12 NOTE — Progress Notes (Signed)
The patient was seen and examined, and I agree with the assessment and plan as documented above. Pt has improved considerably, with some residual fluid at lung bases. Agree with switching to oral diuretics today (currently on Lasix and not on HCTZ). Larger issue is dietary compliance to prevent readmissions. BP better controlled with lisinopril 10 mg daily. HR stable on metoprolol 12.5 mg bid.

## 2012-11-12 NOTE — Evaluation (Signed)
Physical Therapy Evaluation Patient Details Name: Bryce Mcdonald MRN: [id number] DOB: 1922-01-28 Today's Date: 11/12/2012 Time: 4098-1191 PT Time Calculation (min): 33 min  PT Assessment / Plan / Recommendation History of Present Illness   Pt is a 79 yo male admitted with CHF.  Pt lives alone he normally uses a 4-wheeled walker and does not use O2.  Clinical Impression  Pt is I in bed mobility.  He demonstrates safety with ambulation with walker but wears out quickly, (after 30 ft.).  Therapist spoke to pt about setting his kitchen timer on two minutes three times a day and walk in his apartment without stopping.  Once two minutes becomes easy increase to three and so on.  Pt will be seen one more time to reemphasis the need for him to do this so that he remains safe at home.    PT Assessment  Patient needs continued PT services    Follow Up Recommendations  No PT follow up    Does the patient have the potential to tolerate intense rehabilitation    no  Barriers to Discharge  none      Equipment Recommendations   none    Recommendations for Other Services   none  Frequency Min 3X/week    Precautions / Restrictions Precautions Precautions: None Restrictions Weight Bearing Restrictions: No   Pertinent Vitals/Pain 0/10     Mobility  Bed Mobility Bed Mobility: Supine to Sit;Sit to Supine Supine to Sit: 7: Independent Sit to Supine: 7: Independent Transfers Transfers: Sit to Stand Sit to Stand: 7: Independent Ambulation/Gait Ambulation/Gait Assistance: 6: Modified independent (Device/Increase time) Ambulation Distance (Feet): 30 Feet Assistive device: Rolling walker Gait Pattern: Trunk flexed Gait velocity: normal    Exercises  none   PT Diagnosis:  decreased activity tolerance.  PT Problem List: Decreased activity tolerance PT Treatment Interventions: Gait training;Therapeutic activities     PT Goals(Current goals can be found in the care plan  section) Acute Rehab PT Goals PT Goal Formulation: With patient Time For Goal Achievement: 11/15/12 Potential to Achieve Goals: Good  Visit Information  Last PT Received On: 11/12/12       Prior Functioning  Home Living Family/patient expects to be discharged to:: Private residence Living Arrangements: Alone Available Help at Discharge: Family;Friend(s) Type of Home: Apartment Home Access: Level entry Home Layout: One level Home Equipment: Environmental consultant - 4 wheels;Shower seat Prior Function Level of Independence: Independent with assistive device(s) Communication Communication: No difficulties Dominant Hand: Right    Cognition  Cognition Arousal/Alertness: Awake/alert Overall Cognitive Status: Within Functional Limits for tasks assessed    Extremity/Trunk Assessment Upper Extremity Assessment Upper Extremity Assessment: Defer to OT evaluation Lower Extremity Assessment Lower Extremity Assessment: Overall WFL for tasks assessed   Balance    End of Session PT - End of Session Equipment Utilized During Treatment: Gait belt Activity Tolerance: Patient tolerated treatment well Patient left: in bed  GP     Raciel Caffrey,CINDY 11/12/2012, 3:13 PM

## 2012-11-12 NOTE — Progress Notes (Signed)
Patient seen, independently examined and chart reviewed. I agree with exam, assessment and plan discussed with Toya Smothers, NP.  Feeling better today. Breathing fine. No new issues.  He appears well. Lying down without shortness of breath. Lungs generally clear to auscultation. Urine output remains excellent. Down 4 L. Creatinine slightly increased.troponin is negative and CBC stable.  Overall he continues to improve. Continue to treat his heart failure, change to oral Lasix as per cardiology. Would anticipate discharge on 9/24  Brendia Sacks, MD [facility] [phone]

## 2012-11-12 NOTE — Progress Notes (Signed)
[facility] PROGRESS NOTE  Bryce Mcdonald OZH:[id number] DOB: 01-21-1922 DOA: 11/10/2012 PCP: No PCP Per Patient  Assessment/Plan: Congestive heart failure, probably diastolic. Acute on chronic. Likely related to dietary non-compliance. Continues to improve. Volume status -4.2L. Good urine output. IV lasix transitioned to po per cards. Appreciate cardiology assistance. Weight 81.3kg down from 89kg. output. History of left ventricular hypertrophy. Recent echo 8/14 yields EF 60%.    Chronic atrial fibrillation:with slow ventricular response. Pt on lovenox at home and this is continued.   Hypertension. Fair control. SBP range 109-115. Low dose ACE started per cards. Home isosorbide resumed. Low dose BB as well.    Type 2 diabetes mellitus, diet controlled. A1c 6.7. CBG range 100-126.    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 Current level appears at baseline   Anemia: likely of chronic disease. No s/sx bleeding. Stable at baseline.   Code Status: full Family Communication: none present Disposition Plan: home with [facility] when ready hopefully tomorrow   Consultants:  cardiology  Procedures:  none  Antibiotics:  none  HPI/Subjective: Sitting up eating breakfast. Reports feeling "fine" and wanting to go home. Denies pain/discomfort  Objective: Filed Vitals:   11/12/12 1013  BP: 115/72  Pulse: 80  Temp:   Resp:     Intake/Output Summary (Last 24 hours) at 11/12/12 1418 Last data filed at 11/12/12 1300  Gross per 24 hour  Intake    600 ml  Output   2975 ml  Net  -2375 ml   Filed Weights   11/10/12 2112 11/11/12 0304 11/12/12 0316  Weight: 81.647 kg (180 lb) 89 kg (196 lb 3.4 oz) 81.3 kg (179 lb 3.7 oz)    Exam:   General:  Well nourished NAD  Cardiovascular: RRR no gallup no rub. Trace LE edema  Respiratory: normal effort. Fine crackles bases, otherwise BS are clear. No wheeze  Abdomen: soft +BS in all 4  quadrants. Non-tender to palpation  Musculoskeletal: no clubbing no cyanosis   Data Reviewed: Basic Metabolic Panel:  Recent Labs Lab 11/10/12 2136 11/11/12 0309 11/12/12 0555  NA 137 137 141  K 3.9 3.8 3.6  CL 102 100 101  CO2 30 27 32  GLUCOSE 115* 181* 108*  BUN 15 16 23   CREATININE 1.12 1.07 1.31  CALCIUM 9.7 10.0 9.8   Liver Function Tests:  Recent Labs Lab 11/10/12 2136 11/11/12 0309  AST 49* 38*  ALT 56* 53  ALKPHOS 100 105  BILITOT 0.2* 0.3  PROT 6.6 7.0  ALBUMIN 3.7 4.0   No results found for this basename: LIPASE, AMYLASE,  in the last 168 hours No results found for this basename: AMMONIA,  in the last 168 hours CBC:  Recent Labs Lab 11/10/12 2136 11/11/12 0309 11/12/12 0555  WBC 3.2* 3.3* 3.7*  NEUTROABS 1.2*  --   --   HGB 10.2* 11.1* 10.4*  HCT 31.8* 33.7* 31.4*  MCV 99.4 97.1 97.2  PLT 146* 159 151   Cardiac Enzymes:  Recent Labs Lab 11/10/12 2136 11/11/12 0309 11/11/12 0844 11/11/12 1459  TROPONINI <0.30 <0.30 <0.30 <0.30   BNP (last 3 results)  Recent Labs  09/29/12 2348 11/10/12 2136  PROBNP 2067.0* 1765.0*   CBG:  Recent Labs Lab 11/11/12 1117 11/11/12 1623 11/11/12 2059 11/12/12 0740 11/12/12 1138  GLUCAP 166* 121* 126* 100* 113*    No results found for this or any previous visit (from the past 240 hour(s)).   Studies: Dg Chest Portable 1 View  11/10/2012   CLINICAL DATA:  Shortness of Breath.  EXAM: PORTABLE CHEST - 1 VIEW  COMPARISON:  CT chest 10/10/2012 and plain films of the chest 09/2012 and 12/24/2011.  FINDINGS: There is chronic scarring in the lung bases. Emphysematous disease is identified. No consolidative process, pneumothorax or effusion is seen. Heart size is normal. No focal bony abnormality.  IMPRESSION: No acute finding. Stable compared to prior exams.   Electronically Signed   By: Drusilla Kanner M.D.   On: 11/10/2012 21:40    Scheduled Meds: . albuterol  2.5 mg Nebulization BID  .  enoxaparin  120 mg Subcutaneous Daily  . furosemide  40 mg Oral BID  . insulin aspart  0-15 Units Subcutaneous TID WC  . insulin aspart  0-5 Units Subcutaneous QHS  . isosorbide mononitrate  30 mg Oral Daily  . levothyroxine  50 mcg Oral QAC breakfast  . lisinopril  10 mg Oral Daily  . metoprolol  12.5 mg Oral BID  . pantoprazole  40 mg Oral Daily  . potassium chloride  20 mEq Oral BID  . simvastatin  20 mg Oral QHS  . sodium chloride  3 mL Intravenous Q12H  . sodium chloride  3 mL Intravenous Q12H  . terazosin  1 mg Oral QHS   Continuous Infusions:   Principal Problem:   Heart failure Active Problems:   HTN (hypertension)   Atrial fibrillation with slow ventricular response   Chronic anticoagulation   Type 2 diabetes mellitus   DVT, bilateral lower limbs   LVH (left ventricular hypertrophy)    Time spent: 30 minutes    [facility] M  [facility] Pager [phone]. If 7PM-7AM, please contact night-coverage at [email or web address], password [facility] 11/12/2012, 2:18 PM  LOS: 2 days

## 2012-11-12 NOTE — Progress Notes (Signed)
[facility] ROUNDING NOTE  Consulting cardiologist: Purvis Sheffield, MD  Subjective:    Breathing better.Wants to go home.  Objective:   Temp:  [97.4 F (36.3 C)-98.4 F (36.9 C)] 98.4 F (36.9 C) (09/23 0601) Pulse Rate:  [56-80] 80 (09/23 1013) Resp:  [20] 20 (09/23 0601) BP: (108-116)/(34-72) 115/72 mmHg (09/23 1013) SpO2:  [96 %-100 %] 99 % (09/23 1013) Weight:  [179 lb 3.7 oz (81.3 kg)] 179 lb 3.7 oz (81.3 kg) (09/23 0316) Last BM Date: 11/11/12  Filed Weights   11/10/12 2112 11/11/12 0304 11/12/12 0316  Weight: 180 lb (81.647 kg) 196 lb 3.4 oz (89 kg) 179 lb 3.7 oz (81.3 kg)    Intake/Output Summary (Last 24 hours) at 11/12/12 1124 Last data filed at 11/12/12 1121  Gross per 24 hour  Intake    480 ml  Output   2550 ml  Net  -2070 ml    Telemetry: AFib with RBBB, rates in the 70's.   Exam:  General: No acute distress.  HEENT: Conjunctiva and lids normal, oropharynx clear.  Lungs: Crackles in the bases, some clearing with cough, but lateral bases continue to have rales.  Cardiac: No elevated JVP or bruits. IRR, 1/6 systolic murmur.  Abdomen: Normoactive bowel sounds, nontender, nondistended.  Extremities: Non pitting pretibial edema is noted, distal pulses full.  Neuropsychiatric: Alert and oriented x3, affect appropriate. Very [facility]   Lab Results:  Basic Metabolic Panel:  Recent Labs Lab 11/10/12 2136 11/11/12 0309 11/12/12 0555  NA 137 137 141  K 3.9 3.8 3.6  CL 102 100 101  CO2 30 27 32  GLUCOSE 115* 181* 108*  BUN 15 16 23   CREATININE 1.12 1.07 1.31  CALCIUM 9.7 10.0 9.8    Liver Function Tests:  Recent Labs Lab 11/10/12 2136 11/11/12 0309  AST 49* 38*  ALT 56* 53  ALKPHOS 100 105  BILITOT 0.2* 0.3  PROT 6.6 7.0  ALBUMIN 3.7 4.0    CBC:  Recent Labs Lab 11/10/12 2136 11/11/12 0309 11/12/12 0555  WBC 3.2* 3.3* 3.7*  HGB 10.2* 11.1* 10.4*  HCT 31.8* 33.7* 31.4*  MCV 99.4 97.1 97.2  PLT 146* 159 151    Cardiac  Enzymes:  Recent Labs Lab 11/11/12 0309 11/11/12 0844 11/11/12 1459  TROPONINI <0.30 <0.30 <0.30    BNP:  Recent Labs  09/29/12 2348 11/10/12 2136  PROBNP 2067.0* 1765.0*    Coagulation:  Recent Labs Lab 11/10/12 2136  INR 1.07    Radiology: Dg Chest Portable 1 View  11/10/2012   CLINICAL DATA:  Shortness of Breath.  EXAM: PORTABLE CHEST - 1 VIEW  COMPARISON:  CT chest 10/10/2012 and plain films of the chest 09/2012 and 12/24/2011.  FINDINGS: There is chronic scarring in the lung bases. Emphysematous disease is identified. No consolidative process, pneumothorax or effusion is seen. Heart size is normal. No focal bony abnormality.  IMPRESSION: No acute finding. Stable compared to prior exams.   Electronically Signed   By: Drusilla Kanner M.D.   On: 11/10/2012 21:40     ECG:   Medications:   Scheduled Medications: . albuterol  2.5 mg Nebulization BID  . enoxaparin  120 mg Subcutaneous Daily  . furosemide  40 mg Intravenous Q12H  . insulin aspart  0-15 Units Subcutaneous TID WC  . insulin aspart  0-5 Units Subcutaneous QHS  . isosorbide mononitrate  30 mg Oral Daily  . levothyroxine  50 mcg Oral QAC breakfast  . lisinopril  10 mg Oral Daily  .  metoprolol  12.5 mg Oral BID  . pantoprazole  40 mg Oral Daily  . potassium chloride  20 mEq Oral BID  . simvastatin  20 mg Oral QHS  . sodium chloride  3 mL Intravenous Q12H  . sodium chloride  3 mL Intravenous Q12H  . terazosin  1 mg Oral QHS       PRN Medications:  albuterol   Assessment and Plan:   1. Acute on Chronic Diastolic CHF: He is diuresing but not quite euvolemic yet. Still with rales in the bases, and non-pitting edema in the LE. Creatinine 1.31 this am. Weight is down 15 lbs from highest recorded. He is breathing bettter. Will transition to PO lasix after am dose. Change to 40 mg BID. Potassium 3.8 this am. Will need to be stricter on salt restriction.  2. Atrial fib with slow ventricular rate:  Heart rate is better now on low dose metoprolol 12.5 mg BID without evidence of pauses or significant bradycardia. Continues on LMWH from [place] which he self administers.   3. Hypertension: Blood pressure is stable with addition of HCTZ and increased dose of lisinopril. Need to consider HHN for ongoing assessment of BP and fluid status on discharge.   Bettey Mare. Lyman Bishop NP [facility] 11/12/2012, 11:24 AM

## 2012-11-13 DIAGNOSIS — E785 Hyperlipidemia, unspecified: Secondary | ICD-10-CM

## 2012-11-13 LAB — GLUCOSE, CAPILLARY
Glucose-Capillary: 109 mg/dL — ABNORMAL HIGH (ref 70–99)
Glucose-Capillary: 105 mg/dL — ABNORMAL HIGH (ref 70–99)
Glucose-Capillary: 116 mg/dL — ABNORMAL HIGH (ref 70–99)

## 2012-11-13 MED ORDER — POTASSIUM CHLORIDE CRYS ER 20 MEQ PO TBCR: 20.0000 meq | EXTENDED_RELEASE_TABLET | Freq: Two times a day (BID) | ORAL | Status: DC

## 2012-11-13 MED ORDER — LISINOPRIL 10 MG PO TABS: 10.0000 mg | ORAL_TABLET | Freq: Every day | ORAL | Status: DC

## 2012-11-13 MED ORDER — FUROSEMIDE 40 MG PO TABS: 40.0000 mg | ORAL_TABLET | Freq: Every day | ORAL | Status: DC

## 2012-11-13 MED ORDER — FUROSEMIDE 40 MG PO TABS: 40.0000 mg | ORAL_TABLET | Freq: Two times a day (BID) | ORAL | Status: DC

## 2012-11-13 MED ORDER — METOPROLOL TARTRATE 12.5 MG HALF TABLET: 12.5000 mg | ORAL_TABLET | Freq: Two times a day (BID) | ORAL | Status: DC

## 2012-11-13 NOTE — Progress Notes (Signed)
SUBJECTIVE: Pt denies chest pain, SOB, and leg swelling. He is eager to go home.     Intake/Output Summary (Last 24 hours) at 11/13/12 1137 Last data filed at 11/13/12 0214  Gross per 24 hour  Intake    480 ml  Output   1275 ml  Net   -795 ml    Current Facility-Administered Medications  Medication Dose Route Frequency Provider Last Rate Last Dose  . acetaminophen (TYLENOL) tablet 650 mg  650 mg Oral Q6H PRN Wilson Singer, MD   650 mg at 11/12/12 2343  . albuterol (PROVENTIL HFA;VENTOLIN HFA) 108 (90 BASE) MCG/ACT inhaler 2 puff  2 puff Inhalation Q6H PRN Nimish C Gosrani, MD      . albuterol (PROVENTIL) (5 MG/ML) 0.5% nebulizer solution 2.5 mg  2.5 mg Nebulization BID Standley Brooking, MD   2.5 mg at 11/13/12 1610  . enoxaparin (LOVENOX) injection 120 mg  120 mg Subcutaneous Daily Nimish C Gosrani, MD   120 mg at 11/13/12 1114  . furosemide (LASIX) tablet 40 mg  40 mg Oral BID Gwenyth Bender, NP   40 mg at 11/13/12 0913  . insulin aspart (novoLOG) injection 0-15 Units  0-15 Units Subcutaneous TID WC Wilson Singer, MD   2 Units at 11/11/12 1708  . insulin aspart (novoLOG) injection 0-5 Units  0-5 Units Subcutaneous QHS Nimish C Gosrani, MD      . isosorbide mononitrate (IMDUR) 24 hr tablet 30 mg  30 mg Oral Daily Nimish C Karilyn Cota, MD   30 mg at 11/13/12 1115  . levothyroxine (SYNTHROID, LEVOTHROID) tablet 50 mcg  50 mcg Oral QAC breakfast Wilson Singer, MD   50 mcg at 11/13/12 0913  . lisinopril (PRINIVIL,ZESTRIL) tablet 10 mg  10 mg Oral Daily Laqueta Linden, MD   10 mg at 11/13/12 1114  . metoprolol tartrate (LOPRESSOR) tablet 12.5 mg  12.5 mg Oral BID Laqueta Linden, MD   12.5 mg at 11/13/12 1116  . pantoprazole (PROTONIX) EC tablet 40 mg  40 mg Oral Daily Nimish C Karilyn Cota, MD   40 mg at 11/13/12 1116  . potassium chloride SA (K-DUR,KLOR-CON) CR tablet 20 mEq  20 mEq Oral BID Jodelle Gross, NP   20 mEq at 11/13/12 1117  . simvastatin (ZOCOR) tablet 20 mg   20 mg Oral QHS Wilson Singer, MD   20 mg at 11/12/12 2123  . sodium chloride 0.9 % injection 3 mL  3 mL Intravenous Q12H Nimish C Gosrani, MD      . sodium chloride 0.9 % injection 3 mL  3 mL Intravenous Q12H Nimish C Gosrani, MD   3 mL at 11/12/12 1019  . terazosin (HYTRIN) capsule 1 mg  1 mg Oral QHS Nimish C Gosrani, MD   1 mg at 11/12/12 2123    Filed Vitals:   11/12/12 2117 11/13/12 0509 11/13/12 0532 11/13/12 0730  BP: 92/76  114/59   Pulse: 101  61   Temp: 97.8 F (36.6 C)  97.8 F (36.6 C)   TempSrc: Oral  Oral   Resp: 16  18   Height:      Weight:  177 lb 11.1 oz (80.6 kg)    SpO2: 99%  100% 98%    PHYSICAL EXAM General: NAD Neck: No JVD, no thyromegaly or thyroid nodule.  Lungs: Clear to auscultation bilaterally, slightly diminished at bases (no rales). CV: Nondisplaced PMI.  Heart irregular rhythm, HR 70's,  S1/S2, soft  I/VI systolic murmur.  No peripheral edema.   Abdomen: Soft, nontender, no hepatosplenomegaly, no distention.  Neurologic: Alert and oriented x 3.  Psych: Normal affect. Extremities: No clubbing or cyanosis.   TELEMETRY: Reviewed telemetry pt in atrial fibrillation, HR 70's.  LABS: Basic Metabolic Panel:  Recent Labs  96/04/54 0309 11/12/12 0555  NA 137 141  K 3.8 3.6  CL 100 101  CO2 27 32  GLUCOSE 181* 108*  BUN 16 23  CREATININE 1.07 1.31  CALCIUM 10.0 9.8   Liver Function Tests:  Recent Labs  11/10/12 2136 11/11/12 0309  AST 49* 38*  ALT 56* 53  ALKPHOS 100 105  BILITOT 0.2* 0.3  PROT 6.6 7.0  ALBUMIN 3.7 4.0   No results found for this basename: LIPASE, AMYLASE,  in the last 72 hours CBC:  Recent Labs  11/10/12 2136 11/11/12 0309 11/12/12 0555  WBC 3.2* 3.3* 3.7*  NEUTROABS 1.2*  --   --   HGB 10.2* 11.1* 10.4*  HCT 31.8* 33.7* 31.4*  MCV 99.4 97.1 97.2  PLT 146* 159 151   Cardiac Enzymes:  Recent Labs  11/11/12 0309 11/11/12 0844 11/11/12 1459  TROPONINI <0.30 <0.30 <0.30   BNP: No components  found with this basename: POCBNP,  D-Dimer: No results found for this basename: DDIMER,  in the last 72 hours Hemoglobin A1C: No results found for this basename: HGBA1C,  in the last 72 hours Fasting Lipid Panel: No results found for this basename: CHOL, HDL, LDLCALC, TRIG, CHOLHDL, LDLDIRECT,  in the last 72 hours Thyroid Function Tests:  Recent Labs  11/11/12 0309  TSH 1.229   Anemia Panel: No results found for this basename: VITAMINB12, FOLATE, FERRITIN, TIBC, IRON, RETICCTPCT,  in the last 72 hours  RADIOLOGY: Dg Chest Portable 1 View  11/10/2012   CLINICAL DATA:  Shortness of Breath.  EXAM: PORTABLE CHEST - 1 VIEW  COMPARISON:  CT chest 10/10/2012 and plain films of the chest 09/2012 and 12/24/2011.  FINDINGS: There is chronic scarring in the lung bases. Emphysematous disease is identified. No consolidative process, pneumothorax or effusion is seen. Heart size is normal. No focal bony abnormality.  IMPRESSION: No acute finding. Stable compared to prior exams.   Electronically Signed   By: Drusilla Kanner M.D.   On: 11/10/2012 21:40      ASSESSMENT AND PLAN: 1. Acute on Chronic Diastolic CHF: he appears to be compensated at this time. I would discharge him on Lasix 40 mg daily. His BP is well controlled on lisinopril 10 mg daily. Larger issue is dietary compliance to prevent readmissions. Continue Imdur 30 mg daily for adequate venodilation.  2. Atrial fibrillation: Heart rate is controlled on low-dose metoprolol 12.5 mg BID without evidence of pauses or significant bradycardia. Continues on LMWH from [place] which he self administers. HR needs to be monitored as outpatient, as he's had a tendency for bradycardia in the past.  3. Hypertension: Blood pressure is stable on lisinopril 10 mg daily. Need to consider HHN for ongoing assessment of BP/HR and fluid status on discharge.       Prentice Docker, M.D., F.A.C.C.

## 2012-11-13 NOTE — Progress Notes (Signed)
Physical Therapy Treatment Patient Details Name: Bryce Mcdonald MRN: [id number] DOB: 1921-07-19 Today's Date: 11/13/2012 Time: 7829-5621 PT Time Calculation (min): 35 min  PT Assessment / Plan / Recommendation  History of Present Illness I was asked to verify O2 saturation levels prior to pt's d/c to home.  This was done earlier today with inconclusive results due to difficulty getting a good reading on the equipment used.  [facility] by the [facility] were used.   PT Comments   Resting O2 sat on 2 L O2=98%. Off of O2 for 15 minutes, O2 sat=94% Ambulating 100' on RA, O2 sat=87% O2 at 2L/min added, pt ambulated another 100'..oxygen sat=93% O2 was left on pt at 2 L/min  Follow Up Recommendations        Does the patient have the potential to tolerate intense rehabilitation     Barriers to Discharge        Equipment Recommendations  None recommended by PT    Recommendations for Other Services    Frequency     Progress towards PT Goals Progress towards PT goals: Goals met/education completed, patient discharged from PT  Plan Current plan remains appropriate    Precautions / Restrictions     Pertinent Vitals/Pain     Mobility  Bed Mobility Supine to Sit: 7: Independent;HOB flat Transfers Transfers: Sit to Stand Sit to Stand: 7: Independent Ambulation/Gait Ambulation/Gait Assistance: 6: Modified independent (Device/Increase time) Ambulation Distance (Feet): 100 Feet Assistive device: Rolling walker Gait Pattern: Trunk flexed General Gait Details: instruction given for increased thoracic extension, correct walker position Stairs: No Wheelchair Mobility Wheelchair Mobility: No    Exercises General Exercises - Lower Extremity Ankle Circles/Pumps: AROM;Both;10 reps;Supine Short Arc Quad: AROM;Both;10 reps;Supine Heel Slides: AROM;Both;10 reps;Supine Hip ABduction/ADduction: AROM;Both;10 reps;Supine   PT Diagnosis:    PT Problem List:   PT Treatment Interventions:      PT Goals (current goals can now be found in the care plan section)    Visit Information  Last PT Received On: 11/13/12 History of Present Illness: I was asked to verify O2 saturation levels prior to pt's d/c to home.  This was done earlier today with inconclusive results due to difficulty getting a good reading on the equipment used.  [facility] by [facility] were used.    Subjective Data      Cognition       Balance  Balance Balance Assessed: No  End of Session PT - End of Session Equipment Utilized During Treatment: Gait belt Activity Tolerance: Patient tolerated treatment well Patient left: in chair;with call bell/phone within reach;with chair alarm set   GP     Konrad Penta 11/13/2012, 12:05 PM

## 2012-11-13 NOTE — Discharge Summary (Signed)
Physician Discharge Summary  Bryce Mcdonald YQM:[id number] DOB: 03-24-1921 DOA: 11/10/2012  PCP: No PCP Per Patient  Admit date: 11/10/2012 Discharge date: 11/13/2012  Time spent:  Recommendations for Outpatient Follow-up:  1. [employer]. In 1 week for evaluation of symptoms.  2. HH for assistance with medications and chronic illness managment  Discharge Diagnoses:  Principal Problem:   Acute on chronic diastolic congestive Heart failure Active Problems:   HTN (hypertension)   Atrial fibrillation with slow ventricular response   Chronic anticoagulation   Type 2 diabetes mellitus   DVT, bilateral lower limbs, History of   LVH (left ventricular hypertrophy)   Discharge Condition: stable  Diet recommendation: heart healthy  Filed Weights   11/11/12 0304 11/12/12 0316 11/13/12 0509  Weight: 89 kg (196 lb 3.4 oz) 81.3 kg (179 lb 3.7 oz) 80.6 kg (177 lb 11.1 oz)    History of present illness:  Bryce Mcdonald is a 79 y.o. male who described a history of dyspnea associated with cough productive of white sputum upon presentation to ED on 11/11/12. There was no fever. He noticed that he got short of breath on minimal exertion. This was a new symptom for him. He did have a history of chronic atrial fibrillation with slow ventricular response, hypertension, diet-controlled diabetes. He denied any chest pain.Marland Kitchen He also has a history of thromboembolic disease and is on chronic anticoagulation with Lovenox.    Hospital Course:  Congestive heart failure, probably diastolic. Acute on chronic. Likely related to dietary non-compliance. Admitted for diuresis. Evaluated by cardiology who increased lisipopril and reduced BB due to intermittent bradycardia. Volume status -4.L at discharge. Good urine output. IV lasix transitioned to po. Will be discharged on Lasix 40mg  po daily per cards.  Weight 80.6kg down from 89kg. output. History of left ventricular hypertrophy. Recent echo 8/14 yields  EF 60%. Oxygen saturation level dropped on room air with ambulation. PT evaluated. Off oxygen for 15 minutes sat 94% at rest. Ambulated 100' on RA and sat 87%, oxygen at 2L added and ambulated 100' and sat 93%. Will order home oxygen. Bryce Mcdonald will call patient for follow up appointment.  Chronic atrial fibrillation:with slow ventricular response. Pt on lovenox at home and this is continued. TSH within normal limits.   Hypertension. Fair control. SBP range 92-115. Low dose ACE started per cards. Home isosorbide resumed. Low dose BB as well.   Type 2 diabetes mellitus, diet controlled. A1c 6.7. CBG range 100-126.   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 Current level appears at baseline   Anemia: likely of chronic disease. No s/sx bleeding. Stable at baseline.    Procedures:  none  Consultations:  cardiology  Discharge Exam: Filed Vitals:   11/13/12 0532  BP: 114/59  Pulse: 61  Temp: 97.8 F (36.6 C)  Resp: 18    General: well nourished NAD Cardiovascular: RRR No MGR No LE edema Respiratory: normal effort with conversation. No wheeze, no rales Abdomen: soft +BS non-tender to palpation  Discharge Instructions     Medication List         enoxaparin 120 MG/0.8ML injection  Commonly known as:  LOVENOX  Inject 0.8 mLs (120 mg total) into the skin daily. This is a dose increase from your previous dose.     furosemide 40 MG tablet  Commonly known as:  LASIX  Take 1 tablet (40 mg total) by mouth 2 (two) times daily.     isosorbide mononitrate 30 MG 24 hr  tablet  Commonly known as:  IMDUR  Take 1 tablet (30 mg total) by mouth daily. Long acting nitroglycerin medicine for your heart.     levothyroxine 50 MCG tablet  Commonly known as:  SYNTHROID, LEVOTHROID  Take 50 mcg by mouth daily before breakfast.     lisinopril 10 MG tablet  Commonly known as:  PRINIVIL,ZESTRIL  Take 1 tablet (10 mg total) by mouth daily.      metoprolol tartrate 12.5 mg Tabs tablet  Commonly known as:  LOPRESSOR  Take 0.5 tablets (12.5 mg total) by mouth 2 (two) times daily.     omeprazole 40 MG capsule  Commonly known as:  PRILOSEC  Take 1 capsule (40 mg total) by mouth daily. For treatment of acid indigestion/reflux.     potassium chloride SA 20 MEQ tablet  Commonly known as:  K-DUR,KLOR-CON  Take 1 tablet (20 mEq total) by mouth 2 (two) times daily.     PROVENTIL HFA 108 (90 BASE) MCG/ACT inhaler  Generic drug:  albuterol  Inhale 2 puffs into the lungs every 6 (six) hours as needed for wheezing or shortness of breath.     simvastatin 40 MG tablet  Commonly known as:  ZOCOR  Take 20 mg by mouth at bedtime.     terazosin 1 MG capsule  Commonly known as:  HYTRIN  Take 1 mg by mouth at bedtime.       Allergies  Allergen Reactions  . Codeine Shortness Of Breath    Shortness of breath       Follow-up Information   Schedule an appointment as soon as possible for a visit in 1 week to follow up. [facility]. for evaluation of symptoms)    Contact information:   patient will follow up with Dr. Ozzie Hoyle at [facility] in 1 week for evaluation of symptoms       The results of significant diagnostics from this hospitalization (including imaging, microbiology, ancillary and laboratory) are listed below for reference.    Significant Diagnostic Studies: Dg Chest Portable 1 View  11/10/2012   CLINICAL DATA:  Shortness of Breath.  EXAM: PORTABLE CHEST - 1 VIEW  COMPARISON:  CT chest 10/10/2012 and plain films of the chest 09/2012 and 12/24/2011.  FINDINGS: There is chronic scarring in the lung bases. Emphysematous disease is identified. No consolidative process, pneumothorax or effusion is seen. Heart size is normal. No focal bony abnormality.  IMPRESSION: No acute finding. Stable compared to prior exams.   Electronically Signed   By: Drusilla Kanner M.D.   On: 11/10/2012 21:40    Microbiology: No results found  for this or any previous visit (from the past 240 hour(s)).   Labs: Basic Metabolic Panel:  Recent Labs Lab 11/10/12 2136 11/11/12 0309 11/12/12 0555  NA 137 137 141  K 3.9 3.8 3.6  CL 102 100 101  CO2 30 27 32  GLUCOSE 115* 181* 108*  BUN 15 16 23   CREATININE 1.12 1.07 1.31  CALCIUM 9.7 10.0 9.8   Liver Function Tests:  Recent Labs Lab 11/10/12 2136 11/11/12 0309  AST 49* 38*  ALT 56* 53  ALKPHOS 100 105  BILITOT 0.2* 0.3  PROT 6.6 7.0  ALBUMIN 3.7 4.0   No results found for this basename: LIPASE, AMYLASE,  in the last 168 hours No results found for this basename: AMMONIA,  in the last 168 hours CBC:  Recent Labs Lab 11/10/12 2136 11/11/12 0309 11/12/12 0555  WBC 3.2* 3.3* 3.7*  NEUTROABS 1.2*  --   --   HGB 10.2* 11.1* 10.4*  HCT 31.8* 33.7* 31.4*  MCV 99.4 97.1 97.2  PLT 146* 159 151   Cardiac Enzymes:  Recent Labs Lab 11/10/12 2136 11/11/12 0309 11/11/12 0844 11/11/12 1459  TROPONINI <0.30 <0.30 <0.30 <0.30   BNP: BNP (last 3 results)  Recent Labs  09/29/12 2348 11/10/12 2136  PROBNP 2067.0* 1765.0*   CBG:  Recent Labs Lab 11/12/12 1138 11/12/12 1647 11/12/12 2114 11/13/12 0754 11/13/12 1208  GLUCAP 113* 103* 114* 100* 109*       Signed:  BLACK,KAREN M  [facility] 11/13/2012, 1:05 PM  Attending note:  Patient seen and examined.  Agree with note as above per Toya Smothers, NP. Patient admitted with acute on chronic diastolic CHF.  Improved with diuresis.  Cleared for discharge per cardiology, will follow up with them as an outpatient.  Will be discharged home on oxygen which will need to be reassessed in the outpatient setting. Educated on the importance of medication/dietary compliance  MEMON,JEHANZEB

## 2012-11-13 NOTE — Plan of Care (Signed)
Problem: Discharge Progression Outcomes Goal: Able to perform self care activities Outcome: Completed/Met Date Met:  11/13/12 Pt given scale and living with heart failure booklet Goal: If EF < 40% ACEI/ARB addressed at discharge Outcome: Completed/Met Date Met:  11/13/12 60/65% Goal: Hemodynamically stable Outcome: Completed/Met Date Met:  11/13/12 O2 for home

## 2012-11-13 NOTE — Progress Notes (Signed)
Physical Therapy Treatment Patient Details Name: Bryce Mcdonald MRN: [id number] DOB: 16-Aug-1921 Today's Date: 11/13/2012 Time: 4098-1191 PT Time Calculation (min): 35 min  PT Assessment / Plan / Recommendation  History of Present Illness     PT Comments   Pt is alert and oriented, reports feeling well.  He is not normally on supplemental O2, so it was elected to verify O2 sat on RA.  O2 sat with 2 L O2 was 98%.  He tolerated supine therapeutic exercise with no dyspnea and was able to ambulate 100' with a walker and good stability.  It was quite difficult to obtain an accurate O2 sat on RA, but readings never got above 88%.  O2 was replaced at 2 L/min and within 2 minutes, O2 sat was stable at 98%.  We might need to have nursing service repeat this test,  but it appears that he will need supplemental O2 at home.  As far as mobility, he has met PT goals and should be able to transition to home with no PT intervention.  Follow Up Recommendations        Does the patient have the potential to tolerate intense rehabilitation     Barriers to Discharge        Equipment Recommendations  None recommended by PT    Recommendations for Other Services    Frequency     Progress towards PT Goals Progress towards PT goals: Goals met/education completed, patient discharged from PT  Plan Current plan remains appropriate    Precautions / Restrictions     Pertinent Vitals/Pain     Mobility  Bed Mobility Supine to Sit: 7: Independent;HOB flat Transfers Transfers: Sit to Stand Sit to Stand: 7: Independent Ambulation/Gait Ambulation/Gait Assistance: 6: Modified independent (Device/Increase time) Ambulation Distance (Feet): 100 Feet Assistive device: Rolling walker Gait Pattern: Trunk flexed General Gait Details: instruction given for increased thoracic extension, correct walker position Stairs: No Wheelchair Mobility Wheelchair Mobility: No    Exercises General Exercises - Lower  Extremity Ankle Circles/Pumps: AROM;Both;10 reps;Supine Short Arc Quad: AROM;Both;10 reps;Supine Heel Slides: AROM;Both;10 reps;Supine Hip ABduction/ADduction: AROM;Both;10 reps;Supine   PT Diagnosis:    PT Problem List:   PT Treatment Interventions:     PT Goals (current goals can now be found in the care plan section)    Visit Information  Last PT Received On: 11/13/12    Subjective Data      Cognition       Balance  Balance Balance Assessed: No  End of Session PT - End of Session Equipment Utilized During Treatment: Gait belt Activity Tolerance: Patient tolerated treatment well Patient left: in chair;with call bell/phone within reach;with chair alarm set   GP     Konrad Penta 11/13/2012, 9:45 AM

## 2012-11-14 DIAGNOSIS — I4510 Unspecified right bundle-branch block: Secondary | ICD-10-CM

## 2012-11-14 DIAGNOSIS — I509 Heart failure, unspecified: Secondary | ICD-10-CM

## 2012-11-14 DIAGNOSIS — E119 Type 2 diabetes mellitus without complications: Secondary | ICD-10-CM

## 2012-11-14 DIAGNOSIS — I4460 Unspecified fascicular block: Secondary | ICD-10-CM

## 2012-11-14 DIAGNOSIS — E785 Hyperlipidemia, unspecified: Secondary | ICD-10-CM

## 2012-11-14 DIAGNOSIS — Z7901 Long term (current) use of anticoagulants: Secondary | ICD-10-CM

## 2012-11-14 DIAGNOSIS — I10 Essential (primary) hypertension: Secondary | ICD-10-CM

## 2012-11-14 DIAGNOSIS — I517 Cardiomegaly: Secondary | ICD-10-CM

## 2012-11-14 DIAGNOSIS — I4891 Unspecified atrial fibrillation: Secondary | ICD-10-CM

## 2012-11-14 DIAGNOSIS — I5033 Acute on chronic diastolic (congestive) heart failure: Secondary | ICD-10-CM

## 2012-11-14 LAB — GLUCOSE, CAPILLARY: Glucose-Capillary: 101 mg/dL — ABNORMAL HIGH (ref 70–99)

## 2012-11-14 NOTE — Discharge Summary (Signed)
Discharge delayed due to home oxygen not being delivered to home.   Pt remains stable and medically ready for discharge.  See discharge summary dated 11/13/12    Lesle Chris. Black, NP

## 2012-11-14 NOTE — Progress Notes (Signed)
Pt discharged to home with home O2

## 2012-11-14 NOTE — Discharge Summary (Signed)
Patient seen and examined.  Agree with note as per Toya Smothers, NP  No new complaints today, exam unchanged.  Did not go home yesterday due to issues with home oxygen being not being delivered.  Discharge home today.  See discharge summary from yesterday for further details.  MEMON,JEHANZEB

## 2012-11-14 NOTE — Progress Notes (Signed)
Spoke with Bryce Mcdonald from [facility]. He says that calls to get in contact with a family member at the patient's residence have not been returned. At this point it is too late to have the O2 delivered. Will contact the MD on call for further orders.

## 2012-11-14 NOTE — Progress Notes (Signed)
Contacted MD on call to let him know that although the patient was supposed to be discharged, his portable oxygen was never delivered. MD said it was okay for the patient to stay overnight and sort out the issue with the O2 tomorrow morning.

## 2012-12-04 ENCOUNTER — Emergency Department (HOSPITAL_COMMUNITY): Payer: Non-veteran care

## 2012-12-04 ENCOUNTER — Encounter (HOSPITAL_COMMUNITY): Payer: Self-pay | Admitting: Emergency Medicine

## 2012-12-04 ENCOUNTER — Other Ambulatory Visit: Payer: Self-pay

## 2012-12-04 ENCOUNTER — Observation Stay (HOSPITAL_COMMUNITY): Payer: Non-veteran care

## 2012-12-04 ENCOUNTER — Inpatient Hospital Stay (HOSPITAL_COMMUNITY)
Admission: EM | Admit: 2012-12-04 | Discharge: 2012-12-06 | DRG: 683 | Disposition: A | Discharge status: Home Health | Payer: Non-veteran care | Attending: Internal Medicine | Admitting: Internal Medicine

## 2012-12-04 DIAGNOSIS — R110 Nausea: Secondary | ICD-10-CM

## 2012-12-04 DIAGNOSIS — Z23 Encounter for immunization: Secondary | ICD-10-CM

## 2012-12-04 DIAGNOSIS — R197 Diarrhea, unspecified: Secondary | ICD-10-CM | POA: Diagnosis not present

## 2012-12-04 DIAGNOSIS — R1032 Left lower quadrant pain: Secondary | ICD-10-CM | POA: Diagnosis present

## 2012-12-04 DIAGNOSIS — Z9181 History of falling: Secondary | ICD-10-CM

## 2012-12-04 DIAGNOSIS — Z87891 Personal history of nicotine dependence: Secondary | ICD-10-CM

## 2012-12-04 DIAGNOSIS — I4510 Unspecified right bundle-branch block: Secondary | ICD-10-CM

## 2012-12-04 DIAGNOSIS — N179 Acute kidney failure, unspecified: Principal | ICD-10-CM | POA: Diagnosis present

## 2012-12-04 DIAGNOSIS — K439 Ventral hernia without obstruction or gangrene: Secondary | ICD-10-CM

## 2012-12-04 DIAGNOSIS — E785 Hyperlipidemia, unspecified: Secondary | ICD-10-CM

## 2012-12-04 DIAGNOSIS — I509 Heart failure, unspecified: Secondary | ICD-10-CM

## 2012-12-04 DIAGNOSIS — Z85038 Personal history of other malignant neoplasm of large intestine: Secondary | ICD-10-CM

## 2012-12-04 DIAGNOSIS — I5032 Chronic diastolic (congestive) heart failure: Secondary | ICD-10-CM

## 2012-12-04 DIAGNOSIS — I4891 Unspecified atrial fibrillation: Secondary | ICD-10-CM

## 2012-12-04 DIAGNOSIS — Z7901 Long term (current) use of anticoagulants: Secondary | ICD-10-CM

## 2012-12-04 DIAGNOSIS — Z9049 Acquired absence of other specified parts of digestive tract: Secondary | ICD-10-CM

## 2012-12-04 DIAGNOSIS — N390 Urinary tract infection, site not specified: Secondary | ICD-10-CM

## 2012-12-04 DIAGNOSIS — D708 Other neutropenia: Secondary | ICD-10-CM | POA: Diagnosis present

## 2012-12-04 DIAGNOSIS — N200 Calculus of kidney: Secondary | ICD-10-CM

## 2012-12-04 DIAGNOSIS — E860 Dehydration: Secondary | ICD-10-CM

## 2012-12-04 DIAGNOSIS — E039 Hypothyroidism, unspecified: Secondary | ICD-10-CM

## 2012-12-04 DIAGNOSIS — I10 Essential (primary) hypertension: Secondary | ICD-10-CM

## 2012-12-04 DIAGNOSIS — E440 Moderate protein-calorie malnutrition: Secondary | ICD-10-CM | POA: Diagnosis present

## 2012-12-04 DIAGNOSIS — I517 Cardiomegaly: Secondary | ICD-10-CM

## 2012-12-04 DIAGNOSIS — Z86711 Personal history of pulmonary embolism: Secondary | ICD-10-CM

## 2012-12-04 DIAGNOSIS — E7800 Pure hypercholesterolemia, unspecified: Secondary | ICD-10-CM | POA: Diagnosis present

## 2012-12-04 DIAGNOSIS — I82403 Acute embolism and thrombosis of unspecified deep veins of lower extremity, bilateral: Secondary | ICD-10-CM

## 2012-12-04 DIAGNOSIS — D649 Anemia, unspecified: Secondary | ICD-10-CM

## 2012-12-04 DIAGNOSIS — E119 Type 2 diabetes mellitus without complications: Secondary | ICD-10-CM

## 2012-12-04 DIAGNOSIS — Z79899 Other long term (current) drug therapy: Secondary | ICD-10-CM

## 2012-12-04 DIAGNOSIS — R109 Unspecified abdominal pain: Secondary | ICD-10-CM | POA: Diagnosis not present

## 2012-12-04 DIAGNOSIS — N281 Cyst of kidney, acquired: Secondary | ICD-10-CM

## 2012-12-04 DIAGNOSIS — D72819 Decreased white blood cell count, unspecified: Secondary | ICD-10-CM

## 2012-12-04 DIAGNOSIS — C61 Malignant neoplasm of prostate: Secondary | ICD-10-CM

## 2012-12-04 DIAGNOSIS — Z86718 Personal history of other venous thrombosis and embolism: Secondary | ICD-10-CM

## 2012-12-04 DIAGNOSIS — I2510 Atherosclerotic heart disease of native coronary artery without angina pectoris: Secondary | ICD-10-CM | POA: Diagnosis present

## 2012-12-04 DIAGNOSIS — R1310 Dysphagia, unspecified: Secondary | ICD-10-CM

## 2012-12-04 HISTORY — DX: Cyst of kidney, acquired: N28.1

## 2012-12-04 HISTORY — DX: Calculus of kidney: N20.0

## 2012-12-04 LAB — CBC WITH DIFFERENTIAL/PLATELET
Basophils Relative: 0 % (ref 0–1)
Eosinophils Relative: 0 % (ref 0–5)
HCT: 36.9 % — ABNORMAL LOW (ref 39.0–52.0)
Lymphocytes Relative: 53 % — ABNORMAL HIGH (ref 12–46)
Lymphs Abs: 1.8 10*3/uL (ref 0.7–4.0)
MCH: 32.2 pg (ref 26.0–34.0)
MCHC: 34.1 g/dL (ref 30.0–36.0)
MCV: 94.4 fL (ref 78.0–100.0)
Monocytes Absolute: 0.4 10*3/uL (ref 0.1–1.0)
Platelets: 187 10*3/uL (ref 150–400)
RBC: 3.91 MIL/uL — ABNORMAL LOW (ref 4.22–5.81)
RDW: 13.4 % (ref 11.5–15.5)
WBC: 3.3 10*3/uL — ABNORMAL LOW (ref 4.0–10.5)

## 2012-12-04 LAB — URINALYSIS, ROUTINE W REFLEX MICROSCOPIC
Bilirubin Urine: NEGATIVE
Glucose, UA: NEGATIVE mg/dL
Hgb urine dipstick: NEGATIVE
Ketones, ur: NEGATIVE mg/dL
pH: 5.5 (ref 5.0–8.0)

## 2012-12-04 LAB — COMPREHENSIVE METABOLIC PANEL
BUN: 46 mg/dL — ABNORMAL HIGH (ref 6–23)
CO2: 28 mEq/L (ref 19–32)
Chloride: 97 mEq/L (ref 96–112)
Creatinine, Ser: 2.25 mg/dL — ABNORMAL HIGH (ref 0.50–1.35)
GFR calc non Af Amer: 24 mL/min — ABNORMAL LOW (ref >90)
Sodium: 136 mEq/L (ref 135–145)
Total Bilirubin: 0.3 mg/dL (ref 0.3–1.2)

## 2012-12-04 LAB — GLUCOSE, CAPILLARY: Glucose-Capillary: 93 mg/dL (ref 70–99)

## 2012-12-04 LAB — LIPID PANEL
HDL: 27 mg/dL — ABNORMAL LOW (ref >39)
LDL Cholesterol: 55 mg/dL (ref 0–99)
Triglycerides: 120 mg/dL (ref <150)
VLDL: 24 mg/dL (ref 0–40)

## 2012-12-04 LAB — LIPASE, BLOOD: Lipase: 52 U/L (ref 11–59)

## 2012-12-04 LAB — LACTIC ACID, PLASMA: Lactic Acid, Venous: 1.4 mmol/L (ref 0.5–2.2)

## 2012-12-04 MED ORDER — SODIUM CHLORIDE 0.9 % IV BOLUS (SEPSIS)
1000.0000 mL | Freq: Once | INTRAVENOUS | Status: AC
  Administered 2012-12-04: 1000 mL via INTRAVENOUS

## 2012-12-04 MED ORDER — HYDROMORPHONE HCL PF 1 MG/ML IJ SOLN: 0.5000 mg | INTRAMUSCULAR | Status: DC | PRN

## 2012-12-04 MED ORDER — BISACODYL 10 MG RE SUPP: 10.0000 mg | Freq: Every day | RECTAL | Status: DC | PRN

## 2012-12-04 MED ORDER — ONDANSETRON HCL 4 MG/2ML IJ SOLN
4.0000 mg | Freq: Once | INTRAMUSCULAR | Status: AC
  Administered 2012-12-04: 4 mg via INTRAVENOUS
  Filled 2012-12-04: qty 2

## 2012-12-04 MED ORDER — FLEET ENEMA 7-19 GM/118ML RE ENEM: 1.0000 | ENEMA | Freq: Once | RECTAL | Status: AC | PRN

## 2012-12-04 MED ORDER — ONDANSETRON HCL 4 MG PO TABS: 4.0000 mg | ORAL_TABLET | Freq: Four times a day (QID) | ORAL | Status: DC | PRN

## 2012-12-04 MED ORDER — PANTOPRAZOLE SODIUM 40 MG IV SOLR
40.0000 mg | Freq: Two times a day (BID) | INTRAVENOUS | Status: DC
  Administered 2012-12-04 – 2012-12-06 (×5): 40 mg via INTRAVENOUS
  Filled 2012-12-04 (×5): qty 40

## 2012-12-04 MED ORDER — ONDANSETRON HCL 4 MG/2ML IJ SOLN
4.0000 mg | Freq: Four times a day (QID) | INTRAMUSCULAR | Status: DC | PRN
  Administered 2012-12-05: 4 mg via INTRAVENOUS
  Filled 2012-12-04: qty 2

## 2012-12-04 MED ORDER — HYDROCODONE-ACETAMINOPHEN 5-325 MG PO TABS: 1.0000 | ORAL_TABLET | ORAL | Status: DC | PRN

## 2012-12-04 MED ORDER — INSULIN ASPART 100 UNIT/ML ~~LOC~~ SOLN: 0.0000 [IU] | Freq: Every day | SUBCUTANEOUS | Status: DC

## 2012-12-04 MED ORDER — LEVOTHYROXINE SODIUM 50 MCG PO TABS
50.0000 ug | ORAL_TABLET | Freq: Every day | ORAL | Status: DC
  Administered 2012-12-05 – 2012-12-06 (×2): 50 ug via ORAL
  Filled 2012-12-04 (×2): qty 1

## 2012-12-04 MED ORDER — SODIUM CHLORIDE 0.9 % IV SOLN: INTRAVENOUS | Status: DC

## 2012-12-04 MED ORDER — TERAZOSIN HCL 1 MG PO CAPS
1.0000 mg | ORAL_CAPSULE | Freq: Every day | ORAL | Status: DC
  Administered 2012-12-04 – 2012-12-05 (×2): 1 mg via ORAL
  Filled 2012-12-04 (×2): qty 1

## 2012-12-04 MED ORDER — INFLUENZA VAC SPLIT QUAD 0.5 ML IM SUSP
0.5000 mL | INTRAMUSCULAR | Status: AC
  Filled 2012-12-04: qty 0.5

## 2012-12-04 MED ORDER — SODIUM CHLORIDE 0.9 % IV SOLN
INTRAVENOUS | Status: DC
  Administered 2012-12-04: 20:00:00 via INTRAVENOUS

## 2012-12-04 MED ORDER — INSULIN ASPART 100 UNIT/ML ~~LOC~~ SOLN: 0.0000 [IU] | Freq: Three times a day (TID) | SUBCUTANEOUS | Status: DC

## 2012-12-04 MED ORDER — ACETAMINOPHEN 325 MG PO TABS: 650.0000 mg | ORAL_TABLET | Freq: Four times a day (QID) | ORAL | Status: DC | PRN

## 2012-12-04 MED ORDER — ENOXAPARIN SODIUM 100 MG/ML ~~LOC~~ SOLN
100.0000 mg | Freq: Two times a day (BID) | SUBCUTANEOUS | Status: DC
  Administered 2012-12-04 – 2012-12-05 (×3): 100 mg via SUBCUTANEOUS
  Filled 2012-12-04 (×3): qty 1

## 2012-12-04 MED ORDER — ONDANSETRON HCL 4 MG/2ML IJ SOLN: 4.0000 mg | Freq: Three times a day (TID) | INTRAMUSCULAR | Status: DC | PRN

## 2012-12-04 MED ORDER — ACETAMINOPHEN 650 MG RE SUPP: 650.0000 mg | Freq: Four times a day (QID) | RECTAL | Status: DC | PRN

## 2012-12-04 MED ORDER — SODIUM CHLORIDE 0.9 % IJ SOLN
3.0000 mL | Freq: Two times a day (BID) | INTRAMUSCULAR | Status: DC
  Administered 2012-12-05: 3 mL via INTRAVENOUS

## 2012-12-04 MED ORDER — ALBUTEROL SULFATE HFA 108 (90 BASE) MCG/ACT IN AERS: 2.0000 | INHALATION_SPRAY | Freq: Four times a day (QID) | RESPIRATORY_TRACT | Status: DC | PRN

## 2012-12-04 MED ORDER — ALUM & MAG HYDROXIDE-SIMETH 200-200-20 MG/5ML PO SUSP: 30.0000 mL | Freq: Four times a day (QID) | ORAL | Status: DC | PRN

## 2012-12-04 MED ORDER — ISOSORBIDE MONONITRATE ER 60 MG PO TB24
30.0000 mg | ORAL_TABLET | Freq: Every day | ORAL | Status: DC
  Administered 2012-12-05 – 2012-12-06 (×2): 30 mg via ORAL
  Filled 2012-12-04 (×2): qty 1

## 2012-12-04 NOTE — ED Notes (Signed)
Beeped to [phone].Bryce Mcdonald

## 2012-12-04 NOTE — ED Notes (Signed)
Pt reports has had fever, abd pain, and vomiting since last night.  Denies diarrhea.  Reports LBM was 2 days ago.

## 2012-12-04 NOTE — ED Provider Notes (Signed)
CSN: [id number]     Arrival date & time 12/04/12  0905 History  This chart was scribed for Benny Lennert, MD by Quintella Reichert, ED scribe.  This patient was seen in room APA06/APA06 and the patient's care was started at 9:26 AM.  Chief Complaint  Patient presents with  . Abdominal Pain  . Nausea    Patient is a 79 y.o. male presenting with abdominal pain. The history is provided by the patient and a relative. No language interpreter was used.  Abdominal Pain Pain location: Left side. Pain severity:  Moderate Duration: Began last night. Chronicity:  New Context: recent illness (hospitalized at [facility], thinks he was diagnosed with pneumonia)   Associated symptoms: nausea   Associated symptoms: no chest pain, no cough, no diarrhea, no fatigue, no hematuria and no vomiting   Associated symptoms comment:  Eating and drinking less Nausea:    Nausea duration: Began 2 nights ago. Risk factors: being elderly, multiple surgeries and recent hospitalization     HPI Comments: Bryce Mcdonald is a 79 y.o. male with h/o colon cancer who presents to the Emergency Department complaining of nausea that began 2 nights ago with associated left-sided abdominal pain that began last night.  Pt states he is "heaving" and trying to vomit but denies emesis.  Daughter notes he has not been eating or drinking.  Pt was recently admitted to [facility] for 3 days and states he thinks he was diagnosed with pneumonia.  He denies diarrhea or blood in stool.   Past Medical History  Diagnosis Date  . Colon cancer     Status post partial colectomy  . Hypertension   . Hypercholesterolemia   . Blood transfusion   . Coronary artery disease 2011    Non obstructive CAD  . DVT (deep venous thrombosis)   . PE (pulmonary embolism)   . Diabetes mellitus without complication   . A-fib   . Diabetes mellitus, type II   . Ventral hernia   . Right bundle branch block   . Unspecified hypothyroidism   . LVH (left  ventricular hypertrophy) 10/10/2012    Ejection fraction 60-65%.    Past Surgical History  Procedure Laterality Date  . Abdominal surgery      x 3  . Cardiac catheterization  2008    No family history on file.   History  Substance Use Topics  . Smoking status: Former Games developer  . Smokeless tobacco: Not on file     Comment: quit over 40 years ago  . Alcohol Use: No     Comment: quit over forty years ago     Review of Systems  Constitutional: Positive for appetite change. Negative for fatigue.  HENT: Negative for congestion, ear discharge and sinus pressure.   Eyes: Negative for discharge.  Respiratory: Negative for cough.   Cardiovascular: Negative for chest pain.  Gastrointestinal: Positive for nausea and abdominal pain. Negative for vomiting and diarrhea.  Genitourinary: Negative for frequency and hematuria.  Musculoskeletal: Negative for back pain.  Skin: Negative for rash.  Neurological: Negative for seizures and headaches.  Psychiatric/Behavioral: Negative for hallucinations.     Allergies  Codeine  Home Medications   Current Outpatient Rx  Name  Route  Sig  Dispense  Refill  . albuterol (PROVENTIL HFA) 108 (90 BASE) MCG/ACT inhaler   Inhalation   Inhale 2 puffs into the lungs every 6 (six) hours as needed for wheezing or shortness of breath.         Marland Kitchen  enoxaparin (LOVENOX) 120 MG/0.8ML injection   Subcutaneous   Inject 0.8 mLs (120 mg total) into the skin daily. This is a dose increase from your previous dose.   31 Syringe   6   . furosemide (LASIX) 40 MG tablet   Oral   Take 1 tablet (40 mg total) by mouth daily.   60 tablet   0   . isosorbide mononitrate (IMDUR) 30 MG 24 hr tablet   Oral   Take 1 tablet (30 mg total) by mouth daily. Long acting nitroglycerin medicine for your heart.   30 tablet   6   . levothyroxine (SYNTHROID, LEVOTHROID) 50 MCG tablet   Oral   Take 50 mcg by mouth daily before breakfast.         . lisinopril  (PRINIVIL,ZESTRIL) 10 MG tablet   Oral   Take 1 tablet (10 mg total) by mouth daily.   30 tablet   0   . metoprolol tartrate (LOPRESSOR) 12.5 mg TABS tablet   Oral   Take 0.5 tablets (12.5 mg total) by mouth 2 (two) times daily.   30 tablet   0   . omeprazole (PRILOSEC) 40 MG capsule   Oral   Take 1 capsule (40 mg total) by mouth daily. For treatment of acid indigestion/reflux.   30 capsule   6   . potassium chloride SA (K-DUR,KLOR-CON) 20 MEQ tablet   Oral   Take 1 tablet (20 mEq total) by mouth 2 (two) times daily.   60 tablet   0   . simvastatin (ZOCOR) 40 MG tablet   Oral   Take 20 mg by mouth at bedtime.          Marland Kitchen terazosin (HYTRIN) 1 MG capsule   Oral   Take 1 mg by mouth at bedtime.          BP 90/54  Pulse 73  Temp(Src) 97.4 F (36.3 C) (Oral)  Resp 26  SpO2 100%  Physical Exam  Nursing note and vitals reviewed. Constitutional: He is oriented to person, place, and time. He appears well-developed.  HENT:  Head: Normocephalic.  Mouth/Throat: Mucous membranes are dry.  Eyes: Conjunctivae and EOM are normal. No scleral icterus.  Neck: Neck supple. No thyromegaly present.  Cardiovascular: Normal rate and regular rhythm.  Exam reveals no gallop and no friction rub.   No murmur heard. Pulmonary/Chest: No stridor. He has no wheezes. He has no rales. He exhibits no tenderness.  Abdominal: He exhibits no distension. There is tenderness (moderate) in the left lower quadrant. There is no rebound.  Musculoskeletal: Normal range of motion. He exhibits no edema.  Lymphadenopathy:    He has no cervical adenopathy.  Neurological: He is oriented to person, place, and time. He exhibits normal muscle tone. Coordination normal.  Skin: No rash noted. No erythema.  Psychiatric: He has a normal mood and affect. His behavior is normal.    ED Course  Procedures (including critical care time)  DIAGNOSTIC STUDIES: Oxygen Saturation is 100% on room air, normal by my  interpretation.    COORDINATION OF CARE: 9:30 AM: Discussed treatment plan which includes IV fluids, foley catheter placement, EKG, CXR, and labs.  Pt and family expressed understanding and agreed to plan.   Labs Review Labs Reviewed  CBC WITH DIFFERENTIAL - Abnormal; Notable for the following:    WBC 3.3 (*)    RBC 3.91 (*)    Hemoglobin 12.6 (*)    HCT 36.9 (*)  Neutrophils Relative % 34 (*)    Neutro Abs 1.1 (*)    Lymphocytes Relative 53 (*)    All other components within normal limits  COMPREHENSIVE METABOLIC PANEL - Abnormal; Notable for the following:    Glucose, Bld 127 (*)    BUN 46 (*)    Creatinine, Ser 2.25 (*)    GFR calc non Af Amer 24 (*)    GFR calc Af Amer 28 (*)    All other components within normal limits  URINALYSIS, ROUTINE W REFLEX MICROSCOPIC  LACTIC ACID, PLASMA  LIPASE, BLOOD    Imaging Review Ct Abdomen Pelvis Wo Contrast  12/04/2012   CLINICAL DATA:  Left flank pain with nausea and fever. History of hypertension, diabetes and colon cancer.  EXAM: CT ABDOMEN AND PELVIS WITHOUT CONTRAST  TECHNIQUE: Multidetector CT imaging of the abdomen and pelvis was performed following the standard protocol without intravenous contrast.  COMPARISON:  Abdominal pelvic CT 12/20/2008.  FINDINGS: Images through the lung bases demonstrate stable scarring and subpleural nodularity bilaterally. There is slightly increased patchy opacity in the lingula on images 6-9. There is a new small pericardial effusion. No significant pleural fluid is present. A small hiatal hernia is noted.  There is no evidence of urinary tract calculus or hydronephrosis. Renovascular calcifications and left renal cysts are stable. There is stable mild nodularity of both adrenal glands. The liver, pancreas and spleen appear unremarkable. There are possible small gallstones layering in the gallbladder lumen.  There are stable postsurgical changes status post partial colon resection. There is a broad-based  ventral hernia containing bowel. There is no evidence of bowel incarceration or obstruction. No inflammatory changes or enlarged lymph nodes are seen. The appendix appears normal. There are diffuse vascular calcifications, including calcifications within the lumen of both common iliac veins, unchanged. Linear calcifications in the right retroperitoneum are unchanged, likely postsurgical.  The urinary bladder is decompressed by a Foley catheter. There is possible mild bladder wall thickening.  Moderate arthropathic changes of both hips and diffuse lower lumbar spine facet disease are noted.  IMPRESSION: 1. No specific explanation for left flank pain. No evidence of urinary tract calculus or hydronephrosis. 2. New patchy opacity in the lingula and small pericardial effusion. Bibasilar scarring is otherwise unchanged. 3. Stable postsurgical changes related to partial colon resection with linear calcifications in the right retroperitoneum. No evidence of metastatic disease. 4. Stable ventral hernia. No evidence of bowel obstruction or incarceration.   Electronically Signed   By: Roxy Horseman M.D.   On: 12/04/2012 11:48   Dg Chest Port 1 View  12/04/2012   CLINICAL DATA:  Shortness of breath.  EXAM: PORTABLE CHEST - 1 VIEW  COMPARISON:  11/10/2012 and CT chest 10/10/2012.  FINDINGS: Trachea is midline. Heart size normal. Probable pleural parenchymal scarring in the lower hemithoraces bilaterally. Lungs appear mildly hyperinflated but otherwise clear.  IMPRESSION: 1. Favor pleural parenchymal scarring at the bases of both hemithoraces. 2. Hyperinflation without acute findings.   Electronically Signed   By: Leanna Battles M.D.   On: 12/04/2012 09:58    EKG Interpretation   None      Date: 12/04/2012  Rate70  Rhythm: atrial fibrillation  QRS Axis: left  Intervals: normal  ST/T Wave abnormalities: nonspecific ST changes  Conduction Disutrbances:rbbb  Narrative Interpretation:   Old EKG Reviewed: none  available    MDM  No diagnosis found.     The chart was scribed for me under my direct supervision.  I  personally performed the history, physical, and medical decision making and all procedures in the evaluation of this patient.Benny Lennert, MD 12/04/12 [phone]

## 2012-12-04 NOTE — H&P (Signed)
[facility] History and Physical  Bryce Mcdonald EAV:[id number] DOB: 04/04/1921 DOA: 12/04/2012  Referring physician:  PCP: Provider Not In System  Specialists:   Chief Complaint: Abdominal pain and weakness with nausea  HPI: Bryce Mcdonald is a  79 y.o. male  with past medical history that includes chronic diastolic heart failure, diabetes, hypertension, CAD, A. fib, DVT, who presents to the emergency room with chief complaint of abdominal pain and weakness. Information is obtained from the patient. He indicates that 3 days ago he developed sudden sharp abdominal pain mostly in the upper left quadrant radiating back to the left flank area. Associated symptoms include nausea without vomiting, poor appetite, dizziness with position change and shortness of breath. He denies chest pain, palpitations headache, visual disturbances syncope or near-syncope. He indicates that he has had very little to eat or drink in the last 3 days. He denies diarrhea, constipation, melena, or hematochezia. He does report some frequency with urination but he denies dysuria or hematuria. Initial workup in the emergency department was significant for a creatinine of 2.25, WBC of 3.3, and hemoglobin of 12.6. Lipase and lactic acid are within the limits of normal. CT of the abdomen yields "no specific explanation for left flank pain; No evidence of urinary tract calculus or hydronephrosis; New patchy opacity in the lingula and small pericardial effusion; Bibasilar scarring is otherwise unchanged; Stable postsurgical changes related to partial colon resection with linear calcifications in the right retroperitoneum; No evidence of metastatic disease; Stable ventral hernia; No evidence of bowel obstruction or Incarceration."  Vital signs on admission are significant for a blood pressure of 90/50 and respiration rate of 26. Otherwise, he is afebrile and not tachycardic. In the emergency department he was given one liter of  normal saline intravenously. [facility] are asked to admit for further evaluation and treatment   Review of Systems: and point review of systems complete all systems are negative except as indicated in the history of present illness. Also, he has intermittent left side pain from a recent fall 1 month ago.   Past Medical History  Diagnosis Date  . Colon cancer     Status post partial colectomy  . Hypertension   . Hypercholesterolemia   . Blood transfusion   . Coronary artery disease 2011    Non obstructive CAD  . DVT (deep venous thrombosis)   . PE (pulmonary embolism)   . Diabetes mellitus without complication   . A-fib   . Diabetes mellitus, type II   . Ventral hernia   . Right bundle branch block   . Unspecified hypothyroidism   . LVH (left ventricular hypertrophy) 10/10/2012    Ejection fraction 60-65%.   Past Surgical History  Procedure Laterality Date  . Abdominal surgery      x 3  . Cardiac catheterization  2008   Social History:  reports that he has quit smoking. He does not have any smokeless tobacco history on file. He reports that he does not drink alcohol or use illicit drugs. He is a former smoker. He is a retired Nurse, mental health. He lives alone but has children close by. Independent with ADLs  Allergies  Allergen Reactions  . Codeine Shortness Of Breath    Shortness of breath    No family history on file. His mother deceased at 19. Cause of death "my sister scared her to death." Her deceased at age 75 due to colon cancer. He has 16 siblings their collective medical history is  positive for CAD, stroke, diabetes, colon cancer   Prior to Admission medications   Medication Sig Start Date End Date Taking? Authorizing Provider  enoxaparin (LOVENOX) 100 MG/ML injection Inject 100 mg into the skin 2 (two) times daily.    Yes Historical Provider, MD  furosemide (LASIX) 20 MG tablet Take 20 mg by mouth every morning.   Yes Historical Provider, MD   furosemide (LASIX) 40 MG tablet Take 40 mg by mouth at bedtime.   Yes Historical Provider, MD  isosorbide mononitrate (IMDUR) 30 MG 24 hr tablet Take 1 tablet (30 mg total) by mouth daily. Long acting nitroglycerin medicine for your heart. 10/11/12  Yes Elliot Cousin, MD  levothyroxine (SYNTHROID, LEVOTHROID) 50 MCG tablet Take 50 mcg by mouth daily before breakfast.   Yes Historical Provider, MD  lisinopril (PRINIVIL,ZESTRIL) 10 MG tablet Take 1 tablet (10 mg total) by mouth daily. 11/13/12  Yes Lesle Chris Black, NP  metoprolol (LOPRESSOR) 50 MG tablet Take 12.5 mg by mouth 2 (two) times daily.   Yes Historical Provider, MD  omeprazole (PRILOSEC) 40 MG capsule Take 1 capsule (40 mg total) by mouth daily. For treatment of acid indigestion/reflux. 10/11/12  Yes Elliot Cousin, MD  simvastatin (ZOCOR) 40 MG tablet Take 20 mg by mouth at bedtime.    Yes Historical Provider, MD  terazosin (HYTRIN) 1 MG capsule Take 1 mg by mouth at bedtime.   Yes Historical Provider, MD  albuterol (PROVENTIL HFA) 108 (90 BASE) MCG/ACT inhaler Inhale 2 puffs into the lungs every 6 (six) hours as needed for wheezing or shortness of breath.    Historical Provider, MD   Physical Exam: Filed Vitals:   12/04/12 1428  BP: 120/40  Pulse: 83  Temp: 97.8 F (36.6 C)  Resp: 18     General:  somewhat frail appearing 79 year old [place] American man, but in no acute distress.  Eyes: pupils are equal round reactive to light. EOMI. Conjunctivae are clear, sclerae are white.   ENT: Patient's ears are clear, nose is without drainage, oropharynx is without erythema or exudate. Mucous membranes of his mouth are slightly pale and dry. There is very poor dentition   Neck: Supple with full range of motion. There is no lymphadenopathy, no thyromegaly, no JVD..  Cardiovascular: S1 and S2. With a soft systolic murmur. Heart sounds are somewhat distant. Lower extremities without edema.   Respiratory: normal effort. Breath sounds  slightly distant and coarse, no wheezes or crackles.   Abdomen: Well-healed abdominal scar. Moderately large ventral hernia located to the right of center, soft, reducible, and nontender. Bowel sounds are present. Mild to moderate tenderness left lower quadrant and and left flank area. No associated rebound, masses palpated, or spasm.   Skin: warm and dry. Appreciate no rash or lesions.   Musculoskeletal: Joints without swelling or erythema. Nontender to palpation. Full range of motion   Psychiatric:  He is calm and cooperative   Neurologic: Patient is alert and oriented x3. His speech is slow but clear. Cranial nerves II through XII are grossly intact.   Labs on Admission:  Basic Metabolic Panel:  Recent Labs Lab 12/04/12 0935  NA 136  K 3.9  CL 97  CO2 28  GLUCOSE 127*  BUN 46*  CREATININE 2.25*  CALCIUM 10.1   Liver Function Tests:  Recent Labs Lab 12/04/12 0935  AST 13  ALT 5  ALKPHOS 68  BILITOT 0.3  PROT 7.1  ALBUMIN 3.9    Recent Labs Lab 12/04/12 0935  LIPASE 52   No results found for this basename: AMMONIA,  in the last 168 hours CBC:  Recent Labs Lab 12/04/12 0935  WBC 3.3*  NEUTROABS 1.1*  HGB 12.6*  HCT 36.9*  MCV 94.4  PLT 187   Cardiac Enzymes: No results found for this basename: CKTOTAL, CKMB, CKMBINDEX, TROPONINI,  in the last 168 hours  BNP (last 3 results)  Recent Labs  09/29/12 2348 11/10/12 2136  PROBNP 2067.0* 1765.0*   CBG:  Recent Labs Lab 12/04/12 1408  GLUCAP 83    Radiological Exams on Admission: Ct Abdomen Pelvis Wo Contrast  12/04/2012   CLINICAL DATA:  Left flank pain with nausea and fever. History of hypertension, diabetes and colon cancer.  EXAM: CT ABDOMEN AND PELVIS WITHOUT CONTRAST  TECHNIQUE: Multidetector CT imaging of the abdomen and pelvis was performed following the standard protocol without intravenous contrast.  COMPARISON:  Abdominal pelvic CT 12/20/2008.  FINDINGS: Images through the lung bases  demonstrate stable scarring and subpleural nodularity bilaterally. There is slightly increased patchy opacity in the lingula on images 6-9. There is a new small pericardial effusion. No significant pleural fluid is present. A small hiatal hernia is noted.  There is no evidence of urinary tract calculus or hydronephrosis. Renovascular calcifications and left renal cysts are stable. There is stable mild nodularity of both adrenal glands. The liver, pancreas and spleen appear unremarkable. There are possible small gallstones layering in the gallbladder lumen.  There are stable postsurgical changes status post partial colon resection. There is a broad-based ventral hernia containing bowel. There is no evidence of bowel incarceration or obstruction. No inflammatory changes or enlarged lymph nodes are seen. The appendix appears normal. There are diffuse vascular calcifications, including calcifications within the lumen of both common iliac veins, unchanged. Linear calcifications in the right retroperitoneum are unchanged, likely postsurgical.  The urinary bladder is decompressed by a Foley catheter. There is possible mild bladder wall thickening.  Moderate arthropathic changes of both hips and diffuse lower lumbar spine facet disease are noted.  IMPRESSION: 1. No specific explanation for left flank pain. No evidence of urinary tract calculus or hydronephrosis. 2. New patchy opacity in the lingula and small pericardial effusion. Bibasilar scarring is otherwise unchanged. 3. Stable postsurgical changes related to partial colon resection with linear calcifications in the right retroperitoneum. No evidence of metastatic disease. 4. Stable ventral hernia. No evidence of bowel obstruction or incarceration.   Electronically Signed   By: Roxy Horseman M.D.   On: 12/04/2012 11:48   Dg Chest Port 1 View  12/04/2012   CLINICAL DATA:  Shortness of breath.  EXAM: PORTABLE CHEST - 1 VIEW  COMPARISON:  11/10/2012 and CT chest  10/10/2012.  FINDINGS: Trachea is midline. Heart size normal. Probable pleural parenchymal scarring in the lower hemithoraces bilaterally. Lungs appear mildly hyperinflated but otherwise clear.  IMPRESSION: 1. Favor pleural parenchymal scarring at the bases of both hemithoraces. 2. Hyperinflation without acute findings.   Electronically Signed   By: Leanna Battles M.D.   On: 12/04/2012 09:58    EKG: Independently reviewed A. fib with right bundle branch block.  Assessment/Plan Principal Problem:   Acute renal failure: Likely related to dehydration secondary to anorexia from recent nausea. Will admit to telemetry. Will hold any nephrotoxins. Will gently hydrate with intravenous fluids, but with caution given his recent hospitalization for acute congestive heart failure.. Will monitor his intake and output. Will recheck the renal panel in the a.m.   Active Problems:  Abdominal  pain, mostly left lower quadrant and left flank area: Etiology unclear. His lipase, liver transaminases, and urinalysis are all unremarkable. His pain is not associated with his chronic ventral hernia. Pain focused on left flank area. Tenderness focus to the left flank area as well. CT of the abdomen pelvis yields no hydronephrosis or renal calculi. Of note, he reports falling approximately one month ago at home and thought that he had fractured some ribs. He has had intermittent pain on the left side since that time. He also ambulates with a walker, but denies that it causes left-sided abdominal pain. His pain will be treated with analgesics as needed. We'll monitor intake and output.    Nausea alone: Etiology unclear. His nausea is subsiding. We'll order a clear liquid diet and then advance as tolerated. No vomiting over the last 24 hours. Provide Zofran as needed    HTN (hypertension): Stable. Home medications include Lasix, lisinopril, Lopressor. Will hold Lasix and lisinopril secondary to #1. Monitor blood pressure  closely.    History of Colon cancer: Patient reports he is scheduled for a followup visit in December. No recent treatments.    History of pulmonary embolism/DVT. He is on Lovenox chronically, he will be continued.    Chronic Leukopenia: Chart review indicates current WBC is at his baseline.    Type 2 diabetes mellitus: Diet controlled. We will check his hemoglobin A1c and use sliding scale insulin for optimal glycemic control. Given his relatively normal capillary blood glucose this afternoon, will change the sliding scale to "sensitive" and will not cover each bedtime blood sugars to avoid symptomatic hypoglycemia in a patient with marginal oral intake. We'll advance his diet to full liquids as tolerated.    Unspecified hypothyroidism: Recent TSH was within the limits of normal. Will continue his home Synthroid    Chronic diastolic congestive heart failure: Recent hospitalization for acute on chronic diastolic heart failure. Patient seen by cardiology at that time and medications were adjusted. Patient appears compensated at this time. Will continue his Lopressor with parameters. Will monitor intake and output and obtain a daily weights.    Code Status: Full  Family Communication: none present  Disposition Plan: home when ready  Time spent: 75 minutes  Gwenyth Bender [facility] Pager [phone]  If 7PM-7AM, please contact night-coverage [email or web address] Password [facility] 12/04/2012, 2:58 PM     Attending: The patient was seen and examined. He was discussed with nurse practitioner, Ms. Vedia Coffer. The above note has been amended or annotated. Agree with history, exam, assessment and plan. The patient's pain appears to be located primarily at the left lower quadrant and left flank area. Musculoskeletal pain is within the differential diagnoses, given a recent fall at home. However, at that time, he reports rib fractures, but I see no radiographic evidence of this. He denies any recent  heavy lifting or pulling or tugging, but he does ambulate with a walker and has difficulty getting out of bed which he says can cause some straining at times. He is obviously volume depleted and this has led to acute renal failure. He will be cautiously hydrated as stated above.   Elliot Cousin, M.D. [phone]

## 2012-12-04 NOTE — Progress Notes (Signed)
Pt has no PCP listed in computer, but states he goes to [facility] for care

## 2012-12-04 NOTE — Progress Notes (Deleted)
ED/CM noted patient did not have health insurance and/or PCP listed in the computer.  Patient was given the [place] resource handout with information on the clinics, food pantries, and the handout for new health insurance sign-up.  Patient expressed appreciation for this. 

## 2012-12-04 NOTE — Progress Notes (Signed)
Patients foley that was placed in the ED removed at approximately 1600.  Not needed, patient states he is continent and wished to have removed.  Awaiting patient to void in urinal.  To have bladder scan to check post void residual after first void.  Passed this on the the night RN as patient has not voided before end of first shift.

## 2012-12-05 ENCOUNTER — Encounter (HOSPITAL_COMMUNITY): Payer: Self-pay | Admitting: Internal Medicine

## 2012-12-05 DIAGNOSIS — Z9049 Acquired absence of other specified parts of digestive tract: Secondary | ICD-10-CM | POA: Diagnosis not present

## 2012-12-05 DIAGNOSIS — I5032 Chronic diastolic (congestive) heart failure: Secondary | ICD-10-CM | POA: Diagnosis present

## 2012-12-05 DIAGNOSIS — E119 Type 2 diabetes mellitus without complications: Secondary | ICD-10-CM | POA: Diagnosis present

## 2012-12-05 DIAGNOSIS — E440 Moderate protein-calorie malnutrition: Secondary | ICD-10-CM | POA: Diagnosis present

## 2012-12-05 DIAGNOSIS — N200 Calculus of kidney: Secondary | ICD-10-CM

## 2012-12-05 DIAGNOSIS — Z9181 History of falling: Secondary | ICD-10-CM | POA: Diagnosis not present

## 2012-12-05 DIAGNOSIS — Z85038 Personal history of other malignant neoplasm of large intestine: Secondary | ICD-10-CM | POA: Diagnosis not present

## 2012-12-05 DIAGNOSIS — N179 Acute kidney failure, unspecified: Secondary | ICD-10-CM | POA: Diagnosis present

## 2012-12-05 DIAGNOSIS — N281 Cyst of kidney, acquired: Secondary | ICD-10-CM

## 2012-12-05 DIAGNOSIS — E7800 Pure hypercholesterolemia, unspecified: Secondary | ICD-10-CM | POA: Diagnosis present

## 2012-12-05 DIAGNOSIS — Z86718 Personal history of other venous thrombosis and embolism: Secondary | ICD-10-CM | POA: Diagnosis not present

## 2012-12-05 DIAGNOSIS — D708 Other neutropenia: Secondary | ICD-10-CM | POA: Diagnosis present

## 2012-12-05 DIAGNOSIS — Z87891 Personal history of nicotine dependence: Secondary | ICD-10-CM | POA: Diagnosis not present

## 2012-12-05 DIAGNOSIS — I4891 Unspecified atrial fibrillation: Secondary | ICD-10-CM | POA: Diagnosis present

## 2012-12-05 DIAGNOSIS — I509 Heart failure, unspecified: Secondary | ICD-10-CM | POA: Diagnosis present

## 2012-12-05 DIAGNOSIS — Z23 Encounter for immunization: Secondary | ICD-10-CM | POA: Diagnosis not present

## 2012-12-05 DIAGNOSIS — Z86711 Personal history of pulmonary embolism: Secondary | ICD-10-CM | POA: Diagnosis not present

## 2012-12-05 DIAGNOSIS — I10 Essential (primary) hypertension: Secondary | ICD-10-CM | POA: Diagnosis present

## 2012-12-05 DIAGNOSIS — E039 Hypothyroidism, unspecified: Secondary | ICD-10-CM | POA: Diagnosis present

## 2012-12-05 DIAGNOSIS — I2510 Atherosclerotic heart disease of native coronary artery without angina pectoris: Secondary | ICD-10-CM | POA: Diagnosis present

## 2012-12-05 DIAGNOSIS — R109 Unspecified abdominal pain: Secondary | ICD-10-CM | POA: Diagnosis present

## 2012-12-05 DIAGNOSIS — Z79899 Other long term (current) drug therapy: Secondary | ICD-10-CM | POA: Diagnosis not present

## 2012-12-05 DIAGNOSIS — E860 Dehydration: Secondary | ICD-10-CM | POA: Diagnosis present

## 2012-12-05 LAB — GLUCOSE, CAPILLARY
Glucose-Capillary: 84 mg/dL (ref 70–99)
Glucose-Capillary: 108 mg/dL — ABNORMAL HIGH (ref 70–99)
Glucose-Capillary: 97 mg/dL (ref 70–99)

## 2012-12-05 LAB — BASIC METABOLIC PANEL
CO2: 30 mEq/L (ref 19–32)
Calcium: 9.7 mg/dL (ref 8.4–10.5)
Chloride: 101 mEq/L (ref 96–112)
GFR calc Af Amer: 44 mL/min — ABNORMAL LOW (ref >90)
Potassium: 3.7 mEq/L (ref 3.5–5.1)
Sodium: 140 mEq/L (ref 135–145)

## 2012-12-05 LAB — CBC
HCT: 34.7 % — ABNORMAL LOW (ref 39.0–52.0)
Hemoglobin: 11.5 g/dL — ABNORMAL LOW (ref 13.0–17.0)
MCH: 31.9 pg (ref 26.0–34.0)
MCV: 96.4 fL (ref 78.0–100.0)
RBC: 3.6 MIL/uL — ABNORMAL LOW (ref 4.22–5.81)
RDW: 12.8 % (ref 11.5–15.5)
WBC: 3.3 10*3/uL — ABNORMAL LOW (ref 4.0–10.5)

## 2012-12-05 MED ORDER — FAMOTIDINE 20 MG PO TABS
20.0000 mg | ORAL_TABLET | Freq: Two times a day (BID) | ORAL | Status: DC
  Administered 2012-12-05 (×2): 20 mg via ORAL
  Filled 2012-12-05 (×3): qty 1

## 2012-12-05 MED ORDER — SODIUM CHLORIDE 0.9 % IV SOLN: INTRAVENOUS | Status: DC

## 2012-12-05 MED ORDER — SODIUM CHLORIDE 0.9 % IV SOLN
INTRAVENOUS | Status: AC
  Administered 2012-12-05: 11:00:00 via INTRAVENOUS

## 2012-12-05 NOTE — Plan of Care (Signed)
Problem: Phase I Progression Outcomes Goal: OOB as tolerated unless otherwise ordered Outcome: Completed/Met Date Met:  12/05/12 Pt up frequently to BR d/t loose stool, offered pt chair to sit up, but declines at this time.

## 2012-12-05 NOTE — Evaluation (Signed)
Physical Therapy Evaluation Patient Details Name: Bryce Mcdonald MRN: [id number] DOB: October 21, 1921 Today's Date: 12/05/2012 Time: 1478-2956 PT Time Calculation (min): 29 min  PT Assessment / Plan / Recommendation History of Present Illness  Pt is admitted for abdominal pain and weakness.  He lives alone with family nearby.  He is independent with ADLs at baseline and ambulates with a walker.  He was recently admitted for CHF and it was determined at that time that he requires supplemental O2 during exertion.  He has a hx of CHF, DM, HTN, CAD, Afib and DVT.  Clinical Impression   Pt is seen for evaluation and found to be deconditioned/generally weak as compared to baseline.  Having been ill for the past week  he is not able to currently function independently in a home setting and would be at high risk of fall.  If he has not improved to baseline at the time of discharge, I would recommend SNF at d/c.  PT Assessment  Patient needs continued PT services    Follow Up Recommendations  SNF    Does the patient have the potential to tolerate intense rehabilitation      Barriers to Discharge Decreased caregiver support lives alone    Equipment Recommendations  None recommended by PT    Recommendations for Other Services     Frequency Min 3X/week    Precautions / Restrictions Precautions Precautions: Fall Restrictions Weight Bearing Restrictions: No   Pertinent Vitals/Pain       Mobility  Bed Mobility Supine to Sit: 4: Min guard;HOB elevated Sit to Supine: 4: Min guard;HOB elevated Transfers Transfers: Stand to Sit Sit to Stand: 4: Min guard;From bed Stand to Sit: 4: Min guard;To bed Ambulation/Gait Ambulation/Gait Assistance: 4: Min assist Ambulation Distance (Feet): 20 Feet Assistive device: Rolling walker Gait Pattern: Trunk flexed;Shuffle Gait velocity: slow General Gait Details: very kyphotic posture, pt very deconditioned Stairs: No Wheelchair  Mobility Wheelchair Mobility: No    Exercises     PT Diagnosis: Difficulty walking;Generalized weakness  PT Problem List: Decreased strength;Decreased activity tolerance;Decreased mobility PT Treatment Interventions: Gait training;Functional mobility training;Therapeutic exercise     PT Goals(Current goals can be found in the care plan section) Acute Rehab PT Goals Patient Stated Goal: return home to independence Time For Goal Achievement: 12/19/12 Potential to Achieve Goals: Good  Visit Information  Last PT Received On: 12/05/12 History of Present Illness: Pt is admitted for abdominal pain and weakness.  He lives alone with family nearby.  He is independent with ADLs at baseline and ambulates with a walker.  He was recently admitted for CHF and it was determined at that time that he requires supplemental O2 during exertion.  He has a hx of CHF, DM, HTN, CAD, Afib and DVT.       Prior Functioning  Home Living Family/patient expects to be discharged to:: Skilled nursing facility    Cognition  Cognition Arousal/Alertness: Awake/alert Behavior During Therapy: WFL for tasks assessed/performed Overall Cognitive Status: Within Functional Limits for tasks assessed    Extremity/Trunk Assessment Lower Extremity Assessment Lower Extremity Assessment: Generalized weakness   Balance Balance Balance Assessed: No  End of Session PT - End of Session Equipment Utilized During Treatment: Gait belt Activity Tolerance: Patient limited by fatigue;Other (comment) (limited by nausea) Patient left: in bed;with call bell/phone within reach;with bed alarm set  GP Functional Assessment Tool Used: clinical judgement Functional Limitation: Mobility: Walking and moving around Mobility: Walking and Moving Around Current Status (O1308): At least 20 percent  but less than 40 percent impaired, limited or restricted Mobility: Walking and Moving Around Goal Status [phone]): At least 1 percent but less than 20  percent impaired, limited or restricted   Konrad Penta 12/05/2012, 10:43 AM

## 2012-12-05 NOTE — Progress Notes (Signed)
[facility] PROGRESS NOTE  Ocie Tino ZOX:[id number] DOB: [birth year] DOA: 12/04/2012 PCP: Provider Not In System  Assessment/Plan: Principal Problem:  Acute renal failure: Likely related to dehydration secondary to anorexia from recent nausea. Improving. Creatinine trending down. Urine output good.  Will continue to hold any nephrotoxins.  Will recheck the renal panel in the a.m.  Active Problems:   Abdominal pain, mostly left lower quadrant and left flank area: Etiology unclear. Reports no pain this morning or during the night.  His lipase, liver transaminases, and urinalysis are all unremarkable. His pain is not associated with his chronic ventral hernia. Pain focused on left flank area. Tenderness focus to the left flank area as well. CT of the abdomen pelvis yields no hydronephrosis or renal calculi. Renal US yields bilateral renal cyst likely chronic and left non-obstructing kidney calculi. Differential includes renal colic as well as musculoskeletal given fall one month ago. Will request PT evaluation. Will continue to treated his pain with analgesics as needed. We'll monitor intake and output.   Nausea alone:  Continues with nausea. Denies dizziness/vertigo. Patient very dry on admission. May be related to continued dehydration. Continue full liquids. Resume very gentle hydration cautiously given hx chronic diastolic HF. NS 50cc/hr for 8 hours. Will check orthostatics. Continue zofran  Frequent stools: denies "watery" stool but reports several BMs. Will monitor closely. Will check stool for cdiff and GI pathogens. Patient afebrile.   HTN (hypertension): controlled. Home medications include Lasix, lisinopril, Lopressor. Continue to hold Lasix and lisinopril secondary to #1. Monitor blood pressure closely.   History of Colon cancer: Patient reports he is scheduled for a followup visit in December. No recent treatments.   History of pulmonary embolism/DVT. He is on Lovenox  chronically, he will be continued.   Chronic Leukopenia: Chart review indicates current WBC is at his baseline.   Type 2 diabetes mellitus: Diet controlled. CBG range 93-84.  A1c 6.7 8/14.  Continue sensative sliding scale insulin for optimal glycemic control.    Unspecified hypothyroidism: Recent TSH was within the limits of normal. Will continue his home Synthroid   Chronic diastolic congestive heart failure: Recent hospitalization for acute on chronic diastolic heart failure. Patient seen by cardiology at that time and medications were adjusted. Patient compensated at this time. Will continue his Lopressor with parameters. Will monitor intake and output and obtain a daily weights.   Code Status: full Family Communication: none available Disposition Plan: home hopefully tomorrow.    Consultants:  none  Procedures:  none  Antibiotics:  none  HPI/Subjective: Lying in bed awake alert. Reports feeling "whole lot better. No pain in side"  Objective: Filed Vitals:   12/05/12 0916  BP: 128/60  Pulse: 94  Temp:   Resp:    temperature 98.2. Respiratory rate 19.    Intake/Output Summary (Last 24 hours) at 12/05/12 0930 Last data filed at 12/05/12 0919  Gross per 24 hour  Intake    480 ml  Output   1000 ml  Net   -520 ml   Filed Weights   12/04/12 1428  Weight: 76.204 kg (168 lb)    Exam:   General:  Less ill appearing. No acute distress  Cardiovascular: S1 and S2. Heart sounds distant. No lower extremity edema  Respiratory: Normal effort. Breath sounds distant but clear bilaterally. In hear no wheeze or crackles  Abdomen: soft . Moderately large ventral hernia right of center, non-tender, soft. Bowel sounds present throughout. Mild tenderness to palpation in left flank and left  lower quadrant. No guarding  Musculoskeletal: no clubbing or cyanosis. Joints without swelling/erythema. Full range of motion.   Data Reviewed: Basic Metabolic Panel:  Recent  Labs Lab 12/04/12 0935 12/05/12 0527  NA 136 140  K 3.9 3.7  CL 97 101  CO2 28 30  GLUCOSE 127* 100*  BUN 46* 36*  CREATININE 2.25* 1.56*  CALCIUM 10.1 9.7   Liver Function Tests:  Recent Labs Lab 12/04/12 0935  AST 13  ALT 5  ALKPHOS 68  BILITOT 0.3  PROT 7.1  ALBUMIN 3.9    Recent Labs Lab 12/04/12 0935  LIPASE 52   No results found for this basename: AMMONIA,  in the last 168 hours CBC:  Recent Labs Lab 12/04/12 0935 12/05/12 0527  WBC 3.3* 3.3*  NEUTROABS 1.1*  --   HGB 12.6* 11.5*  HCT 36.9* 34.7*  MCV 94.4 96.4  PLT 187 157   Cardiac Enzymes: No results found for this basename: CKTOTAL, CKMB, CKMBINDEX, TROPONINI,  in the last 168 hours BNP (last 3 results)  Recent Labs  09/29/12 2348 11/10/12 2136  PROBNP 2067.0* 1765.0*   CBG:  Recent Labs Lab 12/04/12 1408 12/04/12 1717 12/04/12 2024 12/05/12 0734  GLUCAP 83 93 93 84    No results found for this or any previous visit (from the past 240 hour(s)).   Studies: Ct Abdomen Pelvis Wo Contrast  12/04/2012   CLINICAL DATA:  Left flank pain with nausea and fever. History of hypertension, diabetes and colon cancer.  EXAM: CT ABDOMEN AND PELVIS WITHOUT CONTRAST  TECHNIQUE: Multidetector CT imaging of the abdomen and pelvis was performed following the standard protocol without intravenous contrast.  COMPARISON:  Abdominal pelvic CT 12/20/2008.  FINDINGS: Images through the lung bases demonstrate stable scarring and subpleural nodularity bilaterally. There is slightly increased patchy opacity in the lingula on images 6-9. There is a new small pericardial effusion. No significant pleural fluid is present. A small hiatal hernia is noted.  There is no evidence of urinary tract calculus or hydronephrosis. Renovascular calcifications and left renal cysts are stable. There is stable mild nodularity of both adrenal glands. The liver, pancreas and spleen appear unremarkable. There are possible small  gallstones layering in the gallbladder lumen.  There are stable postsurgical changes status post partial colon resection. There is a broad-based ventral hernia containing bowel. There is no evidence of bowel incarceration or obstruction. No inflammatory changes or enlarged lymph nodes are seen. The appendix appears normal. There are diffuse vascular calcifications, including calcifications within the lumen of both common iliac veins, unchanged. Linear calcifications in the right retroperitoneum are unchanged, likely postsurgical.  The urinary bladder is decompressed by a Foley catheter. There is possible mild bladder wall thickening.  Moderate arthropathic changes of both hips and diffuse lower lumbar spine facet disease are noted.  IMPRESSION: 1. No specific explanation for left flank pain. No evidence of urinary tract calculus or hydronephrosis. 2. New patchy opacity in the lingula and small pericardial effusion. Bibasilar scarring is otherwise unchanged. 3. Stable postsurgical changes related to partial colon resection with linear calcifications in the right retroperitoneum. No evidence of metastatic disease. 4. Stable ventral hernia. No evidence of bowel obstruction or incarceration.   Electronically Signed   By: Roxy Horseman M.D.   On: 12/04/2012 11:48   US Renal  12/04/2012   CLINICAL DATA:  Flank pain, urinary retention, history colon cancer, coronary artery disease, hypertension, diabetes, history DVT and PE  EXAM: RENAL/URINARY TRACT ULTRASOUND COMPLETE  COMPARISON:  None.  FINDINGS: Right Kidney  Length: 9.6 cm. Normal cortical thickness. Increased cortical echogenicity. Small cyst at mid pole 10 x 13 x 9 mm. No solid mass or hydronephrosis. No definite shadowing calcification.  Left Kidney  Length: 11.0 cm length. Normal cortical thickness. Increased cortical echogenicity. Multiple cysts largest measuring 19 x 20 x 25 mm at upper pole and 21 x 25 x 19 mm at mid kidney. No solid mass identified. Small  echogenic focus at mid kidney without definite shadowing, cannot completely exclude a 7 mm calculus. No hydronephrosis.  Bladder  Appears normal for degree of bladder distention.  IMPRESSION: Medical renal disease changes with echogenic renal cortices bilaterally.  Bilateral renal cysts.  Question 7 mm nonobstructing calculus left kidney.   Electronically Signed   By: Ulyses Southward M.D.   On: 12/04/2012 18:58   Dg Chest Port 1 View  12/04/2012   CLINICAL DATA:  Shortness of breath.  EXAM: PORTABLE CHEST - 1 VIEW  COMPARISON:  11/10/2012 and CT chest 10/10/2012.  FINDINGS: Trachea is midline. Heart size normal. Probable pleural parenchymal scarring in the lower hemithoraces bilaterally. Lungs appear mildly hyperinflated but otherwise clear.  IMPRESSION: 1. Favor pleural parenchymal scarring at the bases of both hemithoraces. 2. Hyperinflation without acute findings.   Electronically Signed   By: Leanna Battles M.D.   On: 12/04/2012 09:58    Scheduled Meds: . enoxaparin  100 mg Subcutaneous BID  . influenza vac split quadrivalent PF  0.5 mL Intramuscular Tomorrow-1000  . insulin aspart  0-9 Units Subcutaneous TID WC  . isosorbide mononitrate  30 mg Oral Daily  . levothyroxine  50 mcg Oral QAC breakfast  . pantoprazole (PROTONIX) IV  40 mg Intravenous Q12H  . sodium chloride  3 mL Intravenous Q12H  . terazosin  1 mg Oral QHS   Continuous Infusions:   Principal Problem:   Abdominal pain, left lower quadrant Active Problems:   HTN (hypertension)   History of pulmonary embolism   Leukopenia   Type 2 diabetes mellitus   Unspecified hypothyroidism   Chronic diastolic congestive heart failure   Acute renal failure   Nausea alone   History of colon cancer   Calculus of left kidney   Bilateral renal cysts    Time spent: 35 minuts    [facility] M  [facility] Pager [phone]. If 7PM-7AM, please contact night-coverage at [email or web address], password [facility] 12/05/2012, 9:30 AM  LOS: 1 day        Attending: The patient was seen and examined. He was discussed with nurse practitioner, Ms. Vedia Coffer. Agree with the findings above. We'll continue to provide gentle IV fluids and monitor his renal function. We'll also order post void residuals to make sure he does not have an issue with urinary retention. His renal ultrasound was significant for bilateral renal cysts, medical renal disease, and the left kidney calculus. Given these findings, he may have mild underlying chronic kidney disease that may be progressing, but with an acute element from dehydration.    Elliot Cousin, M.D. [phone]

## 2012-12-05 NOTE — Progress Notes (Signed)
UR Chart Review Completed  

## 2012-12-05 NOTE — Clinical Social Work Psychosocial (Signed)
Clinical Social Work Department BRIEF PSYCHOSOCIAL ASSESSMENT 12/05/2012  Patient:  Bryce Mcdonald, Bryce Mcdonald     Account Number:  [id number]     Admit date:  12/04/2012  Clinical Social Worker:  Nancie Neas  Date/Time:  12/05/2012 11:25 AM  Referred by:  CSW  Date Referred:  12/05/2012 Referred for  SNF Placement   Other Referral:   Interview type:  Patient Other interview type:    PSYCHOSOCIAL DATA Living Status:  ALONE Admitted from facility:   Level of care:   Primary support name:  John Primary support relationship to patient:  CHILD, ADULT Degree of support available:   supportive    CURRENT CONCERNS  Other Concerns:    SOCIAL WORK ASSESSMENT / PLAN CSW met with pt at bedside for SNF placement. Pt alert and oriented and reports he lives at home alone. His son, Bryce Mcdonald lives close by and is able to check on him frequently. Pt ambulates with walker and is independent with ADLs.  Pt states his son took his car because he is no longer able to drive. Pt evaluated by PT today and recommendation is for SNF. CSW discussed placement process, but he was not interested in SNF. CSW asked if Bryce Mcdonald could stay with him if he went home and he states that Bryce Mcdonald is welcome to come help him some, but would not be staying with him by his own choosing. Pt said Bryce Mcdonald is available at any time if needed though. He was open to home health and a lifeline. He reports that when he gets home he will be fine.   Assessment/plan status:  Information/Referral to [facility] Other assessment/ plan:   Information/referral to community resources:   SNF placement and lifeline information    PATIENT'S/FAMILY'S RESPONSE TO PLAN OF CARE: Pt not interested in SNF placement. Interested in lifeline and has had home health in the past and open to that as well. SNF list and [facility] information left in room. CM notified of above. CSW signing off but can be reconsulted if needed.       Derenda Fennel,  [place] [phone]

## 2012-12-05 NOTE — Care Management Note (Signed)
    Page 1 of 2   12/06/2012     2:48:48 PM   CARE MANAGEMENT NOTE 12/06/2012  Patient:  Bryce Mcdonald, Bryce Mcdonald   Account Number:  [id number]  Date Initiated:  12/05/2012  Documentation initiated by:  Rosemary Holms  Subjective/Objective Assessment:   Pt admitted from home where he lives alone. CM inquired to pt if he wishes to transfer to a [place] hospital, he stated that they do not take good care of pts and he would die there. Pt requests CM return to room to discuss [facility] trasfer form with     Action/Plan:   his daughter when she get to the hospital.   Anticipated DC Date:  12/07/2012   Anticipated DC Plan:  HOME W HOME HEALTH SERVICES  In-house referral  Clinical Social Worker      DC Planning Services  CM consult      Choice offered to / List presented to:             Status of service:  Completed, signed off Medicare Important Message given?   (If response is "NO", the following Medicare IM given date fields will be blank) Date Medicare IM given:   Date Additional Medicare IM given:    Discharge Disposition:  HOME W HOME HEALTH SERVICES  Per UR Regulation:    If discussed at Long Length of Stay Meetings, dates discussed:    Comments:  12/06/12 1445 Arlyss Queen, RN BSN CM Pt discharged home today with [facility] RN and PT. Orders faxed to [facility] to PCP can arrange [facility]. Per Dr. Dillard Cannon off ice staff, [facility] cannot be arranged until Monday, Oct 20 since PCP is out of office. Toya Smothers, NP and pts daughter, and pts nurse aware of HH cannot be arranged until Monday. No other CM needs noted.    12/05/12 Amy Robson RN BNS CM Pt declines SNF referral. CSW provided pt with [facility] information. CM left contact informtion with pt for daughter to call [phone] CM spoke to [place], pt's sister. States pt is upset about being transferred. CM assured Ola that [facility] does not wish to transfer but we must alert the [facility] to the admission. Ola spoke to pt, pt refuses transfer. CM had pt repeat that if he signed to  refuse transfer the [place] may not pay. Transfer form signed by daughter and witness by another family member and CM. Faxed to [place]. Left message with [facility] to let CM know of receipt.

## 2012-12-05 NOTE — Progress Notes (Signed)
INITIAL NUTRITION ASSESSMENT  DOCUMENTATION CODES Per approved criteria  -Moderate malnutrition in the context of acute illness    Pt meets criteria for Moderate (non-severe) MALNUTRITION in the context of  Acute illness as evidenced by 12#,7% wt loss and mild-moderate muscle wasting, fat depletion.  INTERVENTION: Diet advancment per MD Ensure Complete po BID, each supplement provides 350 kcal and 13 grams of protein. RD will follow for nutrition care  NUTRITION DIAGNOSIS: Inadequate oral intake related to anorexia, nausea as evidenced by acute wt loss and dehydration on admission.   Goal: Pt to meet >/= 90% of their estimated nutrition needs   Monitor:  Fluid and food intake as diet is advanced, weight changes and labs  Reason for Assessment: Malnutrition Screen Score = 3  79 y.o. male  Admitting Dx: Abdominal pain, left lower quadrant  ASSESSMENT: Acute unintentional weight loss of 12#, 7% <30 days.C/o abdominal pain on admission and reports "no appetite". Hx includes CHF, Dysphagia, colon cancer. He meets criteria for malnutrition (non-severe).  Patient Active Problem List   Diagnosis Date Noted  . Calculus of left kidney 12/05/2012  . Bilateral renal cysts 12/05/2012  . Chronic diastolic congestive heart failure 12/04/2012  . Acute renal failure 12/04/2012  . Nausea alone 12/04/2012  . History of colon cancer 12/04/2012  . Abdominal pain, left lower quadrant 12/04/2012  . Heart failure 11/11/2012  . LVH (left ventricular hypertrophy) 10/11/2012  . DVT, bilateral lower limbs 10/10/2012  . Dysphagia 10/09/2012  . History of pulmonary embolism 10/09/2012  . Chronic anticoagulation 10/09/2012  . Leukopenia 10/09/2012  . Type 2 diabetes mellitus 10/09/2012  . Right bundle branch block 10/09/2012  . Unspecified hypothyroidism 10/09/2012  . Anemia 10/09/2012  . Atrial fibrillation with slow ventricular response 09/30/2012  . UTI (lower urinary tract infection)  10/16/2010  . HTN (hypertension) 10/16/2010  . Prostate cancer 10/16/2010  . Hyperlipidemia 10/16/2010  . Ventral hernia 10/16/2010   Nutrition Focused Physical Exam:  Subcutaneous Fat:  Orbital Region: WNL Upper Arm Region: Mild depletion Thoracic and Lumbar Region: Mild depletion  Muscle:  Temple Region: mild wasting Clavicle Bone Region: moderate wasting Clavicle and Acromion Bone Region: mild Scapular Bone Region: WNL Dorsal Hand: WNL Patellar Region: moderate Anterior Thigh Region: mild Posterior Calf Region: mild   Edema: none    Height: Ht Readings from Last 1 Encounters:  12/04/12 5\' 8"  (1.727 m)    Weight: Wt Readings from Last 1 Encounters:  12/04/12 168 lb (76.204 kg)    Ideal Body Weight: 154# (70kg)  % Ideal Body Weight: 109%  Wt Readings from Last 10 Encounters:  12/04/12 168 lb (76.204 kg)  11/14/12 180 lb 5.4 oz (81.8 kg)  10/25/12 180 lb (81.647 kg)  10/11/12 177 lb 7.5 oz (80.5 kg)  09/30/12 177 lb 4 oz (80.4 kg)  07/09/11 188 lb (85.276 kg)  06/18/11 188 lb (85.276 kg)  06/14/11 189 lb (85.73 kg)  10/16/10 184 lb 11.9 oz (83.8 kg)    Usual Body Weight: 180-185#  % Usual Body Weight: 93%  BMI:  Body mass index is 25.55 kg/(m^2).overweight  Estimated Nutritional Needs: Kcal: 1900-2200 Protein: 75-85 gr Fluid: 1.9-2.2 liters per day  Skin: No issues noted  Diet Order: Full Liquid  EDUCATION NEEDS: -Education not appropriate at this time   Intake/Output Summary (Last 24 hours) at 12/05/12 1233 Last data filed at 12/05/12 0919  Gross per 24 hour  Intake    480 ml  Output    650 ml  Net   -170 ml    Last BM: 12/05/12   Labs:   Recent Labs Lab 12/04/12 0935 12/05/12 0527  NA 136 140  K 3.9 3.7  CL 97 101  CO2 28 30  BUN 46* 36*  CREATININE 2.25* 1.56*  CALCIUM 10.1 9.7  GLUCOSE 127* 100*    CBG (last 3)   Recent Labs  12/04/12 2024 12/05/12 0734 12/05/12 1106  GLUCAP 93 84 108*    Scheduled  Meds: . enoxaparin  100 mg Subcutaneous BID  . influenza vac split quadrivalent PF  0.5 mL Intramuscular Tomorrow-1000  . insulin aspart  0-9 Units Subcutaneous TID WC  . isosorbide mononitrate  30 mg Oral Daily  . levothyroxine  50 mcg Oral QAC breakfast  . pantoprazole (PROTONIX) IV  40 mg Intravenous Q12H  . sodium chloride  3 mL Intravenous Q12H  . terazosin  1 mg Oral QHS    Continuous Infusions: . sodium chloride 50 mL/hr at 12/05/12 1041    Past Medical History  Diagnosis Date  . Colon cancer     Status post partial colectomy  . Hypertension   . Hypercholesterolemia   . Blood transfusion   . Coronary artery disease 2011    Non obstructive CAD  . DVT (deep venous thrombosis)   . PE (pulmonary embolism)   . Diabetes mellitus without complication   . A-fib   . Diabetes mellitus, type II   . Ventral hernia   . Right bundle branch block   . Unspecified hypothyroidism   . LVH (left ventricular hypertrophy) 10/10/2012    Ejection fraction 60-65%.  . Bilateral renal cysts   . Calculus of left kidney     non-obstructing    Past Surgical History  Procedure Laterality Date  . Abdominal surgery      x 3  . Cardiac catheterization  2008    Royann Shivers MS,RD,LDN,CSG Office: #[phone] Pager: [phone]

## 2012-12-06 ENCOUNTER — Inpatient Hospital Stay (HOSPITAL_COMMUNITY): Payer: Non-veteran care

## 2012-12-06 DIAGNOSIS — E440 Moderate protein-calorie malnutrition: Secondary | ICD-10-CM | POA: Diagnosis present

## 2012-12-06 DIAGNOSIS — E860 Dehydration: Secondary | ICD-10-CM

## 2012-12-06 DIAGNOSIS — R197 Diarrhea, unspecified: Secondary | ICD-10-CM | POA: Diagnosis not present

## 2012-12-06 LAB — BASIC METABOLIC PANEL
BUN: 29 mg/dL — ABNORMAL HIGH (ref 6–23)
CO2: 28 mEq/L (ref 19–32)
Calcium: 9.4 mg/dL (ref 8.4–10.5)
GFR calc Af Amer: 52 mL/min — ABNORMAL LOW (ref >90)
GFR calc non Af Amer: 45 mL/min — ABNORMAL LOW (ref >90)
Glucose, Bld: 104 mg/dL — ABNORMAL HIGH (ref 70–99)
Sodium: 136 mEq/L (ref 135–145)

## 2012-12-06 LAB — GLUCOSE, CAPILLARY
Glucose-Capillary: 78 mg/dL (ref 70–99)
Glucose-Capillary: 97 mg/dL (ref 70–99)

## 2012-12-06 LAB — CBC
HCT: 31.7 % — ABNORMAL LOW (ref 39.0–52.0)
Hemoglobin: 10.6 g/dL — ABNORMAL LOW (ref 13.0–17.0)
MCHC: 33.4 g/dL (ref 30.0–36.0)
RBC: 3.33 MIL/uL — ABNORMAL LOW (ref 4.22–5.81)
RDW: 13.1 % (ref 11.5–15.5)

## 2012-12-06 LAB — HEPARIN ANTI-XA: Heparin LMW: 1.41 IU/mL

## 2012-12-06 MED ORDER — ALUM & MAG HYDROXIDE-SIMETH 200-200-20 MG/5ML PO SUSP: 30.0000 mL | Freq: Four times a day (QID) | ORAL | Status: DC | PRN

## 2012-12-06 MED ORDER — FAMOTIDINE 20 MG PO TABS: 20.0000 mg | ORAL_TABLET | Freq: Two times a day (BID) | ORAL | Status: AC

## 2012-12-06 MED ORDER — ENOXAPARIN SODIUM 100 MG/ML ~~LOC~~ SOLN
120.0000 mg | SUBCUTANEOUS | Status: DC
  Administered 2012-12-06: 120 mg via SUBCUTANEOUS
  Filled 2012-12-06: qty 2

## 2012-12-06 MED ORDER — LISINOPRIL 10 MG PO TABS: ORAL_TABLET | ORAL | Status: DC

## 2012-12-06 NOTE — Discharge Summary (Signed)
Physician Discharge Summary  Bryce Mcdonald WNU:[id number] DOB: Jan 11, 1922 DOA: 12/04/2012  PCP: Provider Not In System  Admit date: 12/04/2012 Discharge date: 12/06/2012  Time spent: 40 minutes  Recommendations for Outpatient Follow-up:  1. Has appointment with PCP at [facility] 12/18/12. Recommend BMET for evaluation of renal function and potassium level.  2. Home Health PT for strength and endurance. RN for evaluation/assessment of condition  Discharge Diagnoses:  Principal Problem:  Acute renal failure   Active Problems: Abdominal pain, left lower quadrant   Nausea alone diarrhea   HTN (hypertension)   History of pulmonary embolism   Leukopenia:chronic   Type 2 diabetes mellitus   Unspecified hypothyroidism   Chronic diastolic congestive heart failure    History of colon cancer   Calculus of left kidney   Bilateral renal cysts   Malnutrition of moderate degree   Discharge Condition: stable and ready for discharge to home  Diet recommendation: carb modified   Filed Weights   12/04/12 1428 12/06/12 0453  Weight: 76.204 kg (168 lb) 80.468 kg (177 lb 6.4 oz)    History of present illness:  Bryce Mcdonald is a 79 y.o. male with past medical history that includes chronic diastolic heart failure, diabetes, hypertension, CAD, A. fib, DVT, who presented to the emergency room on 12/04/12 with chief complaint of abdominal pain and weakness. Patient indicated that 3 days prior he developed sudden sharp abdominal pain mostly in the upper left quadrant radiating back to the left flank area. Associated symptoms included nausea without vomiting, poor appetite, dizziness with position change and shortness of breath. He denied chest pain, palpitations headache, visual disturbances syncope or near-syncope. He indicated that he had very little to eat or drink in the previous 3 days. He denied diarrhea, constipation, melena, or hematochezia. He did report some frequency with urination but  he denied dysuria or hematuria. Initial workup in the emergency department was significant for a creatinine of 2.25, WBC of 3.3, and hemoglobin of 12.6. Lipase and lactic acid were within the limits of normal. CT of the abdomen yielded "no specific explanation for left flank pain; No evidence of urinary tract calculus or hydronephrosis; New patchy opacity in the lingula and small pericardial effusion; Bibasilar scarring is otherwise unchanged; Stable postsurgical changes related to partial colon resection with linear calcifications in the right retroperitoneum; No evidence of metastatic disease; Stable ventral hernia; No evidence of bowel obstruction or Incarceration." Vital signs on admission were significant for a blood pressure of 90/50 and respiration rate of 26. Otherwise, he was afebrile and not tachycardic. In the emergency department he was given one liter of normal saline intravenously.    Hospital Course:  Acute renal failure: Likely related to dehydration secondary to anorexia from recent nausea. Admitted to tele. Was very gently hydrated with IV fluids. Nephrotoxins were held as well. At discharge creatinine within the limits of normal. Urine output good. At discharge lasix dose reduced to 20mg  daily and lisinopril dose reduced to 5mg  daily. Of note, at discharge BP somewhat labile at the low end of normal. Recommend close follow up with PCP for optimal BP control.  Active Problems:  Abdominal pain, mostly left lower quadrant and left flank area: Etiology somewhat uncertain. His lipase, liver transaminases, and urinalysis are all unremarkable. His pain is not associated with his chronic ventral hernia. Pain was focused on left flank area. Tenderness focus to the left flank area as well. CT of the abdomen pelvis yielded no hydronephrosis or renal calculi. Renal  US yielded bilateral renal cyst likely chronic and left non-obstructing kidney calculi. Differential includes renal colic as well as  musculoskeletal given fall one month ago. Pain resolved 12/05/12. Some concern for possible urine retention. Post void bladder scan yielded 40cc and 70cc. At discharge PT evaluation recommended HH PT .   Nausea alone: no vomiting during hospitalization. Pt provided with zofran. On 12/05/13 nausea resolved and pt reports  "i have no taste for anything". Nutritional consult recommending Ensure BID for malnutrition of moderate degree due to acute illness. Family reports patients appetite waxes and wanes at baseline.    Frequent stools: Several bowel movements on 10-/16/14. No hx antibiotics. Quickly resolved to point not able to get stool sample before discharge. Patient remained afebrile and non-toxic appearing.    HTN (hypertension): Lasix and lisinopril held on admission. Lopressor continued. BP somewhat labile at low end of normal on discharge. Therefore, lasix dose and lisinopril dose reduced at discharge. Recommend close OP follow up with PCP for optimal BP control   History of Colon cancer: Patient reports he is scheduled for a followup visit in December. No recent treatments.   History of pulmonary embolism/DVT. He is on Lovenox chronically, he will be continued.  Chronic Leukopenia: Remained at baseline during hospitalization   Type 2 diabetes mellitus: Diet controlled. A1c 6.7 8/14.    Unspecified hypothyroidism: Recent TSH was within the limits of normal. Will continue his home Synthroid   Chronic diastolic congestive heart failure: Recent hospitalization for acute on chronic diastolic heart failure. Patient seen by cardiology at that time and medications were adjusted. Patient dehydrated on admission. He was gently hydrated and lasix was held. He compensated at discharge. . Volume status - . Weight 80.4kg up from admission weight of 76.2kg when he was dehydrated. Lasix dose reduced due to dehydration.   Procedures:  none  Consultations:  none  Discharge Exam: Filed Vitals:    12/06/12 0453  BP: 107/50  Pulse: 72  Temp:   Resp:     General: somewhat frail NAD Cardiovascular: S1 and S2. HS somewhat distant. No MGR No LE edema Respiratory: normal effort with ambulation. BS clear to ausculation bilaterally. No wheeze no rhonchi  Discharge Instructions      Discharge Orders   Future Orders Complete By Expires   Diet - low sodium heart healthy  As directed    Discharge instructions  As directed    Comments:     Pt has appointment with PCP 12/18/12.  Lisinopril changed to 1/2 tab due to BP low end of normal.  Lasix dose changed due to dehydration on admission and BP at low end of normal Recommend BMET at follow up for evaluation of potassium level and renal function   Increase activity slowly  As directed        Medication List         alum & mag hydroxide-simeth 200-200-20 MG/5ML suspension  Commonly known as:  MAALOX/MYLANTA  Take 30 mLs by mouth every 6 (six) hours as needed.     enoxaparin 100 MG/ML injection  Commonly known as:  LOVENOX  Inject 100 mg into the skin 2 (two) times daily.     famotidine 20 MG tablet  Commonly known as:  PEPCID  Take 1 tablet (20 mg total) by mouth 2 (two) times daily.     furosemide 20 MG tablet  Commonly known as:  LASIX  Take 20 mg by mouth every morning.     isosorbide mononitrate 30 MG 24  hr tablet  Commonly known as:  IMDUR  Take 1 tablet (30 mg total) by mouth daily. Long acting nitroglycerin medicine for your heart.     levothyroxine 50 MCG tablet  Commonly known as:  SYNTHROID, LEVOTHROID  Take 50 mcg by mouth daily before breakfast.     lisinopril 10 MG tablet  Commonly known as:  PRINIVIL,ZESTRIL  Take 1/2 tab daily.     metoprolol 50 MG tablet  Commonly known as:  LOPRESSOR  Take 12.5 mg by mouth 2 (two) times daily.     omeprazole 40 MG capsule  Commonly known as:  PRILOSEC  Take 1 capsule (40 mg total) by mouth daily. For treatment of acid indigestion/reflux.     PROVENTIL HFA 108  (90 BASE) MCG/ACT inhaler  Generic drug:  albuterol  Inhale 2 puffs into the lungs every 6 (six) hours as needed for wheezing or shortness of breath.     simvastatin 40 MG tablet  Commonly known as:  ZOCOR  Take 20 mg by mouth at bedtime.     terazosin 1 MG capsule  Commonly known as:  HYTRIN  Take 1 mg by mouth at bedtime.       Allergies  Allergen Reactions  . Codeine Shortness Of Breath    Shortness of breath      The results of significant diagnostics from this hospitalization (including imaging, microbiology, ancillary and laboratory) are listed below for reference.    Significant Diagnostic Studies: Ct Abdomen Pelvis Wo Contrast  12/04/2012   CLINICAL DATA:  Left flank pain with nausea and fever. History of hypertension, diabetes and colon cancer.  EXAM: CT ABDOMEN AND PELVIS WITHOUT CONTRAST  TECHNIQUE: Multidetector CT imaging of the abdomen and pelvis was performed following the standard protocol without intravenous contrast.  COMPARISON:  Abdominal pelvic CT 12/20/2008.  FINDINGS: Images through the lung bases demonstrate stable scarring and subpleural nodularity bilaterally. There is slightly increased patchy opacity in the lingula on images 6-9. There is a new small pericardial effusion. No significant pleural fluid is present. A small hiatal hernia is noted.  There is no evidence of urinary tract calculus or hydronephrosis. Renovascular calcifications and left renal cysts are stable. There is stable mild nodularity of both adrenal glands. The liver, pancreas and spleen appear unremarkable. There are possible small gallstones layering in the gallbladder lumen.  There are stable postsurgical changes status post partial colon resection. There is a broad-based ventral hernia containing bowel. There is no evidence of bowel incarceration or obstruction. No inflammatory changes or enlarged lymph nodes are seen. The appendix appears normal. There are diffuse vascular calcifications,  including calcifications within the lumen of both common iliac veins, unchanged. Linear calcifications in the right retroperitoneum are unchanged, likely postsurgical.  The urinary bladder is decompressed by a Foley catheter. There is possible mild bladder wall thickening.  Moderate arthropathic changes of both hips and diffuse lower lumbar spine facet disease are noted.  IMPRESSION: 1. No specific explanation for left flank pain. No evidence of urinary tract calculus or hydronephrosis. 2. New patchy opacity in the lingula and small pericardial effusion. Bibasilar scarring is otherwise unchanged. 3. Stable postsurgical changes related to partial colon resection with linear calcifications in the right retroperitoneum. No evidence of metastatic disease. 4. Stable ventral hernia. No evidence of bowel obstruction or incarceration.   Electronically Signed   By: Roxy Horseman M.D.   On: 12/04/2012 11:48   US Renal  12/04/2012   CLINICAL DATA:  Flank pain, urinary  retention, history colon cancer, coronary artery disease, hypertension, diabetes, history DVT and PE  EXAM: RENAL/URINARY TRACT ULTRASOUND COMPLETE  COMPARISON:  None.  FINDINGS: Right Kidney  Length: 9.6 cm. Normal cortical thickness. Increased cortical echogenicity. Small cyst at mid pole 10 x 13 x 9 mm. No solid mass or hydronephrosis. No definite shadowing calcification.  Left Kidney  Length: 11.0 cm length. Normal cortical thickness. Increased cortical echogenicity. Multiple cysts largest measuring 19 x 20 x 25 mm at upper pole and 21 x 25 x 19 mm at mid kidney. No solid mass identified. Small echogenic focus at mid kidney without definite shadowing, cannot completely exclude a 7 mm calculus. No hydronephrosis.  Bladder  Appears normal for degree of bladder distention.  IMPRESSION: Medical renal disease changes with echogenic renal cortices bilaterally.  Bilateral renal cysts.  Question 7 mm nonobstructing calculus left kidney.   Electronically Signed    By: Ulyses Southward M.D.   On: 12/04/2012 18:58   Dg Chest Port 1 View  12/04/2012   CLINICAL DATA:  Shortness of breath.  EXAM: PORTABLE CHEST - 1 VIEW  COMPARISON:  11/10/2012 and CT chest 10/10/2012.  FINDINGS: Trachea is midline. Heart size normal. Probable pleural parenchymal scarring in the lower hemithoraces bilaterally. Lungs appear mildly hyperinflated but otherwise clear.  IMPRESSION: 1. Favor pleural parenchymal scarring at the bases of both hemithoraces. 2. Hyperinflation without acute findings.   Electronically Signed   By: Leanna Battles M.D.   On: 12/04/2012 09:58   Dg Chest Portable 1 View  11/10/2012   CLINICAL DATA:  Shortness of Breath.  EXAM: PORTABLE CHEST - 1 VIEW  COMPARISON:  CT chest 10/10/2012 and plain films of the chest 09/2012 and 12/24/2011.  FINDINGS: There is chronic scarring in the lung bases. Emphysematous disease is identified. No consolidative process, pneumothorax or effusion is seen. Heart size is normal. No focal bony abnormality.  IMPRESSION: No acute finding. Stable compared to prior exams.   Electronically Signed   By: Drusilla Kanner M.D.   On: 11/10/2012 21:40    Microbiology: No results found for this or any previous visit (from the past 240 hour(s)).   Labs: Basic Metabolic Panel:  Recent Labs Lab 12/04/12 0935 12/05/12 0527 12/06/12 0155  NA 136 140 136  K 3.9 3.7 3.6  CL 97 101 100  CO2 28 30 28   GLUCOSE 127* 100* 104*  BUN 46* 36* 29*  CREATININE 2.25* 1.56* 1.35  CALCIUM 10.1 9.7 9.4   Liver Function Tests:  Recent Labs Lab 12/04/12 0935  AST 13  ALT 5  ALKPHOS 68  BILITOT 0.3  PROT 7.1  ALBUMIN 3.9    Recent Labs Lab 12/04/12 0935  LIPASE 52   No results found for this basename: AMMONIA,  in the last 168 hours CBC:  Recent Labs Lab 12/04/12 0935 12/05/12 0527 12/06/12 0155  WBC 3.3* 3.3* 3.1*  NEUTROABS 1.1*  --   --   HGB 12.6* 11.5* 10.6*  HCT 36.9* 34.7* 31.7*  MCV 94.4 96.4 95.2  PLT 187 157 154    Cardiac Enzymes: No results found for this basename: CKTOTAL, CKMB, CKMBINDEX, TROPONINI,  in the last 168 hours BNP: BNP (last 3 results)  Recent Labs  09/29/12 2348 11/10/12 2136  PROBNP 2067.0* 1765.0*   CBG:  Recent Labs Lab 12/05/12 1106 12/05/12 1655 12/05/12 2158 12/06/12 0737 12/06/12 1127  GLUCAP 108* 97 98 90 78       Signed:  Donicia Druck M  [facility]  12/06/2012, 1:34 PM

## 2012-12-06 NOTE — Progress Notes (Signed)
[facility] PROGRESS NOTE  Bryce Mcdonald WGN:[id number] DOB: Jul 20, 1921 DOA: 12/04/2012 PCP: Provider Not In System  Assessment/Plan: Principal Problem:  Acute renal failure: Likely related to dehydration secondary to anorexia from recent nausea. Creatinine within the limits of normal. Urine output good. Will continue to hold any nephrotoxins.   Active Problems:   Abdominal pain, mostly left lower quadrant and left flank area: Etiology unclear. Continues to be pain free this morning. His lipase, liver transaminases, and urinalysis are all unremarkable. His pain is not associated with his chronic ventral hernia. Pain was focused on left flank area. Tenderness focus to the left flank area as well. CT of the abdomen pelvis yields no hydronephrosis or renal calculi. Renal US yields bilateral renal cyst likely chronic and left non-obstructing kidney calculi. Differential includes renal colic as well as musculoskeletal given fall one month ago. PT evaluation recommending SNF if not at baseline at discharge.   Nausea alone: denies nausea but states "i have no taste for anything". Awaiting abdominal US. Consider nutritional consult for decreased appetite.    Frequent stools: Several bowel movements yesterday. None through the night.  Will check stool for cdiff and GI pathogens. Patient remains afebrile and non-toxic appearing.  HTN (hypertension): Somewhat labile. SBP range 89-134 over last 24 hours. Home medications include Lasix, lisinopril, Lopressor. Continue to hold Lasix and lisinopril secondary to #1. Last 8 hours SBP 104. Monitor   History of Colon cancer: Patient reports he is scheduled for a followup visit in December. No recent treatments.   History of pulmonary embolism/DVT. He is on Lovenox chronically, he will be continued.   Chronic Leukopenia: Chart review indicates current WBC is at his baseline.   Type 2 diabetes mellitus: Diet controlled. CBG range 90-98. A1c 6.7 8/14.  Continue sensative sliding scale insulin for optimal glycemic control.   Unspecified hypothyroidism: Recent TSH was within the limits of normal. Will continue his home Synthroid   Chronic diastolic congestive heart failure: Recent hospitalization for acute on chronic diastolic heart failure. Patient seen by cardiology at that time and medications were adjusted. Patient remains compensated. Will continue his Lopressor with parameters. Volume status - . Weight 80.4kg up from admission weight of  76.2kg when he was dehydrated. Continue to monitor.  Code Status: full Family Communication: left message for daughter Bland Span at [phone]. Disposition Plan: home when ready   Consultants:  none  Procedures:  none  Antibiotics: none HPI/Subjective: Lying in bed states "feeling better today". Reports no appetite "dont have a taste for anything". Denies nausea and/or abdominal pain.  Objective: Filed Vitals:   12/06/12 0453  BP: 107/50  Pulse: 72  Temp:   Resp:     Intake/Output Summary (Last 24 hours) at 12/06/12 0900 Last data filed at 12/06/12 0734  Gross per 24 hour  Intake   1000 ml  Output    915 ml  Net     85 ml   Filed Weights   12/04/12 1428 12/06/12 0453  Weight: 76.204 kg (168 lb) 80.468 kg (177 lb 6.4 oz)    Exam:   General:  Well nourished NAD  Cardiovascular: S1 and S2 with slightly distant heart sounds. There is no LE edema.   Respiratory: normal effort. Breath sounds are clear but slightly distant. i hear no wheeze no crackles  Abdomen: soft +BS non-tender to palpation throughout abdomen.  Musculoskeletal: Joint without swelling or erythema. No clubbing or cyanosis   Data Reviewed: Basic Metabolic Panel:  Recent Labs Lab 12/04/12 0935  12/05/12 0527 12/06/12 0155  NA 136 140 136  K 3.9 3.7 3.6  CL 97 101 100  CO2 28 30 28   GLUCOSE 127* 100* 104*  BUN 46* 36* 29*  CREATININE 2.25* 1.56* 1.35  CALCIUM 10.1 9.7 9.4   Liver Function  Tests:  Recent Labs Lab 12/04/12 0935  AST 13  ALT 5  ALKPHOS 68  BILITOT 0.3  PROT 7.1  ALBUMIN 3.9    Recent Labs Lab 12/04/12 0935  LIPASE 52   No results found for this basename: AMMONIA,  in the last 168 hours CBC:  Recent Labs Lab 12/04/12 0935 12/05/12 0527  WBC 3.3* 3.3*  NEUTROABS 1.1*  --   HGB 12.6* 11.5*  HCT 36.9* 34.7*  MCV 94.4 96.4  PLT 187 157   Cardiac Enzymes: No results found for this basename: CKTOTAL, CKMB, CKMBINDEX, TROPONINI,  in the last 168 hours BNP (last 3 results)  Recent Labs  09/29/12 2348 11/10/12 2136  PROBNP 2067.0* 1765.0*   CBG:  Recent Labs Lab 12/05/12 0734 12/05/12 1106 12/05/12 1655 12/05/12 2158 12/06/12 0737  GLUCAP 84 108* 97 98 90    No results found for this or any previous visit (from the past 240 hour(s)).   Studies: Ct Abdomen Pelvis Wo Contrast  12/04/2012   CLINICAL DATA:  Left flank pain with nausea and fever. History of hypertension, diabetes and colon cancer.  EXAM: CT ABDOMEN AND PELVIS WITHOUT CONTRAST  TECHNIQUE: Multidetector CT imaging of the abdomen and pelvis was performed following the standard protocol without intravenous contrast.  COMPARISON:  Abdominal pelvic CT 12/20/2008.  FINDINGS: Images through the lung bases demonstrate stable scarring and subpleural nodularity bilaterally. There is slightly increased patchy opacity in the lingula on images 6-9. There is a new small pericardial effusion. No significant pleural fluid is present. A small hiatal hernia is noted.  There is no evidence of urinary tract calculus or hydronephrosis. Renovascular calcifications and left renal cysts are stable. There is stable mild nodularity of both adrenal glands. The liver, pancreas and spleen appear unremarkable. There are possible small gallstones layering in the gallbladder lumen.  There are stable postsurgical changes status post partial colon resection. There is a broad-based ventral hernia containing  bowel. There is no evidence of bowel incarceration or obstruction. No inflammatory changes or enlarged lymph nodes are seen. The appendix appears normal. There are diffuse vascular calcifications, including calcifications within the lumen of both common iliac veins, unchanged. Linear calcifications in the right retroperitoneum are unchanged, likely postsurgical.  The urinary bladder is decompressed by a Foley catheter. There is possible mild bladder wall thickening.  Moderate arthropathic changes of both hips and diffuse lower lumbar spine facet disease are noted.  IMPRESSION: 1. No specific explanation for left flank pain. No evidence of urinary tract calculus or hydronephrosis. 2. New patchy opacity in the lingula and small pericardial effusion. Bibasilar scarring is otherwise unchanged. 3. Stable postsurgical changes related to partial colon resection with linear calcifications in the right retroperitoneum. No evidence of metastatic disease. 4. Stable ventral hernia. No evidence of bowel obstruction or incarceration.   Electronically Signed   By: Roxy Horseman M.D.   On: 12/04/2012 11:48   US Renal  12/04/2012   CLINICAL DATA:  Flank pain, urinary retention, history colon cancer, coronary artery disease, hypertension, diabetes, history DVT and PE  EXAM: RENAL/URINARY TRACT ULTRASOUND COMPLETE  COMPARISON:  None.  FINDINGS: Right Kidney  Length: 9.6 cm. Normal cortical thickness. Increased cortical echogenicity. Small  cyst at mid pole 10 x 13 x 9 mm. No solid mass or hydronephrosis. No definite shadowing calcification.  Left Kidney  Length: 11.0 cm length. Normal cortical thickness. Increased cortical echogenicity. Multiple cysts largest measuring 19 x 20 x 25 mm at upper pole and 21 x 25 x 19 mm at mid kidney. No solid mass identified. Small echogenic focus at mid kidney without definite shadowing, cannot completely exclude a 7 mm calculus. No hydronephrosis.  Bladder  Appears normal for degree of bladder  distention.  IMPRESSION: Medical renal disease changes with echogenic renal cortices bilaterally.  Bilateral renal cysts.  Question 7 mm nonobstructing calculus left kidney.   Electronically Signed   By: Ulyses Southward M.D.   On: 12/04/2012 18:58   Dg Chest Port 1 View  12/04/2012   CLINICAL DATA:  Shortness of breath.  EXAM: PORTABLE CHEST - 1 VIEW  COMPARISON:  11/10/2012 and CT chest 10/10/2012.  FINDINGS: Trachea is midline. Heart size normal. Probable pleural parenchymal scarring in the lower hemithoraces bilaterally. Lungs appear mildly hyperinflated but otherwise clear.  IMPRESSION: 1. Favor pleural parenchymal scarring at the bases of both hemithoraces. 2. Hyperinflation without acute findings.   Electronically Signed   By: Leanna Battles M.D.   On: 12/04/2012 09:58    Scheduled Meds: . enoxaparin  120 mg Subcutaneous Q24H  . famotidine  20 mg Oral BID  . influenza vac split quadrivalent PF  0.5 mL Intramuscular Tomorrow-1000  . insulin aspart  0-9 Units Subcutaneous TID WC  . isosorbide mononitrate  30 mg Oral Daily  . levothyroxine  50 mcg Oral QAC breakfast  . pantoprazole (PROTONIX) IV  40 mg Intravenous Q12H  . sodium chloride  3 mL Intravenous Q12H  . terazosin  1 mg Oral QHS   Continuous Infusions: . sodium chloride 20 mL/hr at 12/05/12 1845    Principal Problem:   Abdominal pain, left lower quadrant Active Problems:   HTN (hypertension)   History of pulmonary embolism   Leukopenia   Type 2 diabetes mellitus   Unspecified hypothyroidism   Chronic diastolic congestive heart failure   Acute renal failure   Nausea alone   History of colon cancer   Calculus of left kidney   Bilateral renal cysts    Time spent: 30 minutes    [facility] M  [facility] Pager [phone]. If 7PM-7AM, please contact night-coverage at [email or web address], password [facility] 12/06/2012, 9:00 AM  LOS: 2 days

## 2012-12-06 NOTE — Progress Notes (Signed)
ANTICOAGULATION CONSULT NOTE - Follow Up Consult  Pharmacy Consult for Lovenox (home med) Indication: h/o thrombosis  Allergies  Allergen Reactions  . Codeine Shortness Of Breath    Shortness of breath   Patient Measurements: Height: 5\' 8"  (172.7 cm) Weight: 177 lb 6.4 oz (80.468 kg) IBW/kg (Calculated) : 68.4  Vital Signs: Temp: 98.1 F (36.7 C) (10/17 0449) Temp src: Oral (10/17 0449) BP: 107/50 mmHg (10/17 0453) Pulse Rate: 72 (10/17 0453)  Labs:  Recent Labs  12/04/12 0935 12/05/12 0527 12/06/12 0155  HGB 12.6* 11.5*  --   HCT 36.9* 34.7*  --   PLT 187 157  --   CREATININE 2.25* 1.56* 1.35   Results for Bryce Mcdonald, Bryce Mcdonald (MRN [id number]) as of 12/06/2012 07:52  Ref. Range 12/06/2012 02:00  Heparin LMW No range found 1.41   Estimated Creatinine Clearance: 36.6 ml/min (by C-G formula based on Cr of 1.35).  Medications:  Prescriptions prior to admission  Medication Sig Dispense Refill  . enoxaparin (LOVENOX) 100 MG/ML injection Inject 100 mg into the skin 2 (two) times daily.       . furosemide (LASIX) 20 MG tablet Take 20 mg by mouth every morning.      . furosemide (LASIX) 40 MG tablet Take 40 mg by mouth at bedtime.      . isosorbide mononitrate (IMDUR) 30 MG 24 hr tablet Take 1 tablet (30 mg total) by mouth daily. Long acting nitroglycerin medicine for your heart.  30 tablet  6  . levothyroxine (SYNTHROID, LEVOTHROID) 50 MCG tablet Take 50 mcg by mouth daily before breakfast.      . lisinopril (PRINIVIL,ZESTRIL) 10 MG tablet Take 1 tablet (10 mg total) by mouth daily.  30 tablet  0  . metoprolol (LOPRESSOR) 50 MG tablet Take 12.5 mg by mouth 2 (two) times daily.      Marland Kitchen omeprazole (PRILOSEC) 40 MG capsule Take 1 capsule (40 mg total) by mouth daily. For treatment of acid indigestion/reflux.  30 capsule  6  . simvastatin (ZOCOR) 40 MG tablet Take 20 mg by mouth at bedtime.       Marland Kitchen terazosin (HYTRIN) 1 MG capsule Take 1 mg by mouth at bedtime.      Marland Kitchen albuterol  (PROVENTIL HFA) 108 (90 BASE) MCG/ACT inhaler Inhale 2 puffs into the lungs every 6 (six) hours as needed for wheezing or shortness of breath.       Assessment: 79yo male who was on Lovenox 100mg  SQ q12hrs for h/o thromboembolic events.  Pt's daughter states pt was at [facility] recently and they instructed him to increase his Lovenox to this dose.  Pt's weight = 80Kg.  Pt's SCr was elevated on admission but is improving.  Lovenox level checked last night is above goal range.   Estimated Creatinine Clearance: 36.6 ml/min (by C-G formula based on Cr of 1.35).  Goal of Therapy:  Anti-Xa level 0.6-1.2 units/ml 4hrs after LMWH dose given Monitor platelets by anticoagulation protocol: Yes   Plan:  Reduce Lovenox to 120mg  SQ q24hrs (1.5 mg/kg/day) Monitor CBC and s/sx of bleeding complications Recheck LMWH level at steady state if renal fxn worsens  Bryce Mcdonald, Bryce Mcdonald 12/06/2012,7:50 AM

## 2012-12-06 NOTE — Progress Notes (Signed)
Physical Therapy Treatment Patient Details Name: Bryce Mcdonald MRN: [id number] DOB: August 10, 1921 Today's Date: 12/06/2012 Time: 7829-5621 PT Time Calculation (min): 25 min  PT Assessment / Plan / Recommendation  History of Present Illness Pt reports feeling much better today...no significant pain, no nausea.  PT Comments   Pt strength and mobility is significantly improved today.  He is transferring independently and able to ambulate 200' with a walker and good stability.  Acute care PT goals have been met.  Follow Up Recommendations  Home health PT     Does the patient have the potential to tolerate intense rehabilitation     Barriers to Discharge        Equipment Recommendations  None recommended by PT    Recommendations for Other Services    Frequency     Progress towards PT Goals Progress towards PT goals: Goals met/education completed, patient discharged from PT  Plan Discharge plan needs to be updated    Precautions / Restrictions     Pertinent Vitals/Pain     Mobility  Bed Mobility Supine to Sit: 6: Modified independent (Device/Increase time);HOB flat Transfers Sit to Stand: 6: Modified independent (Device/Increase time);From bed Stand to Sit: 6: Modified independent (Device/Increase time);To chair/3-in-1 Ambulation/Gait Ambulation/Gait Assistance: 6: Modified independent (Device/Increase time) Ambulation Distance (Feet): 200 Feet Assistive device: Rolling walker Gait Pattern: Within Functional Limits;Trunk flexed Gait velocity: WNL General Gait Details: gait much more normal today  Stairs: No Wheelchair Mobility Wheelchair Mobility: No    Exercises General Exercises - Lower Extremity Ankle Circles/Pumps: AROM;Both;10 reps;Supine Quad Sets: AROM;Both;10 reps;Supine Gluteal Sets: AROM;Both;10 reps;Supine Short Arc Quad: AROM;Both;10 reps;Supine Heel Slides: AROM;Both;10 reps;Supine Hip ABduction/ADduction: AROM;Both;10 reps;Supine   PT Diagnosis:    PT  Problem List:   PT Treatment Interventions:     PT Goals (current goals can now be found in the care plan section)    Visit Information  Last PT Received On: 12/06/12    Subjective Data      Cognition  Cognition Arousal/Alertness: Awake/alert Behavior During Therapy: [facility] for tasks assessed/performed Overall Cognitive Status: Within Functional Limits for tasks assessed    Balance  Balance Balance Assessed: No  End of Session PT - End of Session Equipment Utilized During Treatment: Gait belt Activity Tolerance: Patient tolerated treatment well Patient left: in chair;with call bell/phone within reach;with family/visitor present   GP Mobility: Walking and Moving Around Discharge Status [phone]): 0 percent impaired, limited or restricted   Myrlene Broker L 12/06/2012, 10:23 AM

## 2012-12-06 NOTE — Discharge Summary (Signed)
The patient was seen and examined. He was discussed with nurse practitioner, Ms. Vedia Coffer. Agree with her assessment and plan. His renal function has virtually normalized. The plan was discussed with his daughters. As discussed, Lasix was decreased to 20 mg daily and the dose of lisinopril was decreased to 5 mg daily. These medications may need to be increased upon further followup with his primary care physician at the [facility]. However, at this time, given his marginal intake, would favor continuing the changes made.

## 2012-12-06 NOTE — Progress Notes (Signed)
The patient was seen and examined. He was discussed with nurse practitioner, Ms. Vedia Coffer. Agree with assessment and plan. See discharge summary.

## 2012-12-15 ENCOUNTER — Emergency Department (HOSPITAL_COMMUNITY): Payer: Non-veteran care

## 2012-12-15 ENCOUNTER — Emergency Department (HOSPITAL_COMMUNITY)
Admission: EM | Admit: 2012-12-15 | Discharge: 2012-12-15 | Disposition: A | Discharge status: Home / Self Care | Payer: Non-veteran care | Attending: Emergency Medicine | Admitting: Emergency Medicine

## 2012-12-15 ENCOUNTER — Encounter (HOSPITAL_COMMUNITY): Payer: Self-pay | Admitting: Emergency Medicine

## 2012-12-15 DIAGNOSIS — E7800 Pure hypercholesterolemia, unspecified: Secondary | ICD-10-CM | POA: Insufficient documentation

## 2012-12-15 DIAGNOSIS — R042 Hemoptysis: Secondary | ICD-10-CM | POA: Insufficient documentation

## 2012-12-15 DIAGNOSIS — K089 Disorder of teeth and supporting structures, unspecified: Secondary | ICD-10-CM | POA: Insufficient documentation

## 2012-12-15 DIAGNOSIS — Z86718 Personal history of other venous thrombosis and embolism: Secondary | ICD-10-CM | POA: Insufficient documentation

## 2012-12-15 DIAGNOSIS — Z79899 Other long term (current) drug therapy: Secondary | ICD-10-CM | POA: Insufficient documentation

## 2012-12-15 DIAGNOSIS — E039 Hypothyroidism, unspecified: Secondary | ICD-10-CM | POA: Insufficient documentation

## 2012-12-15 DIAGNOSIS — Z85038 Personal history of other malignant neoplasm of large intestine: Secondary | ICD-10-CM | POA: Insufficient documentation

## 2012-12-15 DIAGNOSIS — Z86711 Personal history of pulmonary embolism: Secondary | ICD-10-CM | POA: Insufficient documentation

## 2012-12-15 DIAGNOSIS — Z87891 Personal history of nicotine dependence: Secondary | ICD-10-CM | POA: Insufficient documentation

## 2012-12-15 DIAGNOSIS — E119 Type 2 diabetes mellitus without complications: Secondary | ICD-10-CM | POA: Insufficient documentation

## 2012-12-15 DIAGNOSIS — R011 Cardiac murmur, unspecified: Secondary | ICD-10-CM | POA: Insufficient documentation

## 2012-12-15 DIAGNOSIS — I10 Essential (primary) hypertension: Secondary | ICD-10-CM | POA: Insufficient documentation

## 2012-12-15 DIAGNOSIS — Z8546 Personal history of malignant neoplasm of prostate: Secondary | ICD-10-CM | POA: Insufficient documentation

## 2012-12-15 DIAGNOSIS — Z87442 Personal history of urinary calculi: Secondary | ICD-10-CM | POA: Insufficient documentation

## 2012-12-15 DIAGNOSIS — R0602 Shortness of breath: Secondary | ICD-10-CM | POA: Insufficient documentation

## 2012-12-15 DIAGNOSIS — I4891 Unspecified atrial fibrillation: Secondary | ICD-10-CM | POA: Insufficient documentation

## 2012-12-15 DIAGNOSIS — I509 Heart failure, unspecified: Secondary | ICD-10-CM | POA: Insufficient documentation

## 2012-12-15 DIAGNOSIS — I2510 Atherosclerotic heart disease of native coronary artery without angina pectoris: Secondary | ICD-10-CM | POA: Insufficient documentation

## 2012-12-15 LAB — CBC WITH DIFFERENTIAL/PLATELET
Basophils Absolute: 0 10*3/uL (ref 0.0–0.1)
Basophils Relative: 0 % (ref 0–1)
Eosinophils Absolute: 0 10*3/uL (ref 0.0–0.7)
HCT: 33 % — ABNORMAL LOW (ref 39.0–52.0)
Hemoglobin: 11.1 g/dL — ABNORMAL LOW (ref 13.0–17.0)
MCH: 32.2 pg (ref 26.0–34.0)
MCHC: 33.6 g/dL (ref 30.0–36.0)
Monocytes Absolute: 0.3 10*3/uL (ref 0.1–1.0)
Neutro Abs: 1.1 10*3/uL — ABNORMAL LOW (ref 1.7–7.7)
Neutrophils Relative %: 31 % — ABNORMAL LOW (ref 43–77)
Platelets: 160 10*3/uL (ref 150–400)
RDW: 13.4 % (ref 11.5–15.5)

## 2012-12-15 LAB — BASIC METABOLIC PANEL
Chloride: 100 mEq/L (ref 96–112)
GFR calc Af Amer: 64 mL/min — ABNORMAL LOW (ref >90)
GFR calc non Af Amer: 55 mL/min — ABNORMAL LOW (ref >90)
Potassium: 3.4 mEq/L — ABNORMAL LOW (ref 3.5–5.1)
Sodium: 140 mEq/L (ref 135–145)

## 2012-12-15 LAB — APTT: aPTT: 48 seconds — ABNORMAL HIGH (ref 24–37)

## 2012-12-15 LAB — TROPONIN I: Troponin I: <0.3 ng/mL (ref <0.30)

## 2012-12-15 MED ORDER — ALBUTEROL SULFATE HFA 108 (90 BASE) MCG/ACT IN AERS
INHALATION_SPRAY | RESPIRATORY_TRACT | Status: AC
  Filled 2012-12-15: qty 6.7

## 2012-12-15 MED ORDER — IOHEXOL 350 MG/ML SOLN
100.0000 mL | Freq: Once | INTRAVENOUS | Status: AC | PRN
  Administered 2012-12-15: 100 mL via INTRAVENOUS

## 2012-12-15 MED ORDER — BENZONATATE 100 MG PO CAPS
100.0000 mg | ORAL_CAPSULE | Freq: Three times a day (TID) | ORAL | Status: DC
Start: 2012-12-15 — End: 2013-04-23

## 2012-12-15 MED ORDER — ALBUTEROL SULFATE HFA 108 (90 BASE) MCG/ACT IN AERS
2.0000 | INHALATION_SPRAY | Freq: Once | RESPIRATORY_TRACT | Status: AC
  Administered 2012-12-15: 2 via RESPIRATORY_TRACT

## 2012-12-15 NOTE — ED Provider Notes (Signed)
CSN: [id number]     Arrival date & time 12/15/12  1750 History   First MD Initiated Contact with Patient 12/15/12 1756     Chief Complaint  Patient presents with  . Cough  . Shortness of Breath   (Consider location/radiation/quality/duration/timing/severity/associated sxs/prior Treatment) HPI Comments: 79 year old male with a history of congestive heart failure, pulmonary embolism, prostate and colon cancer. He presents after having 2 recent admissions to the hospital for congestive heart failure and for acute renal failure in the setting of dehydration with a complaint of hemoptysis. He states that he is on 2 L of oxygen at home as needed, today he had been coughing and coughed up a small amount of blood in his sputum. This happened several weeks ago but resolved spontaneously. He has been feeling more short of breath over the last 2 days, this is persistent, worsening, no associated fevers chills or lower extremities swelling. He states that 2 nights ago he felt acute onset of "something hit me in my chest" while he was sleeping. The coughing and shortness of breath seemed to get worse since that time.  Patient is a 79 y.o. male presenting with cough and shortness of breath. The history is provided by the patient and medical records.  Cough Associated symptoms: shortness of breath   Shortness of Breath Associated symptoms: cough     Past Medical History  Diagnosis Date  . Colon cancer     Status post partial colectomy  . Hypertension   . Hypercholesterolemia   . Blood transfusion   . Coronary artery disease 2011    Non obstructive CAD  . DVT (deep venous thrombosis)   . PE (pulmonary embolism)   . Diabetes mellitus without complication   . A-fib   . Diabetes mellitus, type II   . Ventral hernia   . Right bundle branch block   . Unspecified hypothyroidism   . LVH (left ventricular hypertrophy) 10/10/2012    Ejection fraction 60-65%.  . Bilateral renal cysts   . Calculus of left  kidney     non-obstructing   Past Surgical History  Procedure Laterality Date  . Abdominal surgery      x 3  . Cardiac catheterization  2008   History reviewed. No pertinent family history. History  Substance Use Topics  . Smoking status: Former Games developer  . Smokeless tobacco: Not on file     Comment: quit over 40 years ago  . Alcohol Use: No     Comment: quit over forty years ago    Review of Systems  Respiratory: Positive for cough and shortness of breath.   All other systems reviewed and are negative.    Allergies  Codeine  Home Medications   Current Outpatient Rx  Name  Route  Sig  Dispense  Refill  . albuterol (PROVENTIL HFA) 108 (90 BASE) MCG/ACT inhaler   Inhalation   Inhale 2 puffs into the lungs every 6 (six) hours as needed for wheezing or shortness of breath.         Marland Kitchen alum & mag hydroxide-simeth (MAALOX/MYLANTA) 200-200-20 MG/5ML suspension   Oral   Take 30 mLs by mouth every 6 (six) hours as needed for indigestion.         . enoxaparin (LOVENOX) 100 MG/ML injection   Subcutaneous   Inject 100 mg into the skin 2 (two) times daily.          . famotidine (PEPCID) 20 MG tablet   Oral   Take 1  tablet (20 mg total) by mouth 2 (two) times daily.         . furosemide (LASIX) 20 MG tablet   Oral   Take 20 mg by mouth every morning.         . isosorbide mononitrate (IMDUR) 30 MG 24 hr tablet   Oral   Take 1 tablet (30 mg total) by mouth daily. Long acting nitroglycerin medicine for your heart.   30 tablet   6   . levothyroxine (SYNTHROID, LEVOTHROID) 50 MCG tablet   Oral   Take 50 mcg by mouth daily before breakfast.         . lisinopril (PRINIVIL,ZESTRIL) 10 MG tablet   Oral   Take 5 mg by mouth daily.         . metoprolol (LOPRESSOR) 50 MG tablet   Oral   Take 12.5 mg by mouth 2 (two) times daily.         Marland Kitchen omeprazole (PRILOSEC) 40 MG capsule   Oral   Take 1 capsule (40 mg total) by mouth daily. For treatment of acid  indigestion/reflux.   30 capsule   6   . simvastatin (ZOCOR) 40 MG tablet   Oral   Take 20 mg by mouth at bedtime.          Marland Kitchen terazosin (HYTRIN) 1 MG capsule   Oral   Take 1 mg by mouth at bedtime.         . benzonatate (TESSALON) 100 MG capsule   Oral   Take 1 capsule (100 mg total) by mouth every 8 (eight) hours.   21 capsule   0    BP 113/51  Pulse 39  Temp(Src) 97.7 F (36.5 C) (Oral)  Resp 20  SpO2 100% Physical Exam  Nursing note and vitals reviewed. Constitutional: He appears well-developed and well-nourished. No distress.  HENT:  Head: Normocephalic and atraumatic.  Mouth/Throat: Oropharynx is clear and moist. No oropharyngeal exudate.  Poor dentition  Eyes: Conjunctivae and EOM are normal. Pupils are equal, round, and reactive to light. Right eye exhibits no discharge. Left eye exhibits no discharge. No scleral icterus.  Neck: Normal range of motion. Neck supple. No JVD present. No thyromegaly present.  Cardiovascular: Normal rate, regular rhythm and intact distal pulses.  Exam reveals no gallop and no friction rub.   Murmur ( soft systolic) heard. Pulmonary/Chest: Effort normal. No respiratory distress. He has no wheezes. He has rales ( intermittent rales at the bases).  Abdominal: Soft. Bowel sounds are normal. He exhibits no distension and no mass. There is no tenderness.  Musculoskeletal: Normal range of motion. He exhibits edema ( scant bilateral LE edema). He exhibits no tenderness.  Lymphadenopathy:    He has no cervical adenopathy.  Neurological: He is alert. Coordination normal.  Skin: Skin is warm and dry. No rash noted. No erythema.  Psychiatric: He has a normal mood and affect. His behavior is normal.    ED Course  Procedures (including critical care time) Labs Review Labs Reviewed  CBC WITH DIFFERENTIAL - Abnormal; Notable for the following:    WBC 3.4 (*)    RBC 3.45 (*)    Hemoglobin 11.1 (*)    HCT 33.0 (*)    Neutrophils Relative %  31 (*)    Neutro Abs 1.1 (*)    Lymphocytes Relative 60 (*)    All other components within normal limits  BASIC METABOLIC PANEL - Abnormal; Notable for the following:    Potassium 3.4 (*)  Glucose, Bld 130 (*)    GFR calc non Af Amer 55 (*)    GFR calc Af Amer 64 (*)    All other components within normal limits  APTT - Abnormal; Notable for the following:    aPTT 48 (*)    All other components within normal limits  TROPONIN I  PROTIME-INR   Imaging Review Dg Chest 2 View  12/15/2012   CLINICAL DATA:  Shortness of breath.  EXAM: CHEST  2 VIEW  COMPARISON:  December 04, 2012.  FINDINGS: Cardiomediastinal silhouette appears normal. Hyperinflation of the lungs is noted. Stable bilateral basilar scarring is noted. No acute pulmonary disease is noted. No pneumothorax is noted.  IMPRESSION: No acute cardiopulmonary abnormality seen.   Electronically Signed   By: Roque Lias M.D.   On: 12/15/2012 19:02   Ct Angio Chest Pe W/cm &/or Wo Cm  12/15/2012   CLINICAL DATA:  Colon carcinoma.  Cough, hemoptysis.  EXAM: CT ANGIOGRAPHY CHEST WITH CONTRAST  TECHNIQUE: Multidetector CT imaging of the chest was performed using the standard protocol during bolus administration of intravenous contrast. Multiplanar CT image reconstructions including MIPs were obtained to evaluate the vascular anatomy.  CONTRAST:  OMNIPAQUE IOHEXOL 350 MG/ML SOLN  COMPARISON:  10/10/2012 and earlier studies  FINDINGS: Satisfactory opacification of pulmonary arteries noted, and there is no evidence of pulmonary emboli. Early contrast opacification of the thoracic aorta. Coronary and aortic calcifications. Classic 3 vessel brachiocephalic arterial origin anatomy. No pleural or pericardial effusion. Single 11 mm enlarged AP window node. No hilar adenopathy. Linear scarring in both lower lobes. Subpleural 5 mm right middle lobe nodule and 3 mm lateral basal segment left lower lobe subpleural nodule, without enlargement since scans  dating back to 02/23/2006. Marland Kitchen No new pulmonary nodule or infiltrate. Subpleural bleb in the anterior right upper lobe as before. Mild spondylitic changes in the mid thoracic spine. Sternum intact. Visualized portions of upper abdomen unremarkable.  Review of the MIP images confirms the above findings.  IMPRESSION: 1. Negative for acute PE. 2. Single enlarged AP window lymph node, nonspecific. 3. Atherosclerosis, including aortic and coronary artery disease. Please note that although the presence of coronary artery calcium documents the presence of coronary artery disease, the severity of this disease and any potential stenosis cannot be assessed on this non-gated CT examination. Assessment for potential risk factor modification, dietary therapy or pharmacologic therapy may be warranted, if clinically indicated. 4. Stable subcentimeter pulmonary nodules since 2008, presumably benign.   Electronically Signed   By: Oley Balm M.D.   On: 12/15/2012 20:47    EKG Interpretation     Ventricular Rate:  67 PR Interval:    QRS Duration: 130 QT Interval:  456 QTC Calculation: 481 R Axis:   -93 Text Interpretation:  Atrial fibrillation Right bundle branch block Inferior infarct , age undetermined Possible Anterior infarct , age undetermined Abnormal ECG When compared with ECG of 04-Dec-2012 09:20, No significant change was found            MDM   1. Hemoptysis    Currently the patient has a very mild hypoxia, no tachycardia, some abnormal lung sounds and no significant peripheral edema. He is on Lovenox shots for his history of DVT and PE, he will need further evaluation for other sources such as pneumonia, anemia, coagulopathy, possible recurrent pulmonary embolism given the events of 2 nights ago.  Recent renal failuire, labs pending.  Findings discussed with patient, I encouraged him to take  his CAT scan results to his family doctor. I suspect that the etiology of his hemoptysis is benign. He  is on Lovenox but has had no further hemoptysis today, this was an isolated episode prior to arrival. There is no signs of pulmonary embolism on the CT scan nor is there signs of significant changes in the subcentimeter nodules, there is no active lung cancer that can be seen and no pneumonia.  Patient informed of these results   Meds given in ED:  Medications  albuterol (PROVENTIL HFA;VENTOLIN HFA) 108 (90 BASE) MCG/ACT inhaler 2 puff (not administered)  iohexol (OMNIPAQUE) 350 MG/ML injection 100 mL (100 mLs Intravenous Contrast Given 12/15/12 1950)    New Prescriptions   BENZONATATE (TESSALON) 100 MG CAPSULE    Take 1 capsule (100 mg total) by mouth every 8 (eight) hours.      Vida Roller, MD 12/15/12 2108

## 2012-12-15 NOTE — ED Notes (Signed)
Pt c/o increased sob today. On 2l [place] 02 at home PRN and came in with it today. Coughing up blood x 2-3 weeks per pt. Alert/oriented. nad at this time. Bilateral ankle swelling noted. Urinary frequency. Diarrhea this am

## 2012-12-29 ENCOUNTER — Emergency Department (HOSPITAL_COMMUNITY)
Admission: EM | Admit: 2012-12-29 | Discharge: 2012-12-29 | Disposition: A | Discharge status: Home / Self Care | Payer: Non-veteran care | Attending: Emergency Medicine | Admitting: Emergency Medicine

## 2012-12-29 ENCOUNTER — Encounter (HOSPITAL_COMMUNITY): Payer: Self-pay | Admitting: Emergency Medicine

## 2012-12-29 ENCOUNTER — Emergency Department (HOSPITAL_COMMUNITY): Payer: Non-veteran care

## 2012-12-29 DIAGNOSIS — Z79899 Other long term (current) drug therapy: Secondary | ICD-10-CM | POA: Insufficient documentation

## 2012-12-29 DIAGNOSIS — R109 Unspecified abdominal pain: Secondary | ICD-10-CM | POA: Insufficient documentation

## 2012-12-29 DIAGNOSIS — J3489 Other specified disorders of nose and nasal sinuses: Secondary | ICD-10-CM | POA: Insufficient documentation

## 2012-12-29 DIAGNOSIS — Z87891 Personal history of nicotine dependence: Secondary | ICD-10-CM | POA: Insufficient documentation

## 2012-12-29 DIAGNOSIS — E7800 Pure hypercholesterolemia, unspecified: Secondary | ICD-10-CM | POA: Insufficient documentation

## 2012-12-29 DIAGNOSIS — Z8719 Personal history of other diseases of the digestive system: Secondary | ICD-10-CM | POA: Insufficient documentation

## 2012-12-29 DIAGNOSIS — E039 Hypothyroidism, unspecified: Secondary | ICD-10-CM | POA: Insufficient documentation

## 2012-12-29 DIAGNOSIS — Z792 Long term (current) use of antibiotics: Secondary | ICD-10-CM | POA: Insufficient documentation

## 2012-12-29 DIAGNOSIS — Z87442 Personal history of urinary calculi: Secondary | ICD-10-CM | POA: Insufficient documentation

## 2012-12-29 DIAGNOSIS — R042 Hemoptysis: Secondary | ICD-10-CM | POA: Insufficient documentation

## 2012-12-29 DIAGNOSIS — Z86718 Personal history of other venous thrombosis and embolism: Secondary | ICD-10-CM | POA: Insufficient documentation

## 2012-12-29 DIAGNOSIS — I10 Essential (primary) hypertension: Secondary | ICD-10-CM | POA: Insufficient documentation

## 2012-12-29 DIAGNOSIS — Z86711 Personal history of pulmonary embolism: Secondary | ICD-10-CM | POA: Insufficient documentation

## 2012-12-29 DIAGNOSIS — Z85038 Personal history of other malignant neoplasm of large intestine: Secondary | ICD-10-CM | POA: Insufficient documentation

## 2012-12-29 DIAGNOSIS — I2510 Atherosclerotic heart disease of native coronary artery without angina pectoris: Secondary | ICD-10-CM | POA: Insufficient documentation

## 2012-12-29 DIAGNOSIS — Z95818 Presence of other cardiac implants and grafts: Secondary | ICD-10-CM | POA: Insufficient documentation

## 2012-12-29 DIAGNOSIS — E119 Type 2 diabetes mellitus without complications: Secondary | ICD-10-CM | POA: Insufficient documentation

## 2012-12-29 DIAGNOSIS — Z8546 Personal history of malignant neoplasm of prostate: Secondary | ICD-10-CM | POA: Insufficient documentation

## 2012-12-29 DIAGNOSIS — R230 Cyanosis: Secondary | ICD-10-CM | POA: Insufficient documentation

## 2012-12-29 DIAGNOSIS — Z87448 Personal history of other diseases of urinary system: Secondary | ICD-10-CM | POA: Insufficient documentation

## 2012-12-29 DIAGNOSIS — I509 Heart failure, unspecified: Secondary | ICD-10-CM | POA: Insufficient documentation

## 2012-12-29 LAB — BASIC METABOLIC PANEL
BUN: 20 mg/dL (ref 6–23)
Calcium: 10.1 mg/dL (ref 8.4–10.5)
Creatinine, Ser: 1.2 mg/dL (ref 0.50–1.35)
GFR calc Af Amer: 60 mL/min — ABNORMAL LOW (ref >90)
GFR calc non Af Amer: 52 mL/min — ABNORMAL LOW (ref >90)
Glucose, Bld: 100 mg/dL — ABNORMAL HIGH (ref 70–99)

## 2012-12-29 LAB — BLOOD GAS, ARTERIAL
Acid-base deficit: 1.3 mmol/L (ref 0.0–2.0)
Bicarbonate: 22.8 mEq/L (ref 20.0–24.0)
Patient temperature: 37
TCO2: 21.1 mmol/L (ref 0–100)
pH, Arterial: 7.407 (ref 7.350–7.450)
pO2, Arterial: 79.3 mmHg — ABNORMAL LOW (ref 80.0–100.0)

## 2012-12-29 LAB — URINALYSIS, ROUTINE W REFLEX MICROSCOPIC
Bilirubin Urine: NEGATIVE
Glucose, UA: NEGATIVE mg/dL
Hgb urine dipstick: NEGATIVE
Ketones, ur: NEGATIVE mg/dL
Leukocytes, UA: NEGATIVE
Nitrite: NEGATIVE
Specific Gravity, Urine: 1.02 (ref 1.005–1.030)
pH: 6 (ref 5.0–8.0)

## 2012-12-29 LAB — CBC WITH DIFFERENTIAL/PLATELET
Basophils Relative: 1 % (ref 0–1)
Eosinophils Absolute: 0 10*3/uL (ref 0.0–0.7)
Eosinophils Relative: 1 % (ref 0–5)
HCT: 34.1 % — ABNORMAL LOW (ref 39.0–52.0)
Hemoglobin: 11.2 g/dL — ABNORMAL LOW (ref 13.0–17.0)
Lymphs Abs: 1.8 10*3/uL (ref 0.7–4.0)
MCH: 32 pg (ref 26.0–34.0)
MCHC: 32.8 g/dL (ref 30.0–36.0)
MCV: 97.4 fL (ref 78.0–100.0)
Monocytes Absolute: 0.3 10*3/uL (ref 0.1–1.0)
Monocytes Relative: 9 % (ref 3–12)
RBC: 3.5 MIL/uL — ABNORMAL LOW (ref 4.22–5.81)

## 2012-12-29 LAB — TYPE AND SCREEN: Antibody Screen: NEGATIVE

## 2012-12-29 MED ORDER — MOXIFLOXACIN HCL 400 MG PO TABS: 400.0000 mg | ORAL_TABLET | Freq: Every day | ORAL | Status: DC

## 2012-12-29 NOTE — ED Notes (Signed)
Pt c/o coughing up brown blood that started "awhile ago" states that it has gotten worse over the past few days, c/o abd pain from area where he states he has a hernia. Dr. Effie Shy at bedside,

## 2012-12-29 NOTE — ED Notes (Signed)
Per triage RN, EDP ordered abg not abd as documented previously.

## 2012-12-29 NOTE — ED Provider Notes (Signed)
CSN: [id number]     Arrival date & time 12/29/12  4098 History  This chart was scribed for Bryce Melter, MD by Quintella Reichert, ED scribe.  This patient was seen in room APA04/APA04 and the patient's care was started at 10:08 AM.   Chief Complaint  Patient presents with  . Hemoptysis    The history is provided by the patient. No language interpreter was used.    HPI Comments: Bryce Mcdonald is a 79 y.o. male with a history of congestive heart failure, pulmonary embolism, prostate and colon cancer who presents to the Emergency Department complaining of hemoptysis that has been ongoing for several weeks.  Pt states that on waking in the morning he coughs productive of "dark red and black" blood.  He denies production of sputum.  He also complains of abdominal pain and some pressure in his left paranasal sinuses.  He denies CP, weakness, or dizziness.  He states his breathing is "really good."  He ate breakfast this morning.    Pt states that he recently told a physician at the [facility] about his hemoptysis and was told "we would look into it."  He has another appointment there soon.  Pt is on Lovenox injections 2x/day.  He was recently taken off of home oxygen because "they said I didn't need it."     Past Medical History  Diagnosis Date  . Colon cancer     Status post partial colectomy  . Hypertension   . Hypercholesterolemia   . Blood transfusion   . Coronary artery disease 2011    Non obstructive CAD  . DVT (deep venous thrombosis)   . PE (pulmonary embolism)   . Diabetes mellitus without complication   . A-fib   . Diabetes mellitus, type II   . Ventral hernia   . Right bundle branch block   . Unspecified hypothyroidism   . LVH (left ventricular hypertrophy) 10/10/2012    Ejection fraction 60-65%.  . Bilateral renal cysts   . Calculus of left kidney     non-obstructing    Past Surgical History  Procedure Laterality Date  . Abdominal surgery      x 3  . Cardiac  catheterization  2008    No family history on file.   History  Substance Use Topics  . Smoking status: Former Games developer  . Smokeless tobacco: Not on file     Comment: quit over 40 years ago  . Alcohol Use: No     Comment: quit over forty years ago     Review of Systems  HENT: Positive for sinus pressure.   Respiratory: Positive for cough (hemoptysis). Negative for shortness of breath.   Cardiovascular: Negative for chest pain.  Gastrointestinal: Positive for abdominal pain.  Neurological: Negative for dizziness and weakness.  All other systems reviewed and are negative.     Allergies  Codeine  Home Medications   Current Outpatient Rx  Name  Route  Sig  Dispense  Refill  . albuterol (PROVENTIL HFA) 108 (90 BASE) MCG/ACT inhaler   Inhalation   Inhale 2 puffs into the lungs every 6 (six) hours as needed for wheezing or shortness of breath.         . benzonatate (TESSALON) 100 MG capsule   Oral   Take 1 capsule (100 mg total) by mouth every 8 (eight) hours.   21 capsule   0   . enoxaparin (LOVENOX) 100 MG/ML injection   Subcutaneous   Inject 100 mg  into the skin 2 (two) times daily.          . famotidine (PEPCID) 20 MG tablet   Oral   Take 1 tablet (20 mg total) by mouth 2 (two) times daily.         . furosemide (LASIX) 20 MG tablet   Oral   Take 20 mg by mouth every morning.         . isosorbide mononitrate (IMDUR) 30 MG 24 hr tablet   Oral   Take 1 tablet (30 mg total) by mouth daily. Long acting nitroglycerin medicine for your heart.   30 tablet   6   . levothyroxine (SYNTHROID, LEVOTHROID) 50 MCG tablet   Oral   Take 50 mcg by mouth daily before breakfast.         . lisinopril (PRINIVIL,ZESTRIL) 10 MG tablet   Oral   Take 5 mg by mouth daily.         Marland Kitchen OVER THE COUNTER MEDICATION   Nasal   Place 1 spray into the nose daily as needed (over the counter nasal spray).         . simvastatin (ZOCOR) 40 MG tablet   Oral   Take 20 mg by  mouth at bedtime.          Marland Kitchen terazosin (HYTRIN) 1 MG capsule   Oral   Take 1 mg by mouth at bedtime.         . moxifloxacin (AVELOX) 400 MG tablet   Oral   Take 1 tablet (400 mg total) by mouth daily at 8 pm.   7 tablet   0    BP 148/75  Temp(Src) 97.8 F (36.6 C) (Oral)  Resp 20  SpO2 88%  Physical Exam  Nursing note and vitals reviewed. Constitutional: He is oriented to person, place, and time. He appears well-developed and well-nourished.  HENT:  Head: Normocephalic and atraumatic.  Right Ear: External ear normal.  Left Ear: External ear normal.  Eyes: EOM are normal. Pupils are equal, round, and reactive to light.  Conjunctiva pale  Neck: Normal range of motion and phonation normal. Neck supple.  Cardiovascular: Normal rate, regular rhythm, normal heart sounds and intact distal pulses.   Pulmonary/Chest: Effort normal. He has decreased breath sounds. He has no wheezes. He has rhonchi. He has no rales. He exhibits no bony tenderness.  Diminished air movement bilaterally Scattered rhonchi  Abdominal: Soft. Normal appearance. There is no tenderness.  Musculoskeletal: Normal range of motion.  Neurological: He is alert and oriented to person, place, and time. No cranial nerve deficit or sensory deficit. He exhibits normal muscle tone. Coordination normal.  Skin: Skin is warm, dry and intact. There is cyanosis (nail beds).  Psychiatric: He has a normal mood and affect. His behavior is normal. Judgment and thought content normal.    ED Course  Procedures (including critical care time)  Patient Vitals for the past 24 hrs:  BP Temp Temp src Pulse Resp SpO2  12/29/12 1300 128/56 mmHg - - - 21 94 %  12/29/12 1216 118/84 mmHg - - 63 20 100 %  12/29/12 1127 - - - - 20 97 %  12/29/12 1125 - - - 76 21 99 %  12/29/12 1052 - - - - 23 96 %  12/29/12 1036 - - - - 19 98 %  12/29/12 1032 - - - 70 20 100 %  12/29/12 1019 148/75 mmHg 97.8 F (36.6 C) Oral - 20 88 %  12/29/12  1009 148/75 mmHg - - - - -  (O2 saturation on arrival was 70% on room air)  DIAGNOSTIC STUDIES: Oxygen Saturation is 88% on room air, low by my interpretation.    COORDINATION OF CARE: 10:14 AM-Discussed treatment plan which includes CXR and labs with pt at bedside and pt agreed to plan.    Labs Review Labs Reviewed  CBC WITH DIFFERENTIAL - Abnormal; Notable for the following:    WBC 3.2 (*)    RBC 3.50 (*)    Hemoglobin 11.2 (*)    HCT 34.1 (*)    Neutrophils Relative % 33 (*)    Neutro Abs 1.0 (*)    Lymphocytes Relative 57 (*)    All other components within normal limits  BASIC METABOLIC PANEL - Abnormal; Notable for the following:    Glucose, Bld 100 (*)    GFR calc non Af Amer 52 (*)    GFR calc Af Amer 60 (*)    All other components within normal limits  BLOOD GAS, ARTERIAL - Abnormal; Notable for the following:    pO2, Arterial 79.3 (*)    All other components within normal limits  URINE CULTURE  URINALYSIS, ROUTINE W REFLEX MICROSCOPIC  TYPE AND SCREEN    Imaging Review Dg Chest 2 View  12/29/2012   CLINICAL DATA:  Hemoptysis  EXAM: CHEST  2 VIEW  COMPARISON:  CTA chest dated 12/15/2012  FINDINGS: Minimal patchy bibasilar opacities, likely atelectasis. No pleural effusion or pneumothorax.  The heart is normal in size.  Mild degenerative changes of the visualized thoracolumbar spine.  IMPRESSION: No evidence of acute cardiopulmonary disease.   Electronically Signed   By: Charline Bills M.D.   On: 12/29/2012 11:48    EKG Interpretation   None       MDM   1. Hemoptysis    Apparent hemoptysis, without evidence for pneumonia, PE, significant blood loss or metabolic instability. He is anticoagulated for PE. He is hemodynamically stable. He stable for discharge with home management and followup with a pulmonologist for further evaluation.  Nursing Notes Reviewed/ Care Coordinated, and agree without changes. Applicable Imaging Reviewed.  Interpretation of  Laboratory Data incorporated into ED treatment    Plan: Home Medications- Avelox; Home Treatments and Observation- rest, fluids; return here if the recommended treatment, does not improve the symptoms; Recommended follow up- follow up on call pulmonary, and PCP, for ongoing management     I personally performed the services described in this documentation, which was scribed in my presence. The recorded information has been reviewed and is accurate.   \   Bryce Melter, MD 12/29/12 1530

## 2012-12-29 NOTE — ED Notes (Addendum)
Pt had moderate BM. POC occult blood obtained with a negative result. EDP aware.

## 2012-12-30 LAB — URINE CULTURE
Colony Count: NO GROWTH
Culture: NO GROWTH

## 2012-12-30 LAB — OCCULT BLOOD, POC DEVICE: Fecal Occult Bld: NEGATIVE

## 2013-04-23 ENCOUNTER — Emergency Department (HOSPITAL_COMMUNITY)
Admission: EM | Admit: 2013-04-23 | Discharge: 2013-04-23 | Disposition: A | Discharge status: Home / Self Care | Payer: Non-veteran care | Attending: Emergency Medicine | Admitting: Emergency Medicine

## 2013-04-23 ENCOUNTER — Emergency Department (HOSPITAL_COMMUNITY): Payer: Non-veteran care

## 2013-04-23 ENCOUNTER — Encounter (HOSPITAL_COMMUNITY): Payer: Self-pay | Admitting: Emergency Medicine

## 2013-04-23 DIAGNOSIS — E039 Hypothyroidism, unspecified: Secondary | ICD-10-CM | POA: Insufficient documentation

## 2013-04-23 DIAGNOSIS — Z86718 Personal history of other venous thrombosis and embolism: Secondary | ICD-10-CM | POA: Insufficient documentation

## 2013-04-23 DIAGNOSIS — Z87891 Personal history of nicotine dependence: Secondary | ICD-10-CM | POA: Insufficient documentation

## 2013-04-23 DIAGNOSIS — I10 Essential (primary) hypertension: Secondary | ICD-10-CM | POA: Insufficient documentation

## 2013-04-23 DIAGNOSIS — Z85038 Personal history of other malignant neoplasm of large intestine: Secondary | ICD-10-CM | POA: Insufficient documentation

## 2013-04-23 DIAGNOSIS — I2510 Atherosclerotic heart disease of native coronary artery without angina pectoris: Secondary | ICD-10-CM | POA: Insufficient documentation

## 2013-04-23 DIAGNOSIS — Z9889 Other specified postprocedural states: Secondary | ICD-10-CM | POA: Insufficient documentation

## 2013-04-23 DIAGNOSIS — E7800 Pure hypercholesterolemia, unspecified: Secondary | ICD-10-CM | POA: Insufficient documentation

## 2013-04-23 DIAGNOSIS — Z8719 Personal history of other diseases of the digestive system: Secondary | ICD-10-CM | POA: Insufficient documentation

## 2013-04-23 DIAGNOSIS — E119 Type 2 diabetes mellitus without complications: Secondary | ICD-10-CM | POA: Insufficient documentation

## 2013-04-23 DIAGNOSIS — R110 Nausea: Secondary | ICD-10-CM | POA: Insufficient documentation

## 2013-04-23 DIAGNOSIS — Z87442 Personal history of urinary calculi: Secondary | ICD-10-CM | POA: Insufficient documentation

## 2013-04-23 DIAGNOSIS — K439 Ventral hernia without obstruction or gangrene: Secondary | ICD-10-CM | POA: Insufficient documentation

## 2013-04-23 DIAGNOSIS — R109 Unspecified abdominal pain: Secondary | ICD-10-CM | POA: Insufficient documentation

## 2013-04-23 DIAGNOSIS — Z86711 Personal history of pulmonary embolism: Secondary | ICD-10-CM | POA: Insufficient documentation

## 2013-04-23 DIAGNOSIS — Z79899 Other long term (current) drug therapy: Secondary | ICD-10-CM | POA: Insufficient documentation

## 2013-04-23 LAB — CBC WITH DIFFERENTIAL/PLATELET
BASOS PCT: 1 % (ref 0–1)
Basophils Absolute: 0 10*3/uL (ref 0.0–0.1)
EOS ABS: 0 10*3/uL (ref 0.0–0.7)
EOS PCT: 0 % (ref 0–5)
HEMATOCRIT: 37.2 % — AB (ref 39.0–52.0)
Hemoglobin: 11.8 g/dL — ABNORMAL LOW (ref 13.0–17.0)
LYMPHS PCT: 35 % (ref 12–46)
Lymphs Abs: 1.4 10*3/uL (ref 0.7–4.0)
MCH: 30.7 pg (ref 26.0–34.0)
MCHC: 31.7 g/dL (ref 30.0–36.0)
MCV: 96.9 fL (ref 78.0–100.0)
MONO ABS: 0.5 10*3/uL (ref 0.1–1.0)
Monocytes Relative: 12 % (ref 3–12)
Neutro Abs: 2 10*3/uL (ref 1.7–7.7)
Neutrophils Relative %: 52 % (ref 43–77)
PLATELETS: 196 10*3/uL (ref 150–400)
RBC: 3.84 MIL/uL — ABNORMAL LOW (ref 4.22–5.81)
RDW: 13.4 % (ref 11.5–15.5)
WBC: 3.9 10*3/uL — ABNORMAL LOW (ref 4.0–10.5)

## 2013-04-23 LAB — COMPREHENSIVE METABOLIC PANEL
ALBUMIN: 3.5 g/dL (ref 3.5–5.2)
ALK PHOS: 84 U/L (ref 39–117)
ALT: 7 U/L (ref 0–53)
AST: 11 U/L (ref 0–37)
BILIRUBIN TOTAL: 0.3 mg/dL (ref 0.3–1.2)
BUN: 18 mg/dL (ref 6–23)
CO2: 29 mEq/L (ref 19–32)
CREATININE: 1.45 mg/dL — AB (ref 0.50–1.35)
Calcium: 9.7 mg/dL (ref 8.4–10.5)
Chloride: 103 mEq/L (ref 96–112)
GFR calc non Af Amer: 41 mL/min — ABNORMAL LOW (ref >90)
GFR, EST AFRICAN AMERICAN: 48 mL/min — AB (ref >90)
GLUCOSE: 113 mg/dL — AB (ref 70–99)
POTASSIUM: 4.2 meq/L (ref 3.7–5.3)
Sodium: 142 mEq/L (ref 137–147)
Total Protein: 7.1 g/dL (ref 6.0–8.3)

## 2013-04-23 LAB — URINALYSIS, ROUTINE W REFLEX MICROSCOPIC
Bilirubin Urine: NEGATIVE
Glucose, UA: NEGATIVE mg/dL
Hgb urine dipstick: NEGATIVE
KETONES UR: NEGATIVE mg/dL
Leukocytes, UA: NEGATIVE
NITRITE: NEGATIVE
PH: 5.5 (ref 5.0–8.0)
Protein, ur: NEGATIVE mg/dL
Specific Gravity, Urine: 1.01 (ref 1.005–1.030)
Urobilinogen, UA: 0.2 mg/dL (ref 0.0–1.0)

## 2013-04-23 LAB — LIPASE, BLOOD: LIPASE: 40 U/L (ref 11–59)

## 2013-04-23 LAB — PROTIME-INR
INR: 1.19 (ref 0.00–1.49)
Prothrombin Time: 14.8 seconds (ref 11.6–15.2)

## 2013-04-23 LAB — APTT: aPTT: 43 seconds — ABNORMAL HIGH (ref 24–37)

## 2013-04-23 MED ORDER — ONDANSETRON 4 MG PO TBDP: ORAL_TABLET | ORAL | Status: DC

## 2013-04-23 MED ORDER — ONDANSETRON HCL 4 MG/2ML IJ SOLN
4.0000 mg | Freq: Once | INTRAMUSCULAR | Status: AC
  Administered 2013-04-23: 4 mg via INTRAVENOUS
  Filled 2013-04-23: qty 2

## 2013-04-23 MED ORDER — IOHEXOL 300 MG/ML  SOLN
100.0000 mL | Freq: Once | INTRAMUSCULAR | Status: AC | PRN
  Administered 2013-04-23: 100 mL via INTRAVENOUS

## 2013-04-23 MED ORDER — SODIUM CHLORIDE 0.9 % IV BOLUS (SEPSIS)
500.0000 mL | Freq: Once | INTRAVENOUS | Status: AC
  Administered 2013-04-23: 500 mL via INTRAVENOUS

## 2013-04-23 MED ORDER — SODIUM CHLORIDE 0.9 % IJ SOLN
INTRAMUSCULAR | Status: AC
  Filled 2013-04-23: qty 250

## 2013-04-23 MED ORDER — IOHEXOL 300 MG/ML  SOLN
50.0000 mL | Freq: Once | INTRAMUSCULAR | Status: AC | PRN
  Administered 2013-04-23: 50 mL via ORAL

## 2013-04-23 MED ORDER — MORPHINE SULFATE 2 MG/ML IJ SOLN
2.0000 mg | Freq: Once | INTRAMUSCULAR | Status: AC
  Administered 2013-04-23: 2 mg via INTRAVENOUS
  Filled 2013-04-23: qty 1

## 2013-04-23 NOTE — ED Notes (Signed)
Pt to be discharged home. Pt's daughter had to go pick up her grandson then will return

## 2013-04-23 NOTE — Care Management Note (Signed)
Pt goes to the [facility] [place] [facility] in [place] for care. He does not have a local PCP.

## 2013-04-23 NOTE — Discharge Instructions (Signed)
CT scan was normal.   Tylenol or Ibuprofen for pain.  Follow up your dr.  Rx for nausea.

## 2013-04-23 NOTE — ED Provider Notes (Signed)
CSN: [id number]     Arrival date & time 04/23/13  1122 History  This chart was scribed for Nat Christen, MD by Ludger Nutting, ED Scribe. This patient was seen in room APA19/APA19 and the patient's care was started 11:42 AM.    Chief Complaint  Patient presents with  . Abdominal Pain      The history is provided by the patient. No language interpreter was used.    HPI Comments: Level V caveat for mild dementia. Bryce Mcdonald is a 80 y.o. male with past medical history of DM, HTN, hypercholesterolemia, CAD, DVT, PE who presents to the Emergency Department complaining of constant sharp lower abdominal pain with associated nausea that began after eating a sausage biscuit this morning. He also reports not taking regular medications for the past 4 days. He denies trouble with BM's. He denies melena, fever, chills, dysuria, chest pain, shortness of breath   Past Medical History  Diagnosis Date  . Colon cancer     Status post partial colectomy  . Hypertension   . Hypercholesterolemia   . Blood transfusion   . Coronary artery disease 2011    Non obstructive CAD  . DVT (deep venous thrombosis)   . PE (pulmonary embolism)   . Diabetes mellitus without complication   . A-fib   . Diabetes mellitus, type II   . Ventral hernia   . Right bundle branch block   . Unspecified hypothyroidism   . LVH (left ventricular hypertrophy) 10/10/2012    Ejection fraction 60-65%.  . Bilateral renal cysts   . Calculus of left kidney     non-obstructing   Past Surgical History  Procedure Laterality Date  . Abdominal surgery      x 3  . Cardiac catheterization  2008   No family history on file. History  Substance Use Topics  . Smoking status: Former Research scientist (life sciences)  . Smokeless tobacco: Not on file     Comment: quit over 40 years ago  . Alcohol Use: No     Comment: quit over forty years ago    Review of Systems  Unable to perform ROS: Other    A complete 10 system review of systems was obtained and all  systems are negative except as noted in the HPI and PMH.    Allergies  Codeine  Home Medications   Current Outpatient Rx  Name  Route  Sig  Dispense  Refill  . albuterol (PROVENTIL HFA) 108 (90 BASE) MCG/ACT inhaler   Inhalation   Inhale 2 puffs into the lungs every 6 (six) hours as needed for wheezing or shortness of breath.         Marland Kitchen atorvastatin (LIPITOR) 80 MG tablet   Oral   Take 80 mg by mouth daily.         . famotidine (PEPCID) 20 MG tablet   Oral   Take 1 tablet (20 mg total) by mouth 2 (two) times daily.         . furosemide (LASIX) 20 MG tablet   Oral   Take 20 mg by mouth every morning.         . isosorbide mononitrate (IMDUR) 30 MG 24 hr tablet   Oral   Take 1 tablet (30 mg total) by mouth daily. Long acting nitroglycerin medicine for your heart.   30 tablet   6   . levothyroxine (SYNTHROID, LEVOTHROID) 50 MCG tablet   Oral   Take 50 mcg by mouth daily before breakfast.         .  lisinopril (PRINIVIL,ZESTRIL) 10 MG tablet   Oral   Take 5 mg by mouth daily.         . metoprolol tartrate (LOPRESSOR) 25 MG tablet   Oral   Take 12.5 mg by mouth 2 (two) times daily.         Marland Kitchen terazosin (HYTRIN) 1 MG capsule   Oral   Take 1 mg by mouth at bedtime.         . ondansetron (ZOFRAN ODT) 4 MG disintegrating tablet      4mg  ODT q4 hours prn nausea/vomit   10 tablet   0    BP 105/60  Pulse 64  Temp(Src) 97.4 F (36.3 C) (Oral)  Resp 20  Ht 5\' 8"  (1.727 m)  Wt 180 lb (81.647 kg)  BMI 27.38 kg/m2  SpO2 94% Physical Exam  Nursing note and vitals reviewed. Constitutional: He is oriented to person, place, and time. He appears well-developed and well-nourished.  HENT:  Head: Normocephalic and atraumatic.  Eyes: Conjunctivae and EOM are normal. Pupils are equal, round, and reactive to light.  Neck: Normal range of motion. Neck supple.  Cardiovascular: Normal rate, regular rhythm and normal heart sounds.   Pulmonary/Chest: Effort normal  and breath sounds normal.  Abdominal: Soft. Bowel sounds are normal. A hernia is present. Hernia confirmed positive in the ventral area.  Old vertical abdominal scar noted. Ventral hernia to RLQ (old)  Musculoskeletal: Normal range of motion.  Neurological: He is alert and oriented to person, place, and time.  Skin: Skin is warm and dry.  Psychiatric: He has a normal mood and affect. His behavior is normal.    ED Course  Procedures (including critical care time)  DIAGNOSTIC STUDIES: Oxygen Saturation is 94% on RA, adequate by my interpretation.    COORDINATION OF CARE: 11:44 AM Discussed treatment plan with pt at bedside and pt agreed to plan.   Labs Review Labs Reviewed  CBC WITH DIFFERENTIAL - Abnormal; Notable for the following:    WBC 3.9 (*)    RBC 3.84 (*)    Hemoglobin 11.8 (*)    HCT 37.2 (*)    All other components within normal limits  APTT - Abnormal; Notable for the following:    aPTT 43 (*)    All other components within normal limits  COMPREHENSIVE METABOLIC PANEL - Abnormal; Notable for the following:    Glucose, Bld 113 (*)    Creatinine, Ser 1.45 (*)    GFR calc non Af Amer 41 (*)    GFR calc Af Amer 48 (*)    All other components within normal limits  PROTIME-INR  LIPASE, BLOOD  URINALYSIS, ROUTINE W REFLEX MICROSCOPIC   Imaging Review Ct Abdomen Pelvis W Contrast  04/23/2013   CLINICAL DATA:  Lower abdominal pain, history of colon cancer  EXAM: CT ABDOMEN AND PELVIS WITH CONTRAST  TECHNIQUE: Multidetector CT imaging of the abdomen and pelvis was performed using the standard protocol following bolus administration of intravenous contrast.  CONTRAST:  175mL OMNIPAQUE IOHEXOL 300 MG/ML SOLN, 79mL OMNIPAQUE IOHEXOL 300 MG/ML SOLN  COMPARISON:  12/04/2012  FINDINGS: Mild scarring at the bilateral lung bases.  Trace pericardial fluid.  Liver, spleen, pancreas, and adrenal glands are within normal limits.  Possible tiny layering gallstone (series 2/ image 24),  without associated inflammatory changes. No intrahepatic or extrahepatic ductal dilatation.  Bilateral renal cysts, including a 2.9 cm anterior left upper pole renal cyst (series 2/image 28), a 2.9 cm interpolar left renal sinus  cyst (series 2/ image 33), and a 12 mm interpolar right renal sinus cyst (series 2/image 28). No enhancing renal lesions. No hydronephrosis.  No evidence of bowel obstruction. Normal appendix. Colonic diverticulosis, without associated inflammatory changes. Prior distal colonic resection with two suture lines in the lower pelvis (series 2/images 60 and 67).  Atherosclerotic calcifications of the abdominal aorta and branch vessels. Stable narrowing/ irregularity of the infrarenal IVC with adjacent linear retroperitoneal calcifications (series 2/image 40) and chronic calcifications within the bilateral common iliac veins (Series 2/image 50).  Prostate is unremarkable.  Bladder is mildly thick-walled but underdistended.  Diastases of the midline anterior abdominal wall with herniation of fat (series 2/image 37) and loops of nondilated small bowel (series 2/ images 33 and 49). Small fat containing right inguinal hernia.  Degenerative changes of the visualized thoracolumbar spine.  IMPRESSION: No evidence of bowel obstruction. Normal appendix. Colonic diverticulosis, without associated inflammatory changes to suggest acute diverticulitis.  Prior distal colonic resection in this patient with history of rectal cancer. No evidence of recurrent or metastatic disease in the abdomen/pelvis.  Stable midline ventral hernias containing fat and loops of nondilated small bowel.  Additional chronic ancillary findings as above.   Electronically Signed   By: Julian Hy M.D.   On: 04/23/2013 15:22   Dg Chest Portable 1 View  04/23/2013   CLINICAL DATA:  History of lung malignancy now with abdominal discomfort  EXAM: PORTABLE CHEST - 1 VIEW  COMPARISON:  None.  FINDINGS: The lungs are mildly  hyperinflated. There is no focal infiltrate. There is no pleural effusion. There is stable blunting of the right lateral costophrenic angle. The cardiac silhouette is normal in size. The pulmonary vascularity is not engorged. There is mild tortuosity of the descending thoracic aorta. The mediastinum is normal in width.  IMPRESSION: There is no evidence of pneumonia nor CHF. There are findings consistent with COPD.   Electronically Signed   By: David  Martinique   On: 04/23/2013 12:55     EKG Interpretation   Date/Time:  Wednesday April 23 2013 12:29:06 EST Ventricular Rate:  62 PR Interval:    QRS Duration: 122 QT Interval:  432 QTC Calculation: 438 R Axis:   -95 Text Interpretation:  Atrial fibrillation Right bundle branch block  Anterior infarct (cited on or before 29-Sep-2012) Abnormal ECG When  compared with ECG of 15-Dec-2012 18:19, Criteria for Inferior infarct are  no longer Present Confirmed by Lavon Horn  MD, Tayt Moyers ([id number]) on 04/23/2013  12:35:48 PM      MDM   Final diagnoses:  Abdominal pain  CT scan shows no acute findings.  No acute abdomen.  Rx for Zofran  I personally performed the services described in this documentation, which was scribed in my presence. The recorded information has been reviewed and is accurate.   Nat Christen, MD 04/23/13 [phone]

## 2013-04-23 NOTE — ED Notes (Signed)
Pt's daughter called at [phone] to bring pt home. Ride on the way

## 2013-04-23 NOTE — ED Notes (Signed)
Complain of abdominal pain that started this morning after eating sausage

## 2013-04-23 NOTE — ED Notes (Signed)
Daughter here now. States she took her dad to the [place] yesterday. States they drew blood work but she does not know the result. States they did tell her his blood was to thick. States this morning her dad was just not acting right

## 2013-06-23 ENCOUNTER — Ambulatory Visit: Payer: Non-veteran care | Admitting: Cardiovascular Disease

## 2013-06-26 ENCOUNTER — Emergency Department (HOSPITAL_COMMUNITY): Payer: Non-veteran care

## 2013-06-26 ENCOUNTER — Encounter (HOSPITAL_COMMUNITY): Payer: Self-pay | Admitting: Emergency Medicine

## 2013-06-26 ENCOUNTER — Emergency Department (HOSPITAL_COMMUNITY)
Admission: EM | Admit: 2013-06-26 | Discharge: 2013-06-26 | Disposition: A | Discharge status: Home / Self Care | Payer: Non-veteran care | Attending: Emergency Medicine | Admitting: Emergency Medicine

## 2013-06-26 DIAGNOSIS — Z86711 Personal history of pulmonary embolism: Secondary | ICD-10-CM | POA: Insufficient documentation

## 2013-06-26 DIAGNOSIS — Z85038 Personal history of other malignant neoplasm of large intestine: Secondary | ICD-10-CM | POA: Insufficient documentation

## 2013-06-26 DIAGNOSIS — E785 Hyperlipidemia, unspecified: Secondary | ICD-10-CM | POA: Insufficient documentation

## 2013-06-26 DIAGNOSIS — I82409 Acute embolism and thrombosis of unspecified deep veins of unspecified lower extremity: Secondary | ICD-10-CM | POA: Insufficient documentation

## 2013-06-26 DIAGNOSIS — I455 Other specified heart block: Secondary | ICD-10-CM | POA: Insufficient documentation

## 2013-06-26 DIAGNOSIS — E039 Hypothyroidism, unspecified: Secondary | ICD-10-CM | POA: Insufficient documentation

## 2013-06-26 DIAGNOSIS — I509 Heart failure, unspecified: Secondary | ICD-10-CM | POA: Insufficient documentation

## 2013-06-26 DIAGNOSIS — E119 Type 2 diabetes mellitus without complications: Secondary | ICD-10-CM | POA: Insufficient documentation

## 2013-06-26 DIAGNOSIS — I4891 Unspecified atrial fibrillation: Secondary | ICD-10-CM | POA: Insufficient documentation

## 2013-06-26 DIAGNOSIS — Z87891 Personal history of nicotine dependence: Secondary | ICD-10-CM | POA: Insufficient documentation

## 2013-06-26 DIAGNOSIS — Z79899 Other long term (current) drug therapy: Secondary | ICD-10-CM | POA: Insufficient documentation

## 2013-06-26 DIAGNOSIS — I517 Cardiomegaly: Secondary | ICD-10-CM | POA: Insufficient documentation

## 2013-06-26 DIAGNOSIS — D61818 Other pancytopenia: Secondary | ICD-10-CM | POA: Insufficient documentation

## 2013-06-26 DIAGNOSIS — I2510 Atherosclerotic heart disease of native coronary artery without angina pectoris: Secondary | ICD-10-CM | POA: Insufficient documentation

## 2013-06-26 DIAGNOSIS — I10 Essential (primary) hypertension: Secondary | ICD-10-CM | POA: Insufficient documentation

## 2013-06-26 DIAGNOSIS — B5801 Toxoplasma chorioretinitis: Secondary | ICD-10-CM | POA: Insufficient documentation

## 2013-06-26 DIAGNOSIS — Z9889 Other specified postprocedural states: Secondary | ICD-10-CM | POA: Insufficient documentation

## 2013-06-26 LAB — BASIC METABOLIC PANEL
BUN: 17 mg/dL (ref 6–23)
CALCIUM: 9.4 mg/dL (ref 8.4–10.5)
CHLORIDE: 105 meq/L (ref 96–112)
CO2: 28 mEq/L (ref 19–32)
Creatinine, Ser: 1.11 mg/dL (ref 0.50–1.35)
GFR calc Af Amer: 66 mL/min — ABNORMAL LOW (ref >90)
GFR calc non Af Amer: 57 mL/min — ABNORMAL LOW (ref >90)
Glucose, Bld: 109 mg/dL — ABNORMAL HIGH (ref 70–99)
Potassium: 3.9 mEq/L (ref 3.7–5.3)
Sodium: 143 mEq/L (ref 137–147)

## 2013-06-26 LAB — CBC WITH DIFFERENTIAL/PLATELET
BASOS ABS: 0 10*3/uL (ref 0.0–0.1)
BASOS PCT: 0 % (ref 0–1)
Eosinophils Absolute: 0 10*3/uL (ref 0.0–0.7)
Eosinophils Relative: 1 % (ref 0–5)
HCT: 34.1 % — ABNORMAL LOW (ref 39.0–52.0)
HEMOGLOBIN: 11 g/dL — AB (ref 13.0–17.0)
Lymphocytes Relative: 58 % — ABNORMAL HIGH (ref 12–46)
Lymphs Abs: 1.5 10*3/uL (ref 0.7–4.0)
MCH: 31.3 pg (ref 26.0–34.0)
MCHC: 32.3 g/dL (ref 30.0–36.0)
MCV: 96.9 fL (ref 78.0–100.0)
MONO ABS: 0.3 10*3/uL (ref 0.1–1.0)
Monocytes Relative: 12 % (ref 3–12)
NEUTROS PCT: 29 % — AB (ref 43–77)
Neutro Abs: 0.7 10*3/uL — ABNORMAL LOW (ref 1.7–7.7)
Platelets: 141 10*3/uL — ABNORMAL LOW (ref 150–400)
RBC: 3.52 MIL/uL — ABNORMAL LOW (ref 4.22–5.81)
RDW: 15.5 % (ref 11.5–15.5)
WBC: 2.5 10*3/uL — ABNORMAL LOW (ref 4.0–10.5)

## 2013-06-26 LAB — TROPONIN I: Troponin I: <0.3 ng/mL (ref <0.30)

## 2013-06-26 LAB — PRO B NATRIURETIC PEPTIDE: Pro B Natriuretic peptide (BNP): 1461 pg/mL — ABNORMAL HIGH (ref 0–450)

## 2013-06-26 LAB — PATHOLOGIST SMEAR REVIEW

## 2013-06-26 MED ORDER — FUROSEMIDE 10 MG/ML IJ SOLN
40.0000 mg | Freq: Once | INTRAMUSCULAR | Status: AC
  Administered 2013-06-26: 40 mg via INTRAVENOUS
  Filled 2013-06-26: qty 4

## 2013-06-26 NOTE — ED Notes (Signed)
Pt states the fluid is building up and its hard to breathe.

## 2013-06-26 NOTE — ED Provider Notes (Signed)
CSN: [id number]     Arrival date & time 06/26/13  0319 History   First MD Initiated Contact with Patient 06/26/13 0325     Chief Complaint  Patient presents with  . Shortness of Breath     (Consider location/radiation/quality/duration/timing/severity/associated sxs/prior Treatment) Patient is a 80 y.o. male presenting with shortness of breath. The history is provided by the patient.  Shortness of Breath He complains of worsening difficulty breathing over the last several weeks. Dyspnea is worse with exertion and worse with laying flat. He denies fever chills or sweats. There has been some vague chest tightness. There's been no nausea or vomiting or diaphoresis. He has had a slight cough which is nonproductive. He has noted some abdominal distention which made him think that he might be retaining fluid. He does have chronic leg swelling which has not changed from baseline. He has not done anything at home to help with his breathing.  Past Medical History  Diagnosis Date  . Colon cancer     Status post partial colectomy  . Hypertension   . Hypercholesterolemia   . Blood transfusion   . Coronary artery disease 2011    Non obstructive CAD  . DVT (deep venous thrombosis)   . PE (pulmonary embolism)   . Diabetes mellitus without complication   . A-fib   . Diabetes mellitus, type II   . Ventral hernia   . Right bundle branch block   . Unspecified hypothyroidism   . LVH (left ventricular hypertrophy) 10/10/2012    Ejection fraction 60-65%.  . Bilateral renal cysts   . Calculus of left kidney     non-obstructing   Past Surgical History  Procedure Laterality Date  . Abdominal surgery      x 3  . Cardiac catheterization  2008   History reviewed. No pertinent family history. History  Substance Use Topics  . Smoking status: Former Research scientist (life sciences)  . Smokeless tobacco: Not on file     Comment: quit over 40 years ago  . Alcohol Use: No     Comment: quit over forty years ago    Review of  Systems  Respiratory: Positive for shortness of breath.   All other systems reviewed and are negative.     Allergies  Codeine  Home Medications   Prior to Admission medications   Medication Sig Start Date End Date Taking? Authorizing Provider  albuterol (PROVENTIL HFA) 108 (90 BASE) MCG/ACT inhaler Inhale 2 puffs into the lungs every 6 (six) hours as needed for wheezing or shortness of breath.    Historical Provider, MD  atorvastatin (LIPITOR) 80 MG tablet Take 80 mg by mouth daily.    Historical Provider, MD  famotidine (PEPCID) 20 MG tablet Take 1 tablet (20 mg total) by mouth 2 (two) times daily. 12/06/12   Radene Gunning, NP  furosemide (LASIX) 20 MG tablet Take 20 mg by mouth every morning.    Historical Provider, MD  isosorbide mononitrate (IMDUR) 30 MG 24 hr tablet Take 1 tablet (30 mg total) by mouth daily. Long acting nitroglycerin medicine for your heart. 10/11/12   Rexene Alberts, MD  levothyroxine (SYNTHROID, LEVOTHROID) 50 MCG tablet Take 50 mcg by mouth daily before breakfast.    Historical Provider, MD  lisinopril (PRINIVIL,ZESTRIL) 10 MG tablet Take 5 mg by mouth daily.    Historical Provider, MD  metoprolol tartrate (LOPRESSOR) 25 MG tablet Take 12.5 mg by mouth 2 (two) times daily.    Historical Provider, MD  ondansetron (ZOFRAN ODT)  4 MG disintegrating tablet 4mg  ODT q4 hours prn nausea/vomit 04/23/13   Nat Christen, MD  terazosin (HYTRIN) 1 MG capsule Take 1 mg by mouth at bedtime.    Historical Provider, MD   BP 158/81  Pulse 64  Temp(Src) 97.6 F (36.4 C)  Resp 22  Ht 5\' 8"  (1.727 m)  Wt 185 lb (83.915 kg)  BMI 28.14 kg/m2  SpO2 97% Physical Exam  Nursing note and vitals reviewed.  80 year old male, resting comfortably and in no acute distress. Vital signs are significant for tachypnea with respiratory rate of 22, and hypertension with blood pressure 150/81. Oxygen saturation is 97%, which is normal. Head is normocephalic and atraumatic. PERRLA, EOMI. Oropharynx  is clear. Neck is nontender and supple without adenopathy. JVD is present when sitting at 90. Back is nontender and there is no CVA tenderness. Lungs have bibasilar rales without wheezes or rhonchi. Chest is nontender. Heart has regular rate and rhythm without murmur. Abdomen is distended, soft, nontender without masses or hepatosplenomegaly and peristalsis is normoactive. Extremities have 1-2+ edema, full range of motion is present. Skin is warm and dry without rash. Neurologic: Mental status is normal, cranial nerves are intact, there are no motor or sensory deficits.  ED Course  Procedures (including critical care time) Labs Review Results for orders placed during the hospital encounter of 06/26/13  CBC WITH DIFFERENTIAL      Result Value Ref Range   WBC 2.5 (*) 4.0 - 10.5 K/uL   RBC 3.52 (*) 4.22 - 5.81 MIL/uL   Hemoglobin 11.0 (*) 13.0 - 17.0 g/dL   HCT 34.1 (*) 39.0 - 52.0 %   MCV 96.9  78.0 - 100.0 fL   MCH 31.3  26.0 - 34.0 pg   MCHC 32.3  30.0 - 36.0 g/dL   RDW 15.5  11.5 - 15.5 %   Platelets 141 (*) 150 - 400 K/uL   Neutrophils Relative % 29 (*) 43 - 77 %   Lymphocytes Relative 58 (*) 12 - 46 %   Monocytes Relative 12  3 - 12 %   Eosinophils Relative 1  0 - 5 %   Basophils Relative 0  0 - 1 %   Neutro Abs 0.7 (*) 1.7 - 7.7 K/uL   Lymphs Abs 1.5  0.7 - 4.0 K/uL   Monocytes Absolute 0.3  0.1 - 1.0 K/uL   Eosinophils Absolute 0.0  0.0 - 0.7 K/uL   Basophils Absolute 0.0  0.0 - 0.1 K/uL   RBC Morphology POLYCHROMASIA PRESENT     WBC Morphology WHITE COUNT CONFIRMED ON SMEAR     Smear Review PENDING PATHOLOGIST REVIEW    BASIC METABOLIC PANEL      Result Value Ref Range   Sodium 143  137 - 147 mEq/L   Potassium 3.9  3.7 - 5.3 mEq/L   Chloride 105  96 - 112 mEq/L   CO2 28  19 - 32 mEq/L   Glucose, Bld 109 (*) 70 - 99 mg/dL   BUN 17  6 - 23 mg/dL   Creatinine, Ser 1.11  0.50 - 1.35 mg/dL   Calcium 9.4  8.4 - 10.5 mg/dL   GFR calc non Af Amer 57 (*) >90 mL/min    GFR calc Af Amer 66 (*) >90 mL/min  PRO B NATRIURETIC PEPTIDE      Result Value Ref Range   Pro B Natriuretic peptide (BNP) 1461.0 (*) 0 - 450 pg/mL  TROPONIN I  Result Value Ref Range   Troponin I <0.30  <0.30 ng/mL   Imaging Review Dg Chest Port 1 View  06/26/2013   CLINICAL DATA:  Dyspnea.  History of colon cancer  EXAM: PORTABLE CHEST - 1 VIEW  COMPARISON:  04/23/2013  FINDINGS: Mild cardiomegaly which is chronic. Chronic blunting of the lateral costophrenic sulci related to scarring. Mild, diffuse interstitial coarsening, especially at the bases.  IMPRESSION: 1. Interstitial coarsening which could be congestive or bronchitic. 2. Chronic basilar pleural scarring.   Electronically Signed   By: Jorje Guild M.D.   On: 06/26/2013 04:14   Images viewed by me.   EKG Interpretation   Date/Time:  Thursday Jun 26 2013 03:31:07 EDT Ventricular Rate:  60 PR Interval:    QRS Duration: 126 QT Interval:  452 QTC Calculation: 452 R Axis:   -94 Text Interpretation:  Atrial fibrillation Right bundle branch block  Anterior infarct , age undetermined Abnormal ECG When compared with ECG of  04/23/2013, No significant change was found Confirmed by [facility]  MD, Meygan Kyser  ([id number]) on 06/26/2013 3:36:13 AM      MDM   Final diagnoses:  CHF exacerbation  Pancytopenia    Dyspnea that seems to be from congestive heart failure. He has peripheral edema and neck vein distention and bibasilar rales. Chest x-ray or be obtained as well as screening labs including BNP.  Chest x-ray and laboratory workup were consistent with CHF exacerbation with elevated BNP. Pancytopenia is noted with no value significantly changed from previous values. He is given a dose of furosemide and observed in the ED. He had good diuresis and was resting comfortably and states that he is breathing had improved and he felt comfortable going home. He is discharged with instructions to increase his furosemide dose from 20 mg a day to 40  mg a day for the next 5 days. He is to followup with his PCP at bedtime can give him instructions on how to dose his furosemide going forward.  Delora Fuel, MD 37/62/83 [id number]

## 2013-06-26 NOTE — Discharge Instructions (Signed)
Double your dose of Furosemide (Lasix) for the next five days. Talk with your doctor for instructions on how much Furosemide to take after those five days.   Heart Failure Heart failure is a condition in which the heart has trouble pumping blood. This means your heart does not pump blood efficiently for your body to work well. In some cases of heart failure, fluid may back up into your lungs or you may have swelling (edema) in your lower legs. Heart failure is usually a long-term (chronic) condition. It is important for you to take good care of yourself and follow your caregiver's treatment plan. CAUSES  Some health conditions can cause heart failure. Those health conditions include:  High blood pressure (hypertension) causes the heart muscle to work harder than normal. When pressure in the blood vessels is high, the heart needs to pump (contract) with more force in order to circulate blood throughout the body. High blood pressure eventually causes the heart to become stiff and weak.  Coronary artery disease (CAD) is the buildup of cholesterol and fat (plaque) in the arteries of the heart. The blockage in the arteries deprives the heart muscle of oxygen and blood. This can cause chest pain and may lead to a heart attack. High blood pressure can also contribute to CAD.  Heart attack (myocardial infarction) occurs when 1 or more arteries in the heart become blocked. The loss of oxygen damages the muscle tissue of the heart. When this happens, part of the heart muscle dies. The injured tissue does not contract as well and weakens the heart's ability to pump blood.  Abnormal heart valves can cause heart failure when the heart valves do not open and close properly. This makes the heart muscle pump harder to keep the blood flowing.  Heart muscle disease (cardiomyopathy or myocarditis) is damage to the heart muscle from a variety of causes. These can include drug or alcohol abuse, infections, or unknown  reasons. These can increase the risk of heart failure.  Lung disease makes the heart work harder because the lungs do not work properly. This can cause a strain on the heart, leading it to fail.  Diabetes increases the risk of heart failure. High blood sugar contributes to high fat (lipid) levels in the blood. Diabetes can also cause slow damage to tiny blood vessels that carry important nutrients to the heart muscle. When the heart does not get enough oxygen and food, it can cause the heart to become weak and stiff. This leads to a heart that does not contract efficiently.  Other conditions can contribute to heart failure. These include abnormal heart rhythms, thyroid problems, and low blood counts (anemia). Certain unhealthy behaviors can increase the risk of heart failure. Those unhealthy behaviors include:  Being overweight.  Smoking or chewing tobacco.  Eating foods high in fat and cholesterol.  Abusing illicit drugs or alcohol.  Lacking physical activity. SYMPTOMS  Heart failure symptoms may vary and can be hard to detect. Symptoms may include:  Shortness of breath with activity, such as climbing stairs.  Persistent cough.  Swelling of the feet, ankles, legs, or abdomen.  Unexplained weight gain.  Difficulty breathing when lying flat (orthopnea).  Waking from sleep because of the need to sit up and get more air.  Rapid heartbeat.  Fatigue and loss of energy.  Feeling lightheaded, dizzy, or close to fainting.  Loss of appetite.  Nausea.  Increased urination during the night (nocturia). DIAGNOSIS  A diagnosis of heart failure is  based on your history, symptoms, physical examination, and diagnostic tests. Diagnostic tests for heart failure may include:  Echocardiography.  Electrocardiography.  Chest X-ray.  Blood tests.  Exercise stress test.  Cardiac angiography.  Radionuclide scans. TREATMENT  Treatment is aimed at managing the symptoms of heart  failure. Medicines, behavioral changes, or surgical intervention may be necessary to treat heart failure.  Medicines to help treat heart failure may include:  Angiotensin-converting enzyme (ACE) inhibitors. This type of medicine blocks the effects of a blood protein called angiotensin-converting enzyme. ACE inhibitors relax (dilate) the blood vessels and help lower blood pressure.  Angiotensin receptor blockers. This type of medicine blocks the actions of a blood protein called angiotensin. Angiotensin receptor blockers dilate the blood vessels and help lower blood pressure.  Water pills (diuretics). Diuretics cause the kidneys to remove salt and water from the blood. The extra fluid is removed through urination. This loss of extra fluid lowers the volume of blood the heart pumps.  Beta blockers. These prevent the heart from beating too fast and improve heart muscle strength.  Digitalis. This increases the force of the heartbeat.  Healthy behavior changes include:  Obtaining and maintaining a healthy weight.  Stopping smoking or chewing tobacco.  Eating heart healthy foods.  Limiting or avoiding alcohol.  Stopping illicit drug use.  Physical activity as directed by your caregiver.  Surgical treatment for heart failure may include:  A procedure to open blocked arteries, repair damaged heart valves, or remove damaged heart muscle tissue.  A pacemaker to improve heart muscle function and control certain abnormal heart rhythms.  An internal cardioverter defibrillator to treat certain serious abnormal heart rhythms.  A left ventricular assist device to assist the pumping ability of the heart. HOME CARE INSTRUCTIONS   Take your medicine as directed by your caregiver. Medicines are important in reducing the workload of your heart, slowing the progression of heart failure, and improving your symptoms.  Do not stop taking your medicine unless directed by your caregiver.  Do not skip  any dose of medicine.  Refill your prescriptions before you run out of medicine. Your medicines are needed every day.  Take over-the-counter medicine only as directed by your caregiver or pharmacist.  Engage in moderate physical activity if directed by your caregiver. Moderate physical activity can benefit some people. The elderly and people with severe heart failure should consult with a caregiver for physical activity recommendations.  Eat heart healthy foods. Food choices should be free of trans fat and low in saturated fat, cholesterol, and salt (sodium). Healthy choices include fresh or frozen fruits and vegetables, fish, lean meats, legumes, fat-free or low-fat dairy products, and whole grain or high fiber foods. Talk to a dietitian to learn more about heart healthy foods.  Limit sodium if directed by your caregiver. Sodium restriction may reduce symptoms of heart failure in some people. Talk to a dietitian to learn more about heart healthy seasonings.  Use healthy cooking methods. Healthy cooking methods include roasting, grilling, broiling, baking, poaching, steaming, or stir-frying. Talk to a dietitian to learn more about healthy cooking methods.  Limit fluids if directed by your caregiver. Fluid restriction may reduce symptoms of heart failure in some people.  Weigh yourself every day. Daily weights are important in the early recognition of excess fluid. You should weigh yourself every morning after you urinate and before you eat breakfast. Wear the same amount of clothing each time you weigh yourself. Record your daily weight. Provide your caregiver with  your weight record.  Monitor and record your blood pressure if directed by your caregiver.  Check your pulse if directed by your caregiver.  Lose weight if directed by your caregiver. Weight loss may reduce symptoms of heart failure in some people.  Stop smoking or chewing tobacco. Nicotine makes your heart work harder by causing  your blood vessels to constrict. Do not use nicotine gum or patches before talking to your caregiver.  Schedule and attend follow-up visits as directed by your caregiver. It is important to keep all your appointments.  Limit alcohol intake to no more than 1 drink per day for nonpregnant women and 2 drinks per day for men. Drinking more than that is harmful to your heart. Tell your caregiver if you drink alcohol several times a week. Talk with your caregiver about whether alcohol is safe for you. If your heart has already been damaged by alcohol or you have severe heart failure, drinking alcohol should be stopped completely.  Stop illicit drug use.  Stay up-to-date with immunizations. It is especially important to prevent respiratory infections through current pneumococcal and influenza immunizations.  Manage other health conditions such as hypertension, diabetes, thyroid disease, or abnormal heart rhythms as directed by your caregiver.  Learn to manage stress.  Plan rest periods when fatigued.  Learn strategies to manage high temperatures. If the weather is extremely hot:  Avoid vigorous physical activity.  Use air conditioning or fans or seek a cooler location.  Avoid caffeine and alcohol.  Wear loose-fitting, lightweight, and light-colored clothing.  Learn strategies to manage cold temperatures. If the weather is extremely cold:  Avoid vigorous physical activity.  Layer clothes.  Wear mittens or gloves, a hat, and a scarf when going outside.  Avoid alcohol.  Obtain ongoing education and support as needed.  Participate or seek rehabilitation as needed to maintain or improve independence and quality of life. SEEK MEDICAL CARE IF:   Your weight increases by 03 lb/1.4 kg in 1 day or 05 lb/2.3 kg in a week.  You have increasing shortness of breath that is unusual for you.  You are unable to participate in your usual physical activities.  You tire easily.  You cough more  than normal, especially with physical activity.  You have any or more swelling in areas such as your hands, feet, ankles, or abdomen.  You are unable to sleep because it is hard to breathe.  You feel like your heart is beating fast (palpitations).  You become dizzy or lightheaded upon standing up. SEEK IMMEDIATE MEDICAL CARE IF:   You have difficulty breathing.  There is a change in mental status such as decreased alertness or difficulty with concentration.  You have a pain or discomfort in your chest.  You have an episode of fainting (syncope). MAKE SURE YOU:   Understand these instructions.  Will watch your condition.  Will get help right away if you are not doing well or get worse. Document Released: 02/06/2005 Document Revised: 06/03/2012 Document Reviewed: 02/29/2012 [facility] Patient Information 2014 [place], [place].

## 2013-06-26 NOTE — ED Notes (Signed)
nad noted prior to dc dc instructions reviewed and explained. Pt waiting for his wife to arrive.

## 2013-06-26 NOTE — ED Notes (Signed)
Family here to transport pt home

## 2013-07-09 ENCOUNTER — Emergency Department (HOSPITAL_COMMUNITY): Payer: Non-veteran care

## 2013-07-09 ENCOUNTER — Emergency Department (HOSPITAL_COMMUNITY)
Admission: EM | Admit: 2013-07-09 | Discharge: 2013-07-09 | Disposition: A | Discharge status: Home / Self Care | Payer: Non-veteran care | Attending: Emergency Medicine | Admitting: Emergency Medicine

## 2013-07-09 ENCOUNTER — Encounter (HOSPITAL_COMMUNITY): Payer: Self-pay | Admitting: Emergency Medicine

## 2013-07-09 DIAGNOSIS — Z85038 Personal history of other malignant neoplasm of large intestine: Secondary | ICD-10-CM | POA: Insufficient documentation

## 2013-07-09 DIAGNOSIS — E119 Type 2 diabetes mellitus without complications: Secondary | ICD-10-CM | POA: Insufficient documentation

## 2013-07-09 DIAGNOSIS — R609 Edema, unspecified: Secondary | ICD-10-CM | POA: Insufficient documentation

## 2013-07-09 DIAGNOSIS — I2510 Atherosclerotic heart disease of native coronary artery without angina pectoris: Secondary | ICD-10-CM | POA: Insufficient documentation

## 2013-07-09 DIAGNOSIS — I4891 Unspecified atrial fibrillation: Secondary | ICD-10-CM | POA: Insufficient documentation

## 2013-07-09 DIAGNOSIS — I4510 Unspecified right bundle-branch block: Secondary | ICD-10-CM | POA: Insufficient documentation

## 2013-07-09 DIAGNOSIS — I10 Essential (primary) hypertension: Secondary | ICD-10-CM | POA: Insufficient documentation

## 2013-07-09 DIAGNOSIS — Z87442 Personal history of urinary calculi: Secondary | ICD-10-CM | POA: Insufficient documentation

## 2013-07-09 DIAGNOSIS — Z87891 Personal history of nicotine dependence: Secondary | ICD-10-CM | POA: Insufficient documentation

## 2013-07-09 DIAGNOSIS — Z9889 Other specified postprocedural states: Secondary | ICD-10-CM | POA: Insufficient documentation

## 2013-07-09 DIAGNOSIS — I517 Cardiomegaly: Secondary | ICD-10-CM | POA: Insufficient documentation

## 2013-07-09 DIAGNOSIS — E785 Hyperlipidemia, unspecified: Secondary | ICD-10-CM | POA: Insufficient documentation

## 2013-07-09 DIAGNOSIS — E039 Hypothyroidism, unspecified: Secondary | ICD-10-CM | POA: Insufficient documentation

## 2013-07-09 DIAGNOSIS — Z87448 Personal history of other diseases of urinary system: Secondary | ICD-10-CM | POA: Insufficient documentation

## 2013-07-09 DIAGNOSIS — Z86718 Personal history of other venous thrombosis and embolism: Secondary | ICD-10-CM | POA: Insufficient documentation

## 2013-07-09 DIAGNOSIS — Z79899 Other long term (current) drug therapy: Secondary | ICD-10-CM | POA: Insufficient documentation

## 2013-07-09 DIAGNOSIS — Z86711 Personal history of pulmonary embolism: Secondary | ICD-10-CM | POA: Insufficient documentation

## 2013-07-09 LAB — BASIC METABOLIC PANEL
BUN: 20 mg/dL (ref 6–23)
CALCIUM: 9.6 mg/dL (ref 8.4–10.5)
CO2: 29 meq/L (ref 19–32)
CREATININE: 1.34 mg/dL (ref 0.50–1.35)
Chloride: 103 mEq/L (ref 96–112)
GFR calc Af Amer: 52 mL/min — ABNORMAL LOW (ref >90)
GFR, EST NON AFRICAN AMERICAN: 45 mL/min — AB (ref >90)
GLUCOSE: 109 mg/dL — AB (ref 70–99)
Potassium: 4.3 mEq/L (ref 3.7–5.3)
Sodium: 144 mEq/L (ref 137–147)

## 2013-07-09 LAB — CBC WITH DIFFERENTIAL/PLATELET
Basophils Absolute: 0 10*3/uL (ref 0.0–0.1)
Basophils Relative: 0 % (ref 0–1)
EOS ABS: 0 10*3/uL (ref 0.0–0.7)
EOS PCT: 1 % (ref 0–5)
HEMATOCRIT: 35.9 % — AB (ref 39.0–52.0)
Hemoglobin: 11.5 g/dL — ABNORMAL LOW (ref 13.0–17.0)
LYMPHS ABS: 1.6 10*3/uL (ref 0.7–4.0)
LYMPHS PCT: 59 % — AB (ref 12–46)
MCH: 30.7 pg (ref 26.0–34.0)
MCHC: 32 g/dL (ref 30.0–36.0)
MCV: 96 fL (ref 78.0–100.0)
MONO ABS: 0.4 10*3/uL (ref 0.1–1.0)
Monocytes Relative: 13 % — ABNORMAL HIGH (ref 3–12)
Neutro Abs: 0.7 10*3/uL — ABNORMAL LOW (ref 1.7–7.7)
Neutrophils Relative %: 27 % — ABNORMAL LOW (ref 43–77)
Platelets: 147 10*3/uL — ABNORMAL LOW (ref 150–400)
RBC: 3.74 MIL/uL — AB (ref 4.22–5.81)
RDW: 15.3 % (ref 11.5–15.5)
WBC: 2.7 10*3/uL — AB (ref 4.0–10.5)

## 2013-07-09 NOTE — Discharge Instructions (Signed)
Double up on your lasix for one week.  And follow up with the cardiologist.

## 2013-07-09 NOTE — ED Provider Notes (Signed)
CSN: [id number]     Arrival date & time 07/09/13  6761 History  This chart was scribed for Maudry Diego, MD by Ludger Nutting, ED Scribe. This patient was seen in room APA06/APA06 and the patient's care was started 10:35 AM.    Chief Complaint  Patient presents with  . Leg Swelling      Patient is a 80 y.o. male presenting with leg pain. The history is provided by the patient. No language interpreter was used.  Leg Pain Location:  Leg, ankle and foot Injury: no   Leg location:  R leg and L leg Ankle location:  L ankle and R ankle Foot location:  L foot and R foot Pain details:    Radiates to:  Does not radiate   Severity:  Mild   Timing:  Constant   Progression:  Worsening Chronicity:  Chronic Prior injury to area:  No Associated symptoms: swelling (bilateral lower extremities)   Associated symptoms: no back pain and no fatigue     HPI Comments: Bryce Mcdonald is a 80 y.o. male with past medical history of HTN, hypercholesterolemia, DVT, PE, DM who presents to the Emergency Department complaining of chronic, constant, bilateral lower leg swelling that worsened over the last month. He also complains of myalgias each night when sleeping. Patient states he was seen on 06/26/13 and was advised to double his Lasix dose from 20 mg to 40 mg for 5 days. Patient states he did this and then returned to taking his normal dose. He states he has been complaint with this medication. He denies chest pain, SOB.   Past Medical History  Diagnosis Date  . Colon cancer     Status post partial colectomy  . Hypertension   . Hypercholesterolemia   . Blood transfusion   . Coronary artery disease 2011    Non obstructive CAD  . DVT (deep venous thrombosis)   . PE (pulmonary embolism)   . Diabetes mellitus without complication   . A-fib   . Diabetes mellitus, type II   . Ventral hernia   . Right bundle branch block   . Unspecified hypothyroidism   . LVH (left ventricular hypertrophy) 10/10/2012     Ejection fraction 60-65%.  . Bilateral renal cysts   . Calculus of left kidney     non-obstructing   Past Surgical History  Procedure Laterality Date  . Abdominal surgery      x 3  . Cardiac catheterization  2008   History reviewed. No pertinent family history. History  Substance Use Topics  . Smoking status: Former Research scientist (life sciences)  . Smokeless tobacco: Not on file     Comment: quit over 40 years ago  . Alcohol Use: No     Comment: quit over forty years ago    Review of Systems  Constitutional: Negative for appetite change and fatigue.  HENT: Negative for congestion, ear discharge and sinus pressure.   Eyes: Negative for discharge.  Respiratory: Negative for cough and shortness of breath.   Cardiovascular: Positive for leg swelling. Negative for chest pain.  Gastrointestinal: Negative for abdominal pain and diarrhea.  Genitourinary: Negative for frequency and hematuria.  Musculoskeletal: Positive for myalgias. Negative for back pain.  Skin: Negative for rash.  Neurological: Negative for seizures and headaches.  Psychiatric/Behavioral: Negative for hallucinations.      Allergies  Codeine  Home Medications   Prior to Admission medications   Medication Sig Start Date End Date Taking? Authorizing Provider  albuterol (PROVENTIL HFA) 108 (  90 BASE) MCG/ACT inhaler Inhale 2 puffs into the lungs every 6 (six) hours as needed for wheezing or shortness of breath.   Yes Historical Provider, MD  atorvastatin (LIPITOR) 80 MG tablet Take 80 mg by mouth daily.   Yes Historical Provider, MD  famotidine (PEPCID) 20 MG tablet Take 1 tablet (20 mg total) by mouth 2 (two) times daily. 12/06/12  Yes Lezlie Octave Black, NP  furosemide (LASIX) 20 MG tablet Take 20 mg by mouth every morning.   Yes Historical Provider, MD  isosorbide mononitrate (IMDUR) 30 MG 24 hr tablet Take 1 tablet (30 mg total) by mouth daily. Long acting nitroglycerin medicine for your heart. 10/11/12  Yes Rexene Alberts, MD   levothyroxine (SYNTHROID, LEVOTHROID) 50 MCG tablet Take 50 mcg by mouth daily before breakfast.   Yes Historical Provider, MD  lisinopril (PRINIVIL,ZESTRIL) 10 MG tablet Take 5 mg by mouth daily.   Yes Historical Provider, MD  metoprolol tartrate (LOPRESSOR) 25 MG tablet Take 12.5 mg by mouth 2 (two) times daily.   Yes Historical Provider, MD  ondansetron (ZOFRAN ODT) 4 MG disintegrating tablet 4mg  ODT q4 hours prn nausea/vomit 04/23/13  Yes Nat Christen, MD  terazosin (HYTRIN) 1 MG capsule Take 1 mg by mouth at bedtime.   Yes Historical Provider, MD   BP 126/56  Pulse 52  Temp(Src) 97.6 F (36.4 C) (Oral)  Resp 18  Ht 5\' 8"  (1.727 m)  Wt 188 lb (85.276 kg)  BMI 28.59 kg/m2  SpO2 96% Physical Exam  Nursing note and vitals reviewed. Constitutional: He is oriented to person, place, and time. He appears well-developed.  HENT:  Head: Normocephalic.  Eyes: Conjunctivae and EOM are normal. No scleral icterus.  Neck: Neck supple. No thyromegaly present.  Cardiovascular: Normal rate, regular rhythm and normal heart sounds.  Exam reveals no gallop and no friction rub.   No murmur heard. Pulmonary/Chest: Effort normal and breath sounds normal. No stridor. He has no wheezes. He has no rales. He exhibits no tenderness.  Abdominal: He exhibits no distension. There is no tenderness. There is no rebound.  Musculoskeletal: Normal range of motion. He exhibits edema (2+ edema to bilateral lower extremities).  Lymphadenopathy:    He has no cervical adenopathy.  Neurological: He is oriented to person, place, and time. He exhibits normal muscle tone. Coordination normal.  Skin: No rash noted. No erythema.  Psychiatric: He has a normal mood and affect. His behavior is normal.    ED Course  Procedures (including critical care time)  DIAGNOSTIC STUDIES: Oxygen Saturation is 96% on RA, adequate by my interpretation.    COORDINATION OF CARE: 10:35 AM Will order CXR, CBC, BMP. Discussed treatment plan  with pt at bedside and pt agreed to plan.   Labs Review Labs Reviewed  CBC WITH DIFFERENTIAL  BASIC METABOLIC PANEL    Imaging Review No results found.   EKG Interpretation None      MDM   Final diagnoses:  None  The chart was scribed for me under my direct supervision.  I personally performed the history, physical, and medical decision making and all procedures in the evaluation of this patient.Maudry Diego, MD 07/09/13 1341

## 2013-07-09 NOTE — ED Notes (Addendum)
C/o leg swelling and feeling funny in his insides.  Problems swallowing.  C/o sob.  Denies any pain

## 2013-07-11 ENCOUNTER — Ambulatory Visit: Payer: Non-veteran care | Admitting: Cardiovascular Disease

## 2013-07-26 ENCOUNTER — Inpatient Hospital Stay (HOSPITAL_COMMUNITY)
Admission: EM | Admit: 2013-07-26 | Discharge: 2013-07-29 | DRG: 291 | Disposition: A | Discharge status: Home / Self Care | Payer: Non-veteran care | Attending: Internal Medicine | Admitting: Internal Medicine

## 2013-07-26 ENCOUNTER — Emergency Department (HOSPITAL_COMMUNITY): Payer: Non-veteran care

## 2013-07-26 ENCOUNTER — Encounter (HOSPITAL_COMMUNITY): Payer: Self-pay | Admitting: Emergency Medicine

## 2013-07-26 DIAGNOSIS — Z86711 Personal history of pulmonary embolism: Secondary | ICD-10-CM

## 2013-07-26 DIAGNOSIS — D696 Thrombocytopenia, unspecified: Secondary | ICD-10-CM

## 2013-07-26 DIAGNOSIS — I5033 Acute on chronic diastolic (congestive) heart failure: Principal | ICD-10-CM

## 2013-07-26 DIAGNOSIS — I4891 Unspecified atrial fibrillation: Secondary | ICD-10-CM | POA: Diagnosis present

## 2013-07-26 DIAGNOSIS — Z86718 Personal history of other venous thrombosis and embolism: Secondary | ICD-10-CM

## 2013-07-26 DIAGNOSIS — Z833 Family history of diabetes mellitus: Secondary | ICD-10-CM

## 2013-07-26 DIAGNOSIS — I509 Heart failure, unspecified: Secondary | ICD-10-CM

## 2013-07-26 DIAGNOSIS — I2510 Atherosclerotic heart disease of native coronary artery without angina pectoris: Secondary | ICD-10-CM | POA: Diagnosis present

## 2013-07-26 DIAGNOSIS — Z7901 Long term (current) use of anticoagulants: Secondary | ICD-10-CM

## 2013-07-26 DIAGNOSIS — J9600 Acute respiratory failure, unspecified whether with hypoxia or hypercapnia: Secondary | ICD-10-CM | POA: Diagnosis present

## 2013-07-26 DIAGNOSIS — I10 Essential (primary) hypertension: Secondary | ICD-10-CM

## 2013-07-26 DIAGNOSIS — R0600 Dyspnea, unspecified: Secondary | ICD-10-CM | POA: Diagnosis present

## 2013-07-26 DIAGNOSIS — Z823 Family history of stroke: Secondary | ICD-10-CM

## 2013-07-26 DIAGNOSIS — R0609 Other forms of dyspnea: Secondary | ICD-10-CM

## 2013-07-26 DIAGNOSIS — Z8249 Family history of ischemic heart disease and other diseases of the circulatory system: Secondary | ICD-10-CM

## 2013-07-26 DIAGNOSIS — Z800 Family history of malignant neoplasm of digestive organs: Secondary | ICD-10-CM

## 2013-07-26 DIAGNOSIS — J449 Chronic obstructive pulmonary disease, unspecified: Secondary | ICD-10-CM | POA: Diagnosis present

## 2013-07-26 DIAGNOSIS — E119 Type 2 diabetes mellitus without complications: Secondary | ICD-10-CM | POA: Diagnosis present

## 2013-07-26 DIAGNOSIS — E7800 Pure hypercholesterolemia, unspecified: Secondary | ICD-10-CM | POA: Diagnosis present

## 2013-07-26 DIAGNOSIS — D72819 Decreased white blood cell count, unspecified: Secondary | ICD-10-CM

## 2013-07-26 DIAGNOSIS — E039 Hypothyroidism, unspecified: Secondary | ICD-10-CM | POA: Diagnosis present

## 2013-07-26 DIAGNOSIS — Z85038 Personal history of other malignant neoplasm of large intestine: Secondary | ICD-10-CM

## 2013-07-26 DIAGNOSIS — R0989 Other specified symptoms and signs involving the circulatory and respiratory systems: Secondary | ICD-10-CM

## 2013-07-26 DIAGNOSIS — E785 Hyperlipidemia, unspecified: Secondary | ICD-10-CM | POA: Diagnosis present

## 2013-07-26 DIAGNOSIS — D649 Anemia, unspecified: Secondary | ICD-10-CM

## 2013-07-26 DIAGNOSIS — J4489 Other specified chronic obstructive pulmonary disease: Secondary | ICD-10-CM | POA: Diagnosis present

## 2013-07-26 DIAGNOSIS — Z87891 Personal history of nicotine dependence: Secondary | ICD-10-CM

## 2013-07-26 DIAGNOSIS — Z9049 Acquired absence of other specified parts of digestive tract: Secondary | ICD-10-CM

## 2013-07-26 DIAGNOSIS — J441 Chronic obstructive pulmonary disease with (acute) exacerbation: Secondary | ICD-10-CM

## 2013-07-26 HISTORY — DX: Chronic obstructive pulmonary disease, unspecified: J44.9

## 2013-07-26 LAB — BASIC METABOLIC PANEL
BUN: 23 mg/dL (ref 6–23)
CO2: 27 meq/L (ref 19–32)
CREATININE: 1.28 mg/dL (ref 0.50–1.35)
Calcium: 9.6 mg/dL (ref 8.4–10.5)
Chloride: 103 mEq/L (ref 96–112)
GFR calc Af Amer: 55 mL/min — ABNORMAL LOW (ref >90)
GFR, EST NON AFRICAN AMERICAN: 48 mL/min — AB (ref >90)
Glucose, Bld: 110 mg/dL — ABNORMAL HIGH (ref 70–99)
Potassium: 3.9 mEq/L (ref 3.7–5.3)
Sodium: 142 mEq/L (ref 137–147)

## 2013-07-26 LAB — GLUCOSE, CAPILLARY
GLUCOSE-CAPILLARY: 159 mg/dL — AB (ref 70–99)
Glucose-Capillary: 125 mg/dL — ABNORMAL HIGH (ref 70–99)

## 2013-07-26 LAB — URINE MICROSCOPIC-ADD ON

## 2013-07-26 LAB — URINALYSIS, ROUTINE W REFLEX MICROSCOPIC
Bilirubin Urine: NEGATIVE
GLUCOSE, UA: NEGATIVE mg/dL
Ketones, ur: NEGATIVE mg/dL
LEUKOCYTES UA: NEGATIVE
Nitrite: NEGATIVE
PH: 6 (ref 5.0–8.0)
Specific Gravity, Urine: 1.015 (ref 1.005–1.030)
Urobilinogen, UA: 0.2 mg/dL (ref 0.0–1.0)

## 2013-07-26 LAB — CBC WITH DIFFERENTIAL/PLATELET
BASOS ABS: 0 10*3/uL (ref 0.0–0.1)
Basophils Relative: 0 % (ref 0–1)
EOS ABS: 0.1 10*3/uL (ref 0.0–0.7)
Eosinophils Relative: 1 % (ref 0–5)
HCT: 36.2 % — ABNORMAL LOW (ref 39.0–52.0)
Hemoglobin: 11.5 g/dL — ABNORMAL LOW (ref 13.0–17.0)
Lymphocytes Relative: 52 % — ABNORMAL HIGH (ref 12–46)
Lymphs Abs: 1.4 10*3/uL (ref 0.7–4.0)
MCH: 30.9 pg (ref 26.0–34.0)
MCHC: 31.8 g/dL (ref 30.0–36.0)
MCV: 97.3 fL (ref 78.0–100.0)
Monocytes Absolute: 0.3 10*3/uL (ref 0.1–1.0)
Monocytes Relative: 11 % (ref 3–12)
Neutro Abs: 1 10*3/uL — ABNORMAL LOW (ref 1.7–7.7)
Neutrophils Relative %: 36 % — ABNORMAL LOW (ref 43–77)
PLATELETS: 140 10*3/uL — AB (ref 150–400)
RBC: 3.72 MIL/uL — ABNORMAL LOW (ref 4.22–5.81)
RDW: 15.6 % — AB (ref 11.5–15.5)
WBC: 2.7 10*3/uL — ABNORMAL LOW (ref 4.0–10.5)

## 2013-07-26 LAB — TROPONIN I: Troponin I: <0.3 ng/mL (ref <0.30)

## 2013-07-26 LAB — PRO B NATRIURETIC PEPTIDE: Pro B Natriuretic peptide (BNP): 2036 pg/mL — ABNORMAL HIGH (ref 0–450)

## 2013-07-26 LAB — PROTIME-INR
INR: 1.07 (ref 0.00–1.49)
PROTHROMBIN TIME: 13.7 s (ref 11.6–15.2)

## 2013-07-26 MED ORDER — METHYLPREDNISOLONE SODIUM SUCC 125 MG IJ SOLR
125.0000 mg | Freq: Once | INTRAMUSCULAR | Status: AC
  Administered 2013-07-26: 125 mg via INTRAVENOUS
  Filled 2013-07-26: qty 2

## 2013-07-26 MED ORDER — SODIUM CHLORIDE 0.9 % IV SOLN: 250.0000 mL | INTRAVENOUS | Status: DC | PRN

## 2013-07-26 MED ORDER — FUROSEMIDE 10 MG/ML IJ SOLN
40.0000 mg | Freq: Once | INTRAMUSCULAR | Status: AC
  Administered 2013-07-26: 40 mg via INTRAVENOUS
  Filled 2013-07-26: qty 4

## 2013-07-26 MED ORDER — LISINOPRIL 5 MG PO TABS
5.0000 mg | ORAL_TABLET | Freq: Every day | ORAL | Status: DC
  Administered 2013-07-27 – 2013-07-29 (×2): 5 mg via ORAL
  Filled 2013-07-26 (×2): qty 1

## 2013-07-26 MED ORDER — WARFARIN SODIUM 5 MG PO TABS
5.0000 mg | ORAL_TABLET | Freq: Once | ORAL | Status: AC
  Administered 2013-07-26: 5 mg via ORAL
  Filled 2013-07-26: qty 1

## 2013-07-26 MED ORDER — ATORVASTATIN CALCIUM 40 MG PO TABS
80.0000 mg | ORAL_TABLET | Freq: Every day | ORAL | Status: DC
  Administered 2013-07-27 – 2013-07-29 (×3): 80 mg via ORAL
  Filled 2013-07-26 (×3): qty 2

## 2013-07-26 MED ORDER — SODIUM CHLORIDE 0.9 % IJ SOLN
3.0000 mL | Freq: Two times a day (BID) | INTRAMUSCULAR | Status: DC
Start: 2013-07-26 — End: 2013-07-29
  Administered 2013-07-26 – 2013-07-29 (×6): 3 mL via INTRAVENOUS

## 2013-07-26 MED ORDER — TERAZOSIN HCL 1 MG PO CAPS
1.0000 mg | ORAL_CAPSULE | Freq: Every day | ORAL | Status: DC
  Administered 2013-07-26 – 2013-07-28 (×2): 1 mg via ORAL
  Filled 2013-07-26 (×3): qty 1

## 2013-07-26 MED ORDER — FAMOTIDINE 20 MG PO TABS
20.0000 mg | ORAL_TABLET | Freq: Two times a day (BID) | ORAL | Status: DC
  Administered 2013-07-26 – 2013-07-29 (×6): 20 mg via ORAL
  Filled 2013-07-26 (×6): qty 1

## 2013-07-26 MED ORDER — METOPROLOL TARTRATE 25 MG PO TABS
12.5000 mg | ORAL_TABLET | Freq: Two times a day (BID) | ORAL | Status: DC
  Administered 2013-07-26 – 2013-07-29 (×4): 12.5 mg via ORAL
  Filled 2013-07-26 (×5): qty 1

## 2013-07-26 MED ORDER — IPRATROPIUM BROMIDE 0.02 % IN SOLN: 1.0000 mg | Freq: Once | RESPIRATORY_TRACT | Status: DC

## 2013-07-26 MED ORDER — ALBUTEROL SULFATE (2.5 MG/3ML) 0.083% IN NEBU
2.5000 mg | INHALATION_SOLUTION | RESPIRATORY_TRACT | Status: DC | PRN
  Administered 2013-07-27: 2.5 mg via RESPIRATORY_TRACT
  Filled 2013-07-26: qty 3

## 2013-07-26 MED ORDER — ALBUTEROL (5 MG/ML) CONTINUOUS INHALATION SOLN: 10.0000 mg/h | INHALATION_SOLUTION | Freq: Once | RESPIRATORY_TRACT | Status: DC

## 2013-07-26 MED ORDER — ALBUTEROL SULFATE (2.5 MG/3ML) 0.083% IN NEBU
2.5000 mg | INHALATION_SOLUTION | RESPIRATORY_TRACT | Status: DC
  Administered 2013-07-26: 2.5 mg via RESPIRATORY_TRACT
  Filled 2013-07-26: qty 3

## 2013-07-26 MED ORDER — LEVOTHYROXINE SODIUM 50 MCG PO TABS
50.0000 ug | ORAL_TABLET | Freq: Every day | ORAL | Status: DC
  Administered 2013-07-27 – 2013-07-29 (×3): 50 ug via ORAL
  Filled 2013-07-26 (×3): qty 1

## 2013-07-26 MED ORDER — ISOSORBIDE MONONITRATE ER 60 MG PO TB24
30.0000 mg | ORAL_TABLET | Freq: Every day | ORAL | Status: DC
  Administered 2013-07-27 – 2013-07-29 (×2): 30 mg via ORAL
  Filled 2013-07-26 (×2): qty 1

## 2013-07-26 MED ORDER — FUROSEMIDE 10 MG/ML IJ SOLN
40.0000 mg | Freq: Two times a day (BID) | INTRAMUSCULAR | Status: DC
  Administered 2013-07-27 – 2013-07-29 (×6): 40 mg via INTRAVENOUS
  Filled 2013-07-26 (×6): qty 4

## 2013-07-26 MED ORDER — IPRATROPIUM-ALBUTEROL 0.5-2.5 (3) MG/3ML IN SOLN
3.0000 mL | Freq: Once | RESPIRATORY_TRACT | Status: AC
  Administered 2013-07-26: 3 mL via RESPIRATORY_TRACT
  Filled 2013-07-26: qty 3

## 2013-07-26 MED ORDER — ASPIRIN EC 81 MG PO TBEC
81.0000 mg | DELAYED_RELEASE_TABLET | Freq: Every day | ORAL | Status: DC
  Administered 2013-07-27 – 2013-07-29 (×3): 81 mg via ORAL
  Filled 2013-07-26 (×3): qty 1

## 2013-07-26 MED ORDER — ONDANSETRON HCL 4 MG/2ML IJ SOLN
4.0000 mg | Freq: Four times a day (QID) | INTRAMUSCULAR | Status: DC | PRN
Start: 2013-07-26 — End: 2013-07-29

## 2013-07-26 MED ORDER — SODIUM CHLORIDE 0.9 % IJ SOLN: 3.0000 mL | INTRAMUSCULAR | Status: DC | PRN

## 2013-07-26 MED ORDER — WARFARIN - PHARMACIST DOSING INPATIENT
Status: DC
  Administered 2013-07-28 – 2013-07-29 (×2)

## 2013-07-26 MED ORDER — ACETAMINOPHEN 325 MG PO TABS
650.0000 mg | ORAL_TABLET | Freq: Four times a day (QID) | ORAL | Status: DC | PRN
  Administered 2013-07-28 – 2013-07-29 (×3): 650 mg via ORAL
  Filled 2013-07-26 (×3): qty 2

## 2013-07-26 NOTE — H&P (Signed)
[facility] History and Physical  Bryce Mcdonald UXN:[id number] DOB: 06-Oct-1921 DOA: 07/26/2013  Referring physician: Dr. Thurnell Garbe, ER physician PCP: PROVIDER NOT [place]  Chief Complaint: Shortness of breath  HPI: Bryce Mcdonald is a 80 y.o. male who presents to the emergency room with progressive shortness of breath. Patient is a poor historian it is difficult to ascertain the exact details of his illness. He describes having a aggressive shortness of breath the past 2-3 weeks, worse over the past 2-3 days. Has been associated with wheezing. His shortness of breath got significantly worse today which prompted his ER arrival. It is unclear whether he's had any significant chest pain. He does report having a cough productive of thick white sputum. He's noticed worsening lower extremity edema. The patient has a history of pulmonary embolus and DVT as well as atrial fibrillation and is chronically on anticoagulation. He reports compliance with his Coumadin, although INR is unavailable at this time. He was evaluated in the emergency room and noted to have significant lower extremity edema. He was given a dose of Lasix and then had significant urine output. The patient is feeling improved. He did become hypoxic on room air and required supplemental oxygen. He is being admitted for further treatment   Review of Systems:  Pertinent positives as per history of present illness, otherwise negative  Past Medical History  Diagnosis Date  . Colon cancer     Status post partial colectomy  . Hypertension   . Hypercholesterolemia   . Blood transfusion   . Coronary artery disease 2011    Non obstructive CAD  . DVT (deep venous thrombosis)   . PE (pulmonary embolism)   . Diabetes mellitus without complication   . A-fib   . Diabetes mellitus, type II   . Ventral hernia   . Right bundle branch block   . Unspecified hypothyroidism   . LVH (left ventricular hypertrophy) 10/10/2012   Ejection fraction 60-65%.  . Bilateral renal cysts   . Calculus of left kidney     non-obstructing  . COPD (chronic obstructive pulmonary disease)    Past Surgical History  Procedure Laterality Date  . Abdominal surgery      x 3  . Cardiac catheterization  2008   Social History:  reports that he has quit smoking. He does not have any smokeless tobacco history on file. He reports that he does not drink alcohol or use illicit drugs.  Allergies  Allergen Reactions  . Codeine Shortness Of Breath    Shortness of breath    Family history: His mother deceased at 32. Cause of death "my sister scared her to death." He has 16 siblings their collective medical history is positive for CAD, stroke, diabetes, colon cancer    Prior to Admission medications   Medication Sig Start Date End Date Taking? Authorizing Provider  albuterol (PROVENTIL HFA) 108 (90 BASE) MCG/ACT inhaler Inhale 2 puffs into the lungs every 6 (six) hours as needed for wheezing or shortness of breath.   Yes Historical Provider, MD  atorvastatin (LIPITOR) 80 MG tablet Take 80 mg by mouth daily.   Yes Historical Provider, MD  famotidine (PEPCID) 20 MG tablet Take 1 tablet (20 mg total) by mouth 2 (two) times daily. 12/06/12  Yes Lezlie Octave Black, NP  furosemide (LASIX) 20 MG tablet Take 20 mg by mouth every morning.   Yes Historical Provider, MD  isosorbide mononitrate (IMDUR) 30 MG 24 hr tablet Take 1 tablet (30 mg total)  by mouth daily. Long acting nitroglycerin medicine for your heart. 10/11/12  Yes Rexene Alberts, MD  levothyroxine (SYNTHROID, LEVOTHROID) 50 MCG tablet Take 50 mcg by mouth daily before breakfast.   Yes Historical Provider, MD  lisinopril (PRINIVIL,ZESTRIL) 10 MG tablet Take 5 mg by mouth daily.   Yes Historical Provider, MD  metoprolol tartrate (LOPRESSOR) 25 MG tablet Take 12.5 mg by mouth 2 (two) times daily.   Yes Historical Provider, MD  terazosin (HYTRIN) 1 MG capsule Take 1 mg by mouth at bedtime.   Yes  Historical Provider, MD   Physical Exam: Filed Vitals:   07/26/13 1545  BP:   Pulse:   Temp: 97.4 F (36.3 C)  Resp:     BP 157/58  Pulse 64  Temp(Src) 97.4 F (36.3 C) (Oral)  Resp 20  Ht 5\' 8"  (1.727 m)  Wt 82.2 kg (181 lb 3.5 oz)  BMI 27.56 kg/m2  SpO2 93%  General:  Appears calm and comfortable Eyes: PERRL, normal lids, irises & conjunctiva ENT: grossly normal hearing, lips & tongue Neck: no LAD, masses or thyromegaly Cardiovascular: S1, S2, irregular, 1+ pedal edema Telemetry: Atrial fibrillation  Respiratory: Crackles at bases, increased respiratory effort Abdomen: soft, ntnd Skin: no rash or induration seen on limited exam Musculoskeletal: grossly normal tone BUE/BLE Psychiatric: grossly normal mood and affect, speech fluent and appropriate Neurologic: grossly non-focal.          Labs on Admission:  Basic Metabolic Panel:  Recent Labs Lab 07/26/13 1132  NA 142  K 3.9  CL 103  CO2 27  GLUCOSE 110*  BUN 23  CREATININE 1.28  CALCIUM 9.6   Liver Function Tests: No results found for this basename: AST, ALT, ALKPHOS, BILITOT, PROT, ALBUMIN,  in the last 168 hours No results found for this basename: LIPASE, AMYLASE,  in the last 168 hours No results found for this basename: AMMONIA,  in the last 168 hours CBC:  Recent Labs Lab 07/26/13 1132  WBC 2.7*  NEUTROABS 1.0*  HGB 11.5*  HCT 36.2*  MCV 97.3  PLT 140*   Cardiac Enzymes:  Recent Labs Lab 07/26/13 1132  TROPONINI <0.30    BNP (last 3 results)  Recent Labs  11/10/12 2136 06/26/13 0401 07/26/13 1128  PROBNP 1765.0* 1461.0* 2036.0*   CBG:  Recent Labs Lab 07/26/13 1709  GLUCAP 125*    Radiological Exams on Admission: Dg Chest Portable 1 View  07/26/2013   CLINICAL DATA:  Shortness of breath and weakness.  EXAM: PORTABLE CHEST - 1 VIEW  COMPARISON:  07/09/2013, 06/26/2013 and 12/29/2012  FINDINGS: Lungs are adequately inflated with chronic bibasilar scarring. There is no  focal consolidation or effusion. There is mild stable cardiomegaly. There is minimal calcified plaque over the aortic arch. Remainder the exam is unchanged.  IMPRESSION: No acute cardiopulmonary disease.  Chronic stable bibasilar scarring.  Mild stable cardiomegaly.   Electronically Signed   By: Marin Olp M.D.   On: 07/26/2013 11:59    EKG: is not available for my review, but per ER physician no, EKG shows atrial fibrillation with right bundle-branch block and no significant change from prior EKG  Assessment/Plan Active Problems:   HTN (hypertension)   Hyperlipidemia   Atrial fibrillation with slow ventricular response   Chronic anticoagulation   Leukopenia   Type 2 diabetes mellitus   Dyspnea   Acute respiratory failure   Acute on chronic diastolic CHF (congestive heart failure)   1. Acute on chronic diastolic congestive heart failure.  BNP is above his baseline he has clinical signs of congestive heart failure. He'll be started on intravenous Lasix. He is already on ACE inhibitor and beta blocker. Last echocardiogram was done a year ago, this will be updated. We'll monitor strict intake and output. 2. Acute respiratory failure due to #1. Wean off oxygen as tolerated. 3. Hypertension. Continue outpatient regimen 4. Atrial fibrillation. Rate controlled. Continue on Coumadin. 5. History of DVT/PE on chronic anticoagulation. INR is currently pending. Continue Coumadin. 6. Hyperlipidemia. Continue statin. 7. Leukopenia, appears to be a chronic issue, to be followed as an outpatient.   Code Status: full code Family Communication: discussed with patient Disposition Plan: discharge home once improved  Time spent: 23mins  [facility] Pager [phone]  **Disclaimer: This note may have been dictated with voice recognition software. Similar sounding words can inadvertently be transcribed and this note may contain transcription errors which may not have been corrected  upon publication of note.**

## 2013-07-26 NOTE — ED Provider Notes (Signed)
CSN: [id number]     Arrival date & time 07/26/13  1054 History   First MD Initiated Contact with Patient 07/26/13 1211     Chief Complaint  Patient presents with  . Weakness     HPI Pt was seen at 1215.  Per pt and his family, c/o gradual onset and worsening of persistent generalized weakness for the past 3 to 4 weeks, worse over the past 3 days. Has been associated with SOB, wheezing, and increasing pedal edema for the past 3 days. Symptoms worsen when he lays flat and ambulates. Pt's family states pt was "so weak and SOB" he "kept having to sit down and rest." Pt's family "thinks he might have passed out" briefly while he was sitting in a chair today. Denies CP/palpitations, no back pain, no abd pain, no N/V/D, no fevers, no rash.     Past Medical History  Diagnosis Date  . Colon cancer     Status post partial colectomy  . Hypertension   . Hypercholesterolemia   . Blood transfusion   . Coronary artery disease 2011    Non obstructive CAD  . DVT (deep venous thrombosis)   . PE (pulmonary embolism)   . Diabetes mellitus without complication   . A-fib   . Diabetes mellitus, type II   . Ventral hernia   . Right bundle branch block   . Unspecified hypothyroidism   . LVH (left ventricular hypertrophy) 10/10/2012    Ejection fraction 60-65%.  . Bilateral renal cysts   . Calculus of left kidney     non-obstructing  . COPD (chronic obstructive pulmonary disease)    Past Surgical History  Procedure Laterality Date  . Abdominal surgery      x 3  . Cardiac catheterization  2008    History  Substance Use Topics  . Smoking status: Former Research scientist (life sciences)  . Smokeless tobacco: Not on file     Comment: quit over 40 years ago  . Alcohol Use: No     Comment: quit over forty years ago    Review of Systems ROS: Statement: All systems negative except as marked or noted in the HPI; Constitutional: Negative for fever and chills. +generalized weakness/fatigue; ; Eyes: Negative for eye pain,  redness and discharge. ; ; ENMT: Negative for ear pain, hoarseness, nasal congestion, sinus pressure and sore throat. ; ; Cardiovascular: Negative for chest pain, palpitations, diaphoresis, +dyspnea and peripheral edema. ; ; Respiratory: +wheezing. Negative for cough and stridor. ; ; Gastrointestinal: Negative for nausea, vomiting, diarrhea, abdominal pain, blood in stool, hematemesis, jaundice and rectal bleeding. . ; ; Genitourinary: Negative for dysuria, flank pain and hematuria. ; ; Musculoskeletal: Negative for back pain and neck pain. Negative for swelling and trauma.; ; Skin: Negative for pruritus, rash, abrasions, blisters, bruising and skin lesion.; ; Neuro: Negative for headache, lightheadedness and neck stiffness. +possible syncope. Negative for extremity weakness, paresthesias, involuntary movement, seizure.      Allergies  Codeine  Home Medications   Prior to Admission medications   Medication Sig Start Date End Date Taking? Authorizing Provider  albuterol (PROVENTIL HFA) 108 (90 BASE) MCG/ACT inhaler Inhale 2 puffs into the lungs every 6 (six) hours as needed for wheezing or shortness of breath.   Yes Historical Provider, MD  atorvastatin (LIPITOR) 80 MG tablet Take 80 mg by mouth daily.   Yes Historical Provider, MD  famotidine (PEPCID) 20 MG tablet Take 1 tablet (20 mg total) by mouth 2 (two) times daily. 12/06/12  Yes  Radene Gunning, NP  furosemide (LASIX) 20 MG tablet Take 20 mg by mouth every morning.   Yes Historical Provider, MD  isosorbide mononitrate (IMDUR) 30 MG 24 hr tablet Take 1 tablet (30 mg total) by mouth daily. Long acting nitroglycerin medicine for your heart. 10/11/12  Yes Rexene Alberts, MD  levothyroxine (SYNTHROID, LEVOTHROID) 50 MCG tablet Take 50 mcg by mouth daily before breakfast.   Yes Historical Provider, MD  lisinopril (PRINIVIL,ZESTRIL) 10 MG tablet Take 5 mg by mouth daily.   Yes Historical Provider, MD  metoprolol tartrate (LOPRESSOR) 25 MG tablet Take  12.5 mg by mouth 2 (two) times daily.   Yes Historical Provider, MD  terazosin (HYTRIN) 1 MG capsule Take 1 mg by mouth at bedtime.   Yes Historical Provider, MD   BP 135/77  Pulse 65  Temp(Src) 97.7 F (36.5 C) (Oral)  Resp 20  SpO2 95% Physical Exam 1220: Physical examination:  Nursing notes reviewed; Vital signs and O2 SAT reviewed;  Constitutional: Well developed, Well nourished, In no acute distress; Head:  Normocephalic, atraumatic; Eyes: EOMI, PERRL, No scleral icterus; ENMT: Mouth and pharynx normal, Mucous membranes dry; Neck: Supple, Full range of motion, No lymphadenopathy; Cardiovascular: Irregular irregular rate and rhythm, No gallop; Respiratory: Breath sounds diminished & equal bilaterally, faint scattered wheezes with occasional audible wheezing. Speaking full sentences, no retrax or access mm use. Normal respiratory effort/excursion; Chest: Nontender, Movement normal; Abdomen: Soft, Nontender, Nondistended, Normal bowel sounds; Genitourinary: No CVA tenderness; Extremities: Pulses normal, No tenderness, +2 pedal edema bilat without calf asymmetry.; Neuro: AA&Ox3, vague/rambling historian. +HOH, otherwise major CN grossly intact.  Speech clear. Moves all extremities on stretcher spontaneously and to command without apparent gross focal motor deficits.; Skin: Color normal, Warm, Dry.   ED Course  Procedures     EKG Interpretation None      MDM  MDM Reviewed: previous chart, nursing note and vitals Reviewed previous: labs and ECG Interpretation: labs, ECG and x-ray    Date: 07/26/2013  Rate: 62  Rhythm: atrial fibrillation  QRS Axis: left  Intervals: normal  ST/T Wave abnormalities: normal  Conduction Disutrbances:right bundle branch block  Narrative Interpretation:   Old EKG Reviewed: unchanged; no significant changes from previous EKG dated 06/26/2013.  Results for orders placed during the hospital encounter of 07/26/13  CBC WITH DIFFERENTIAL      Result Value  Ref Range   WBC 2.7 (*) 4.0 - 10.5 K/uL   RBC 3.72 (*) 4.22 - 5.81 MIL/uL   Hemoglobin 11.5 (*) 13.0 - 17.0 g/dL   HCT 36.2 (*) 39.0 - 52.0 %   MCV 97.3  78.0 - 100.0 fL   MCH 30.9  26.0 - 34.0 pg   MCHC 31.8  30.0 - 36.0 g/dL   RDW 15.6 (*) 11.5 - 15.5 %   Platelets 140 (*) 150 - 400 K/uL   Neutrophils Relative % 36 (*) 43 - 77 %   Neutro Abs 1.0 (*) 1.7 - 7.7 K/uL   Lymphocytes Relative 52 (*) 12 - 46 %   Lymphs Abs 1.4  0.7 - 4.0 K/uL   Monocytes Relative 11  3 - 12 %   Monocytes Absolute 0.3  0.1 - 1.0 K/uL   Eosinophils Relative 1  0 - 5 %   Eosinophils Absolute 0.1  0.0 - 0.7 K/uL   Basophils Relative 0  0 - 1 %   Basophils Absolute 0.0  0.0 - 0.1 K/uL   RBC Morphology POLYCHROMASIA PRESENT  BASIC METABOLIC PANEL      Result Value Ref Range   Sodium 142  137 - 147 mEq/L   Potassium 3.9  3.7 - 5.3 mEq/L   Chloride 103  96 - 112 mEq/L   CO2 27  19 - 32 mEq/L   Glucose, Bld 110 (*) 70 - 99 mg/dL   BUN 23  6 - 23 mg/dL   Creatinine, Ser 1.28  0.50 - 1.35 mg/dL   Calcium 9.6  8.4 - 10.5 mg/dL   GFR calc non Af Amer 48 (*) >90 mL/min   GFR calc Af Amer 55 (*) >90 mL/min  PRO B NATRIURETIC PEPTIDE      Result Value Ref Range   Pro B Natriuretic peptide (BNP) 2036.0 (*) 0 - 450 pg/mL  TROPONIN I      Result Value Ref Range   Troponin I <0.30  <0.30 ng/mL  URINALYSIS, ROUTINE W REFLEX MICROSCOPIC      Result Value Ref Range   Color, Urine YELLOW  YELLOW   APPearance CLEAR  CLEAR   Specific Gravity, Urine 1.015  1.005 - 1.030   pH 6.0  5.0 - 8.0   Glucose, UA NEGATIVE  NEGATIVE mg/dL   Hgb urine dipstick TRACE (*) NEGATIVE   Bilirubin Urine NEGATIVE  NEGATIVE   Ketones, ur NEGATIVE  NEGATIVE mg/dL   Protein, ur TRACE (*) NEGATIVE mg/dL   Urobilinogen, UA 0.2  0.0 - 1.0 mg/dL   Nitrite NEGATIVE  NEGATIVE   Leukocytes, UA NEGATIVE  NEGATIVE  URINE MICROSCOPIC-ADD ON      Result Value Ref Range   RBC / HPF 0-2  <3 RBC/hpf   Dg Chest Portable 1 View 07/26/2013    CLINICAL DATA:  Shortness of breath and weakness.  EXAM: PORTABLE CHEST - 1 VIEW  COMPARISON:  07/09/2013, 06/26/2013 and 12/29/2012  FINDINGS: Lungs are adequately inflated with chronic bibasilar scarring. There is no focal consolidation or effusion. There is mild stable cardiomegaly. There is minimal calcified plaque over the aortic arch. Remainder the exam is unchanged.  IMPRESSION: No acute cardiopulmonary disease.  Chronic stable bibasilar scarring.  Mild stable cardiomegaly.   Electronically Signed   By: Marin Olp M.D.   On: 07/26/2013 11:59    1445:  Pt's O2 Sats high 80's on R/A on his arrival to ED. Short neb and IV solumedrol given. Pt stated he "felt better." Sats improved to 97-99% on O2 2L N/C, lungs coarse bilat without further wheezing. BNP elevated compared to previous; IV lasix given. Leukopenia and thrombocytopenia per hx of same. H/H per baseline. Dx and testing d/w pt and family.  Questions answered.  Verb understanding, agreeable to admit. T/C to [facility]. Roderic Palau, case discussed, including:  HPI, pertinent PM/SHx, VS/PE, dx testing, ED course and treatment:  Agreeable to admit, requests to write temporary orders, obtain tele bed to team 2.       Alfonzo Feller, DO 07/27/13 [phone]

## 2013-07-26 NOTE — ED Notes (Signed)
Pt states that he is feeling better after neb tx.

## 2013-07-26 NOTE — ED Notes (Signed)
Pt states he bent over and had urinary incontinence and "has not felt right since". Pt c/o nausea and weakness all over body. Denies pain or discomfort in chest at this time. Alert/oriented. cbg 117 in route. Pt grunting with expiration, states that is new today but denies sob.

## 2013-07-26 NOTE — Progress Notes (Signed)
ANTICOAGULATION CONSULT NOTE - Initial Consult  Pharmacy Consult for Warfarin Indication: atrial fibrillation plus Hx DVT/PE  Allergies  Allergen Reactions  . Codeine Shortness Of Breath    Shortness of breath    Patient Measurements: Height: 5\' 8"  (172.7 cm) Weight: 181 lb 3.5 oz (82.2 kg) IBW/kg (Calculated) : 68.4   Vital Signs: Temp: 97.4 F (36.3 C) (06/06 1545) Temp src: Oral (06/06 1540) BP: 157/58 mmHg (06/06 1544) Pulse Rate: 64 (06/06 1540)  Labs:  Recent Labs  07/26/13 1128 07/26/13 1132  HGB  --  11.5*  HCT  --  36.2*  PLT  --  140*  LABPROT 13.7  --   INR 1.07  --   CREATININE  --  1.28  TROPONINI  --  <0.30    Estimated Creatinine Clearance: 40.9 ml/min (by C-G formula based on Cr of 1.28).   Medical History: Past Medical History  Diagnosis Date  . Colon cancer     Status post partial colectomy  . Hypertension   . Hypercholesterolemia   . Blood transfusion   . Coronary artery disease 2011    Non obstructive CAD  . DVT (deep venous thrombosis)   . PE (pulmonary embolism)   . Diabetes mellitus without complication   . A-fib   . Diabetes mellitus, type II   . Ventral hernia   . Right bundle branch block   . Unspecified hypothyroidism   . LVH (left ventricular hypertrophy) 10/10/2012    Ejection fraction 60-65%.  . Bilateral renal cysts   . Calculus of left kidney     non-obstructing  . COPD (chronic obstructive pulmonary disease)     Medications:  Prescriptions prior to admission  Medication Sig Dispense Refill  . albuterol (PROVENTIL HFA) 108 (90 BASE) MCG/ACT inhaler Inhale 2 puffs into the lungs every 6 (six) hours as needed for wheezing or shortness of breath.      Marland Kitchen atorvastatin (LIPITOR) 80 MG tablet Take 80 mg by mouth daily.      . famotidine (PEPCID) 20 MG tablet Take 1 tablet (20 mg total) by mouth 2 (two) times daily.      . furosemide (LASIX) 20 MG tablet Take 20 mg by mouth every morning.      . isosorbide mononitrate  (IMDUR) 30 MG 24 hr tablet Take 1 tablet (30 mg total) by mouth daily. Long acting nitroglycerin medicine for your heart.  30 tablet  6  . levothyroxine (SYNTHROID, LEVOTHROID) 50 MCG tablet Take 50 mcg by mouth daily before breakfast.      . lisinopril (PRINIVIL,ZESTRIL) 10 MG tablet Take 5 mg by mouth daily.      . metoprolol tartrate (LOPRESSOR) 25 MG tablet Take 12.5 mg by mouth 2 (two) times daily.      Marland Kitchen terazosin (HYTRIN) 1 MG capsule Take 1 mg by mouth at bedtime.        Assessment: 80 yo male, with Afib and Hx DVT/PE.  Apparently on warfarin at home but INR is at baseline.  Patient told med history tech that his home dose is 100mg (?) and he took his dose this AM.  Will attempt to get records from [place] and/or speak with aide that helps with his medicine.  Goal of Therapy:  INR 2-3    Plan:  Will give 5mg  PO Warfarin tonight. Daily PT/INR Attempt to get further information regarding his anticoagulation therapy in AM.   Pricilla Larsson 07/26/2013,7:38 PM

## 2013-07-27 DIAGNOSIS — D649 Anemia, unspecified: Secondary | ICD-10-CM

## 2013-07-27 DIAGNOSIS — I4891 Unspecified atrial fibrillation: Secondary | ICD-10-CM

## 2013-07-27 LAB — CBC
HCT: 34.4 % — ABNORMAL LOW (ref 39.0–52.0)
HEMOGLOBIN: 11.1 g/dL — AB (ref 13.0–17.0)
MCH: 30.8 pg (ref 26.0–34.0)
MCHC: 32.3 g/dL (ref 30.0–36.0)
MCV: 95.6 fL (ref 78.0–100.0)
Platelets: 146 10*3/uL — ABNORMAL LOW (ref 150–400)
RBC: 3.6 MIL/uL — ABNORMAL LOW (ref 4.22–5.81)
RDW: 15.3 % (ref 11.5–15.5)
WBC: 2.3 10*3/uL — AB (ref 4.0–10.5)

## 2013-07-27 LAB — BASIC METABOLIC PANEL
BUN: 21 mg/dL (ref 6–23)
CO2: 28 mEq/L (ref 19–32)
Calcium: 9.8 mg/dL (ref 8.4–10.5)
Chloride: 101 mEq/L (ref 96–112)
Creatinine, Ser: 1.05 mg/dL (ref 0.50–1.35)
GFR calc Af Amer: 70 mL/min — ABNORMAL LOW (ref >90)
GFR calc non Af Amer: 61 mL/min — ABNORMAL LOW (ref >90)
GLUCOSE: 124 mg/dL — AB (ref 70–99)
POTASSIUM: 3.9 meq/L (ref 3.7–5.3)
Sodium: 141 mEq/L (ref 137–147)

## 2013-07-27 LAB — TROPONIN I
Troponin I: <0.3 ng/mL (ref <0.30)
Troponin I: <0.3 ng/mL (ref <0.30)

## 2013-07-27 LAB — TSH: TSH: 4.41 u[IU]/mL (ref 0.350–4.500)

## 2013-07-27 LAB — GLUCOSE, CAPILLARY: GLUCOSE-CAPILLARY: 135 mg/dL — AB (ref 70–99)

## 2013-07-27 MED ORDER — IPRATROPIUM-ALBUTEROL 0.5-2.5 (3) MG/3ML IN SOLN: 3.0000 mL | RESPIRATORY_TRACT | Status: DC

## 2013-07-27 MED ORDER — IPRATROPIUM-ALBUTEROL 0.5-2.5 (3) MG/3ML IN SOLN
3.0000 mL | Freq: Four times a day (QID) | RESPIRATORY_TRACT | Status: DC
  Administered 2013-07-27 – 2013-07-29 (×9): 3 mL via RESPIRATORY_TRACT
  Filled 2013-07-27 (×9): qty 3

## 2013-07-27 MED ORDER — WARFARIN SODIUM 7.5 MG PO TABS
7.5000 mg | ORAL_TABLET | Freq: Once | ORAL | Status: AC
  Administered 2013-07-27: 7.5 mg via ORAL
  Filled 2013-07-27: qty 1

## 2013-07-27 MED ORDER — ENOXAPARIN SODIUM 80 MG/0.8ML ~~LOC~~ SOLN
1.0000 mg/kg | Freq: Two times a day (BID) | SUBCUTANEOUS | Status: DC
  Administered 2013-07-27 – 2013-07-29 (×5): 80 mg via SUBCUTANEOUS
  Filled 2013-07-27 (×5): qty 0.8

## 2013-07-27 NOTE — Progress Notes (Signed)
Pt declines getting OOB to chair at this time, states he wants to lay back down to rest.  Will try again this afternoon.

## 2013-07-27 NOTE — Progress Notes (Signed)
Pt in bed. eyes closed. O2 via [place] 2L. No wheezing noted at this time. Pt appears comfortable. Will continue to monitor.

## 2013-07-27 NOTE — Progress Notes (Signed)
Patient ambulated halls with RW and supervision.  Tolerated well with slight SOB, unable to get accurate pulse ox d/t machine, but patient 94% upon returning room and sitting up in chair.  Will attempt to get O2 stat with ambulation this evening.

## 2013-07-27 NOTE — Progress Notes (Signed)
Pt has been bradycardic to night at times. Noted wheezing when rounding on pt. Respiratory called and pt given albuterol treatment. Will continue to monitor.

## 2013-07-27 NOTE — Progress Notes (Signed)
  Echocardiogram 2D Echocardiogram has been performed.  Bryce Mcdonald 07/27/2013, 9:22 AM

## 2013-07-27 NOTE — Progress Notes (Signed)
[place] for Warfarin / Lovenox Indication: atrial fibrillation plus Hx DVT/PE  Allergies  Allergen Reactions  . Codeine Shortness Of Breath    Shortness of breath    Patient Measurements: Height: 5\' 8"  (172.7 cm) Weight: 179 lb 10.8 oz (81.5 kg) IBW/kg (Calculated) : 68.4   Vital Signs: Temp: 97.8 F (36.6 C) (06/07 0704) Temp src: Oral (06/07 0704) BP: 135/65 mmHg (06/07 0704) Pulse Rate: 77 (06/07 0900)  Labs:  Recent Labs  07/26/13 1128  07/26/13 1132 07/27/13 0023 07/27/13 0653 07/27/13 1241  HGB  --   --  11.5*  --  11.1*  --   HCT  --   --  36.2*  --  34.4*  --   PLT  --   --  140*  --  146*  --   LABPROT 13.7  --   --   --   --   --   INR 1.07  --   --   --   --   --   CREATININE  --   --  1.28  --  1.05  --   TROPONINI  --   < > <0.30 <0.30 <0.30 <0.30  < > = values in this interval not displayed.  Estimated Creatinine Clearance: 46.1 ml/min (by C-G formula based on Cr of 1.05).   Medical History: Past Medical History  Diagnosis Date  . Colon cancer     Status post partial colectomy  . Hypertension   . Hypercholesterolemia   . Blood transfusion   . Coronary artery disease 2011    Non obstructive CAD  . DVT (deep venous thrombosis)   . PE (pulmonary embolism)   . Diabetes mellitus without complication   . A-fib   . Diabetes mellitus, type II   . Ventral hernia   . Right bundle branch block   . Unspecified hypothyroidism   . LVH (left ventricular hypertrophy) 10/10/2012    Ejection fraction 60-65%.  . Bilateral renal cysts   . Calculus of left kidney     non-obstructing  . COPD (chronic obstructive pulmonary disease)     Medications:  Prescriptions prior to admission  Medication Sig Dispense Refill  . albuterol (PROVENTIL HFA) 108 (90 BASE) MCG/ACT inhaler Inhale 2 puffs into the lungs every 6 (six) hours as needed for wheezing or shortness of breath.      Marland Kitchen atorvastatin (LIPITOR) 80 MG tablet Take  80 mg by mouth daily.      . famotidine (PEPCID) 20 MG tablet Take 1 tablet (20 mg total) by mouth 2 (two) times daily.      . furosemide (LASIX) 20 MG tablet Take 20 mg by mouth every morning.      . isosorbide mononitrate (IMDUR) 30 MG 24 hr tablet Take 1 tablet (30 mg total) by mouth daily. Long acting nitroglycerin medicine for your heart.  30 tablet  6  . levothyroxine (SYNTHROID, LEVOTHROID) 50 MCG tablet Take 50 mcg by mouth daily before breakfast.      . lisinopril (PRINIVIL,ZESTRIL) 10 MG tablet Take 5 mg by mouth daily.      . metoprolol tartrate (LOPRESSOR) 25 MG tablet Take 12.5 mg by mouth 2 (two) times daily.      Marland Kitchen terazosin (HYTRIN) 1 MG capsule Take 1 mg by mouth at bedtime.        Assessment: 80 yo male, with Afib and Hx DVT/PE.  Apparently on warfarin at home but INR is at  baseline.  Per MD Note: "From what the patient describes, it appears that he may have been on coumadin in the past, but then was switched to lovenox injections when he had a VTE on coumadin. Will start the patient on lovenox bridge. Need to get records from [place] to verify anticoagulation regimen."  INR remain below goal and will add Lovenox bridge therapy per MD request.  Goal of Therapy:  INR 2-3    Plan:  7.5mg  PO Warfarin today. Daily PT/INR Lovenox 1mg /kg SQ BID. CBC daily for PLTC monitoring.  Pricilla Larsson 07/27/2013,1:29 PM

## 2013-07-27 NOTE — Progress Notes (Signed)
[facility] PROGRESS NOTE  Bryce Mcdonald AST:[id number] DOB: 03/15/1921 DOA: 07/26/2013 PCP: PROVIDER NOT IN SYSTEM  Assessment/Plan: Acute on chronic diastolic congestive heart failure. BNP is above his baseline and he has clinical signs of congestive heart failure. He has been started on intravenous lasix and has had good urine output. He is already on ACE inhibitor and beta blocker. Last echocardiogram was done a year ago, this will be updated. We'll monitor strict intake and output. Continue current treatments. He still appears to be volume overloaded  Acute respiratory failure due to #1. Wean off oxygen as tolerated. Patient will be ambulated today  Hypertension. Continue outpatient regimen   Atrial fibrillation. Rate controlled. Continue on Coumadin.   History of DVT/PE on chronic anticoagulation. INR is normal.  It is unclear if he has been prescribed coumadin.  From what the patient describes, it appears that he may have been on coumadin in the past, but then was switched to lovenox injections when he had a VTE on coumadin.  Will start the patient on lovenox bridge.  Need to get records from [place] to verify anticoagulation regimen.  Hyperlipidemia. Continue statin.   Leukopenia, appears to be a chronic issue, to be followed as an outpatient.   Code Status: full code Family Communication: no family present Disposition Plan: discharge home once improved   Consultants:    Procedures:    Antibiotics:    HPI/Subjective: Complaining of pain in left upper back, feels that it may be related to lying in bed.  Breathing is better.  Objective: Filed Vitals:   07/27/13 0900  BP:   Pulse: 77  Temp:   Resp:     Intake/Output Summary (Last 24 hours) at 07/27/13 1307 Last data filed at 07/27/13 1200  Gross per 24 hour  Intake    240 ml  Output   2950 ml  Net  -2710 ml   Filed Weights   07/26/13 1540 07/27/13 0432  Weight: 82.2 kg (181 lb 3.5 oz) 81.5 kg (179 lb  10.8 oz)    Exam:   General:  NAD  Cardiovascular: s1, s2, irregular  Respiratory: cta b  Abdomen: soft, nt, nd, bs+  Musculoskeletal: 1+ edema b/l   Data Reviewed: Basic Metabolic Panel:  Recent Labs Lab 07/26/13 1132 07/27/13 0653  NA 142 141  K 3.9 3.9  CL 103 101  CO2 27 28  GLUCOSE 110* 124*  BUN 23 21  CREATININE 1.28 1.05  CALCIUM 9.6 9.8   Liver Function Tests: No results found for this basename: AST, ALT, ALKPHOS, BILITOT, PROT, ALBUMIN,  in the last 168 hours No results found for this basename: LIPASE, AMYLASE,  in the last 168 hours No results found for this basename: AMMONIA,  in the last 168 hours CBC:  Recent Labs Lab 07/26/13 1132 07/27/13 0653  WBC 2.7* 2.3*  NEUTROABS 1.0*  --   HGB 11.5* 11.1*  HCT 36.2* 34.4*  MCV 97.3 95.6  PLT 140* 146*   Cardiac Enzymes:  Recent Labs Lab 07/26/13 1132 07/27/13 0023 07/27/13 0653  TROPONINI <0.30 <0.30 <0.30   BNP (last 3 results)  Recent Labs  11/10/12 2136 06/26/13 0401 07/26/13 1128  PROBNP 1765.0* 1461.0* 2036.0*   CBG:  Recent Labs Lab 07/26/13 1709 07/26/13 2210 07/27/13 0743  GLUCAP 125* 159* 135*    No results found for this or any previous visit (from the past 240 hour(s)).   Studies: Dg Chest Portable 1 View  07/26/2013   CLINICAL DATA:  Shortness of breath and weakness.  EXAM: PORTABLE CHEST - 1 VIEW  COMPARISON:  07/09/2013, 06/26/2013 and 12/29/2012  FINDINGS: Lungs are adequately inflated with chronic bibasilar scarring. There is no focal consolidation or effusion. There is mild stable cardiomegaly. There is minimal calcified plaque over the aortic arch. Remainder the exam is unchanged.  IMPRESSION: No acute cardiopulmonary disease.  Chronic stable bibasilar scarring.  Mild stable cardiomegaly.   Electronically Signed   By: Marin Olp M.D.   On: 07/26/2013 11:59    Scheduled Meds: . aspirin EC  81 mg Oral Daily  . atorvastatin  80 mg Oral Daily  . famotidine   20 mg Oral BID  . furosemide  40 mg Intravenous BID  . ipratropium-albuterol  3 mL Nebulization QID  . isosorbide mononitrate  30 mg Oral Daily  . levothyroxine  50 mcg Oral QAC breakfast  . lisinopril  5 mg Oral Daily  . metoprolol tartrate  12.5 mg Oral BID  . sodium chloride  3 mL Intravenous Q12H  . terazosin  1 mg Oral QHS  . Warfarin - Pharmacist Dosing Inpatient   Does not apply Q24H   Continuous Infusions:   Active Problems:   HTN (hypertension)   Hyperlipidemia   Atrial fibrillation with slow ventricular response   Chronic anticoagulation   Leukopenia   Dyspnea   Acute respiratory failure   Acute on chronic diastolic CHF (congestive heart failure)    Time spent: 25mins    Dorena Dorfman  [facility] Pager [phone]. If 7PM-7AM, please contact night-coverage at [email or web address], password [facility] 07/27/2013, 1:07 PM  LOS: 1 day

## 2013-07-27 NOTE — Progress Notes (Signed)
Utilization review Completed Swanson Farnell RN BSN   

## 2013-07-28 LAB — PROTIME-INR
INR: 1.2 (ref 0.00–1.49)
Prothrombin Time: 14.9 seconds (ref 11.6–15.2)

## 2013-07-28 LAB — URINE CULTURE: Colony Count: 35000

## 2013-07-28 LAB — CBC
HCT: 33.3 % — ABNORMAL LOW (ref 39.0–52.0)
Hemoglobin: 10.7 g/dL — ABNORMAL LOW (ref 13.0–17.0)
MCH: 30.9 pg (ref 26.0–34.0)
MCHC: 32.1 g/dL (ref 30.0–36.0)
MCV: 96.2 fL (ref 78.0–100.0)
Platelets: 138 10*3/uL — ABNORMAL LOW (ref 150–400)
RBC: 3.46 MIL/uL — ABNORMAL LOW (ref 4.22–5.81)
RDW: 15.7 % — ABNORMAL HIGH (ref 11.5–15.5)
WBC: 3.1 10*3/uL — AB (ref 4.0–10.5)

## 2013-07-28 LAB — BASIC METABOLIC PANEL
BUN: 28 mg/dL — ABNORMAL HIGH (ref 6–23)
CO2: 30 mEq/L (ref 19–32)
CREATININE: 1.19 mg/dL (ref 0.50–1.35)
Calcium: 9.4 mg/dL (ref 8.4–10.5)
Chloride: 100 mEq/L (ref 96–112)
GFR, EST AFRICAN AMERICAN: 61 mL/min — AB (ref >90)
GFR, EST NON AFRICAN AMERICAN: 52 mL/min — AB (ref >90)
Glucose, Bld: 107 mg/dL — ABNORMAL HIGH (ref 70–99)
Potassium: 3.7 mEq/L (ref 3.7–5.3)
Sodium: 143 mEq/L (ref 137–147)

## 2013-07-28 LAB — GLUCOSE, CAPILLARY: GLUCOSE-CAPILLARY: 100 mg/dL — AB (ref 70–99)

## 2013-07-28 MED ORDER — WARFARIN SODIUM 5 MG PO TABS
5.0000 mg | ORAL_TABLET | Freq: Once | ORAL | Status: AC
  Administered 2013-07-28: 5 mg via ORAL
  Filled 2013-07-28: qty 1

## 2013-07-28 NOTE — Progress Notes (Signed)
Patient O2 Sat 92% on room air resting. Ambulating in hallway dropped to 85% on room air. O2 applied at 2 liters by [place]. O2 sat 94% on 2 liters ambulating in hallway.

## 2013-07-28 NOTE — Care Management Note (Signed)
    Page 1 of 1   07/29/2013     3:30:03 PM CARE MANAGEMENT NOTE 07/29/2013  Patient:  Bryce Mcdonald, Bryce Mcdonald   Account Number:  [id number]  Date Initiated:  07/28/2013  Documentation initiated by:  Claretha Cooper  Subjective/Objective Assessment:   Pt is a veteran with VA benefits. Election to transfer signed, witnessed and faxed to [facility]. Per Margarita Grizzle, they are on bed deversion and will call CM when that is lifted     Action/Plan:   Pt appears to need O2 when DC'd. Documentation needs to be sent to [facility], Home O2 facilitator.   Anticipated DC Date:  07/29/2013   Anticipated DC Plan:  [place]  CM consult      Choice offered to / List presented to:        DME agency  [facility]        Status of service:  Completed, signed off Medicare Important Message given?   (If response is "NO", the following Medicare IM given date fields will be blank) Date Medicare IM given:   Date Additional Medicare IM given:    Discharge Disposition:    Per UR Regulation:    If discussed at Long Length of Stay Meetings, dates discussed:    Comments:  07/29/13 Claretha Cooper RN CM Pt was able to ambulate without O2 desating. No O2 needed.  07/28/13 Lizmary Nader RN [place]: [phone], fax [phone]

## 2013-07-28 NOTE — Progress Notes (Signed)
Dr> Memon notified of patients possible run of SVT and pause this morning as well as low heart rate and low BP. Will hold medications until he is seen by Dr. Roderic Palau.

## 2013-07-28 NOTE — Progress Notes (Signed)
Patient jumping out the bed, yelling he is losing his mind, he says he needs sleep because he is losing his mind and doesn't know why the bed alarm keeps going off. Is threatening to leave if he doe not get any sleep. Paged Hospitalist for something for sleep. William Hamburger RN

## 2013-07-28 NOTE — Progress Notes (Signed)
Patient up in chair. Tolerating well. No complaints voiced at this time.

## 2013-07-28 NOTE — Progress Notes (Signed)
[place] for Warfarin / Lovenox Indication: atrial fibrillation plus Hx DVT/PE  Allergies  Allergen Reactions  . Codeine Shortness Of Breath    Shortness of breath   Patient Measurements: Height: 5\' 8"  (172.7 cm) Weight: 177 lb 11.2 oz (80.604 kg) IBW/kg (Calculated) : 68.4  Vital Signs: Temp: 97.4 F (36.3 C) (06/08 1514) Temp src: Oral (06/08 1514) BP: 132/81 mmHg (06/08 1514) Pulse Rate: 65 (06/08 1125)  Labs:  Recent Labs  07/26/13 1128  07/26/13 1132 07/27/13 0023 07/27/13 0653 07/27/13 1241 07/28/13 0712  HGB  --   < > 11.5*  --  11.1*  --  10.7*  HCT  --   --  36.2*  --  34.4*  --  33.3*  PLT  --   --  140*  --  146*  --  138*  LABPROT 13.7  --   --   --   --   --  14.9  INR 1.07  --   --   --   --   --  1.20  CREATININE  --   --  1.28  --  1.05  --  1.19  TROPONINI  --   < > <0.30 <0.30 <0.30 <0.30  --   < > = values in this interval not displayed.  Estimated Creatinine Clearance: 40.7 ml/min (by C-G formula based on Cr of 1.19).  Medical History: Past Medical History  Diagnosis Date  . Colon cancer     Status post partial colectomy  . Hypertension   . Hypercholesterolemia   . Blood transfusion   . Coronary artery disease 2011    Non obstructive CAD  . DVT (deep venous thrombosis)   . PE (pulmonary embolism)   . Diabetes mellitus without complication   . A-fib   . Diabetes mellitus, type II   . Ventral hernia   . Right bundle branch block   . Unspecified hypothyroidism   . LVH (left ventricular hypertrophy) 10/10/2012    Ejection fraction 60-65%.  . Bilateral renal cysts   . Calculus of left kidney     non-obstructing  . COPD (chronic obstructive pulmonary disease)    Medications:  Prescriptions prior to admission  Medication Sig Dispense Refill  . albuterol (PROVENTIL HFA) 108 (90 BASE) MCG/ACT inhaler Inhale 2 puffs into the lungs every 6 (six) hours as needed for wheezing or shortness of breath.       Marland Kitchen atorvastatin (LIPITOR) 80 MG tablet Take 80 mg by mouth daily.      . famotidine (PEPCID) 20 MG tablet Take 1 tablet (20 mg total) by mouth 2 (two) times daily.      . furosemide (LASIX) 20 MG tablet Take 20 mg by mouth every morning.      . isosorbide mononitrate (IMDUR) 30 MG 24 hr tablet Take 1 tablet (30 mg total) by mouth daily. Long acting nitroglycerin medicine for your heart.  30 tablet  6  . levothyroxine (SYNTHROID, LEVOTHROID) 50 MCG tablet Take 50 mcg by mouth daily before breakfast.      . lisinopril (PRINIVIL,ZESTRIL) 10 MG tablet Take 5 mg by mouth daily.      . metoprolol tartrate (LOPRESSOR) 25 MG tablet Take 12.5 mg by mouth 2 (two) times daily.      Marland Kitchen terazosin (HYTRIN) 1 MG capsule Take 1 mg by mouth at bedtime.       Assessment: 80 yo male, with Afib and Hx DVT/PE.  Apparently on warfarin  at home but INR is at baseline.  Per MD Note: "From what the patient describes, it appears that he may have been on coumadin in the past, but then was switched to lovenox injections when he had a VTE on coumadin. Will start the patient on lovenox bridge. Need to get records from [place] to verify anticoagulation regimen." atrile fibrillation, rate controlled, continue Coumadin per MD  INR remain below goal and will add Lovenox bridge therapy per MD request.    Goal of Therapy:  INR 2-3  Plan:  Coumadin 5mg  po today x 1. Daily PT/INR Lovenox 1mg /kg SQ BID. CBC daily for PLTC monitoring.  Madgeline Rayo A Victoria Euceda 07/28/2013,3:45 PM

## 2013-07-28 NOTE — Progress Notes (Signed)
[facility] PROGRESS NOTE  Bryce Mcdonald HAL:[id number] DOB: [birth year] DOA: 07/26/2013 PCP: PROVIDER NOT IN SYSTEM  Assessment/Plan: Acute on chronic diastolic congestive heart failure. BNP is above his baseline and he has clinical signs of congestive heart failure. He has been started on intravenous lasix and has had good urine output. He is already on ACE inhibitor and beta blocker. Last echocardiogram was done a year ago, this will be updated. We'll monitor strict intake and output. Continue current treatments. He still appears to be volume overloaded.  Will continue with IV lasix for today and can probably transition to PO tomorrow.  Acute respiratory failure due to #1. Wean off oxygen as tolerated. He became short of breath and hypoxic on ambulation.  Will continue wean off as tolerated.  Hypertension. Continue outpatient regimen   Atrial fibrillation. Rate controlled. Continue on Coumadin.   History of DVT/PE on chronic anticoagulation. INR is normal.  It is unclear if he has been prescribed coumadin.  From what the patient describes, it appears that he may have been on coumadin in the past, but then was switched to lovenox injections when he had a VTE on coumadin.  Will start the patient on lovenox bridge.  Need to get records from [place] to verify anticoagulation regimen.  Hyperlipidemia. Continue statin.   Leukopenia, appears to be a chronic issue, to be followed as an outpatient.   Code Status: full code Family Communication: no family present Disposition Plan: discharge home once improved   Consultants:    Procedures:    Antibiotics:    HPI/Subjective: Overall doing better. Shortness of breath is improving.    Objective: Filed Vitals:   07/28/13 1125  BP: 128/49  Pulse: 65  Temp:   Resp:     Intake/Output Summary (Last 24 hours) at 07/28/13 1201 Last data filed at 07/28/13 1145  Gross per 24 hour  Intake    240 ml  Output   1775 ml  Net  -1535 ml    Filed Weights   07/26/13 1540 07/27/13 0432 07/28/13 0520  Weight: 82.2 kg (181 lb 3.5 oz) 81.5 kg (179 lb 10.8 oz) 80.604 kg (177 lb 11.2 oz)    Exam:   General:  NAD  Cardiovascular: s1, s2, irregular  Respiratory: crackles at bases  Abdomen: soft, nt, nd, bs+  Musculoskeletal: 1+ edema b/l   Data Reviewed: Basic Metabolic Panel:  Recent Labs Lab 07/26/13 1132 07/27/13 0653 07/28/13 0712  NA 142 141 143  K 3.9 3.9 3.7  CL 103 101 100  CO2 27 28 30   GLUCOSE 110* 124* 107*  BUN 23 21 28*  CREATININE 1.28 1.05 1.19  CALCIUM 9.6 9.8 9.4   Liver Function Tests: No results found for this basename: AST, ALT, ALKPHOS, BILITOT, PROT, ALBUMIN,  in the last 168 hours No results found for this basename: LIPASE, AMYLASE,  in the last 168 hours No results found for this basename: AMMONIA,  in the last 168 hours CBC:  Recent Labs Lab 07/26/13 1132 07/27/13 0653 07/28/13 0712  WBC 2.7* 2.3* 3.1*  NEUTROABS 1.0*  --   --   HGB 11.5* 11.1* 10.7*  HCT 36.2* 34.4* 33.3*  MCV 97.3 95.6 96.2  PLT 140* 146* 138*   Cardiac Enzymes:  Recent Labs Lab 07/26/13 1132 07/27/13 0023 07/27/13 0653 07/27/13 1241  TROPONINI <0.30 <0.30 <0.30 <0.30   BNP (last 3 results)  Recent Labs  11/10/12 2136 06/26/13 0401 07/26/13 1128  PROBNP 1765.0* 1461.0* 2036.0*   CBG:  Recent Labs Lab 07/26/13 1709 07/26/13 2210 07/27/13 0743 07/28/13 0717  GLUCAP 125* 159* 135* 100*    Recent Results (from the past 240 hour(s))  URINE CULTURE     Status: None   Collection Time    07/26/13 11:59 AM      Result Value Ref Range Status   Specimen Description URINE, CLEAN CATCH   Final   Special Requests NONE   Final   Culture  Setup Time     Final   Value: 07/26/2013 21:10     Performed at [place] PENDING   Incomplete   Culture     Final   Value: Culture reincubated for better growth     Performed at [facility]   Report Status PENDING    Incomplete     Studies: No results found.  Scheduled Meds: . aspirin EC  81 mg Oral Daily  . atorvastatin  80 mg Oral Daily  . enoxaparin (LOVENOX) injection  1 mg/kg Subcutaneous BID  . famotidine  20 mg Oral BID  . furosemide  40 mg Intravenous BID  . ipratropium-albuterol  3 mL Nebulization QID  . isosorbide mononitrate  30 mg Oral Daily  . levothyroxine  50 mcg Oral QAC breakfast  . lisinopril  5 mg Oral Daily  . metoprolol tartrate  12.5 mg Oral BID  . sodium chloride  3 mL Intravenous Q12H  . terazosin  1 mg Oral QHS  . Warfarin - Pharmacist Dosing Inpatient   Does not apply Q24H   Continuous Infusions:   Active Problems:   HTN (hypertension)   Hyperlipidemia   Atrial fibrillation with slow ventricular response   Chronic anticoagulation   Leukopenia   Dyspnea   Acute respiratory failure   Acute on chronic diastolic CHF (congestive heart failure)    Time spent: 72mins    Jehanzeb Memon  [facility] Pager [phone]. If 7PM-7AM, please contact night-coverage at [email or web address], password [facility] 07/28/2013, 12:01 PM  LOS: 2 days

## 2013-07-29 LAB — BASIC METABOLIC PANEL
BUN: 26 mg/dL — AB (ref 6–23)
CO2: 31 mEq/L (ref 19–32)
Calcium: 9.5 mg/dL (ref 8.4–10.5)
Chloride: 99 mEq/L (ref 96–112)
Creatinine, Ser: 1.18 mg/dL (ref 0.50–1.35)
GFR calc non Af Amer: 53 mL/min — ABNORMAL LOW (ref >90)
GFR, EST AFRICAN AMERICAN: 61 mL/min — AB (ref >90)
GLUCOSE: 110 mg/dL — AB (ref 70–99)
Potassium: 3.5 mEq/L — ABNORMAL LOW (ref 3.7–5.3)
Sodium: 142 mEq/L (ref 137–147)

## 2013-07-29 LAB — CBC
HEMATOCRIT: 36.6 % — AB (ref 39.0–52.0)
Hemoglobin: 12 g/dL — ABNORMAL LOW (ref 13.0–17.0)
MCH: 31 pg (ref 26.0–34.0)
MCHC: 32.8 g/dL (ref 30.0–36.0)
MCV: 94.6 fL (ref 78.0–100.0)
Platelets: 154 10*3/uL (ref 150–400)
RBC: 3.87 MIL/uL — AB (ref 4.22–5.81)
RDW: 15.4 % (ref 11.5–15.5)
WBC: 3 10*3/uL — ABNORMAL LOW (ref 4.0–10.5)

## 2013-07-29 LAB — PROTIME-INR
INR: 1.29 (ref 0.00–1.49)
Prothrombin Time: 15.8 seconds — ABNORMAL HIGH (ref 11.6–15.2)

## 2013-07-29 LAB — GLUCOSE, CAPILLARY: GLUCOSE-CAPILLARY: 98 mg/dL (ref 70–99)

## 2013-07-29 MED ORDER — ENOXAPARIN SODIUM 80 MG/0.8ML ~~LOC~~ SOLN: 100.0000 mg | Freq: Every day | SUBCUTANEOUS | Status: DC

## 2013-07-29 MED ORDER — POTASSIUM CHLORIDE CRYS ER 20 MEQ PO TBCR
40.0000 meq | EXTENDED_RELEASE_TABLET | Freq: Once | ORAL | Status: AC
  Administered 2013-07-29: 40 meq via ORAL
  Filled 2013-07-29: qty 2

## 2013-07-29 MED ORDER — WARFARIN SODIUM 5 MG PO TABS
10.0000 mg | ORAL_TABLET | Freq: Once | ORAL | Status: AC
  Administered 2013-07-29: 10 mg via ORAL
  Filled 2013-07-29: qty 2

## 2013-07-29 MED ORDER — FUROSEMIDE 40 MG PO TABS: 40.0000 mg | ORAL_TABLET | Freq: Every morning | ORAL | Status: DC

## 2013-07-29 MED ORDER — ZOLPIDEM TARTRATE 5 MG PO TABS
5.0000 mg | ORAL_TABLET | Freq: Once | ORAL | Status: AC
  Administered 2013-07-29: 5 mg via ORAL
  Filled 2013-07-29: qty 1

## 2013-07-29 MED ORDER — LISINOPRIL 10 MG PO TABS: 10.0000 mg | ORAL_TABLET | Freq: Every day | ORAL | Status: DC

## 2013-07-29 NOTE — Discharge Instructions (Signed)
Heart Failure °Heart failure is a condition in which the heart has trouble pumping blood. This means your heart does not pump blood efficiently for your body to work well. In some cases of heart failure, fluid may back up into your lungs or you may have swelling (edema) in your lower legs. Heart failure is usually a long-term (chronic) condition. It is important for you to take good care of yourself and follow your caregiver's treatment plan. °CAUSES  °Some health conditions can cause heart failure. Those health conditions include: °· High blood pressure (hypertension) causes the heart muscle to work harder than normal. When pressure in the blood vessels is high, the heart needs to pump (contract) with more force in order to circulate blood throughout the body. High blood pressure eventually causes the heart to become stiff and weak. °· Coronary artery disease (CAD) is the buildup of cholesterol and fat (plaque) in the arteries of the heart. The blockage in the arteries deprives the heart muscle of oxygen and blood. This can cause chest pain and may lead to a heart attack. High blood pressure can also contribute to CAD. °· Heart attack (myocardial infarction) occurs when 1 or more arteries in the heart become blocked. The loss of oxygen damages the muscle tissue of the heart. When this happens, part of the heart muscle dies. The injured tissue does not contract as well and weakens the heart's ability to pump blood. °· Abnormal heart valves can cause heart failure when the heart valves do not open and close properly. This makes the heart muscle pump harder to keep the blood flowing. °· Heart muscle disease (cardiomyopathy or myocarditis) is damage to the heart muscle from a variety of causes. These can include drug or alcohol abuse, infections, or unknown reasons. These can increase the risk of heart failure. °· Lung disease makes the heart work harder because the lungs do not work properly. This can cause a strain  on the heart, leading it to fail. °· Diabetes increases the risk of heart failure. High blood sugar contributes to high fat (lipid) levels in the blood. Diabetes can also cause slow damage to tiny blood vessels that carry important nutrients to the heart muscle. When the heart does not get enough oxygen and food, it can cause the heart to become weak and stiff. This leads to a heart that does not contract efficiently. °· Other conditions can contribute to heart failure. These include abnormal heart rhythms, thyroid problems, and low blood counts (anemia). °Certain unhealthy behaviors can increase the risk of heart failure. Those unhealthy behaviors include: °· Being overweight. °· Smoking or chewing tobacco. °· Eating foods high in fat and cholesterol. °· Abusing illicit drugs or alcohol. °· Lacking physical activity. °SYMPTOMS  °Heart failure symptoms may vary and can be hard to detect. Symptoms may include: °· Shortness of breath with activity, such as climbing stairs. °· Persistent cough. °· Swelling of the feet, ankles, legs, or abdomen. °· Unexplained weight gain. °· Difficulty breathing when lying flat (orthopnea). °· Waking from sleep because of the need to sit up and get more air. °· Rapid heartbeat. °· Fatigue and loss of energy. °· Feeling lightheaded, dizzy, or close to fainting. °· Loss of appetite. °· Nausea. °· Increased urination during the night (nocturia). °DIAGNOSIS  °A diagnosis of heart failure is based on your history, symptoms, physical examination, and diagnostic tests. °Diagnostic tests for heart failure may include: °· Echocardiography. °· Electrocardiography. °· Chest X-ray. °· Blood tests. °· Exercise   stress test. °· Cardiac angiography. °· Radionuclide scans. °TREATMENT  °Treatment is aimed at managing the symptoms of heart failure. Medicines, behavioral changes, or surgical intervention may be necessary to treat heart failure. °· Medicines to help treat heart failure may  include: °· Angiotensin-converting enzyme (ACE) inhibitors. This type of medicine blocks the effects of a blood protein called angiotensin-converting enzyme. ACE inhibitors relax (dilate) the blood vessels and help lower blood pressure. °· Angiotensin receptor blockers. This type of medicine blocks the actions of a blood protein called angiotensin. Angiotensin receptor blockers dilate the blood vessels and help lower blood pressure. °· Water pills (diuretics). Diuretics cause the kidneys to remove salt and water from the blood. The extra fluid is removed through urination. This loss of extra fluid lowers the volume of blood the heart pumps. °· Beta blockers. These prevent the heart from beating too fast and improve heart muscle strength. °· Digitalis. This increases the force of the heartbeat. °· Healthy behavior changes include: °· Obtaining and maintaining a healthy weight. °· Stopping smoking or chewing tobacco. °· Eating heart healthy foods. °· Limiting or avoiding alcohol. °· Stopping illicit drug use. °· Physical activity as directed by your caregiver. °· Surgical treatment for heart failure may include: °· A procedure to open blocked arteries, repair damaged heart valves, or remove damaged heart muscle tissue. °· A pacemaker to improve heart muscle function and control certain abnormal heart rhythms. °· An internal cardioverter defibrillator to treat certain serious abnormal heart rhythms. °· A left ventricular assist device to assist the pumping ability of the heart. °HOME CARE INSTRUCTIONS  °· Take your medicine as directed by your caregiver. Medicines are important in reducing the workload of your heart, slowing the progression of heart failure, and improving your symptoms. °· Do not stop taking your medicine unless directed by your caregiver. °· Do not skip any dose of medicine. °· Refill your prescriptions before you run out of medicine. Your medicines are needed every day. °· Take over-the-counter  medicine only as directed by your caregiver or pharmacist. °· Engage in moderate physical activity if directed by your caregiver. Moderate physical activity can benefit some people. The elderly and people with severe heart failure should consult with a caregiver for physical activity recommendations. °· Eat heart healthy foods. Food choices should be free of trans fat and low in saturated fat, cholesterol, and salt (sodium). Healthy choices include fresh or frozen fruits and vegetables, fish, lean meats, legumes, fat-free or low-fat dairy products, and whole grain or high fiber foods. Talk to a dietitian to learn more about heart healthy foods. °· Limit sodium if directed by your caregiver. Sodium restriction may reduce symptoms of heart failure in some people. Talk to a dietitian to learn more about heart healthy seasonings. °· Use healthy cooking methods. Healthy cooking methods include roasting, grilling, broiling, baking, poaching, steaming, or stir-frying. Talk to a dietitian to learn more about healthy cooking methods. °· Limit fluids if directed by your caregiver. Fluid restriction may reduce symptoms of heart failure in some people. °· Weigh yourself every day. Daily weights are important in the early recognition of excess fluid. You should weigh yourself every morning after you urinate and before you eat breakfast. Wear the same amount of clothing each time you weigh yourself. Record your daily weight. Provide your caregiver with your weight record. °· Monitor and record your blood pressure if directed by your caregiver. °· Check your pulse if directed by your caregiver. °· Lose weight if directed   by your caregiver. Weight loss may reduce symptoms of heart failure in some people. °· Stop smoking or chewing tobacco. Nicotine makes your heart work harder by causing your blood vessels to constrict. Do not use nicotine gum or patches before talking to your caregiver. °· Schedule and attend follow-up visits as  directed by your caregiver. It is important to keep all your appointments. °· Limit alcohol intake to no more than 1 drink per day for nonpregnant women and 2 drinks per day for men. Drinking more than that is harmful to your heart. Tell your caregiver if you drink alcohol several times a week. Talk with your caregiver about whether alcohol is safe for you. If your heart has already been damaged by alcohol or you have severe heart failure, drinking alcohol should be stopped completely. °· Stop illicit drug use. °· Stay up-to-date with immunizations. It is especially important to prevent respiratory infections through current pneumococcal and influenza immunizations. °· Manage other health conditions such as hypertension, diabetes, thyroid disease, or abnormal heart rhythms as directed by your caregiver. °· Learn to manage stress. °· Plan rest periods when fatigued. °· Learn strategies to manage high temperatures. If the weather is extremely hot: °· Avoid vigorous physical activity. °· Use air conditioning or fans or seek a cooler location. °· Avoid caffeine and alcohol. °· Wear loose-fitting, lightweight, and light-colored clothing. °· Learn strategies to manage cold temperatures. If the weather is extremely cold: °· Avoid vigorous physical activity. °· Layer clothes. °· Wear mittens or gloves, a hat, and a scarf when going outside. °· Avoid alcohol. °· Obtain ongoing education and support as needed. °· Participate or seek rehabilitation as needed to maintain or improve independence and quality of life. °SEEK MEDICAL CARE IF:  °· Your weight increases by 03 lb/1.4 kg in 1 day or 05 lb/2.3 kg in a week. °· You have increasing shortness of breath that is unusual for you. °· You are unable to participate in your usual physical activities. °· You tire easily. °· You cough more than normal, especially with physical activity. °· You have any or more swelling in areas such as your hands, feet, ankles, or abdomen. °· You  are unable to sleep because it is hard to breathe. °· You feel like your heart is beating fast (palpitations). °· You become dizzy or lightheaded upon standing up. °SEEK IMMEDIATE MEDICAL CARE IF:  °· You have difficulty breathing. °· There is a change in mental status such as decreased alertness or difficulty with concentration. °· You have a pain or discomfort in your chest. °· You have an episode of fainting (syncope). °MAKE SURE YOU:  °· Understand these instructions. °· Will watch your condition. °· Will get help right away if you are not doing well or get worse. °Document Released: 02/06/2005 Document Revised: 06/03/2012 Document Reviewed: 02/29/2012 °Exit[employer]® Patient Information ©2014 [employer]. ° °

## 2013-07-29 NOTE — Progress Notes (Signed)
Went over discharged instructions with patient. Patient discharged in no acute distress, IV removed, patient transported via wheelchair.

## 2013-07-29 NOTE — Progress Notes (Signed)
[facility] for Warfarin / Lovenox Indication: atrial fibrillation plus Hx DVT/PE  Allergies  Allergen Reactions  . Codeine Shortness Of Breath    Shortness of breath   Patient Measurements: Height: 5\' 8"  (172.7 cm) Weight: 172 lb (78.019 kg) IBW/kg (Calculated) : 68.4  Vital Signs: Temp: 97.7 F (36.5 C) (06/09 0743) Temp src: Oral (06/09 0743) BP: 138/67 mmHg (06/09 0743) Pulse Rate: 64 (06/09 0743)  Labs:  Recent Labs  07/26/13 1128  07/27/13 0023 07/27/13 6433 07/27/13 1241 07/28/13 0712 07/29/13 0634  HGB  --   < >  --  11.1*  --  10.7* 12.0*  HCT  --   < >  --  34.4*  --  33.3* 36.6*  PLT  --   < >  --  146*  --  138* 154  LABPROT 13.7  --   --   --   --  14.9 15.8*  INR 1.07  --   --   --   --  1.20 1.29  CREATININE  --   < >  --  1.05  --  1.19 1.18  TROPONINI  --   < > <0.30 <0.30 <0.30  --   --   < > = values in this interval not displayed.  Estimated Creatinine Clearance: 41.1 ml/min (by C-G formula based on Cr of 1.18).  Medical History: Past Medical History  Diagnosis Date  . Colon cancer     Status post partial colectomy  . Hypertension   . Hypercholesterolemia   . Blood transfusion   . Coronary artery disease 2011    Non obstructive CAD  . DVT (deep venous thrombosis)   . PE (pulmonary embolism)   . Diabetes mellitus without complication   . A-fib   . Diabetes mellitus, type II   . Ventral hernia   . Right bundle branch block   . Unspecified hypothyroidism   . LVH (left ventricular hypertrophy) 10/10/2012    Ejection fraction 60-65%.  . Bilateral renal cysts   . Calculus of left kidney     non-obstructing  . COPD (chronic obstructive pulmonary disease)    Medications:  Prescriptions prior to admission  Medication Sig Dispense Refill  . albuterol (PROVENTIL HFA) 108 (90 BASE) MCG/ACT inhaler Inhale 2 puffs into the lungs every 6 (six) hours as needed for wheezing or shortness of breath.      Marland Kitchen  atorvastatin (LIPITOR) 80 MG tablet Take 80 mg by mouth daily.      . famotidine (PEPCID) 20 MG tablet Take 1 tablet (20 mg total) by mouth 2 (two) times daily.      . furosemide (LASIX) 20 MG tablet Take 20 mg by mouth every morning.      . isosorbide mononitrate (IMDUR) 30 MG 24 hr tablet Take 1 tablet (30 mg total) by mouth daily. Long acting nitroglycerin medicine for your heart.  30 tablet  6  . levothyroxine (SYNTHROID, LEVOTHROID) 50 MCG tablet Take 50 mcg by mouth daily before breakfast.      . lisinopril (PRINIVIL,ZESTRIL) 10 MG tablet Take 5 mg by mouth daily.      . metoprolol tartrate (LOPRESSOR) 25 MG tablet Take 12.5 mg by mouth 2 (two) times daily.      Marland Kitchen terazosin (HYTRIN) 1 MG capsule Take 1 mg by mouth at bedtime.       Assessment: 80 yo male, with Afib and Hx DVT/PE.  Apparently on warfarin at home but INR  is at baseline.  Per MD Note: "From what the patient describes, it appears that he may have been on coumadin in the past, but then was switched to lovenox injections when he had a VTE on coumadin. Will start the patient on lovenox bridge. Need to get records from [place] to verify anticoagulation regimen." atrial fibrillation, rate controlled, continue Coumadin per MD  INR remains below goal, sluggish response.  Continue Lovenox bridge therapy per MD request.    Goal of Therapy:  INR 2-3  Plan:  Coumadin 10mg  po today x 1. Daily PT/INR Lovenox 1mg /kg SQ BID. CBC daily for PLTC monitoring.  Lyliana Dicenso A Wylene Weissman 07/29/2013,10:19 AM

## 2013-07-29 NOTE — Progress Notes (Signed)
Pt ambulated in hallway, approximately 200 feet with front wheel walker.  Pt's oxygen saturation on room air ranged between 95-97 %. No complaints of SOB. Pt tolerated well.

## 2013-07-29 NOTE — Discharge Summary (Signed)
Physician Discharge Summary  Bryce Mcdonald OJJ:[id number] DOB: August 24, 1921 DOA: 07/26/2013  PCP: PROVIDER NOT IN SYSTEM  Admit date: 07/26/2013 Discharge date: 07/29/2013  Time spent: 40 minutes  Recommendations for Outpatient Follow-up:  1. Followup with primary care physician 1-2 weeks 2. Follow with cardiologist at [facility] in one week  Discharge Diagnoses:  Active Problems:   HTN (hypertension)   Hyperlipidemia   Atrial fibrillation with slow ventricular response   Chronic anticoagulation   Leukopenia   Dyspnea   Acute respiratory failure   Acute on chronic diastolic CHF (congestive heart failure)   Discharge Condition: Improved  Diet recommendation: Low salt  Filed Weights   07/27/13 0432 07/28/13 0520 07/29/13 0743  Weight: 81.5 kg (179 lb 10.8 oz) 80.604 kg (177 lb 11.2 oz) 78.019 kg (172 lb)    History of present illness:  Bryce Mcdonald is a 80 y.o. male who presents to the emergency room with progressive shortness of breath. Patient is a poor historian it is difficult to ascertain the exact details of his illness. He describes having a aggressive shortness of breath the past 2-3 weeks, worse over the past 2-3 days. Has been associated with wheezing. His shortness of breath got significantly worse today which prompted his ER arrival. It is unclear whether he's had any significant chest pain. He does report having a cough productive of thick white sputum. He's noticed worsening lower extremity edema. The patient has a history of pulmonary embolus and DVT as well as atrial fibrillation and is chronically on anticoagulation. He reports compliance with his Coumadin, although INR is unavailable at this time. He was evaluated in the emergency room and noted to have significant lower extremity edema. He was given a dose of Lasix and then had significant urine output. The patient is feeling improved. He did become hypoxic on room air and required supplemental oxygen. He is being  admitted for further treatment   Hospital Course:  Patient was admitted to the hospital with progressive shortness of breath. He was found to have acute on chronic congestive heart failure. He was started on intravenous Lasix and had improvement in his symptoms. He had significant diuresis of 9 pounds during his hospital stay. His discharge weight was 70 kg. Repeat echocardiogram indicated his ejection fraction was lower than prior study 40-50%. He'll followup with his cardiologist at the [facility] for further treatments. He is already on beta blocker, ACE inhibitor and diuretic. Patient is now ambulating on room air without difficulty. He feels ready for discharge home.  Procedures: Echo:There is moderate LVH with LVEF 40-50% in the setting of apparent atrial fibrillation. Indeterminate diastolic function. Mild left atrial enlargement. MAC with mild mitral regurgitation. Sclerotic aortic valve. Mildly reduced RV contraction. Mild to moderate tricuspid regurgitation with elevated PASP 60 mmHg and increased CVP. Trivial pericardial effusion.    Consultations:    Discharge Exam: Filed Vitals:   07/29/13 1431  BP: 122/58  Pulse: 87  Temp: 97.4 F (36.3 C)  Resp: 16    General: No acute distress Cardiovascular: S1, S2, irregular Respiratory: Clear to auscultation bilaterally  Discharge Instructions You were cared for by a hospitalist during your hospital stay. If you have any questions about your discharge medications or the care you received while you were in the hospital after you are discharged, you can call the unit and asked to speak with the hospitalist on call if the hospitalist that took care of you is not available. Once you are discharged, your primary care physician  will handle any further medical issues. Please note that NO REFILLS for any discharge medications will be authorized once you are discharged, as it is imperative that you return to your primary care physician (or  establish a relationship with a primary care physician if you do not have one) for your aftercare needs so that they can reassess your need for medications and monitor your lab values.  Discharge Instructions   (HEART FAILURE PATIENTS) Call MD:  Anytime you have any of the following symptoms: 1) 3 pound weight gain in 24 hours or 5 pounds in 1 week 2) shortness of breath, with or without a dry hacking cough 3) swelling in the hands, feet or stomach 4) if you have to sleep on extra pillows at night in order to breathe.    Complete by:  As directed      Diet - low sodium heart healthy    Complete by:  As directed      Increase activity slowly    Complete by:  As directed             Medication List         atorvastatin 80 MG tablet  Commonly known as:  LIPITOR  Take 80 mg by mouth daily.     enoxaparin 80 MG/0.8ML injection  Commonly known as:  LOVENOX  Inject 1 mL (100 mg total) into the skin daily.     famotidine 20 MG tablet  Commonly known as:  PEPCID  Take 1 tablet (20 mg total) by mouth 2 (two) times daily.     furosemide 40 MG tablet  Commonly known as:  LASIX  Take 1 tablet (40 mg total) by mouth every morning.     isosorbide mononitrate 30 MG 24 hr tablet  Commonly known as:  IMDUR  Take 1 tablet (30 mg total) by mouth daily. Long acting nitroglycerin medicine for your heart.     levothyroxine 50 MCG tablet  Commonly known as:  SYNTHROID, LEVOTHROID  Take 50 mcg by mouth daily before breakfast.     lisinopril 10 MG tablet  Commonly known as:  PRINIVIL,ZESTRIL  Take 1 tablet (10 mg total) by mouth daily.     metoprolol tartrate 25 MG tablet  Commonly known as:  LOPRESSOR  Take 12.5 mg by mouth 2 (two) times daily.     PROVENTIL HFA 108 (90 BASE) MCG/ACT inhaler  Generic drug:  albuterol  Inhale 2 puffs into the lungs every 6 (six) hours as needed for wheezing or shortness of breath.     terazosin 1 MG capsule  Commonly known as:  HYTRIN  Take 1 mg by mouth  at bedtime.       Allergies  Allergen Reactions  . Codeine Shortness Of Breath    Shortness of breath       Follow-up Information   Follow up with follow up with your primary care doctor and cardiologist in 1-2 weeks.       The results of significant diagnostics from this hospitalization (including imaging, microbiology, ancillary and laboratory) are listed below for reference.    Significant Diagnostic Studies: Dg Chest 2 View  07/09/2013   CLINICAL DATA:  Shortness of breath, dysphagia, and lower extremity swelling  EXAM: CHEST  2 VIEW  COMPARISON:  Portable chest x-ray of Jun 26, 2013  FINDINGS: Lungs mildly hyperinflated with hemidiaphragm flattening. Stable coarse increased interstitial lung markings at the right lung base. Cardiopericardial silhouette normal in size. Pulmonary vascularity not engorged.  Mediastinum normal in width. Trachea midline. Bony thorax normal where visualized  IMPRESSION: Stable coarse interstitial lung markings in the right infrahilar region consistent with scarring ; underlying COPD. No evidence of CHF.   Electronically Signed   By: David  Martinique   On: 07/09/2013 11:08   Dg Chest Portable 1 View  07/26/2013   CLINICAL DATA:  Shortness of breath and weakness.  EXAM: PORTABLE CHEST - 1 VIEW  COMPARISON:  07/09/2013, 06/26/2013 and 12/29/2012  FINDINGS: Lungs are adequately inflated with chronic bibasilar scarring. There is no focal consolidation or effusion. There is mild stable cardiomegaly. There is minimal calcified plaque over the aortic arch. Remainder the exam is unchanged.  IMPRESSION: No acute cardiopulmonary disease.  Chronic stable bibasilar scarring.  Mild stable cardiomegaly.   Electronically Signed   By: Marin Olp M.D.   On: 07/26/2013 11:59    Microbiology: Recent Results (from the past 240 hour(s))  URINE CULTURE     Status: None   Collection Time    07/26/13 11:59 AM      Result Value Ref Range Status   Specimen Description URINE, CLEAN  CATCH   Final   Special Requests NONE   Final   Culture  Setup Time     Final   Value: 07/26/2013 21:10     Performed at [place]     Final   Value: 35,000 COLONIES/ML     Performed at [facility]   Culture     Final   Value: LACTOBACILLUS SPECIES     Note: Standardized susceptibility testing for this organism is not available.     Performed at [facility]   Report Status 07/28/2013 FINAL   Final     Labs: Basic Metabolic Panel:  Recent Labs Lab 07/26/13 1132 07/27/13 0653 07/28/13 0712 07/29/13 0634  NA 142 141 143 142  K 3.9 3.9 3.7 3.5*  CL 103 101 100 99  CO2 27 28 30 31   GLUCOSE 110* 124* 107* 110*  BUN 23 21 28* 26*  CREATININE 1.28 1.05 1.19 1.18  CALCIUM 9.6 9.8 9.4 9.5   Liver Function Tests: No results found for this basename: AST, ALT, ALKPHOS, BILITOT, PROT, ALBUMIN,  in the last 168 hours No results found for this basename: LIPASE, AMYLASE,  in the last 168 hours No results found for this basename: AMMONIA,  in the last 168 hours CBC:  Recent Labs Lab 07/26/13 1132 07/27/13 0653 07/28/13 0712 07/29/13 0634  WBC 2.7* 2.3* 3.1* 3.0*  NEUTROABS 1.0*  --   --   --   HGB 11.5* 11.1* 10.7* 12.0*  HCT 36.2* 34.4* 33.3* 36.6*  MCV 97.3 95.6 96.2 94.6  PLT 140* 146* 138* 154   Cardiac Enzymes:  Recent Labs Lab 07/26/13 1132 07/27/13 0023 07/27/13 0653 07/27/13 1241  TROPONINI <0.30 <0.30 <0.30 <0.30   BNP: BNP (last 3 results)  Recent Labs  11/10/12 2136 06/26/13 0401 07/26/13 1128  PROBNP 1765.0* 1461.0* 2036.0*   CBG:  Recent Labs Lab 07/26/13 1709 07/26/13 2210 07/27/13 0743 07/28/13 0717 07/29/13 0721  GLUCAP 125* 159* 135* 100* 98       Signed:  Nylia Gavina  [facility] 07/29/2013, 8:37 PM

## 2013-08-10 ENCOUNTER — Emergency Department (HOSPITAL_COMMUNITY): Payer: Non-veteran care

## 2013-08-10 ENCOUNTER — Encounter (HOSPITAL_COMMUNITY): Payer: Self-pay | Admitting: Emergency Medicine

## 2013-08-10 ENCOUNTER — Emergency Department (HOSPITAL_COMMUNITY)
Admission: EM | Admit: 2013-08-10 | Discharge: 2013-08-10 | Disposition: A | Discharge status: Home / Self Care | Payer: Non-veteran care | Attending: Emergency Medicine | Admitting: Emergency Medicine

## 2013-08-10 DIAGNOSIS — Z86711 Personal history of pulmonary embolism: Secondary | ICD-10-CM | POA: Insufficient documentation

## 2013-08-10 DIAGNOSIS — Z87891 Personal history of nicotine dependence: Secondary | ICD-10-CM | POA: Insufficient documentation

## 2013-08-10 DIAGNOSIS — R609 Edema, unspecified: Secondary | ICD-10-CM | POA: Insufficient documentation

## 2013-08-10 DIAGNOSIS — Z9889 Other specified postprocedural states: Secondary | ICD-10-CM | POA: Insufficient documentation

## 2013-08-10 DIAGNOSIS — Z85038 Personal history of other malignant neoplasm of large intestine: Secondary | ICD-10-CM | POA: Insufficient documentation

## 2013-08-10 DIAGNOSIS — Z7901 Long term (current) use of anticoagulants: Secondary | ICD-10-CM | POA: Insufficient documentation

## 2013-08-10 DIAGNOSIS — Z8719 Personal history of other diseases of the digestive system: Secondary | ICD-10-CM | POA: Insufficient documentation

## 2013-08-10 DIAGNOSIS — Z79899 Other long term (current) drug therapy: Secondary | ICD-10-CM | POA: Insufficient documentation

## 2013-08-10 DIAGNOSIS — I2510 Atherosclerotic heart disease of native coronary artery without angina pectoris: Secondary | ICD-10-CM | POA: Insufficient documentation

## 2013-08-10 DIAGNOSIS — Q619 Cystic kidney disease, unspecified: Secondary | ICD-10-CM | POA: Insufficient documentation

## 2013-08-10 DIAGNOSIS — E119 Type 2 diabetes mellitus without complications: Secondary | ICD-10-CM | POA: Insufficient documentation

## 2013-08-10 DIAGNOSIS — J441 Chronic obstructive pulmonary disease with (acute) exacerbation: Secondary | ICD-10-CM | POA: Insufficient documentation

## 2013-08-10 DIAGNOSIS — I4891 Unspecified atrial fibrillation: Secondary | ICD-10-CM | POA: Insufficient documentation

## 2013-08-10 DIAGNOSIS — I10 Essential (primary) hypertension: Secondary | ICD-10-CM | POA: Insufficient documentation

## 2013-08-10 DIAGNOSIS — Z87442 Personal history of urinary calculi: Secondary | ICD-10-CM | POA: Insufficient documentation

## 2013-08-10 DIAGNOSIS — E7800 Pure hypercholesterolemia, unspecified: Secondary | ICD-10-CM | POA: Insufficient documentation

## 2013-08-10 DIAGNOSIS — Z86718 Personal history of other venous thrombosis and embolism: Secondary | ICD-10-CM | POA: Insufficient documentation

## 2013-08-10 LAB — BASIC METABOLIC PANEL
BUN: 16 mg/dL (ref 6–23)
CO2: 24 mEq/L (ref 19–32)
Calcium: 9.4 mg/dL (ref 8.4–10.5)
Chloride: 103 mEq/L (ref 96–112)
Creatinine, Ser: 1.2 mg/dL (ref 0.50–1.35)
GFR calc Af Amer: 60 mL/min — ABNORMAL LOW (ref >90)
GFR, EST NON AFRICAN AMERICAN: 52 mL/min — AB (ref >90)
Glucose, Bld: 103 mg/dL — ABNORMAL HIGH (ref 70–99)
POTASSIUM: 4.1 meq/L (ref 3.7–5.3)
SODIUM: 140 meq/L (ref 137–147)

## 2013-08-10 LAB — CBC WITH DIFFERENTIAL/PLATELET
Basophils Absolute: 0 10*3/uL (ref 0.0–0.1)
Basophils Relative: 1 % (ref 0–1)
EOS ABS: 0 10*3/uL (ref 0.0–0.7)
Eosinophils Relative: 0 % (ref 0–5)
HCT: 35.3 % — ABNORMAL LOW (ref 39.0–52.0)
Hemoglobin: 11.7 g/dL — ABNORMAL LOW (ref 13.0–17.0)
Lymphocytes Relative: 58 % — ABNORMAL HIGH (ref 12–46)
Lymphs Abs: 1.7 10*3/uL (ref 0.7–4.0)
MCH: 31.9 pg (ref 26.0–34.0)
MCHC: 33.1 g/dL (ref 30.0–36.0)
MCV: 96.2 fL (ref 78.0–100.0)
MONOS PCT: 11 % (ref 3–12)
Monocytes Absolute: 0.3 10*3/uL (ref 0.1–1.0)
NEUTROS PCT: 30 % — AB (ref 43–77)
Neutro Abs: 0.9 10*3/uL — ABNORMAL LOW (ref 1.7–7.7)
Platelets: 149 10*3/uL — ABNORMAL LOW (ref 150–400)
RBC: 3.67 MIL/uL — AB (ref 4.22–5.81)
RDW: 14.8 % (ref 11.5–15.5)
WBC: 2.9 10*3/uL — ABNORMAL LOW (ref 4.0–10.5)

## 2013-08-10 LAB — TROPONIN I: Troponin I: <0.3 ng/mL (ref <0.30)

## 2013-08-10 MED ORDER — FUROSEMIDE 20 MG PO TABS: 20.0000 mg | ORAL_TABLET | Freq: Every day | ORAL | Status: DC

## 2013-08-10 NOTE — ED Provider Notes (Signed)
CSN: [id number]     Arrival date & time 08/10/13  1131 History  This chart was scribed for Nat Christen, MD by Marlowe Kays, ED Scribe. This patient was seen in room APA09/APA09 and the patient's care was started at 11:47 AM.  Chief Complaint  Patient presents with  . Leg Swelling   The history is provided by the patient. No language interpreter was used.   HPI Comments:  Bryce Mcdonald is a 80 y.o. male with h/o prostate cancer, HTN, and COPD who presents to the Emergency Department complaining of leg swelling that started three days ago. Pt states he was seen at [facility] three days ago and was told he had fluid in his lungs. Pt reports mild SOB. He denies CP. He states he is ambulatory with a walker. No fever, chills, dysuria, substernal chest pain.  Nonsmoker.   He walks with a walker.   Past Medical History  Diagnosis Date  . Colon cancer     Status post partial colectomy  . Hypertension   . Hypercholesterolemia   . Blood transfusion   . Coronary artery disease 2011    Non obstructive CAD  . DVT (deep venous thrombosis)   . PE (pulmonary embolism)   . Diabetes mellitus without complication   . A-fib   . Diabetes mellitus, type II   . Ventral hernia   . Right bundle branch block   . Unspecified hypothyroidism   . LVH (left ventricular hypertrophy) 10/10/2012    Ejection fraction 60-65%.  . Bilateral renal cysts   . Calculus of left kidney     non-obstructing  . COPD (chronic obstructive pulmonary disease)    Past Surgical History  Procedure Laterality Date  . Abdominal surgery      x 3  . Cardiac catheterization  2008   History reviewed. No pertinent family history. History  Substance Use Topics  . Smoking status: Former Research scientist (life sciences)  . Smokeless tobacco: Not on file     Comment: quit over 40 years ago  . Alcohol Use: No     Comment: quit over forty years ago    Review of Systems A complete 10 system review of systems was obtained and all systems are negative  except as noted in the HPI and PMH.   Allergies  Codeine  Home Medications   Prior to Admission medications   Medication Sig Start Date End Date Taking? Authorizing Provider  albuterol (PROVENTIL HFA) 108 (90 BASE) MCG/ACT inhaler Inhale 2 puffs into the lungs every 6 (six) hours as needed for wheezing or shortness of breath.   Yes Historical Provider, MD  atorvastatin (LIPITOR) 80 MG tablet Take 80 mg by mouth daily.   Yes Historical Provider, MD  enoxaparin (LOVENOX) 80 MG/0.8ML injection Inject 1 mL (100 mg total) into the skin daily. 07/29/13  Yes Kathie Dike, MD  famotidine (PEPCID) 20 MG tablet Take 1 tablet (20 mg total) by mouth 2 (two) times daily. 12/06/12  Yes Lezlie Octave Black, NP  furosemide (LASIX) 40 MG tablet Take 1 tablet (40 mg total) by mouth every morning. 07/29/13  Yes Kathie Dike, MD  isosorbide mononitrate (IMDUR) 30 MG 24 hr tablet Take 1 tablet (30 mg total) by mouth daily. Long acting nitroglycerin medicine for your heart. 10/11/12  Yes Rexene Alberts, MD  levothyroxine (SYNTHROID, LEVOTHROID) 50 MCG tablet Take 50 mcg by mouth daily before breakfast.   Yes Historical Provider, MD  lisinopril (PRINIVIL,ZESTRIL) 10 MG tablet Take 1 tablet (10 mg total)  by mouth daily. 07/29/13  Yes Kathie Dike, MD  metoprolol tartrate (LOPRESSOR) 25 MG tablet Take 12.5 mg by mouth 2 (two) times daily.   Yes Historical Provider, MD  terazosin (HYTRIN) 1 MG capsule Take 1 mg by mouth at bedtime.   Yes Historical Provider, MD  furosemide (LASIX) 20 MG tablet Take 1 tablet (20 mg total) by mouth daily. 08/10/13   Nat Christen, MD   Triage Vitals: BP 124/68  Pulse 64  Temp(Src) 98.2 F (36.8 C) (Oral)  Resp 18  Ht 5\' 8"  (1.727 m)  Wt 172 lb (78.019 kg)  BMI 26.16 kg/m2  SpO2 97% Physical Exam  Nursing note and vitals reviewed. Constitutional: He is oriented to person, place, and time. He appears well-developed and well-nourished.  Slightly dyspneic.  HENT:  Head: Normocephalic and  atraumatic.  Eyes: Conjunctivae and EOM are normal. Pupils are equal, round, and reactive to light.  Neck: Normal range of motion. Neck supple.  Cardiovascular: Normal rate, regular rhythm and normal heart sounds.   2+ peripheral edema.  Pulmonary/Chest: Effort normal and breath sounds normal. No respiratory distress. He has no wheezes. He has no rales.  Abdominal: Soft. Bowel sounds are normal.  Musculoskeletal: Normal range of motion.  Neurological: He is alert and oriented to person, place, and time.  Skin: Skin is warm and dry.  Psychiatric: He has a normal mood and affect. His behavior is normal.    ED Course  Procedures (including critical care time) DIAGNOSTIC STUDIES: Oxygen Saturation is 97% on RA, normal by my interpretation.   COORDINATION OF CARE: 11:51 AM- Will order CXR and lab work. Pt verbalizes understanding and agrees to plan.  Medications - No data to display  Labs Review Labs Reviewed  BASIC METABOLIC PANEL - Abnormal; Notable for the following:    Glucose, Bld 103 (*)    GFR calc non Af Amer 52 (*)    GFR calc Af Amer 60 (*)    All other components within normal limits  CBC WITH DIFFERENTIAL - Abnormal; Notable for the following:    WBC 2.9 (*)    RBC 3.67 (*)    Hemoglobin 11.7 (*)    HCT 35.3 (*)    Platelets 149 (*)    Neutrophils Relative % 30 (*)    Lymphocytes Relative 58 (*)    Neutro Abs 0.9 (*)    All other components within normal limits  TROPONIN I  PATHOLOGIST SMEAR REVIEW    Imaging Review Dg Chest 2 View  08/10/2013   CLINICAL DATA:  Weakness, shortness of breath, BILATERAL lower extremity swelling, history colon cancer, hypertension, coronary artery disease, DVT, PE, diabetes, COPD  EXAM: CHEST  2 VIEW  COMPARISON:  07/26/2013  FINDINGS: Borderline enlargement of cardiac silhouette.  Atherosclerotic calcification aorta.  Mediastinal contours and pulmonary vascularity normal.  Emphysematous changes with bibasilar scarring greater on  RIGHT.  No acute infiltrate, pleural effusion or pneumothorax.  Bones demineralized.  IMPRESSION: COPD changes with bibasilar scarring greater on RIGHT.  Borderline enlargement of cardiac silhouette.  No acute abnormalities.   Electronically Signed   By: Lavonia Dana M.D.   On: 08/10/2013 12:56     EKG Interpretation   Date/Time:  Sunday August 10 2013 11:55:26 EDT Ventricular Rate:  69 PR Interval:    QRS Duration: 137 QT Interval:  447 QTC Calculation: 479 R Axis:   151 Text Interpretation:  Atrial fibrillation RBBB and LPFB Confirmed by Lacinda Axon   MD, BRIAN ([id number]) on 08/10/2013  12:15:34 PM      MDM   Final diagnoses:  Edema    Patient is in no acute distress. Chest x-ray shows no obvious pulmonary edema.  Will increase Lasix 50% to 60 mg daily. Patient has primary care followup at the [facility]  I personally performed the services described in this documentation, which was scribed in my presence. The recorded information has been reviewed and is accurate.    Nat Christen, MD 08/10/13 [phone]

## 2013-08-10 NOTE — ED Notes (Signed)
Pt was seen on Thursday for leg swelling at [facility]. Pt reports MD at [facility] reported pt "had fluid in his lungs as well." Pt reports was admitted for same several weeks ago. Pt denies any sob, cp. Pt alert and oriented in triage. nad noted.

## 2013-08-10 NOTE — Discharge Instructions (Signed)
Peripheral Edema You have swelling in your legs (peripheral edema). This swelling is due to excess accumulation of salt and water in your body. Edema may be a sign of heart, kidney or liver disease, or a side effect of a medication. It may also be due to problems in the leg veins. Elevating your legs and using special support stockings may be very helpful, if the cause of the swelling is due to poor venous circulation. Avoid long periods of standing, whatever the cause. Treatment of edema depends on identifying the cause. Chips, pretzels, pickles and other salty foods should be avoided. Restricting salt in your diet is almost always needed. Water pills (diuretics) are often used to remove the excess salt and water from your body via urine. These medicines prevent the kidney from reabsorbing sodium. This increases urine flow. Diuretic treatment may also result in lowering of potassium levels in your body. Potassium supplements may be needed if you have to use diuretics daily. Daily weights can help you keep track of your progress in clearing your edema. You should call your caregiver for follow up care as recommended. SEEK IMMEDIATE MEDICAL CARE IF:   You have increased swelling, pain, redness, or heat in your legs.  You develop shortness of breath, especially when lying down.  You develop chest or abdominal pain, weakness, or fainting.  You have a fever. Document Released: 03/16/2004 Document Revised: 05/01/2011 Document Reviewed: 02/24/2009 [facility] Patient Information 2015 [place], [place]. This information is not intended to replace advice given to you by your health care provider. Make sure you discuss any questions you have with your health care provider.   Elevate legs. Support hose.  I've written a prescription for Lasix 20 mg.    By recommendations to take your normal 40 mg tablet in the morning.    Additionally you can take the 20 mg tablet mid day.  Followup your primary care doctor at the  [place].

## 2013-08-11 LAB — PATHOLOGIST SMEAR REVIEW

## 2013-08-19 ENCOUNTER — Encounter: Payer: Self-pay | Admitting: *Deleted

## 2013-08-19 ENCOUNTER — Encounter: Payer: Self-pay | Admitting: Cardiovascular Disease

## 2013-08-19 ENCOUNTER — Ambulatory Visit (INDEPENDENT_AMBULATORY_CARE_PROVIDER_SITE_OTHER): Payer: Medicare Other | Admitting: Cardiovascular Disease

## 2013-08-19 VITALS — BP 110/52 | HR 74 | Ht 68.0 in | Wt 166.0 lb

## 2013-08-19 DIAGNOSIS — I2782 Chronic pulmonary embolism: Secondary | ICD-10-CM

## 2013-08-19 DIAGNOSIS — Z7901 Long term (current) use of anticoagulants: Secondary | ICD-10-CM

## 2013-08-19 DIAGNOSIS — I4891 Unspecified atrial fibrillation: Secondary | ICD-10-CM

## 2013-08-19 DIAGNOSIS — I82409 Acute embolism and thrombosis of unspecified deep veins of unspecified lower extremity: Secondary | ICD-10-CM

## 2013-08-19 DIAGNOSIS — E785 Hyperlipidemia, unspecified: Secondary | ICD-10-CM

## 2013-08-19 DIAGNOSIS — I5022 Chronic systolic (congestive) heart failure: Secondary | ICD-10-CM

## 2013-08-19 DIAGNOSIS — I255 Ischemic cardiomyopathy: Secondary | ICD-10-CM

## 2013-08-19 DIAGNOSIS — I482 Chronic atrial fibrillation, unspecified: Secondary | ICD-10-CM

## 2013-08-19 DIAGNOSIS — I10 Essential (primary) hypertension: Secondary | ICD-10-CM

## 2013-08-19 DIAGNOSIS — I2589 Other forms of chronic ischemic heart disease: Secondary | ICD-10-CM

## 2013-08-19 MED ORDER — RIVAROXABAN 20 MG PO TABS: 20.0000 mg | ORAL_TABLET | Freq: Every day | ORAL | Status: DC

## 2013-08-19 NOTE — Progress Notes (Signed)
Patient ID: Bryce Mcdonald, male   DOB: May 18, 1921, 80 y.o.   MRN: [id number]      SUBJECTIVE: The patients is an 80 year old male who was recently hospitalized for acute on chronic systolic and diastolic heart failure. He also has a history of conduction system disease, atrial fibrillation, DVT, PE, cardiomyopathy with EF 40-50% with mild mitral regurgitation and mild to moderate tricuspid regurgitation, PASP 60 mm mercury. He takes Lovenox for atrial fibrillation and DVT/PE.   He then presented to the ED on June 21 with increasing leg edema and his Lasix was increased from 20 to 40 mg daily. Chest x-ray did not demonstrate pulmonary edema. His weight at that time was 172 pounds and he weighs 166 pounds today.  He is now feeling well and denies chest pain, shortness of breath, palpitations, and leg swelling.  He and his daughter say that they do not see a cardiologist at the [place].  His daughter says he eats a lot of high-sodium foods.  Allergies  Allergen Reactions  . Codeine Shortness Of Breath    Shortness of breath    Current Outpatient Prescriptions  Medication Sig Dispense Refill  . albuterol (PROVENTIL HFA) 108 (90 BASE) MCG/ACT inhaler Inhale 2 puffs into the lungs every 6 (six) hours as needed for wheezing or shortness of breath.      Marland Kitchen atorvastatin (LIPITOR) 80 MG tablet Take 80 mg by mouth daily.      Marland Kitchen enoxaparin (LOVENOX) 80 MG/0.8ML injection Inject 1 mL (100 mg total) into the skin daily.  22.4 Syringe    . famotidine (PEPCID) 20 MG tablet Take 1 tablet (20 mg total) by mouth 2 (two) times daily.      . furosemide (LASIX) 40 MG tablet Take 1 tablet (40 mg total) by mouth every morning.  30 tablet  1  . isosorbide mononitrate (IMDUR) 30 MG 24 hr tablet Take 1 tablet (30 mg total) by mouth daily. Long acting nitroglycerin medicine for your heart.  30 tablet  6  . levothyroxine (SYNTHROID, LEVOTHROID) 50 MCG tablet Take 50 mcg by mouth daily before breakfast.      .  lisinopril (PRINIVIL,ZESTRIL) 10 MG tablet Take 1 tablet (10 mg total) by mouth daily.      . metoprolol tartrate (LOPRESSOR) 25 MG tablet Take 12.5 mg by mouth 2 (two) times daily.      Marland Kitchen terazosin (HYTRIN) 1 MG capsule Take 1 mg by mouth at bedtime.       No current facility-administered medications for this visit.    Past Medical History  Diagnosis Date  . Colon cancer     Status post partial colectomy  . Hypertension   . Hypercholesterolemia   . Blood transfusion   . Coronary artery disease 2011    Non obstructive CAD  . DVT (deep venous thrombosis)   . PE (pulmonary embolism)   . Diabetes mellitus without complication   . A-fib   . Diabetes mellitus, type II   . Ventral hernia   . Right bundle branch block   . Unspecified hypothyroidism   . LVH (left ventricular hypertrophy) 10/10/2012    Ejection fraction 60-65%.  . Bilateral renal cysts   . Calculus of left kidney     non-obstructing  . COPD (chronic obstructive pulmonary disease)     Past Surgical History  Procedure Laterality Date  . Abdominal surgery      x 3  . Cardiac catheterization  2008    History   Social  History  . Marital Status: Widowed    Spouse Name: N/A    Number of Children: 8  . Years of Education: N/A   Occupational History  . Not on file.   Social History Main Topics  . Smoking status: Former Research scientist (life sciences)  . Smokeless tobacco: Not on file     Comment: quit over 40 years ago  . Alcohol Use: No     Comment: quit over forty years ago  . Drug Use: No  . Sexual Activity: No   Other Topics Concern  . Not on file   Social History Narrative  . No narrative on file     Filed Vitals:   08/19/13 0852  BP: 110/52  Pulse: 74  Height: 5\' 8"  (1.727 m)  Weight: 166 lb (75.297 kg)    PHYSICAL EXAM General: NAD Neck: No JVD, no thyromegaly. Lungs: Clear to auscultation bilaterally with normal respiratory effort. CV: Nondisplaced PMI.  Irregular rhythm, normal rate, normal S1/S2, no S3,  no murmur. No pretibial or periankle edema.   Abdomen: Soft, nontender, no hepatosplenomegaly, no distention.  Neurologic: Alert and oriented x 3.  Psych: Normal affect. Extremities: No clubbing or cyanosis.   ECG: reviewed and available in electronic records.      ASSESSMENT AND PLAN: 1. Atrial fibrillation: For feasibility, will discontinue Lovenox and initiate Xarelto 20 mg daily. GFR 60 ml/min on 6/21. Rate is controlled on metoprolol 12.5 mg bid. 2. Chronic systolic heart failure: EF has declined from 60-65% in 09/2012 to 40-50%. I will obtain a Lexiscan Cardiolite to assess for an ischemic etiology. Troponins normal during hospitalization. Continue beta blocker, ACEI, and Imdur. I strongly encouraged a low sodium diet. 3. HTN: Controlled on current therapy which includes metoprolol 12.5 mg twice daily. 4. History of DVT/PE: Initiating Xarelto 20 mg daily. 5. Hyperlipidemia: On Lipitor 80 mg daily.  Dispo: f/u 2-3 months.  Kate Sable, M.D., F.A.C.C.

## 2013-08-19 NOTE — Patient Instructions (Addendum)
Your physician recommends that you schedule a follow-up appointment in: 2 to 3 months with Dr Bronson Ing  Your physician has recommended you make the following change in your medication:  STOP TAKING LOVENOX  START TAKING [place] 20MG  DAILY  Your physician has requested that you have a lexiscan myoview. For further information please visit [email or web address]. Please follow instruction sheet, as given.  Please decrease your sodium intake. We have given you a low sodium plan.

## 2013-08-21 ENCOUNTER — Emergency Department (HOSPITAL_COMMUNITY)
Admission: EM | Admit: 2013-08-21 | Discharge: 2013-08-21 | Disposition: A | Discharge status: Home / Self Care | Payer: Medicare Other | Attending: Emergency Medicine | Admitting: Emergency Medicine

## 2013-08-21 ENCOUNTER — Encounter (HOSPITAL_COMMUNITY): Payer: Self-pay | Admitting: Emergency Medicine

## 2013-08-21 DIAGNOSIS — I4891 Unspecified atrial fibrillation: Secondary | ICD-10-CM | POA: Insufficient documentation

## 2013-08-21 DIAGNOSIS — L600 Ingrowing nail: Secondary | ICD-10-CM

## 2013-08-21 DIAGNOSIS — Q619 Cystic kidney disease, unspecified: Secondary | ICD-10-CM | POA: Insufficient documentation

## 2013-08-21 DIAGNOSIS — I10 Essential (primary) hypertension: Secondary | ICD-10-CM | POA: Insufficient documentation

## 2013-08-21 DIAGNOSIS — Z9889 Other specified postprocedural states: Secondary | ICD-10-CM | POA: Diagnosis not present

## 2013-08-21 DIAGNOSIS — J4489 Other specified chronic obstructive pulmonary disease: Secondary | ICD-10-CM | POA: Insufficient documentation

## 2013-08-21 DIAGNOSIS — Z86718 Personal history of other venous thrombosis and embolism: Secondary | ICD-10-CM | POA: Diagnosis not present

## 2013-08-21 DIAGNOSIS — Z85038 Personal history of other malignant neoplasm of large intestine: Secondary | ICD-10-CM | POA: Diagnosis not present

## 2013-08-21 DIAGNOSIS — J449 Chronic obstructive pulmonary disease, unspecified: Secondary | ICD-10-CM | POA: Diagnosis not present

## 2013-08-21 DIAGNOSIS — Z87891 Personal history of nicotine dependence: Secondary | ICD-10-CM | POA: Diagnosis not present

## 2013-08-21 DIAGNOSIS — E119 Type 2 diabetes mellitus without complications: Secondary | ICD-10-CM | POA: Diagnosis not present

## 2013-08-21 DIAGNOSIS — E039 Hypothyroidism, unspecified: Secondary | ICD-10-CM | POA: Diagnosis not present

## 2013-08-21 DIAGNOSIS — Z87442 Personal history of urinary calculi: Secondary | ICD-10-CM | POA: Diagnosis not present

## 2013-08-21 DIAGNOSIS — Z7901 Long term (current) use of anticoagulants: Secondary | ICD-10-CM | POA: Insufficient documentation

## 2013-08-21 DIAGNOSIS — E7800 Pure hypercholesterolemia, unspecified: Secondary | ICD-10-CM | POA: Insufficient documentation

## 2013-08-21 DIAGNOSIS — Z86711 Personal history of pulmonary embolism: Secondary | ICD-10-CM | POA: Insufficient documentation

## 2013-08-21 DIAGNOSIS — Z4800 Encounter for change or removal of nonsurgical wound dressing: Secondary | ICD-10-CM | POA: Diagnosis present

## 2013-08-21 LAB — CBG MONITORING, ED: Glucose-Capillary: 100 mg/dL — ABNORMAL HIGH (ref 70–99)

## 2013-08-21 MED ORDER — CEPHALEXIN 500 MG PO CAPS: 500.0000 mg | ORAL_CAPSULE | Freq: Two times a day (BID) | ORAL | Status: DC

## 2013-08-21 NOTE — ED Provider Notes (Signed)
CSN: [id number]     Arrival date & time 08/21/13  0800 History  This chart was scribed for Sharyon Cable, MD by Ludger Nutting, ED Scribe. This patient was seen in room APA14/APA14 and the patient's care was started 8:15 AM.    Chief Complaint  Patient presents with  . Wound Check    Patient is a 80 y.o. male presenting with wound check. The history is provided by the patient. No language interpreter was used.  Wound Check This is a new problem. The current episode started more than 1 week ago. The problem has been gradually improving. Nothing aggravates the symptoms. Nothing relieves the symptoms. He has tried nothing for the symptoms.    HPI Comments: Bryce Mcdonald is a 80 y.o. male with past medical history of DM who presents to the Emergency Department requesting a wound check today. Patient states he purposely cut his toes on the right foot with a box cutter 3 weeks ago to improve ingrown toenail. He reports scabbed areas to the toes of the right foot without active bleeding or drainage. He denies fever, vomiting, increased difficulty walking, numbness or paresthesia to the feet. He has no other complaints at this time.   PCP [facility]  Past Medical History  Diagnosis Date  . Colon cancer     Status post partial colectomy  . Hypertension   . Hypercholesterolemia   . Blood transfusion   . Coronary artery disease 2011    Non obstructive CAD  . DVT (deep venous thrombosis)   . PE (pulmonary embolism)   . Diabetes mellitus without complication   . A-fib   . Diabetes mellitus, type II   . Ventral hernia   . Right bundle branch block   . Unspecified hypothyroidism   . LVH (left ventricular hypertrophy) 10/10/2012    Ejection fraction 60-65%.  . Bilateral renal cysts   . Calculus of left kidney     non-obstructing  . COPD (chronic obstructive pulmonary disease)    Past Surgical History  Procedure Laterality Date  . Abdominal surgery      x 3  . Cardiac catheterization   2008   History reviewed. No pertinent family history. History  Substance Use Topics  . Smoking status: Former Smoker    Types: Cigarettes  . Smokeless tobacco: Never Used     Comment: quit over 40 years ago  . Alcohol Use: No     Comment: quit over forty years ago    Review of Systems  Constitutional: Negative for fever.  Gastrointestinal: Negative for vomiting.  Skin: Positive for wound (toes on right foot ).  Neurological: Negative for numbness.      Allergies  Codeine  Home Medications   Prior to Admission medications   Medication Sig Start Date End Date Taking? Authorizing Provider  albuterol (PROVENTIL HFA) 108 (90 BASE) MCG/ACT inhaler Inhale 2 puffs into the lungs every 6 (six) hours as needed for wheezing or shortness of breath.    Historical Provider, MD  atorvastatin (LIPITOR) 80 MG tablet Take 80 mg by mouth daily.    Historical Provider, MD  famotidine (PEPCID) 20 MG tablet Take 1 tablet (20 mg total) by mouth 2 (two) times daily. 12/06/12   Radene Gunning, NP  furosemide (LASIX) 40 MG tablet Take 1 tablet (40 mg total) by mouth every morning. 07/29/13   Kathie Dike, MD  isosorbide mononitrate (IMDUR) 30 MG 24 hr tablet Take 1 tablet (30 mg total) by mouth daily.  Long acting nitroglycerin medicine for your heart. 10/11/12   Rexene Alberts, MD  levothyroxine (SYNTHROID, LEVOTHROID) 50 MCG tablet Take 50 mcg by mouth daily before breakfast.    Historical Provider, MD  lisinopril (PRINIVIL,ZESTRIL) 10 MG tablet Take 1 tablet (10 mg total) by mouth daily. 07/29/13   Kathie Dike, MD  metoprolol tartrate (LOPRESSOR) 25 MG tablet Take 12.5 mg by mouth 2 (two) times daily.    Historical Provider, MD  rivaroxaban (XARELTO) 20 MG TABS tablet Take 1 tablet (20 mg total) by mouth daily with supper. 08/19/13   Herminio Commons, MD  terazosin (HYTRIN) 1 MG capsule Take 1 mg by mouth at bedtime.    Historical Provider, MD   BP 106/83  Pulse 78  Temp(Src) 97.8 F (36.6 C)  (Oral)  Resp 24  Ht 5\' 8"  (1.727 m)  Wt 166 lb (75.297 kg)  BMI 25.25 kg/m2  SpO2 96% Physical Exam  Nursing note and vitals reviewed.   CONSTITUTIONAL: Well developed/well nourished HEAD: Normocephalic/atraumatic ENMT: Mucous membranes moist NECK: supple no meningeal signs CV: S1/S2 noted, no murmurs/rubs/gallops noted LUNGS: Lungs are clear to auscultation bilaterally, no apparent distress ABDOMEN: soft, nontender, no rebound or guarding NEURO: Pt is awake/alert, moves all extremitiesx4 EXTREMITIES: multiple ingrown toenails on right foot without drainage, crepitus, or tenderness. No edema to lower extremities. Foot is warm to touch.   SKIN: warm, color normal PSYCH: no abnormalities of mood noted  ED Course  Procedures  DIAGNOSTIC STUDIES: Oxygen Saturation is 95% on RA, adequate by my interpretation.    COORDINATION OF CARE: 8:20 AM Discussed treatment plan with pt at bedside and pt agreed to plan.  i feel he is safe for d/c and f/u with podiatry for full evaluation/management.  No signs of diabetic foot and I feel with warm soaks/keflex he is safe for f/u as outpatient.   Labs Review Labs Reviewed  CBG MONITORING, ED - Abnormal; Notable for the following:    Glucose-Capillary 100 (*)    All other components within normal limits     MDM   Final diagnoses:  Ingrown toenail without infection    Nursing notes including past medical history and social history reviewed and considered in documentation Labs/vital reviewed and considered   I personally performed the services described in this documentation, which was scribed in my presence. The recorded information has been reviewed and is accurate.      Sharyon Cable, MD 08/21/13 [phone]

## 2013-08-21 NOTE — ED Notes (Signed)
Patient c/o wound to right great toe and right 3rd toe after cutting toe nails with box cutters. Scabbed areas noted. No active drainage or bleeding noted. No odor noted. Patient also c/o left great toe nail "growing into skin. Patient diabetic.  Denies any fevers.

## 2013-08-21 NOTE — Discharge Instructions (Signed)
Ingrown Toenail An ingrown toenail occurs when the sharp edge of your toenail grows into the skin. Causes of ingrown toenails include toenails clipped too far back or poorly fitting shoes. Activities involving sudden stops (basketball, tennis) causing "toe jamming" may lead to an ingrown nail. HOME CARE INSTRUCTIONS   Soak the whole foot in warm soapy water for 20 minutes, 3 times per day.  You may lift the edge of the nail away from the sore skin by wedging a small piece of cotton under the corner of the nail. Be careful not to dig (traumatize) and cause more injury to the area.  Wear shoes that fit well. While the ingrown nail is causing problems, sandals may be beneficial.  Trim your toenails regularly and carefully. Cut your toenails straight across, not in a curve. This will prevent injury to the skin at the corners of the toenail.  Keep your feet clean and dry.  Crutches may be helpful early in treatment if walking is painful.  Antibiotics, if prescribed, should be taken as directed.  Return for a wound check in 2 days or as directed.  Only take over-the-counter or prescription medicines for pain, discomfort, or fever as directed by your caregiver. SEEK IMMEDIATE MEDICAL CARE IF:   You have a fever.  You have increasing pain, redness, swelling, or heat at the wound site.  Your toe is not better in 7 days. If conservative treatment is not successful, surgical removal of a portion or all of the nail may be necessary. MAKE SURE YOU:   Understand these instructions.  Will watch your condition.  Will get help right away if you are not doing well or get worse. Document Released: 02/04/2000 Document Revised: 05/01/2011 Document Reviewed: 01/29/2008 [employer] Patient Information 2015 [employer]. This information is not intended to replace advice given to you by your health care provider. Make sure you discuss any questions you have with your health care provider.  

## 2013-08-25 ENCOUNTER — Other Ambulatory Visit: Payer: Self-pay | Admitting: *Deleted

## 2013-08-25 DIAGNOSIS — I429 Cardiomyopathy, unspecified: Secondary | ICD-10-CM

## 2013-08-27 ENCOUNTER — Encounter (HOSPITAL_COMMUNITY): Payer: Self-pay

## 2013-08-27 ENCOUNTER — Encounter (HOSPITAL_COMMUNITY)
Admission: RE | Admit: 2013-08-27 | Discharge: 2013-08-27 | Disposition: A | Discharge status: Home / Self Care | Payer: Medicare Other | Source: Ambulatory Visit | Attending: Cardiovascular Disease | Admitting: Cardiovascular Disease

## 2013-08-27 ENCOUNTER — Ambulatory Visit (HOSPITAL_COMMUNITY)
Admission: RE | Admit: 2013-08-27 | Discharge: 2013-08-27 | Disposition: A | Discharge status: Home / Self Care | Payer: Non-veteran care | Source: Ambulatory Visit | Attending: Cardiovascular Disease | Admitting: Cardiovascular Disease

## 2013-08-27 DIAGNOSIS — I428 Other cardiomyopathies: Secondary | ICD-10-CM | POA: Insufficient documentation

## 2013-08-27 DIAGNOSIS — R079 Chest pain, unspecified: Secondary | ICD-10-CM | POA: Diagnosis not present

## 2013-08-27 DIAGNOSIS — I429 Cardiomyopathy, unspecified: Secondary | ICD-10-CM

## 2013-08-27 DIAGNOSIS — I4891 Unspecified atrial fibrillation: Secondary | ICD-10-CM | POA: Insufficient documentation

## 2013-08-27 DIAGNOSIS — I509 Heart failure, unspecified: Secondary | ICD-10-CM | POA: Insufficient documentation

## 2013-08-27 MED ORDER — REGADENOSON 0.4 MG/5ML IV SOLN
INTRAVENOUS | Status: AC
  Administered 2013-08-27: 0.4 mg via INTRAVENOUS
  Filled 2013-08-27: qty 5

## 2013-08-27 MED ORDER — REGADENOSON 0.4 MG/5ML IV SOLN
0.4000 mg | Freq: Once | INTRAVENOUS | Status: AC | PRN
  Administered 2013-08-27: 0.4 mg via INTRAVENOUS

## 2013-08-27 MED ORDER — SODIUM CHLORIDE 0.9 % IJ SOLN
10.0000 mL | INTRAMUSCULAR | Status: DC | PRN
  Administered 2013-08-27: 10 mL via INTRAVENOUS

## 2013-08-27 MED ORDER — SODIUM CHLORIDE 0.9 % IJ SOLN
INTRAMUSCULAR | Status: AC
  Administered 2013-08-27: 10 mL via INTRAVENOUS
  Filled 2013-08-27: qty 10

## 2013-08-27 MED ORDER — TECHNETIUM TC 99M SESTAMIBI GENERIC - CARDIOLITE
30.0000 | Freq: Once | INTRAVENOUS | Status: AC | PRN
  Administered 2013-08-27: 30 via INTRAVENOUS

## 2013-08-27 MED ORDER — TECHNETIUM TC 99M SESTAMIBI - CARDIOLITE
10.0000 | Freq: Once | INTRAVENOUS | Status: AC | PRN
  Administered 2013-08-27: 10 via INTRAVENOUS

## 2013-08-27 NOTE — Progress Notes (Signed)
Stress Lab Nurses Notes - Bryce Mcdonald  Bryce Mcdonald 08/27/2013 Reason for doing test: Afib & CHF Type of test: Bryce Mcdonald Nurse performing test: Gerrit Halls, RN Nuclear Medicine Tech: Dyanne Carrel Echo Tech: Not Applicable MD performing test: Branch/K.Lawrence NP Family MD:  Test explained and consent signed: Yes.   IV started: 22g jelco, Saline lock flushed, No redness or edema and Saline lock started in radiology Symptoms: SOB Treatment/Intervention: None Reason test stopped: protocol completed After recovery IV was: Discontinued via X-ray tech and No redness or edema Patient to return to Nuc. Med at :12:15 Patient discharged: Home Patient's Condition upon discharge was: stable Comments: During test BP 95/49 & HR 89.  Recovery BP 108/60 & HR 78.  Symptoms resolved in recovery. Geanie Cooley T

## 2013-08-29 ENCOUNTER — Encounter: Payer: Self-pay | Admitting: Adult Health

## 2013-08-29 ENCOUNTER — Ambulatory Visit (INDEPENDENT_AMBULATORY_CARE_PROVIDER_SITE_OTHER): Payer: Non-veteran care | Admitting: Adult Health

## 2013-08-29 VITALS — BP 112/50 | HR 42 | Ht 68.0 in | Wt 174.0 lb

## 2013-08-29 DIAGNOSIS — I5033 Acute on chronic diastolic (congestive) heart failure: Secondary | ICD-10-CM

## 2013-08-29 DIAGNOSIS — I509 Heart failure, unspecified: Secondary | ICD-10-CM

## 2013-08-29 DIAGNOSIS — R55 Syncope and collapse: Secondary | ICD-10-CM

## 2013-08-29 DIAGNOSIS — R197 Diarrhea, unspecified: Secondary | ICD-10-CM

## 2013-08-29 DIAGNOSIS — I4891 Unspecified atrial fibrillation: Secondary | ICD-10-CM

## 2013-08-29 MED ORDER — FUROSEMIDE 20 MG PO TABS
20.0000 mg | ORAL_TABLET | Freq: Every morning | ORAL | Status: DC
Start: 2013-08-29 — End: 2013-09-09

## 2013-08-29 MED ORDER — FUROSEMIDE 20 MG PO TABS: 20.0000 mg | ORAL_TABLET | Freq: Every morning | ORAL | Status: DC

## 2013-08-29 NOTE — Progress Notes (Signed)
HPI: [name] is an 80 year old patient of Dr. Pennelope Mcdonald that we follow for ongoing assessment and management chronic diastolic and systolic heart failure, history of construction system disease, atrial fibrillation, history DVT PE, cardiomyopathy with an EF of 40-50% with mild mitral regurg and mild to moderate tricuspid regurg. He is on Lovenox for atrial fibrillation and DVT.  He is here today because family members state he is passing out daily. He has a NM stress test 08/27/2013:  Low to intermediate risk Lexiscan Cardiolite. There were no  diagnostic ST segment abnormalities, atrial fibrillation present  throughout. Perfusion imaging is most consistent with scar in the  inferior wall without any large ischemic territories. LVEF is  calculated at 37% with fairly diffuse hypokinesis and normal  volumes.  Echo completed  On 07/27/2013 EF of 40%-50% with diffuse hypokineses. MV was calcified, RV systolic function is mildly reduced, PA pressure elevated at 60 mm/Hg.  His symptoms include significant and severe dizziness it first thing in the morning. He states this happens every morning after he gets out of bed walks into the bathroom to urinate. He states he almost falls every morning and has to catch himself due to significant dizziness. He states on occasion while sitting he will notice waves of dizziness occur as well. He said he was reading the Bible turned his head to one side as a fly had come around his head and when he did so he felt so dizzy that he will almost might pass out.  He also is having frequent abdominal pain and diarrhea. He is followed by the [facility] in his head the treatment for this. He has had a history of prostate cancer and colon cancer for which she has had surgical removal.  He denies chest pain or palpitations or significant dyspnea associated with the dizziness and near syncope.  Allergies  Allergen Reactions  . Codeine Shortness Of Breath    Shortness of breath     Current Outpatient Prescriptions  Medication Sig Dispense Refill  . albuterol (PROVENTIL HFA) 108 (90 BASE) MCG/ACT inhaler Inhale 2 puffs into the lungs every 6 (six) hours as needed for wheezing or shortness of breath.      Marland Kitchen atorvastatin (LIPITOR) 80 MG tablet Take 80 mg by mouth daily.      . famotidine (PEPCID) 20 MG tablet Take 1 tablet (20 mg total) by mouth 2 (two) times daily.      . furosemide (LASIX) 20 MG tablet Take 1 tablet (20 mg total) by mouth every morning.  90 tablet  3  . levothyroxine (SYNTHROID, LEVOTHROID) 50 MCG tablet Take 50 mcg by mouth daily before breakfast.      . lisinopril (PRINIVIL,ZESTRIL) 10 MG tablet Take 1 tablet (10 mg total) by mouth daily.      . metoprolol tartrate (LOPRESSOR) 25 MG tablet Take 12.5 mg by mouth 2 (two) times daily.      . rivaroxaban (XARELTO) 20 MG TABS tablet Take 1 tablet (20 mg total) by mouth daily with supper.  30 tablet  3   No current facility-administered medications for this visit.    Past Medical History  Diagnosis Date  . Colon cancer     Status post partial colectomy  . Hypertension   . Hypercholesterolemia   . Blood transfusion   . Coronary artery disease 2011    Non obstructive CAD  . DVT (deep venous thrombosis)   . PE (pulmonary embolism)   . Diabetes mellitus without complication   .  A-fib   . Diabetes mellitus, type II   . Ventral hernia   . Right bundle branch block   . Unspecified hypothyroidism   . LVH (left ventricular hypertrophy) 10/10/2012    Ejection fraction 60-65%.  . Bilateral renal cysts   . Calculus of left kidney     non-obstructing  . COPD (chronic obstructive pulmonary disease)     Past Surgical History  Procedure Laterality Date  . Abdominal surgery      x 3  . Cardiac catheterization  2008    UVO:[id number] of systems complete and found to be negative unless listed above  PHYSICAL EXAM BP 112/50  Pulse 42  Ht 5\' 8"  (1.727 m)  Wt 174 lb (78.926 kg)  BMI 26.46  kg/m2 General: Well developed, well nourished, in no acute distress Head: Eyes PERRLA, No xanthomas.   Normal cephalic and atramatic  Lungs: Some crackles in the bases no wheezes or cough. Heart: HIRR S1 S2, without MRG.  Pulses are 2+ & equal.            No carotid bruit. No JVD.  No abdominal bruits. No femoral bruits. Abdomen: Bowel sounds are positive, abdomen soft and non-tender without masses or                  Hernia's noted. Msk:  Back normal, normal gait. Normal strength and tone for age. Extremities: No clubbing, cyanosis or edema.  DP +1 Neuro: Alert and oriented X 3. Psych:  Good affect, responds appropriately   EKG:  Atrial fibrillation, heart rate 76 beats per minute with a right bundle branch block  ASSESSMENT AND PLAN

## 2013-08-29 NOTE — Patient Instructions (Signed)
Your physician recommends that you schedule a follow-up appointment in: 1 week   Your physician has recommended you make the following change in your medication:    STOP  Imdur  STOP Hytrin   DECREASE Lasix to 20 mg daily   Please get blood work and stool culture (HgA1C,CBC,CMET,Magnesium,Stool for C-dif )         Thank you for choosing [facility] !

## 2013-08-29 NOTE — Assessment & Plan Note (Signed)
He will have a stool sample checked to evaluate for C. difficile.

## 2013-08-29 NOTE — Assessment & Plan Note (Signed)
CBC is completed today. He denies any bleeding with use of Xarelto, but we will check nonetheless due to his significant orthostatic hypotension and dizziness

## 2013-08-29 NOTE — Progress Notes (Deleted)
Name: Bryce Mcdonald    DOB: 10/30/1921  Age: 80 y.o.  MR#: [id number]       PCP:  PROVIDER NOT IN SYSTEM      Insurance: Payor: [employer] / Plan: [employer] / Product Type: *No Product type* /   CC:    Chief Complaint  Patient presents with  . Atrial Fibrillation  . Congestive Heart Failure    VS Filed Vitals:   08/29/13 1518  BP: 112/50  Pulse: 42  Height: 5\' 8"  (1.727 m)  Weight: 174 lb (78.926 kg)    Weights Current Weight  08/29/13 174 lb (78.926 kg)  08/21/13 166 lb (75.297 kg)  08/19/13 166 lb (75.297 kg)    Blood Pressure  BP Readings from Last 3 Encounters:  08/29/13 112/50  08/21/13 106/83  08/19/13 110/52     Admit date:  (Not on file) Last encounter with RMR:  Visit date not found   Allergy Codeine  Current Outpatient Prescriptions  Medication Sig Dispense Refill  . albuterol (PROVENTIL HFA) 108 (90 BASE) MCG/ACT inhaler Inhale 2 puffs into the lungs every 6 (six) hours as needed for wheezing or shortness of breath.      Marland Kitchen atorvastatin (LIPITOR) 80 MG tablet Take 80 mg by mouth daily.      . famotidine (PEPCID) 20 MG tablet Take 1 tablet (20 mg total) by mouth 2 (two) times daily.      . furosemide (LASIX) 40 MG tablet Take 1 tablet (40 mg total) by mouth every morning.  30 tablet  1  . isosorbide mononitrate (IMDUR) 30 MG 24 hr tablet Take 1 tablet (30 mg total) by mouth daily. Long acting nitroglycerin medicine for your heart.  30 tablet  6  . levothyroxine (SYNTHROID, LEVOTHROID) 50 MCG tablet Take 50 mcg by mouth daily before breakfast.      . lisinopril (PRINIVIL,ZESTRIL) 10 MG tablet Take 1 tablet (10 mg total) by mouth daily.      . metoprolol tartrate (LOPRESSOR) 25 MG tablet Take 12.5 mg by mouth 2 (two) times daily.      . rivaroxaban (XARELTO) 20 MG TABS tablet Take 1 tablet (20 mg total) by mouth daily with supper.  30 tablet  3  . terazosin (HYTRIN) 1 MG capsule Take 1 mg by mouth at bedtime.       No current  facility-administered medications for this visit.    Discontinued Meds:    Medications Discontinued During This Encounter  Medication Reason  . cephALEXin (KEFLEX) 500 MG capsule Error    Patient Active Problem List   Diagnosis Date Noted  . Dyspnea 07/26/2013  . Acute respiratory failure 07/26/2013  . Acute on chronic diastolic CHF (congestive heart failure) 07/26/2013  . Malnutrition of moderate degree 12/06/2012  . Diarrhea 12/06/2012  . Calculus of left kidney 12/05/2012  . Bilateral renal cysts 12/05/2012  . Chronic diastolic congestive heart failure 12/04/2012  . Acute renal failure 12/04/2012  . Nausea alone 12/04/2012  . History of colon cancer 12/04/2012  . Abdominal pain, left lower quadrant 12/04/2012  . Heart failure 11/11/2012  . LVH (left ventricular hypertrophy) 10/11/2012  . DVT, bilateral lower limbs 10/10/2012  . Dysphagia 10/09/2012  . History of pulmonary embolism 10/09/2012  . Chronic anticoagulation 10/09/2012  . Leukopenia 10/09/2012  . Type 2 diabetes mellitus 10/09/2012  . Right bundle branch block 10/09/2012  . Unspecified hypothyroidism 10/09/2012  . Anemia 10/09/2012  . Atrial fibrillation with slow ventricular response 09/30/2012  . UTI (  lower urinary tract infection) 10/16/2010  . HTN (hypertension) 10/16/2010  . Prostate cancer 10/16/2010  . Hyperlipidemia 10/16/2010  . Ventral hernia 10/16/2010    LABS    Component Value Date/Time   NA 140 08/10/2013 1156   NA 142 07/29/2013 0634   NA 143 07/28/2013 0712   K 4.1 08/10/2013 1156   K 3.5* 07/29/2013 0634   K 3.7 07/28/2013 0712   CL 103 08/10/2013 1156   CL 99 07/29/2013 0634   CL 100 07/28/2013 0712   CO2 24 08/10/2013 1156   CO2 31 07/29/2013 0634   CO2 30 07/28/2013 0712   GLUCOSE 103* 08/10/2013 1156   GLUCOSE 110* 07/29/2013 0634   GLUCOSE 107* 07/28/2013 0712   BUN 16 08/10/2013 1156   BUN 26* 07/29/2013 0634   BUN 28* 07/28/2013 0712   CREATININE 1.20 08/10/2013 1156   CREATININE 1.18 07/29/2013  0634   CREATININE 1.19 07/28/2013 0712   CALCIUM 9.4 08/10/2013 1156   CALCIUM 9.5 07/29/2013 0634   CALCIUM 9.4 07/28/2013 0712   GFRNONAA 52* 08/10/2013 1156   GFRNONAA 53* 07/29/2013 0634   GFRNONAA 52* 07/28/2013 0712   GFRAA 60* 08/10/2013 1156   GFRAA 61* 07/29/2013 0634   GFRAA 61* 07/28/2013 0712   CMP     Component Value Date/Time   NA 140 08/10/2013 1156   K 4.1 08/10/2013 1156   CL 103 08/10/2013 1156   CO2 24 08/10/2013 1156   GLUCOSE 103* 08/10/2013 1156   BUN 16 08/10/2013 1156   CREATININE 1.20 08/10/2013 1156   CALCIUM 9.4 08/10/2013 1156   PROT 7.1 04/23/2013 1258   ALBUMIN 3.5 04/23/2013 1258   AST 11 04/23/2013 1258   ALT 7 04/23/2013 1258   ALKPHOS 84 04/23/2013 1258   BILITOT 0.3 04/23/2013 1258   GFRNONAA 52* 08/10/2013 1156   GFRAA 60* 08/10/2013 1156       Component Value Date/Time   WBC 2.9* 08/10/2013 1156   WBC 3.0* 07/29/2013 0634   WBC 3.1* 07/28/2013 0712   HGB 11.7* 08/10/2013 1156   HGB 12.0* 07/29/2013 0634   HGB 10.7* 07/28/2013 0712   HCT 35.3* 08/10/2013 1156   HCT 36.6* 07/29/2013 0634   HCT 33.3* 07/28/2013 0712   MCV 96.2 08/10/2013 1156   MCV 94.6 07/29/2013 0634   MCV 96.2 07/28/2013 0712    Lipid Panel     Component Value Date/Time   CHOL 106 12/04/2012 0930   TRIG 120 12/04/2012 0930   HDL 27* 12/04/2012 0930   CHOLHDL 3.9 12/04/2012 0930   VLDL 24 12/04/2012 0930   LDLCALC 55 12/04/2012 0930    ABG    Component Value Date/Time   PHART 7.407 12/29/2012 1055   PCO2ART 36.9 12/29/2012 1055   PO2ART 79.3* 12/29/2012 1055   HCO3 22.8 12/29/2012 1055   TCO2 21.1 12/29/2012 1055   ACIDBASEDEF 1.3 12/29/2012 1055   O2SAT 93.9 12/29/2012 1055     Lab Results  Component Value Date   TSH 4.410 07/26/2013   BNP (last 3 results)  Recent Labs  11/10/12 2136 06/26/13 0401 07/26/13 1128  PROBNP 1765.0* 1461.0* 2036.0*   Cardiac Panel (last 3 results) No results found for this basename: CKTOTAL, CKMB, TROPONINI, RELINDX,  in the last 72 hours  Iron/TIBC/Ferritin/ %Sat     Component Value Date/Time   IRON 71 10/10/2012 0224   TIBC 239 10/10/2012 0224   FERRITIN 313 10/10/2012 0224   IRONPCTSAT 30 10/10/2012 0224     EKG  Orders placed in visit on 08/29/13  . EKG 12-LEAD     Prior Assessment and Plan Problem List as of 08/29/2013     Cardiovascular and Mediastinum   HTN (hypertension)   Last Assessment & Plan   10/25/2012 Office Visit Written 10/25/2012  1:40 PM by Lendon Colonel, NP     Blood pressure is well controlled under current medication regimen. No changes in current regimen. We will see him in 6 months.    Atrial fibrillation with slow ventricular response   Right bundle branch block   DVT, bilateral lower limbs   LVH (left ventricular hypertrophy)   Heart failure   Chronic diastolic congestive heart failure   Acute on chronic diastolic CHF (congestive heart failure)     Respiratory   Acute respiratory failure     Digestive   Dysphagia     Endocrine   Type 2 diabetes mellitus   Unspecified hypothyroidism     Genitourinary   Prostate cancer   UTI (lower urinary tract infection)   Acute renal failure   Calculus of left kidney   Bilateral renal cysts     Other   Hyperlipidemia   Last Assessment & Plan   10/25/2012 Office Visit Written 10/25/2012  1:42 PM by Lendon Colonel, NP     He continues on simvastatin as directed. He is followed by PCP for ongoing labs.    Ventral hernia   History of pulmonary embolism   Chronic anticoagulation   Last Assessment & Plan   10/25/2012 Office Visit Written 10/25/2012  1:44 PM by Lendon Colonel, NP     Remains on LMWH as directed for PE and DVT. He is medically compliant.    Leukopenia   Anemia   Nausea alone   History of colon cancer   Abdominal pain, left lower quadrant   Malnutrition of moderate degree   Diarrhea   Dyspnea       Imaging: Dg Chest 2 View  08/10/2013   CLINICAL DATA:  Weakness, shortness of breath, BILATERAL lower extremity swelling, history colon cancer,  hypertension, coronary artery disease, DVT, PE, diabetes, COPD  EXAM: CHEST  2 VIEW  COMPARISON:  07/26/2013  FINDINGS: Borderline enlargement of cardiac silhouette.  Atherosclerotic calcification aorta.  Mediastinal contours and pulmonary vascularity normal.  Emphysematous changes with bibasilar scarring greater on RIGHT.  No acute infiltrate, pleural effusion or pneumothorax.  Bones demineralized.  IMPRESSION: COPD changes with bibasilar scarring greater on RIGHT.  Borderline enlargement of cardiac silhouette.  No acute abnormalities.   Electronically Signed   By: Lavonia Dana M.D.   On: 08/10/2013 12:56   Nm Myocar Single W/spect W/wall Motion And Ef  08/27/2013   CLINICAL DATA:  80 year old male with history of atrial fibrillation, CAD, hypertension, pulmonary embolus, and cardiomyopathy with LVEF 40-50%. This study is requested to evaluate for the presence and extent of ischemia.  EXAM: MYOCARDIAL IMAGING WITH SPECT (REST AND PHARMACOLOGIC-STRESS)  GATED LEFT VENTRICULAR WALL MOTION STUDY  LEFT VENTRICULAR EJECTION FRACTION  TECHNIQUE: Standard myocardial SPECT imaging was performed after resting intravenous injection of 10 mCi Tc-48m sestamibi. Subsequently, intravenous infusion of Lexiscan was performed under the supervision of the Cardiology staff. At peak effect of the drug, 30 mCi Tc-23m sestamibi was injected intravenously and standard myocardial SPECT imaging was performed. Quantitative gated imaging was also performed to evaluate left ventricular wall motion, and estimate left ventricular ejection fraction.  FINDINGS: Baseline tracing shows atrial fibrillation at 68 beats per min with  right bundle branch block and left anterior fascicular block. Lexiscan bolus was given in standard fashion. Heart rate increased from 67 beats per min up to 91 beats per min, and blood pressure remained stable at 117/70. No chest pain was reported. There were no diagnostic ST segment abnormalities.  Analysis of the  overall perfusion data shows adequate radiotracer uptake with diaphragmatic attenuation.  Tomographic views were obtained using the short axis, vertical long axis, and horizontal long axis planes. There is a moderate size, severe intensity, inferior wall defect extending from apex to base. This region is fixed and suggestive of scar, no large ischemic zones are noted.  Gated imaging reveals an EDV of 82, ESV of 51, LVEF of 37%, and TID ratio 1.05. There is diffuse hypokinesis  IMPRESSION: Low to intermediate risk Lexiscan Cardiolite. There were no diagnostic ST segment abnormalities, atrial fibrillation present throughout. Perfusion imaging is most consistent with scar in the inferior wall without any large ischemic territories. LVEF is calculated at 37% with fairly diffuse hypokinesis and normal volumes.   Electronically Signed   By: Rozann Lesches M.D.   On: 08/27/2013 15:11

## 2013-08-29 NOTE — Assessment & Plan Note (Signed)
He has not appear to be in fluid overload at this time. In fact some kind of concerned about dehydration with history of frequent diarrhea. He is also on 40 mg of Lasix daily and as stated will decrease it to 20 mg daily. I have reviewed his most recent echocardiogram which was completed in June of 2015 revealing an EF of 40-50%. He may be in too much medication, and they have been adjusted as described above, with further adjustments as necessary based upon his symptoms.

## 2013-08-29 NOTE — Assessment & Plan Note (Signed)
Hemoglobin A1c will be checked.

## 2013-08-29 NOTE — Assessment & Plan Note (Signed)
The patient is having significant symptoms of dizziness especially in the morning, but occasionally during the day when turning his head to one side. Carotid exam is negative. I have done orthostatic blood pressures here in the office. Lying 122/62 with pulse of 73 sitting 100/62 with a pulse of 53, standing 88/58 with a pulse of 47.  He is significantly orthostatic. Home if medication adjustments as follows:  1. Decrease Lasix to 20 mg daily (from 40 mg daily)D 2. Discontinue Hytrin 3. Discontinue isosorbide mono  I will have labs completed CMET, CBC. I will see him again in one week. May need to consider decreasing his lisinopril.as well if he continues to remain hypotensive, symptomatically.

## 2013-08-29 NOTE — Assessment & Plan Note (Signed)
Continues on Xarelto, denies any bleeding. Heart rate is well-controlled but does drop significantly during orthostatic blood pressures pulse 73 down to 47 beats per minute. He will continue him on metoprolol for now in hopes that once the other medication adjustments are made he will no longer have issues with dizziness.

## 2013-08-30 LAB — CBC
HEMATOCRIT: 39.9 % (ref 39.0–52.0)
HEMOGLOBIN: 13.4 g/dL (ref 13.0–17.0)
MCH: 30.5 pg (ref 26.0–34.0)
MCHC: 33.6 g/dL (ref 30.0–36.0)
MCV: 90.9 fL (ref 78.0–100.0)
Platelets: 155 10*3/uL (ref 150–400)
RBC: 4.39 MIL/uL (ref 4.22–5.81)
RDW: 15 % (ref 11.5–15.5)
WBC: 3 10*3/uL — ABNORMAL LOW (ref 4.0–10.5)

## 2013-08-30 LAB — COMPREHENSIVE METABOLIC PANEL
ALT: 14 U/L (ref 0–53)
AST: 17 U/L (ref 0–37)
Albumin: 4.6 g/dL (ref 3.5–5.2)
Alkaline Phosphatase: 93 U/L (ref 39–117)
BUN: 25 mg/dL — AB (ref 6–23)
CHLORIDE: 99 meq/L (ref 96–112)
CO2: 27 mEq/L (ref 19–32)
CREATININE: 1.37 mg/dL — AB (ref 0.50–1.35)
Calcium: 10.1 mg/dL (ref 8.4–10.5)
Glucose, Bld: 100 mg/dL — ABNORMAL HIGH (ref 70–99)
Potassium: 4.4 mEq/L (ref 3.5–5.3)
Sodium: 138 mEq/L (ref 135–145)
Total Bilirubin: 0.5 mg/dL (ref 0.2–1.2)
Total Protein: 7.4 g/dL (ref 6.0–8.3)

## 2013-08-30 LAB — HEMOGLOBIN A1C
Hgb A1c MFr Bld: 6.4 % — ABNORMAL HIGH (ref <5.7)
MEAN PLASMA GLUCOSE: 137 mg/dL — AB (ref <117)

## 2013-08-30 LAB — MAGNESIUM: Magnesium: 1.9 mg/dL (ref 1.5–2.5)

## 2013-09-01 ENCOUNTER — Other Ambulatory Visit: Payer: Self-pay | Admitting: Adult Health

## 2013-09-02 ENCOUNTER — Telehealth: Payer: Self-pay

## 2013-09-02 LAB — C. DIFFICILE GDH AND TOXIN A/B
C. DIFF TOXIN A/B: NOT DETECTED
C. difficile GDH: NOT DETECTED

## 2013-09-02 NOTE — Telephone Encounter (Signed)
Daughter Andee Poles took car yesterday and her brother is staying with her dad.

## 2013-09-02 NOTE — Telephone Encounter (Signed)
LMTCB with sister

## 2013-09-02 NOTE — Telephone Encounter (Signed)
It appears that he is having some altered mental status probably related to dementia. Labs have not confirmed infection or issues with renal failure. I will refer him to his primary care physician for further evaluation, he should not drive at all. He has had multiple medication changes to avoid orthostatic hypotension on last office visit.  He is due to see [place] this week for followup. If symptoms persist,however,anf he is having worsening issues with dementia, or on behavior, he may need to be seen by primary care physician first. Would not leave him alone.Marland Kitchen He will need to have a family member/friend with him at all times.. Especially at night to prevent roaming should he get up and decided to go outside or drive.

## 2013-09-02 NOTE — Telephone Encounter (Signed)
Message copied by Bernita Raisin on Tue Sep 02, 2013  3:42 PM ------      Message from: Lendon Colonel      Created: Tue Sep 02, 2013  7:17 AM       No fecal infection identified. Is he still dizzy? ------

## 2013-09-02 NOTE — Telephone Encounter (Signed)
Message copied by Bernita Raisin on Tue Sep 02, 2013  8:20 AM ------      Message from: Lendon Colonel      Created: Tue Sep 02, 2013  7:17 AM       No fecal infection identified. Is he still dizzy? ------

## 2013-09-02 NOTE — Telephone Encounter (Signed)
Daughter Bryce Mcdonald called stating father is acting strange.He has not driven a car in 2 yrs but got in his car today and drove to [place].He got out of car but was too weak to walk back to car and had to be helped into car by a stranger. He told her something is wrong with him and he told her to keep the car.She says he is still dizzy.She has attempted to call him but he states he goes to bed at 2:30 pm and sleeps to 11 pm.He then stays up all night because he is afraid he will die at night.She said this behavior has been going on for past 2 months and reports he is just acting strange

## 2013-09-04 ENCOUNTER — Ambulatory Visit (INDEPENDENT_AMBULATORY_CARE_PROVIDER_SITE_OTHER): Payer: Medicare Other | Admitting: Adult Health

## 2013-09-04 ENCOUNTER — Encounter: Payer: Self-pay | Admitting: Adult Health

## 2013-09-04 VITALS — BP 140/68 | HR 52 | Ht 68.0 in | Wt 174.0 lb

## 2013-09-04 DIAGNOSIS — I5033 Acute on chronic diastolic (congestive) heart failure: Secondary | ICD-10-CM

## 2013-09-04 DIAGNOSIS — I509 Heart failure, unspecified: Secondary | ICD-10-CM

## 2013-09-04 DIAGNOSIS — F0391 Unspecified dementia with behavioral disturbance: Secondary | ICD-10-CM

## 2013-09-04 DIAGNOSIS — I2589 Other forms of chronic ischemic heart disease: Secondary | ICD-10-CM

## 2013-09-04 DIAGNOSIS — F03918 Unspecified dementia, unspecified severity, with other behavioral disturbance: Secondary | ICD-10-CM

## 2013-09-04 DIAGNOSIS — F0390 Unspecified dementia without behavioral disturbance: Secondary | ICD-10-CM | POA: Insufficient documentation

## 2013-09-04 NOTE — Assessment & Plan Note (Signed)
Review of recent labs completed post office visit last week demonstrates renal function creatinine 1.37 this should improve also with decreased dose of Lasix.

## 2013-09-04 NOTE — Assessment & Plan Note (Signed)
No evidence of decompensation with lower dose of Lasix. Breathing status appears stable. He has not gained weight since being seen last. We will continue on lower dose of Lasix to prevent hypotension.

## 2013-09-04 NOTE — Patient Instructions (Addendum)
Your physician recommends that you schedule a follow-up appointment in: 1 month  Please NO driving  Primary care physician taking medicare:  Dr Anastasio Champion @ [facility]. [phone] Please call and make an appointment.  Your physician recommends that you continue on your current medications as directed. Please refer to the Current Medication list given to you today.

## 2013-09-04 NOTE — Assessment & Plan Note (Signed)
Patient blood pressures improved with medication choices on last visit. It could be a little at 130/65 range. I am happy with his blood pressure today as it is substantially improved from orthostatics dropping him in the 01I systolically. He is asymptomatic now with no further dizziness.

## 2013-09-04 NOTE — Assessment & Plan Note (Signed)
He is advised to be seen by a primary care physician for ongoing management of this. He is normally followed by the [facility], but has not been est. with a new [facility]. I have advised him to seek a primary care physician here locally, as he is not being seen by the [facility] again and tell 3-4 months.

## 2013-09-04 NOTE — Progress Notes (Deleted)
Name: Bryce Mcdonald    DOB: [birth year]  Age: 80 y.o.  MR#: [id number]       PCP:  PROVIDER NOT IN SYSTEM      Insurance: Payor: MEDICARE / Plan: MEDICARE PART B / Product Type: *No Product type* /   CC:    Chief Complaint  Patient presents with  . Congestive Heart Failure  . Atrial Fibrillation  . Cardiomyopathy    VS Filed Vitals:   09/04/13 1457  BP: 140/68  Pulse: 52  Height: 5\' 8"  (1.727 m)  Weight: 174 lb (78.926 kg)    Weights Current Weight  09/04/13 174 lb (78.926 kg)  08/29/13 174 lb (78.926 kg)  08/21/13 166 lb (75.297 kg)    Blood Pressure  BP Readings from Last 3 Encounters:  09/04/13 140/68  08/29/13 112/50  08/21/13 106/83     Admit date:  (Not on file) Last encounter with RMR:  08/29/2013   Allergy Codeine  Current Outpatient Prescriptions  Medication Sig Dispense Refill  . albuterol (PROVENTIL HFA) 108 (90 BASE) MCG/ACT inhaler Inhale 2 puffs into the lungs every 6 (six) hours as needed for wheezing or shortness of breath.      Marland Kitchen atorvastatin (LIPITOR) 80 MG tablet Take 80 mg by mouth daily.      . famotidine (PEPCID) 20 MG tablet Take 1 tablet (20 mg total) by mouth 2 (two) times daily.      . furosemide (LASIX) 20 MG tablet Take 1 tablet (20 mg total) by mouth every morning.  90 tablet  3  . levothyroxine (SYNTHROID, LEVOTHROID) 50 MCG tablet Take 50 mcg by mouth daily before breakfast.      . lisinopril (PRINIVIL,ZESTRIL) 10 MG tablet Take 1 tablet (10 mg total) by mouth daily.      . metoprolol tartrate (LOPRESSOR) 25 MG tablet Take 12.5 mg by mouth 2 (two) times daily.      . rivaroxaban (XARELTO) 20 MG TABS tablet Take 1 tablet (20 mg total) by mouth daily with supper.  30 tablet  3   No current facility-administered medications for this visit.    Discontinued Meds:   There are no discontinued medications.  Patient Active Problem List   Diagnosis Date Noted  . Dyspnea 07/26/2013  . Acute respiratory failure 07/26/2013  . Acute on  chronic diastolic CHF (congestive heart failure) 07/26/2013  . Malnutrition of moderate degree 12/06/2012  . Diarrhea 12/06/2012  . Calculus of left kidney 12/05/2012  . Bilateral renal cysts 12/05/2012  . Acute renal failure 12/04/2012  . Nausea alone 12/04/2012  . History of colon cancer 12/04/2012  . Abdominal pain, left lower quadrant 12/04/2012  . Heart failure 11/11/2012  . LVH (left ventricular hypertrophy) 10/11/2012  . DVT, bilateral lower limbs 10/10/2012  . Dysphagia 10/09/2012  . History of pulmonary embolism 10/09/2012  . Chronic anticoagulation 10/09/2012  . Leukopenia 10/09/2012  . Type 2 diabetes mellitus 10/09/2012  . Right bundle branch block 10/09/2012  . Unspecified hypothyroidism 10/09/2012  . Anemia 10/09/2012  . Atrial fibrillation with slow ventricular response 09/30/2012  . UTI (lower urinary tract infection) 10/16/2010  . HTN (hypertension) 10/16/2010  . Prostate cancer 10/16/2010  . Hyperlipidemia 10/16/2010  . Ventral hernia 10/16/2010    LABS    Component Value Date/Time   NA 138 08/29/2013 1643   NA 140 08/10/2013 1156   NA 142 07/29/2013 0634   K 4.4 08/29/2013 1643   K 4.1 08/10/2013 1156   K 3.5* 07/29/2013 5053  CL 99 08/29/2013 1643   CL 103 08/10/2013 1156   CL 99 07/29/2013 0634   CO2 27 08/29/2013 1643   CO2 24 08/10/2013 1156   CO2 31 07/29/2013 0634   GLUCOSE 100* 08/29/2013 1643   GLUCOSE 103* 08/10/2013 1156   GLUCOSE 110* 07/29/2013 0634   BUN 25* 08/29/2013 1643   BUN 16 08/10/2013 1156   BUN 26* 07/29/2013 0634   CREATININE 1.37* 08/29/2013 1643   CREATININE 1.20 08/10/2013 1156   CREATININE 1.18 07/29/2013 0634   CREATININE 1.19 07/28/2013 0712   CALCIUM 10.1 08/29/2013 1643   CALCIUM 9.4 08/10/2013 1156   CALCIUM 9.5 07/29/2013 0634   GFRNONAA 52* 08/10/2013 1156   GFRNONAA 53* 07/29/2013 0634   GFRNONAA 52* 07/28/2013 0712   GFRAA 60* 08/10/2013 1156   GFRAA 61* 07/29/2013 0634   GFRAA 61* 07/28/2013 0712   CMP     Component Value Date/Time   NA  138 08/29/2013 1643   K 4.4 08/29/2013 1643   CL 99 08/29/2013 1643   CO2 27 08/29/2013 1643   GLUCOSE 100* 08/29/2013 1643   BUN 25* 08/29/2013 1643   CREATININE 1.37* 08/29/2013 1643   CREATININE 1.20 08/10/2013 1156   CALCIUM 10.1 08/29/2013 1643   PROT 7.4 08/29/2013 1643   ALBUMIN 4.6 08/29/2013 1643   AST 17 08/29/2013 1643   ALT 14 08/29/2013 1643   ALKPHOS 93 08/29/2013 1643   BILITOT 0.5 08/29/2013 1643   GFRNONAA 52* 08/10/2013 1156   GFRAA 60* 08/10/2013 1156       Component Value Date/Time   WBC 3.0* 08/29/2013 1643   WBC 2.9* 08/10/2013 1156   WBC 3.0* 07/29/2013 0634   HGB 13.4 08/29/2013 1643   HGB 11.7* 08/10/2013 1156   HGB 12.0* 07/29/2013 0634   HCT 39.9 08/29/2013 1643   HCT 35.3* 08/10/2013 1156   HCT 36.6* 07/29/2013 0634   MCV 90.9 08/29/2013 1643   MCV 96.2 08/10/2013 1156   MCV 94.6 07/29/2013 0634    Lipid Panel     Component Value Date/Time   CHOL 106 12/04/2012 0930   TRIG 120 12/04/2012 0930   HDL 27* 12/04/2012 0930   CHOLHDL 3.9 12/04/2012 0930   VLDL 24 12/04/2012 0930   LDLCALC 55 12/04/2012 0930    ABG    Component Value Date/Time   PHART 7.407 12/29/2012 1055   PCO2ART 36.9 12/29/2012 1055   PO2ART 79.3* 12/29/2012 1055   HCO3 22.8 12/29/2012 1055   TCO2 21.1 12/29/2012 1055   ACIDBASEDEF 1.3 12/29/2012 1055   O2SAT 93.9 12/29/2012 1055     Lab Results  Component Value Date   TSH 4.410 07/26/2013   BNP (last 3 results)  Recent Labs  11/10/12 2136 06/26/13 0401 07/26/13 1128  PROBNP 1765.0* 1461.0* 2036.0*   Cardiac Panel (last 3 results) No results found for this basename: CKTOTAL, CKMB, TROPONINI, RELINDX,  in the last 72 hours  Iron/TIBC/Ferritin/ %Sat    Component Value Date/Time   IRON 71 10/10/2012 0224   TIBC 239 10/10/2012 0224   FERRITIN 313 10/10/2012 0224   IRONPCTSAT 30 10/10/2012 0224     EKG Orders placed in visit on 08/29/13  . EKG 12-LEAD     Prior Assessment and Plan Problem List as of 09/04/2013     Cardiovascular and  Mediastinum   HTN (hypertension)   Last Assessment & Plan   08/29/2013 Office Visit Written 08/29/2013  4:30 PM by Lendon Colonel, NP     The  patient is having significant symptoms of dizziness especially in the morning, but occasionally during the day when turning his head to one side. Carotid exam is negative. I have done orthostatic blood pressures here in the office. Lying 122/62 with pulse of 73 sitting 100/62 with a pulse of 53, standing 88/58 with a pulse of 47.  He is significantly orthostatic. Home if medication adjustments as follows:  1. Decrease Lasix to 20 mg daily (from 40 mg daily)D 2. Discontinue Hytrin 3. Discontinue isosorbide mono  I will have labs completed CMET, CBC. I will see him again in one week. May need to consider decreasing his lisinopril.as well if he continues to remain hypotensive, symptomatically.    Atrial fibrillation with slow ventricular response   Last Assessment & Plan   08/29/2013 Office Visit Written 08/29/2013  4:32 PM by Lendon Colonel, NP     Continues on Xarelto, denies any bleeding. Heart rate is well-controlled but does drop significantly during orthostatic blood pressures pulse 73 down to 47 beats per minute. He will continue him on metoprolol for now in hopes that once the other medication adjustments are made he will no longer have issues with dizziness.    Right bundle branch block   DVT, bilateral lower limbs   LVH (left ventricular hypertrophy)   Heart failure   Acute on chronic diastolic CHF (congestive heart failure)   Last Assessment & Plan   08/29/2013 Office Visit Written 08/29/2013  4:33 PM by Lendon Colonel, NP     He has not appear to be in fluid overload at this time. In fact some kind of concerned about dehydration with history of frequent diarrhea. He is also on 40 mg of Lasix daily and as stated will decrease it to 20 mg daily. I have reviewed his most recent echocardiogram which was completed in June of 2015 revealing  an EF of 40-50%. He may be in too much medication, and they have been adjusted as described above, with further adjustments as necessary based upon his symptoms.      Respiratory   Acute respiratory failure     Digestive   Dysphagia     Endocrine   Type 2 diabetes mellitus   Last Assessment & Plan   08/29/2013 Office Visit Written 08/29/2013  4:31 PM by Lendon Colonel, NP     Hemoglobin A1c will be checked.    Unspecified hypothyroidism     Genitourinary   Prostate cancer   UTI (lower urinary tract infection)   Acute renal failure   Calculus of left kidney   Bilateral renal cysts     Other   Hyperlipidemia   Last Assessment & Plan   10/25/2012 Office Visit Written 10/25/2012  1:42 PM by Lendon Colonel, NP     He continues on simvastatin as directed. He is followed by PCP for ongoing labs.    Ventral hernia   History of pulmonary embolism   Chronic anticoagulation   Last Assessment & Plan   10/25/2012 Office Visit Written 10/25/2012  1:44 PM by Lendon Colonel, NP     Remains on LMWH as directed for PE and DVT. He is medically compliant.    Leukopenia   Anemia   Last Assessment & Plan   08/29/2013 Office Visit Written 08/29/2013  4:31 PM by Lendon Colonel, NP     CBC is completed today. He denies any bleeding with use of Xarelto, but we will check nonetheless due to his significant  orthostatic hypotension and dizziness    Nausea alone   History of colon cancer   Abdominal pain, left lower quadrant   Malnutrition of moderate degree   Diarrhea   Last Assessment & Plan   08/29/2013 Office Visit Written 08/29/2013  4:31 PM by Lendon Colonel, NP     He will have a stool sample checked to evaluate for C. difficile.    Dyspnea       Imaging: Dg Chest 2 View  08/10/2013   CLINICAL DATA:  Weakness, shortness of breath, BILATERAL lower extremity swelling, history colon cancer, hypertension, coronary artery disease, DVT, PE, diabetes, COPD  EXAM: CHEST  2 VIEW   COMPARISON:  07/26/2013  FINDINGS: Borderline enlargement of cardiac silhouette.  Atherosclerotic calcification aorta.  Mediastinal contours and pulmonary vascularity normal.  Emphysematous changes with bibasilar scarring greater on RIGHT.  No acute infiltrate, pleural effusion or pneumothorax.  Bones demineralized.  IMPRESSION: COPD changes with bibasilar scarring greater on RIGHT.  Borderline enlargement of cardiac silhouette.  No acute abnormalities.   Electronically Signed   By: Lavonia Dana M.D.   On: 08/10/2013 12:56   Nm Myocar Single W/spect W/wall Motion And Ef  08/27/2013   CLINICAL DATA:  80 year old male with history of atrial fibrillation, CAD, hypertension, pulmonary embolus, and cardiomyopathy with LVEF 40-50%. This study is requested to evaluate for the presence and extent of ischemia.  EXAM: MYOCARDIAL IMAGING WITH SPECT (REST AND PHARMACOLOGIC-STRESS)  GATED LEFT VENTRICULAR WALL MOTION STUDY  LEFT VENTRICULAR EJECTION FRACTION  TECHNIQUE: Standard myocardial SPECT imaging was performed after resting intravenous injection of 10 mCi Tc-27m sestamibi. Subsequently, intravenous infusion of Lexiscan was performed under the supervision of the Cardiology staff. At peak effect of the drug, 30 mCi Tc-29m sestamibi was injected intravenously and standard myocardial SPECT imaging was performed. Quantitative gated imaging was also performed to evaluate left ventricular wall motion, and estimate left ventricular ejection fraction.  FINDINGS: Baseline tracing shows atrial fibrillation at 68 beats per min with right bundle branch block and left anterior fascicular block. Lexiscan bolus was given in standard fashion. Heart rate increased from 67 beats per min up to 91 beats per min, and blood pressure remained stable at 117/70. No chest pain was reported. There were no diagnostic ST segment abnormalities.  Analysis of the overall perfusion data shows adequate radiotracer uptake with diaphragmatic attenuation.   Tomographic views were obtained using the short axis, vertical long axis, and horizontal long axis planes. There is a moderate size, severe intensity, inferior wall defect extending from apex to base. This region is fixed and suggestive of scar, no large ischemic zones are noted.  Gated imaging reveals an EDV of 82, ESV of 51, LVEF of 37%, and TID ratio 1.05. There is diffuse hypokinesis  IMPRESSION: Low to intermediate risk Lexiscan Cardiolite. There were no diagnostic ST segment abnormalities, atrial fibrillation present throughout. Perfusion imaging is most consistent with scar in the inferior wall without any large ischemic territories. LVEF is calculated at 37% with fairly diffuse hypokinesis and normal volumes.   Electronically Signed   By: Rozann Lesches M.D.   On: 08/27/2013 15:11

## 2013-09-04 NOTE — Assessment & Plan Note (Signed)
Patient is having frequent confused thoughts, rambling thoughts, unable to concentrate on questions. He has also had reported getting in his car in night and driving becoming lost and weak. I have asked his family member to not allow him to drive take his keys in his car from him. They are advised to bring him to appointments, and church, as he is adamant about attending.  I have asked him to be est. with a primary care physician locally. He may need to have a life watch placed, for further monitoring and also if he begins to wonder, he can be easily found. This can be started by his primary care physician. As I will defer medical management to him.

## 2013-09-04 NOTE — Progress Notes (Signed)
HPI: [name] is an 80 year old patient of Dr. Pennelope Mcdonald or following for ongoing assessment and management of chronic diastolic systolic heart failure, atrial fibrillation, history of DVT PE, on Xarelto, cardiomyopathy with an EF of 40-50% with mild mitral regurg and moderate tricuspid regurg. He was last seen in the office on 08/29/2013 D2 severe dizziness and near syncope. On that office visit the patient was found to be significantly orthostatically hypotensive. His Lasix was decreased to 20 mg daily, we discontinued Hytrin and discontinue isosorbide. Followup labs were completed to include a cemented and a CBC and he is here for close followup to evaluate his status. He also been complaining of frequent diarrhea and stool samples were sent to evaluate for C. Difficile.  The patient is normally followed by the [facility], but has not been happy with their care, and is currently trying to locate a new primary care physician locally. He continues to have issues with diabetes control.  Labs were all within normal limits and he was negative for C. difficile. Since that time, we have received phone calls from the family stating that he is becoming more confused, sleeping from 2:30 in the afternoon until 11 PM and getting up and being up all night, driving and getting lost, and becoming weak. He has been advised not to drive on return phone call from our office.  He comes today with better blood pressure levels, denies any more dizziness, or diarrhea. The patient has rambling thought processes and is unable to concentrate fully on my questioning. The patient's sister is with him. She states overall he is doing better with the exception of the worsening dementia. He has not been est. with a primary care physician since last office visit.  Allergies  Allergen Reactions  . Codeine Shortness Of Breath    Shortness of breath    Current Outpatient Prescriptions  Medication Sig Dispense Refill  . albuterol  (PROVENTIL HFA) 108 (90 BASE) MCG/ACT inhaler Inhale 2 puffs into the lungs every 6 (six) hours as needed for wheezing or shortness of breath.      Marland Kitchen atorvastatin (LIPITOR) 80 MG tablet Take 80 mg by mouth daily.      . famotidine (PEPCID) 20 MG tablet Take 1 tablet (20 mg total) by mouth 2 (two) times daily.      . furosemide (LASIX) 20 MG tablet Take 1 tablet (20 mg total) by mouth every morning.  90 tablet  3  . levothyroxine (SYNTHROID, LEVOTHROID) 50 MCG tablet Take 50 mcg by mouth daily before breakfast.      . lisinopril (PRINIVIL,ZESTRIL) 10 MG tablet Take 1 tablet (10 mg total) by mouth daily.      . metoprolol tartrate (LOPRESSOR) 25 MG tablet Take 12.5 mg by mouth 2 (two) times daily.      . rivaroxaban (XARELTO) 20 MG TABS tablet Take 1 tablet (20 mg total) by mouth daily with supper.  30 tablet  3   No current facility-administered medications for this visit.    Past Medical History  Diagnosis Date  . Colon cancer     Status post partial colectomy  . Hypertension   . Hypercholesterolemia   . Blood transfusion   . Coronary artery disease 2011    Non obstructive CAD  . DVT (deep venous thrombosis)   . PE (pulmonary embolism)   . Diabetes mellitus without complication   . A-fib   . Diabetes mellitus, type II   . Ventral hernia   .  Right bundle branch block   . Unspecified hypothyroidism   . LVH (left ventricular hypertrophy) 10/10/2012    Ejection fraction 60-65%.  . Bilateral renal cysts   . Calculus of left kidney     non-obstructing  . COPD (chronic obstructive pulmonary disease)     Past Surgical History  Procedure Laterality Date  . Abdominal surgery      x 3  . Cardiac catheterization  2008    ROS: Review of systems complete and found to be negative unless listed above PHYSICAL EXAM BP 140/68  Pulse 52  Ht 5\' 8"  (1.727 m)  Wt 174 lb (78.926 kg)  BMI 26.46 kg/m2 General: Well developed, well nourished, in no acute distress Head: Eyes PERRLA, No  xanthomas.   Normal cephalic and atramatic  Lungs: Clear bilaterally to auscultation and percussion. Heart: HRRR S1 S2, without MRG.  Pulses are 2+ & equal.            No carotid bruit. No JVD.  No abdominal bruits. No femoral bruits. Abdomen: Bowel sounds are positive, abdomen soft and non-tender without masses or                  Hernia's noted. Msk:  Back normal, slow gait, using walker for ambulation.  Normal strength and tone for age. Extremities: No clubbing, cyanosis or edema.  DP +1 Neuro: Alert and oriented X 3. Psych:  Good affect, responds appropriately.  ASSESSMENT AND PLAN

## 2013-09-07 ENCOUNTER — Encounter (HOSPITAL_COMMUNITY): Payer: Self-pay | Admitting: Emergency Medicine

## 2013-09-07 ENCOUNTER — Observation Stay (HOSPITAL_COMMUNITY)
Admission: EM | Admit: 2013-09-07 | Discharge: 2013-09-09 | Disposition: A | Discharge status: Home / Self Care | Payer: Non-veteran care | Attending: Internal Medicine | Admitting: Internal Medicine

## 2013-09-07 DIAGNOSIS — I498 Other specified cardiac arrhythmias: Secondary | ICD-10-CM | POA: Diagnosis not present

## 2013-09-07 DIAGNOSIS — I509 Heart failure, unspecified: Secondary | ICD-10-CM | POA: Insufficient documentation

## 2013-09-07 DIAGNOSIS — F0390 Unspecified dementia without behavioral disturbance: Secondary | ICD-10-CM | POA: Insufficient documentation

## 2013-09-07 DIAGNOSIS — I4891 Unspecified atrial fibrillation: Secondary | ICD-10-CM | POA: Diagnosis not present

## 2013-09-07 DIAGNOSIS — Z87891 Personal history of nicotine dependence: Secondary | ICD-10-CM | POA: Diagnosis not present

## 2013-09-07 DIAGNOSIS — Z86718 Personal history of other venous thrombosis and embolism: Secondary | ICD-10-CM | POA: Insufficient documentation

## 2013-09-07 DIAGNOSIS — I951 Orthostatic hypotension: Secondary | ICD-10-CM | POA: Diagnosis not present

## 2013-09-07 DIAGNOSIS — J4489 Other specified chronic obstructive pulmonary disease: Secondary | ICD-10-CM | POA: Insufficient documentation

## 2013-09-07 DIAGNOSIS — Z86711 Personal history of pulmonary embolism: Secondary | ICD-10-CM | POA: Insufficient documentation

## 2013-09-07 DIAGNOSIS — E119 Type 2 diabetes mellitus without complications: Secondary | ICD-10-CM | POA: Insufficient documentation

## 2013-09-07 DIAGNOSIS — Z794 Long term (current) use of insulin: Secondary | ICD-10-CM | POA: Insufficient documentation

## 2013-09-07 DIAGNOSIS — E440 Moderate protein-calorie malnutrition: Secondary | ICD-10-CM | POA: Diagnosis not present

## 2013-09-07 DIAGNOSIS — I5042 Chronic combined systolic (congestive) and diastolic (congestive) heart failure: Secondary | ICD-10-CM | POA: Diagnosis not present

## 2013-09-07 DIAGNOSIS — E039 Hypothyroidism, unspecified: Secondary | ICD-10-CM | POA: Diagnosis not present

## 2013-09-07 DIAGNOSIS — Z6826 Body mass index (BMI) 26.0-26.9, adult: Secondary | ICD-10-CM | POA: Diagnosis not present

## 2013-09-07 DIAGNOSIS — Z7901 Long term (current) use of anticoagulants: Secondary | ICD-10-CM | POA: Insufficient documentation

## 2013-09-07 DIAGNOSIS — I10 Essential (primary) hypertension: Secondary | ICD-10-CM | POA: Insufficient documentation

## 2013-09-07 DIAGNOSIS — R001 Bradycardia, unspecified: Secondary | ICD-10-CM

## 2013-09-07 DIAGNOSIS — R55 Syncope and collapse: Principal | ICD-10-CM | POA: Insufficient documentation

## 2013-09-07 DIAGNOSIS — I5033 Acute on chronic diastolic (congestive) heart failure: Secondary | ICD-10-CM

## 2013-09-07 DIAGNOSIS — R197 Diarrhea, unspecified: Secondary | ICD-10-CM | POA: Diagnosis not present

## 2013-09-07 DIAGNOSIS — E785 Hyperlipidemia, unspecified: Secondary | ICD-10-CM | POA: Diagnosis not present

## 2013-09-07 DIAGNOSIS — J449 Chronic obstructive pulmonary disease, unspecified: Secondary | ICD-10-CM | POA: Diagnosis not present

## 2013-09-07 LAB — CBC WITH DIFFERENTIAL/PLATELET
BASOS PCT: 0 % (ref 0–1)
Basophils Absolute: 0 10*3/uL (ref 0.0–0.1)
EOS ABS: 0 10*3/uL (ref 0.0–0.7)
EOS PCT: 1 % (ref 0–5)
HCT: 39.3 % (ref 39.0–52.0)
Hemoglobin: 12.9 g/dL — ABNORMAL LOW (ref 13.0–17.0)
Lymphocytes Relative: 62 % — ABNORMAL HIGH (ref 12–46)
Lymphs Abs: 1.9 10*3/uL (ref 0.7–4.0)
MCH: 31.5 pg (ref 26.0–34.0)
MCHC: 32.8 g/dL (ref 30.0–36.0)
MCV: 95.9 fL (ref 78.0–100.0)
MONO ABS: 0.3 10*3/uL (ref 0.1–1.0)
Monocytes Relative: 11 % (ref 3–12)
NEUTROS ABS: 0.8 10*3/uL — AB (ref 1.7–7.7)
NEUTROS PCT: 26 % — AB (ref 43–77)
Platelets: 154 10*3/uL (ref 150–400)
RBC: 4.1 MIL/uL — ABNORMAL LOW (ref 4.22–5.81)
RDW: 14.1 % (ref 11.5–15.5)
WBC: 3 10*3/uL — AB (ref 4.0–10.5)

## 2013-09-07 LAB — BASIC METABOLIC PANEL
ANION GAP: 11 (ref 5–15)
BUN: 19 mg/dL (ref 6–23)
CALCIUM: 9.8 mg/dL (ref 8.4–10.5)
CHLORIDE: 105 meq/L (ref 96–112)
CO2: 24 meq/L (ref 19–32)
CREATININE: 1.14 mg/dL (ref 0.50–1.35)
GFR calc Af Amer: 64 mL/min — ABNORMAL LOW (ref >90)
GFR calc non Af Amer: 55 mL/min — ABNORMAL LOW (ref >90)
GLUCOSE: 94 mg/dL (ref 70–99)
Potassium: 4.8 mEq/L (ref 3.7–5.3)
Sodium: 140 mEq/L (ref 137–147)

## 2013-09-07 LAB — I-STAT CG4 LACTIC ACID, ED: LACTIC ACID, VENOUS: 0.99 mmol/L (ref 0.5–2.2)

## 2013-09-07 LAB — GLUCOSE, CAPILLARY
GLUCOSE-CAPILLARY: 110 mg/dL — AB (ref 70–99)
Glucose-Capillary: 109 mg/dL — ABNORMAL HIGH (ref 70–99)

## 2013-09-07 LAB — TROPONIN I: Troponin I: <0.3 ng/mL (ref <0.30)

## 2013-09-07 LAB — MAGNESIUM: Magnesium: 2 mg/dL (ref 1.5–2.5)

## 2013-09-07 LAB — MRSA PCR SCREENING: MRSA by PCR: NEGATIVE

## 2013-09-07 LAB — CLOSTRIDIUM DIFFICILE BY PCR: Toxigenic C. Difficile by PCR: NEGATIVE

## 2013-09-07 MED ORDER — LORAZEPAM 0.5 MG PO TABS
1.0000 mg | ORAL_TABLET | Freq: Once | ORAL | Status: AC
  Administered 2013-09-07: 0.25 mg via ORAL
  Filled 2013-09-07: qty 2

## 2013-09-07 MED ORDER — ALBUTEROL SULFATE (2.5 MG/3ML) 0.083% IN NEBU: 2.5000 mg | INHALATION_SOLUTION | Freq: Four times a day (QID) | RESPIRATORY_TRACT | Status: DC | PRN

## 2013-09-07 MED ORDER — FAMOTIDINE 20 MG PO TABS
20.0000 mg | ORAL_TABLET | Freq: Two times a day (BID) | ORAL | Status: DC
  Administered 2013-09-07 – 2013-09-09 (×5): 20 mg via ORAL
  Filled 2013-09-07 (×5): qty 1

## 2013-09-07 MED ORDER — RIVAROXABAN 20 MG PO TABS
20.0000 mg | ORAL_TABLET | Freq: Every day | ORAL | Status: DC
  Administered 2013-09-07 – 2013-09-08 (×2): 20 mg via ORAL
  Filled 2013-09-07 (×2): qty 1

## 2013-09-07 MED ORDER — LEVOTHYROXINE SODIUM 25 MCG PO TABS
50.0000 ug | ORAL_TABLET | Freq: Every day | ORAL | Status: DC
  Administered 2013-09-08 – 2013-09-09 (×2): 50 ug via ORAL
  Filled 2013-09-07 (×2): qty 2

## 2013-09-07 MED ORDER — LOPERAMIDE HCL 2 MG PO CAPS
4.0000 mg | ORAL_CAPSULE | Freq: Once | ORAL | Status: AC
  Administered 2013-09-07: 4 mg via ORAL
  Filled 2013-09-07: qty 2

## 2013-09-07 MED ORDER — ATORVASTATIN CALCIUM 40 MG PO TABS
80.0000 mg | ORAL_TABLET | Freq: Every day | ORAL | Status: DC
Start: 2013-09-07 — End: 2013-09-09
  Administered 2013-09-07 – 2013-09-09 (×3): 80 mg via ORAL
  Filled 2013-09-07 (×3): qty 2

## 2013-09-07 MED ORDER — SODIUM CHLORIDE 0.9 % IJ SOLN
3.0000 mL | Freq: Two times a day (BID) | INTRAMUSCULAR | Status: DC
  Administered 2013-09-07 – 2013-09-09 (×5): 3 mL via INTRAVENOUS

## 2013-09-07 MED ORDER — SODIUM CHLORIDE 0.9 % IV BOLUS (SEPSIS)
500.0000 mL | Freq: Once | INTRAVENOUS | Status: AC
  Administered 2013-09-07: 500 mL via INTRAVENOUS

## 2013-09-07 MED ORDER — ALBUTEROL SULFATE HFA 108 (90 BASE) MCG/ACT IN AERS
2.0000 | INHALATION_SPRAY | Freq: Four times a day (QID) | RESPIRATORY_TRACT | Status: DC | PRN
Start: 2013-09-07 — End: 2013-09-07

## 2013-09-07 MED ORDER — INSULIN ASPART 100 UNIT/ML ~~LOC~~ SOLN
0.0000 [IU] | Freq: Three times a day (TID) | SUBCUTANEOUS | Status: DC
  Administered 2013-09-08: 1 [IU] via SUBCUTANEOUS

## 2013-09-07 NOTE — ED Notes (Signed)
Breakfast tray ordered for pt,  

## 2013-09-07 NOTE — ED Notes (Signed)
Report given to ICU, pt eating breakfast, will transport pt to floor when pt is finished with breakfast,

## 2013-09-07 NOTE — ED Notes (Signed)
Received report on pt, pt lying semi fowler's in bed, family at bedside, pt a-fib on monitor with rate that will drop to mid 40's, usual rate on monitor has been in 50's, pt denies any symptoms at present, update given on plan of care,

## 2013-09-07 NOTE — ED Notes (Signed)
Dr Cruzita Lederer (hospitalist) at bedside,

## 2013-09-07 NOTE — ED Notes (Signed)
Dr Roxanne Mins notified of heart rate,

## 2013-09-07 NOTE — ED Notes (Signed)
Dr Jeanell Sparrow at bedside, contacted lab who advised that the blood work is "spinning"

## 2013-09-07 NOTE — ED Notes (Signed)
Family at bedside.Patient placed in hospital gown and placed on cardiac monitor.

## 2013-09-07 NOTE — ED Notes (Signed)
Pt has history of A-fib and having fainting spells, the last one this am.

## 2013-09-07 NOTE — ED Provider Notes (Signed)
80 y.o. Male with history of atrial fibrillation. Pt states that he has had 5 or 6 episodes of loose stools last night. Pt states that he got up last night to go to bathroom and experienced syncopal episode. Pt is currently here, stable and orthostatic. We're waiting for lab work, probably going to be admitted to hospital[facility]   8:02 AM Dr. Jeanell Sparrow took over Pt's care. Pt's previous HPI was discussed. He denies any fever, nausea or vomiting. He does repot 5 or 6 episodes of diarrhea but denies any since arriving here at ED. When pt is asked about history of syncope, He states that he does pass out "all the time".  Results for orders placed during the hospital encounter of 09/07/13  CBC WITH DIFFERENTIAL      Result Value Ref Range   WBC 3.0 (*) 4.0 - 10.5 K/uL   RBC 4.10 (*) 4.22 - 5.81 MIL/uL   Hemoglobin 12.9 (*) 13.0 - 17.0 g/dL   HCT 39.3  39.0 - 52.0 %   MCV 95.9  78.0 - 100.0 fL   MCH 31.5  26.0 - 34.0 pg   MCHC 32.8  30.0 - 36.0 g/dL   RDW 14.1  11.5 - 15.5 %   Platelets 154  150 - 400 K/uL   Neutrophils Relative % 26 (*) 43 - 77 %   Lymphocytes Relative 62 (*) 12 - 46 %   Monocytes Relative 11  3 - 12 %   Eosinophils Relative 1  0 - 5 %   Basophils Relative 0  0 - 1 %   Neutro Abs 0.8 (*) 1.7 - 7.7 K/uL   Lymphs Abs 1.9  0.7 - 4.0 K/uL   Monocytes Absolute 0.3  0.1 - 1.0 K/uL   Eosinophils Absolute 0.0  0.0 - 0.7 K/uL   Basophils Absolute 0.0  0.0 - 0.1 K/uL   RBC Morphology POLYCHROMASIA PRESENT     WBC Morphology ATYPICAL LYMPHOCYTES    BASIC METABOLIC PANEL      Result Value Ref Range   Sodium 140  137 - 147 mEq/L   Potassium 4.8  3.7 - 5.3 mEq/L   Chloride 105  96 - 112 mEq/L   CO2 24  19 - 32 mEq/L   Glucose, Bld 94  70 - 99 mg/dL   BUN 19  6 - 23 mg/dL   Creatinine, Ser 1.14  0.50 - 1.35 mg/dL   Calcium 9.8  8.4 - 10.5 mg/dL   GFR calc non Af Amer 55 (*) >90 mL/min   GFR calc Af Amer 64 (*) >90 mL/min   Anion gap 11  5 - 15  TROPONIN I   Result Value Ref Range   Troponin I <0.30  <0.30 ng/mL  I-STAT CG4 LACTIC ACID, ED      Result Value Ref Range   Lactic Acid, Venous 0.99  0.5 - 2.2 mmol/L     80 y.o. Male with afib with multiple syncopal episodes- meds recently had lasix decreased, long acting nitro stopped, and hytrin stopped.  Patient continues symptomatic with worsening syncopal episodes.  Patient had several episodes of diarrhea last night and had a syncopal episode going to the bathroom.  He denies injury.  Patient with some orthostatic hypotension likely causing syncope.  Plan observation with holding beta blocker and cardiology consult tomorrow.   Shaune Pollack, MD 09/07/13 [phone]

## 2013-09-07 NOTE — Progress Notes (Addendum)
Pt transferred from ED to unit. Pt is currently in no acute distress, he is in a-fib and bradycardiac (MD aware), alert/oriented x4, and has no complaints of pain. Will continue to monitor.

## 2013-09-07 NOTE — ED Notes (Signed)
Pt rhythm a-fib in  60-70's at present time,

## 2013-09-07 NOTE — ED Notes (Signed)
Pt given breakfast tray, family at bedside,

## 2013-09-07 NOTE — H&P (Signed)
History and Physical    Key Cen CWC:[id number] DOB: Jan 18, 1922 DOA: 09/07/2013  Referring physician: Dr. Jeanell Sparrow PCP: PROVIDER NOT IN SYSTEM  Specialists: none   Chief Complaint: syncope  HPI: Bryce Mcdonald is a 80 y.o. male has a past medical history significant for atrial fibrillation, hypertension, coronary artery disease, history of DVT and PE, diabetes mellitus, presented to the emergency room with a chief complaint of a syncopal episode this morning. Patient states that he will call early a.m. to go to the bathroom when he passed out. He denies head injury. This was all of a sudden. He also endorses having diarrhea for the past couple of days. He does have a component of chronic diarrhea, however it has really pick up in the last 2 days, yesterday he had 5 or 6 episodes of loose stools. He has a history of recurrent syncopal episodes, has been seen by cardiology as an outpatient, and his medications have been adjusted and discontinued, however he continued to have syncopal episodes. In the emergency room, patient is orthostatic. TR[facility] asked for admission for further evaluation. Patient is somewhat unreliable, but tells me that he has no chest pain or breathing difficulties, no nausea or vomiting but does have diarrhea. He denies any lightheadedness or dizziness. He denies any fevers or chills at home.   Review of Systems: As per history of present illness, otherwise negative  Past Medical History  Diagnosis Date  . Colon cancer     Status post partial colectomy  . Hypertension   . Hypercholesterolemia   . Blood transfusion   . Coronary artery disease 2011    Non obstructive CAD  . DVT (deep venous thrombosis)   . PE (pulmonary embolism)   . Diabetes mellitus without complication   . A-fib   . Diabetes mellitus, type II   . Ventral hernia   . Right bundle branch block   . Unspecified hypothyroidism   . LVH (left ventricular hypertrophy) 10/10/2012    Ejection fraction  60-65%.  . Bilateral renal cysts   . Calculus of left kidney     non-obstructing  . COPD (chronic obstructive pulmonary disease)    Past Surgical History  Procedure Laterality Date  . Abdominal surgery      x 3  . Cardiac catheterization  2008   Social History:  reports that he has quit smoking. His smoking use included Cigarettes. He smoked 0.00 packs per day. He has never used smokeless tobacco. He reports that he does not drink alcohol or use illicit drugs.  Allergies  Allergen Reactions  . Codeine Shortness Of Breath    Shortness of breath    History reviewed. No pertinent family history.  Prior to Admission medications   Medication Sig Start Date End Date Taking? Authorizing Provider  albuterol (PROVENTIL HFA) 108 (90 BASE) MCG/ACT inhaler Inhale 2 puffs into the lungs every 6 (six) hours as needed for wheezing or shortness of breath.   Yes Historical Provider, MD  atorvastatin (LIPITOR) 80 MG tablet Take 80 mg by mouth daily.   Yes Historical Provider, MD  famotidine (PEPCID) 20 MG tablet Take 1 tablet (20 mg total) by mouth 2 (two) times daily. 12/06/12  Yes Lezlie Octave Black, NP  furosemide (LASIX) 20 MG tablet Take 1 tablet (20 mg total) by mouth every morning. 08/29/13  Yes Lendon Colonel, NP  levothyroxine (SYNTHROID, LEVOTHROID) 50 MCG tablet Take 50 mcg by mouth daily before breakfast.   Yes Historical Provider, MD  lisinopril (  PRINIVIL,ZESTRIL) 10 MG tablet Take 1 tablet (10 mg total) by mouth daily. 07/29/13  Yes Kathie Dike, MD  metoprolol tartrate (LOPRESSOR) 25 MG tablet Take 12.5 mg by mouth 2 (two) times daily.   Yes Historical Provider, MD  rivaroxaban (XARELTO) 20 MG TABS tablet Take 1 tablet (20 mg total) by mouth daily with supper. 08/19/13  Yes Herminio Commons, MD   Physical Exam: Filed Vitals:   09/07/13 0830 09/07/13 1053 09/07/13 1054 09/07/13 1106  BP: 120/54 105/37    Pulse: 41   40  Temp:    97.4 F (36.3 C)  TempSrc:    Oral  Resp: 17  19 20    Height:      Weight:      SpO2: 99%   97%     General:   no acute distress   Eyes: no scleral icterus  ENT: moist oropharynx  Neck: supple, no JVD  Cardiovascular: Irregularly irregular   Respiratory:  good air movement, clear to auscultation, no wheezing or crackles   Abdomen: soft, non tender to palpation, positive bowel sounds, no guarding, no rebound  Skin: no rashes  Musculoskeletal: no peripheral edema  Neurologic: nonfocal  Labs on Admission:  Basic Metabolic Panel:  Recent Labs Lab 09/07/13 0610  NA 140  K 4.8  CL 105  CO2 24  GLUCOSE 94  BUN 19  CREATININE 1.14  CALCIUM 9.8   CBC:  Recent Labs Lab 09/07/13 0610  WBC 3.0*  NEUTROABS 0.8*  HGB 12.9*  HCT 39.3  MCV 95.9  PLT 154   Cardiac Enzymes:  Recent Labs Lab 09/07/13 0610  TROPONINI <0.30    BNP (last 3 results)  Recent Labs  11/10/12 2136 06/26/13 0401 07/26/13 1128  PROBNP 1765.0* 1461.0* 2036.0*   Radiological Exams on Admission: No results found.  EKG: Independently reviewed.  Assessment/Plan Active Problems:   HTN (hypertension)   Hyperlipidemia   Atrial fibrillation with slow ventricular response   Chronic anticoagulation   Type 2 diabetes mellitus   Unspecified hypothyroidism   Heart failure   Malnutrition of moderate degree   Diarrhea   Dementia   Syncope  Syncope - will admit patient and monitor on telemetry. For now I am going to hold his beta blockers since he is somewhat bradycardic. Hold his lisinopril since he is orthostatic in the emergency room. Provide gentle hydration of 500 cc normal saline over 10 hours.  - Cardiology consultation in the morning Diarrhea - we'll check a C. Difficile again, it was negative on 7/12. Patient is an unreliable historian, however he tells me he has been on antibiotic for his right toe nail infection. Chronic systolic and diastolic heart failure - last 2-D echo was done about one month ago that showed an EF of  40-50% with diffuse hypokinesis. Patient clinically appears dry in the emergency room, will closely monitor fluid status. - Holding the ACE inhibitor and beta blocker for now. Cardiology consult in the morning. Hypothyroidism - we'll resume his home Synthroid. Check TSH. Diabetes mellitus - most recent hemoglobin A1c was 6.4 about a week ago. Sliding scale insulin. Hyperlipidemia - resume atorvastatin Chronic anticoagulation - History of DVTs/PE and atrial fibrillation. Resume rivaroxaban.   Diet: Heart healthy Fluids: Normal saline at 50 cc per hour for 10 hours  DVT Prophylaxis: rivaroxaban   Code Status:  full code   Family Communication:  none   Disposition Plan: Admit to telemetry   Time spent:  70  This note has  been created with Surveyor, quantity. Any transcriptional errors are unintentional.   Costin M. Cruzita Lederer, MD [facility] Pager [phone]  If 7PM-7AM, please contact night-coverage [email or web address] Password [facility] 09/07/2013, 12:41 PM

## 2013-09-07 NOTE — ED Notes (Addendum)
Pt denies any pain, complains of "not feeling well"   States "I just don't feel well at all", pt and family updated on admission,

## 2013-09-07 NOTE — ED Provider Notes (Signed)
CSN: [id number]     Arrival date & time 09/07/13  0546 History   First MD Initiated Contact with Patient 09/07/13 0557     Chief Complaint  Patient presents with  . Loss of Consciousness     (Consider location/radiation/quality/duration/timing/severity/associated sxs/prior Treatment) Patient is a 80 y.o. male presenting with syncope. The history is provided by the patient.  Loss of Consciousness He had a syncopal episode while walking to bathroom. He states been having diarrhea since last evening and has been up to the bathroom at least 5 times during the night. The last time, he got dizzy as he got up from bed and had brief episode of syncope. There is no associated chest pain, heaviness, tightness, pressure. There is no nausea or vomiting and no diaphoresis. He does have history of syncope.  Past Medical History  Diagnosis Date  . Colon cancer     Status post partial colectomy  . Hypertension   . Hypercholesterolemia   . Blood transfusion   . Coronary artery disease 2011    Non obstructive CAD  . DVT (deep venous thrombosis)   . PE (pulmonary embolism)   . Diabetes mellitus without complication   . A-fib   . Diabetes mellitus, type II   . Ventral hernia   . Right bundle branch block   . Unspecified hypothyroidism   . LVH (left ventricular hypertrophy) 10/10/2012    Ejection fraction 60-65%.  . Bilateral renal cysts   . Calculus of left kidney     non-obstructing  . COPD (chronic obstructive pulmonary disease)    Past Surgical History  Procedure Laterality Date  . Abdominal surgery      x 3  . Cardiac catheterization  2008   No family history on file. History  Substance Use Topics  . Smoking status: Former Smoker    Types: Cigarettes  . Smokeless tobacco: Never Used     Comment: quit over 40 years ago  . Alcohol Use: No     Comment: quit over forty years ago    Review of Systems  Cardiovascular: Positive for syncope.  All other systems reviewed and are  negative.     Allergies  Codeine  Home Medications   Prior to Admission medications   Medication Sig Start Date End Date Taking? Authorizing Provider  albuterol (PROVENTIL HFA) 108 (90 BASE) MCG/ACT inhaler Inhale 2 puffs into the lungs every 6 (six) hours as needed for wheezing or shortness of breath.    Historical Provider, MD  atorvastatin (LIPITOR) 80 MG tablet Take 80 mg by mouth daily.    Historical Provider, MD  famotidine (PEPCID) 20 MG tablet Take 1 tablet (20 mg total) by mouth 2 (two) times daily. 12/06/12   Radene Gunning, NP  furosemide (LASIX) 20 MG tablet Take 1 tablet (20 mg total) by mouth every morning. 08/29/13   Lendon Colonel, NP  levothyroxine (SYNTHROID, LEVOTHROID) 50 MCG tablet Take 50 mcg by mouth daily before breakfast.    Historical Provider, MD  lisinopril (PRINIVIL,ZESTRIL) 10 MG tablet Take 1 tablet (10 mg total) by mouth daily. 07/29/13   Kathie Dike, MD  metoprolol tartrate (LOPRESSOR) 25 MG tablet Take 12.5 mg by mouth 2 (two) times daily.    Historical Provider, MD  rivaroxaban (XARELTO) 20 MG TABS tablet Take 1 tablet (20 mg total) by mouth daily with supper. 08/19/13   Herminio Commons, MD   BP 138/70  Pulse 85  Temp(Src) 98 F (36.7 C) (Oral)  Ht 5\' 8"  (1.727 m)  Wt 177 lb (80.287 kg)  BMI 26.92 kg/m2  SpO2 98% Physical Exam  Nursing note and vitals reviewed.  80 year old male, resting comfortably and in no acute distress. Vital signs are normal. Oxygen saturation is 98%, which is normal. Head is normocephalic and atraumatic. PERRLA, EOMI. Oropharynx is clear. Neck is nontender and supple without adenopathy or JVD. Back is nontender and there is no CVA tenderness. Lungs are clear without rales, wheezes, or rhonchi. Chest is nontender. Heart is irregularly irregular without murmur. Abdomen is soft, flat, nontender without masses or hepatosplenomegaly and peristalsis is normoactive. Extremities have no cyanosis or edema, full range of  motion is present. Skin is warm and dry without rash. Neurologic: Mental status is normal, cranial nerves are intact, there are no motor or sensory deficits.  ED Course  Procedures (including critical care time) Labs Review Results for orders placed during the hospital encounter of 09/07/13  CLOSTRIDIUM DIFFICILE BY PCR      Result Value Ref Range   C difficile by pcr NEGATIVE  NEGATIVE  MRSA PCR SCREENING      Result Value Ref Range   MRSA by PCR NEGATIVE  NEGATIVE  CBC WITH DIFFERENTIAL      Result Value Ref Range   WBC 3.0 (*) 4.0 - 10.5 K/uL   RBC 4.10 (*) 4.22 - 5.81 MIL/uL   Hemoglobin 12.9 (*) 13.0 - 17.0 g/dL   HCT 39.3  39.0 - 52.0 %   MCV 95.9  78.0 - 100.0 fL   MCH 31.5  26.0 - 34.0 pg   MCHC 32.8  30.0 - 36.0 g/dL   RDW 14.1  11.5 - 15.5 %   Platelets 154  150 - 400 K/uL   Neutrophils Relative % 26 (*) 43 - 77 %   Lymphocytes Relative 62 (*) 12 - 46 %   Monocytes Relative 11  3 - 12 %   Eosinophils Relative 1  0 - 5 %   Basophils Relative 0  0 - 1 %   Neutro Abs 0.8 (*) 1.7 - 7.7 K/uL   Lymphs Abs 1.9  0.7 - 4.0 K/uL   Monocytes Absolute 0.3  0.1 - 1.0 K/uL   Eosinophils Absolute 0.0  0.0 - 0.7 K/uL   Basophils Absolute 0.0  0.0 - 0.1 K/uL   RBC Morphology POLYCHROMASIA PRESENT     WBC Morphology ATYPICAL LYMPHOCYTES    BASIC METABOLIC PANEL      Result Value Ref Range   Sodium 140  137 - 147 mEq/L   Potassium 4.8  3.7 - 5.3 mEq/L   Chloride 105  96 - 112 mEq/L   CO2 24  19 - 32 mEq/L   Glucose, Bld 94  70 - 99 mg/dL   BUN 19  6 - 23 mg/dL   Creatinine, Ser 1.14  0.50 - 1.35 mg/dL   Calcium 9.8  8.4 - 10.5 mg/dL   GFR calc non Af Amer 55 (*) >90 mL/min   GFR calc Af Amer 64 (*) >90 mL/min   Anion gap 11  5 - 15  TROPONIN I      Result Value Ref Range   Troponin I <0.30  <0.30 ng/mL  MAGNESIUM      Result Value Ref Range   Magnesium 2.0  1.5 - 2.5 mg/dL  GLUCOSE, CAPILLARY      Result Value Ref Range   Glucose-Capillary 109 (*) 70 - 99 mg/dL   GLUCOSE,  CAPILLARY      Result Value Ref Range   Glucose-Capillary 110 (*) 70 - 99 mg/dL  I-STAT CG4 LACTIC ACID, ED      Result Value Ref Range   Lactic Acid, Venous 0.99  0.5 - 2.2 mmol/L    EKG Interpretation   Date/Time:  Sunday September 07 2013 06:04:15 EDT Ventricular Rate:  60 PR Interval:    QRS Duration: 134 QT Interval:  464 QTC Calculation: 464 R Axis:   -93 Text Interpretation:  Atrial fibrillation RBBB and LAFB When compared with  ECG of 06/26/2013, No significant change was found Confirmed by [facility]  MD,  Noe Goyer ([id number]) on 09/07/2013 6:10:03 AM      MDM   Final diagnoses:  None    Syncope which sounds orthostatic in nature. It certainly seems likely that he is somewhat dehydrated with his diarrhea. Orthostatic vital signs will be checked. Of note, monitor does show atrial fibrillation with some long pauses but review of old records shows similar findings on ECGs in the past.  7:50 AM Orthostatic vital signs showed no significant change in heart rate or blood pressure. Lactic acid level is normal but all the other labs are pending. Monitor does show ongoing atrial fibrillation with times of 2-3 second pauses. You'll probably need admission for ongoing cardiac monitoring. It is noted that he is on a beta blocker and may need some titration of his medications. Case is signed out to Dr. Jeanell Sparrow.   Delora Fuel, MD 97/35/32 [id number]

## 2013-09-08 DIAGNOSIS — I4891 Unspecified atrial fibrillation: Secondary | ICD-10-CM

## 2013-09-08 DIAGNOSIS — I498 Other specified cardiac arrhythmias: Secondary | ICD-10-CM

## 2013-09-08 DIAGNOSIS — I951 Orthostatic hypotension: Secondary | ICD-10-CM | POA: Diagnosis not present

## 2013-09-08 DIAGNOSIS — R55 Syncope and collapse: Secondary | ICD-10-CM | POA: Diagnosis not present

## 2013-09-08 DIAGNOSIS — R197 Diarrhea, unspecified: Secondary | ICD-10-CM | POA: Diagnosis not present

## 2013-09-08 LAB — GLUCOSE, CAPILLARY
Glucose-Capillary: 88 mg/dL (ref 70–99)
Glucose-Capillary: 126 mg/dL — ABNORMAL HIGH (ref 70–99)
Glucose-Capillary: 103 mg/dL — ABNORMAL HIGH (ref 70–99)
Glucose-Capillary: 109 mg/dL — ABNORMAL HIGH (ref 70–99)

## 2013-09-08 LAB — MAGNESIUM: Magnesium: 1.9 mg/dL (ref 1.5–2.5)

## 2013-09-08 LAB — CBC
HEMATOCRIT: 38.2 % — AB (ref 39.0–52.0)
Hemoglobin: 12.9 g/dL — ABNORMAL LOW (ref 13.0–17.0)
MCH: 32.1 pg (ref 26.0–34.0)
MCHC: 33.8 g/dL (ref 30.0–36.0)
MCV: 95 fL (ref 78.0–100.0)
Platelets: 142 10*3/uL — ABNORMAL LOW (ref 150–400)
RBC: 4.02 MIL/uL — ABNORMAL LOW (ref 4.22–5.81)
RDW: 13.9 % (ref 11.5–15.5)
WBC: 3.3 10*3/uL — ABNORMAL LOW (ref 4.0–10.5)

## 2013-09-08 LAB — COMPREHENSIVE METABOLIC PANEL
ALK PHOS: 88 U/L (ref 39–117)
ALT: 17 U/L (ref 0–53)
ANION GAP: 11 (ref 5–15)
AST: 21 U/L (ref 0–37)
Albumin: 3.6 g/dL (ref 3.5–5.2)
BUN: 18 mg/dL (ref 6–23)
CO2: 23 meq/L (ref 19–32)
Calcium: 9.5 mg/dL (ref 8.4–10.5)
Chloride: 105 mEq/L (ref 96–112)
Creatinine, Ser: 1.09 mg/dL (ref 0.50–1.35)
GFR, EST AFRICAN AMERICAN: 67 mL/min — AB (ref >90)
GFR, EST NON AFRICAN AMERICAN: 58 mL/min — AB (ref >90)
GLUCOSE: 97 mg/dL (ref 70–99)
POTASSIUM: 4.8 meq/L (ref 3.7–5.3)
Sodium: 139 mEq/L (ref 137–147)
TOTAL PROTEIN: 6.7 g/dL (ref 6.0–8.3)
Total Bilirubin: 0.3 mg/dL (ref 0.3–1.2)

## 2013-09-08 LAB — PHOSPHORUS: Phosphorus: 3.7 mg/dL (ref 2.3–4.6)

## 2013-09-08 NOTE — Progress Notes (Addendum)
PT AMBULATED W/ WALKER AROUND NURSE'S STATION X1. TOLERATED WELL. NO SOB, DIZZINESS, SYNCOPY, OR CHEST PAIN. HR UP TO 93 DURING AMBULATION. HR REMAINS IN THE 50'S TO 60'S AT REST.

## 2013-09-08 NOTE — Progress Notes (Signed)
First time complaint from patient, stated " he doesn't feel too good"  He got dizzy when he got up to dangle at bedside to void in urinal.  Then proceeded to say it is better than at home but it's still there.  Patient had no other complaints or symptoms of bradarrythmia throughout the night

## 2013-09-08 NOTE — Progress Notes (Signed)
PROGRESS NOTE  Bryce Mcdonald DVV:[id number] DOB: 21-Jul-1921 DOA: 09/07/2013 PCP: PROVIDER NOT IN SYSTEM  HPI: 80 y.o. male has a past medical history significant for atrial fibrillation, hypertension, coronary artery disease, history of DVT and PE, diabetes mellitus, presented to the emergency room with a chief complaint of a syncopal episode.  Subjective/ 24 H Interval events - some symptoms overnight per nursing notes, asymptomatic this morning - he has no complaints, denies chest pain or palpitations  Assessment/Plan: Syncope  - tolerated bolus well, BP better - cardiology consulted, appreciate input Bradycardia - rate as low as 28-30, BB stopped on admission Diarrhea - seems to be better, no sample sent yet for C diff  Chronic systolic and diastolic heart failure - last 2-D echo was done about one month ago that showed an EF of 40-50% with diffuse hypokinesis.  - Holding the ACE inhibitor and beta blocker for now.  Hypothyroidism - we'll resume his home Synthroid. Check TSH.  Diabetes mellitus - most recent hemoglobin A1c was 6.4 about a week ago. Sliding scale insulin.  Hyperlipidemia - resume atorvastatin  Chronic anticoagulation - History of DVTs/PE and atrial fibrillation. Resume rivaroxaban.   Diet: heart Fluids: none DVT Prophylaxis: Xarelto  Code Status: Full Family Communication: none   Disposition Plan: inpatient, home when ready   Consultants:  Cardiology   Procedures:  None    Antibiotics - none   Studies  Filed Vitals:   09/08/13 0400 09/08/13 0500 09/08/13 0619 09/08/13 0800  BP:   130/57 112/44  Pulse:    53  Temp: 98.6 F (37 C)   97.8 F (36.6 C)  TempSrc: Oral   Axillary  Resp:   26 21  Height:      Weight:  77.2 kg (170 lb 3.1 oz)    SpO2:    100%    Intake/Output Summary (Last 24 hours) at 09/08/13 0944 Last data filed at 09/08/13 0900  Gross per 24 hour  Intake    790 ml  Output   1200 ml  Net   -410 ml   Filed Weights     09/07/13 0558 09/08/13 0500  Weight: 80.287 kg (177 lb) 77.2 kg (170 lb 3.1 oz)    Exam:  General:  NAD  Cardiovascular: irregular, bradycardic  Respiratory: CTA biL  Abdomen: soft, NTTp  MSK: no edema  Data Reviewed: Basic Metabolic Panel:  Recent Labs Lab 09/07/13 0610 09/08/13 0457  NA 140 139  K 4.8 4.8  CL 105 105  CO2 24 23  GLUCOSE 94 97  BUN 19 18  CREATININE 1.14 1.09  CALCIUM 9.8 9.5  MG 2.0 1.9  PHOS  --  3.7   Liver Function Tests:  Recent Labs Lab 09/08/13 0457  AST 21  ALT 17  ALKPHOS 88  BILITOT 0.3  PROT 6.7  ALBUMIN 3.6   CBC:  Recent Labs Lab 09/07/13 0610 09/08/13 0457  WBC 3.0* 3.3*  NEUTROABS 0.8*  --   HGB 12.9* 12.9*  HCT 39.3 38.2*  MCV 95.9 95.0  PLT 154 142*   Cardiac Enzymes:  Recent Labs Lab 09/07/13 0610  TROPONINI <0.30   BNP (last 3 results)  Recent Labs  11/10/12 2136 06/26/13 0401 07/26/13 1128  PROBNP 1765.0* 1461.0* 2036.0*   CBG:  Recent Labs Lab 09/07/13 1613 09/07/13 2141 09/08/13 0806  GLUCAP 109* 110* 88    Recent Results (from the past 240 hour(s))  MRSA PCR SCREENING     Status: None  Collection Time    09/07/13 11:41 AM      Result Value Ref Range Status   MRSA by PCR NEGATIVE  NEGATIVE Final   Comment:            The GeneXpert MRSA Assay (FDA     approved for NASAL specimens     only), is one component of a     comprehensive MRSA colonization     surveillance program. It is not     intended to diagnose MRSA     infection nor to guide or     monitor treatment for     MRSA infections.  CLOSTRIDIUM DIFFICILE BY PCR     Status: None   Collection Time    09/07/13  5:30 PM      Result Value Ref Range Status   C difficile by pcr NEGATIVE  NEGATIVE Final     Studies: No results found.  Scheduled Meds: . atorvastatin  80 mg Oral Daily  . famotidine  20 mg Oral BID  . insulin aspart  0-9 Units Subcutaneous TID WC  . levothyroxine  50 mcg Oral QAC breakfast  .  rivaroxaban  20 mg Oral Q supper  . sodium chloride  3 mL Intravenous Q12H   Continuous Infusions:   Active Problems:   HTN (hypertension)   Hyperlipidemia   Atrial fibrillation with slow ventricular response   Chronic anticoagulation   Type 2 diabetes mellitus   Unspecified hypothyroidism   Heart failure   Malnutrition of moderate degree   Diarrhea   Dementia   Syncope   Time spent: 25  This note has been created with Surveyor, quantity. Any transcriptional errors are unintentional.   Marzetta Board, MD [facility] Pager [phone]. If 7 PM - 7 AM, please contact night-coverage at [email or web address], password [facility] 09/08/2013, 9:44 AM  LOS: 1 day

## 2013-09-08 NOTE — Plan of Care (Signed)
Problem: Phase I Progression Outcomes Goal: Pain controlled with appropriate interventions Outcome: Completed/Met Date Met:  09/08/13 No complaints of pain Goal: OOB as tolerated unless otherwise ordered Outcome: Progressing Dangles side of bed to void Goal: Initial discharge plan identified Outcome: Progressing Home with spouse Goal: Hemodynamically stable Outcome: Not Progressing Bradyarrhythmia

## 2013-09-08 NOTE — Care Management Note (Addendum)
    Page 1 of 1   09/09/2013     10:36:32 AM CARE MANAGEMENT NOTE 09/09/2013  Patient:  DUSTAN, HYAMS   Account Number:  [id number]  Date Initiated:  09/08/2013  Documentation initiated by:  Theophilus Kinds  Subjective/Objective Assessment:   Pt admitted from home with dizziness. Pt lives alone and will return home at discharge. Pt stated that he uses a walker and w/c to get around at home. Pt has a daughter who is very active in his care. Pt also has a son who lives in [place]     Action/Plan:   above him. Pt is connected to [facility]. Pt refuses transfer at this time and from was faxed to [facility]. Pt does not know name of PCP at [place]. Pt does have an aide M-F 2 hours a day.   Anticipated DC Date:  09/09/2013   Anticipated DC Plan:  [place]  CM consult      Choice offered to / List presented to:             Status of service:  Completed, signed off Medicare Important Message given?   (If response is "NO", the following Medicare IM given date fields will be blank) Date Medicare IM given:   Medicare IM given by:   Date Additional Medicare IM given:   Additional Medicare IM given by:    Discharge Disposition:  HOME/SELF CARE  Per UR Regulation:    If discussed at Long Length of Stay Meetings, dates discussed:    Comments:  09/09/13 [place], RN BSN CM Pt discharged home today. No CM needs noted.  09/08/13 [place], RN BSN CM

## 2013-09-08 NOTE — Consult Note (Signed)
Primary Physician: Primary Cardiologist:  Pennelope Bracken   HPI:   Bryce Mcdonald is an 80 yo who is admitted for dizziness Followed in clinic  Just seen on 7/16.  Hx of chronic systolic and  diastolic CHF with LVEF 40 to 50%  , Afib, DVT, PE  And dementia  He was orthostatic in clinic on 7/10  Lasix decreased t o20.  Hytrin and isorbid d/c'd   BP was better in clinic on 7/16  Patinet has had diarrhea  He says it comes and goes.  Up to 5 BM per day  He says he has had this for a long time.  Thinks it is the foods he eats that cause it.    On talking to the patient he says he is not dizzy all the time  He has some good days  Spells occur when he is changing position.  Bending to standing.  Not with moving  He denies syncope but his wife says he had one spell with shaking while sitting in chair earlier this year (hosp for) The patient came into ER on 7/20 with dizziness.  Says syncope which patient denies  Says he never lost consciousness.  He denies CP         Past Medical History  Diagnosis Date  . Colon cancer     Status post partial colectomy  . Hypertension   . Hypercholesterolemia   . Blood transfusion   . Coronary artery disease 2011    Non obstructive CAD  . DVT (deep venous thrombosis)   . PE (pulmonary embolism)   . Diabetes mellitus without complication   . A-fib   . Diabetes mellitus, type II   . Ventral hernia   . Right bundle branch block   . Unspecified hypothyroidism   . LVH (left ventricular hypertrophy) 10/10/2012    Ejection fraction 60-65%.  . Bilateral renal cysts   . Calculus of left kidney     non-obstructing  . COPD (chronic obstructive pulmonary disease)     Medications Prior to Admission  Medication Sig Dispense Refill  . albuterol (PROVENTIL HFA) 108 (90 BASE) MCG/ACT inhaler Inhale 2 puffs into the lungs every 6 (six) hours as needed for wheezing or shortness of breath.      Marland Kitchen atorvastatin (LIPITOR) 80 MG tablet Take 80 mg by mouth daily.      .  famotidine (PEPCID) 20 MG tablet Take 1 tablet (20 mg total) by mouth 2 (two) times daily.      . furosemide (LASIX) 20 MG tablet Take 1 tablet (20 mg total) by mouth every morning.  90 tablet  3  . levothyroxine (SYNTHROID, LEVOTHROID) 50 MCG tablet Take 50 mcg by mouth daily before breakfast.      . lisinopril (PRINIVIL,ZESTRIL) 10 MG tablet Take 1 tablet (10 mg total) by mouth daily.      . metoprolol tartrate (LOPRESSOR) 25 MG tablet Take 12.5 mg by mouth 2 (two) times daily.      . rivaroxaban (XARELTO) 20 MG TABS tablet Take 1 tablet (20 mg total) by mouth daily with supper.  30 tablet  3     . atorvastatin  80 mg Oral Daily  . famotidine  20 mg Oral BID  . insulin aspart  0-9 Units Subcutaneous TID WC  . levothyroxine  50 mcg Oral QAC breakfast  . rivaroxaban  20 mg Oral Q supper  . sodium chloride  3 mL Intravenous Q12H    Infusions:  Allergies  Allergen Reactions  . Codeine Shortness Of Breath    Shortness of breath    History   Social History  . Marital Status: Widowed    Spouse Name: N/A    Number of Children: 8  . Years of Education: N/A   Occupational History  . Not on file.   Social History Main Topics  . Smoking status: Former Smoker    Types: Cigarettes  . Smokeless tobacco: Never Used     Comment: quit over 40 years ago  . Alcohol Use: No     Comment: quit over forty years ago  . Drug Use: No  . Sexual Activity: No   Other Topics Concern  . Not on file   Social History Narrative  . No narrative on file    History reviewed. No pertinent family history.  REVIEW OF SYSTEMS:  All systems reviewed  Negative to the above problem except as noted above.    PHYSICAL EXAM: Filed Vitals:   09/08/13 0619  BP: 130/57  Pulse:   Temp:   Resp: 26     Intake/Output Summary (Last 24 hours) at 09/08/13 0846 Last data filed at 09/08/13 0500  Gross per 24 hour  Intake    790 ml  Output   1000 ml  Net   -210 ml    General:  Well appearining 89  in NAD HEENT: normal Neck: supple. no JVD. Carotids 2+ bilat; no bruits. No lymphadenopathy or thryomegaly appreciated. Cor: PMI nondisplaced. Regular rate & rhythm. No rubs, gallops or murmurs. Lungs: coarse BS   Abdomen: soft, nontender, nondistended. No hepatosplenomegaly. No bruits or masses. Good bowel sounds. Extremities: no cyanosis, clubbing, rash, edema Neuro: alert & oriented x 3, cranial nerves grossly intact. moves all 4 extremities w/o difficulty. Affect pleasant.  ECG:  Atrial fib  60  RBBB  LAFB    Results for orders placed during the hospital encounter of 09/07/13 (from the past 24 hour(s))  MRSA PCR SCREENING     Status: None   Collection Time    09/07/13 11:41 AM      Result Value Ref Range   MRSA by PCR NEGATIVE  NEGATIVE  GLUCOSE, CAPILLARY     Status: Abnormal   Collection Time    09/07/13  4:13 PM      Result Value Ref Range   Glucose-Capillary 109 (*) 70 - 99 mg/dL  CLOSTRIDIUM DIFFICILE BY PCR     Status: None   Collection Time    09/07/13  5:30 PM      Result Value Ref Range   C difficile by pcr NEGATIVE  NEGATIVE  GLUCOSE, CAPILLARY     Status: Abnormal   Collection Time    09/07/13  9:41 PM      Result Value Ref Range   Glucose-Capillary 110 (*) 70 - 99 mg/dL  CBC     Status: Abnormal   Collection Time    09/08/13  4:57 AM      Result Value Ref Range   WBC 3.3 (*) 4.0 - 10.5 K/uL   RBC 4.02 (*) 4.22 - 5.81 MIL/uL   Hemoglobin 12.9 (*) 13.0 - 17.0 g/dL   HCT 38.2 (*) 39.0 - 52.0 %   MCV 95.0  78.0 - 100.0 fL   MCH 32.1  26.0 - 34.0 pg   MCHC 33.8  30.0 - 36.0 g/dL   RDW 13.9  11.5 - 15.5 %   Platelets 142 (*) 150 - 400 K/uL  COMPREHENSIVE METABOLIC PANEL     Status: Abnormal   Collection Time    09/08/13  4:57 AM      Result Value Ref Range   Sodium 139  137 - 147 mEq/L   Potassium 4.8  3.7 - 5.3 mEq/L   Chloride 105  96 - 112 mEq/L   CO2 23  19 - 32 mEq/L   Glucose, Bld 97  70 - 99 mg/dL   BUN 18  6 - 23 mg/dL   Creatinine, Ser 1.09   0.50 - 1.35 mg/dL   Calcium 9.5  8.4 - 10.5 mg/dL   Total Protein 6.7  6.0 - 8.3 g/dL   Albumin 3.6  3.5 - 5.2 g/dL   AST 21  0 - 37 U/L   ALT 17  0 - 53 U/L   Alkaline Phosphatase 88  39 - 117 U/L   Total Bilirubin 0.3  0.3 - 1.2 mg/dL   GFR calc non Af Amer 58 (*) >90 mL/min   GFR calc Af Amer 67 (*) >90 mL/min   Anion gap 11  5 - 15  MAGNESIUM     Status: None   Collection Time    09/08/13  4:57 AM      Result Value Ref Range   Magnesium 1.9  1.5 - 2.5 mg/dL  PHOSPHORUS     Status: None   Collection Time    09/08/13  4:57 AM      Result Value Ref Range   Phosphorus 3.7  2.3 - 4.6 mg/dL  GLUCOSE, CAPILLARY     Status: None   Collection Time    09/08/13  8:06 AM      Result Value Ref Range   Glucose-Capillary 88  70 - 99 mg/dL   No results found.   ASSESSMENT: Patient is an 80 yo whi presents with ?syncope  He says he was dizzy  Denies syncope  Spells occur with change in position.   He is bradycardic though with no signif pauses.  Lopressor 12.5 bid was held on admit  I asked the patient to stand/bend when I examined him  He was asymptomatic.  I think he does have orthostatic tendencies that have been documented.  Spells and history correl with this. I have not seen demonstrated bradycardia that is associated with symptoms  Need to demonstrated    BP is adequate off of meds  I would get patient ambulating and follow HR and symptoms   CHeck orthostatics.    Will follow.

## 2013-09-08 NOTE — Progress Notes (Signed)
Utilization review completed.  

## 2013-09-09 DIAGNOSIS — I5033 Acute on chronic diastolic (congestive) heart failure: Secondary | ICD-10-CM

## 2013-09-09 DIAGNOSIS — R55 Syncope and collapse: Secondary | ICD-10-CM | POA: Diagnosis not present

## 2013-09-09 DIAGNOSIS — I4891 Unspecified atrial fibrillation: Secondary | ICD-10-CM | POA: Diagnosis not present

## 2013-09-09 DIAGNOSIS — I509 Heart failure, unspecified: Secondary | ICD-10-CM | POA: Diagnosis not present

## 2013-09-09 LAB — BASIC METABOLIC PANEL
ANION GAP: 9 (ref 5–15)
BUN: 18 mg/dL (ref 6–23)
CHLORIDE: 104 meq/L (ref 96–112)
CO2: 28 mEq/L (ref 19–32)
Calcium: 10 mg/dL (ref 8.4–10.5)
Creatinine, Ser: 1.12 mg/dL (ref 0.50–1.35)
GFR calc Af Amer: 65 mL/min — ABNORMAL LOW (ref >90)
GFR, EST NON AFRICAN AMERICAN: 56 mL/min — AB (ref >90)
Glucose, Bld: 99 mg/dL (ref 70–99)
POTASSIUM: 4.9 meq/L (ref 3.7–5.3)
SODIUM: 141 meq/L (ref 137–147)

## 2013-09-09 LAB — GLUCOSE, CAPILLARY
GLUCOSE-CAPILLARY: 94 mg/dL (ref 70–99)
Glucose-Capillary: 82 mg/dL (ref 70–99)

## 2013-09-09 LAB — CBC
HEMATOCRIT: 40.9 % (ref 39.0–52.0)
Hemoglobin: 13.5 g/dL (ref 13.0–17.0)
MCH: 31.3 pg (ref 26.0–34.0)
MCHC: 33 g/dL (ref 30.0–36.0)
MCV: 94.9 fL (ref 78.0–100.0)
Platelets: 148 10*3/uL — ABNORMAL LOW (ref 150–400)
RBC: 4.31 MIL/uL (ref 4.22–5.81)
RDW: 13.6 % (ref 11.5–15.5)
WBC: 3.1 10*3/uL — ABNORMAL LOW (ref 4.0–10.5)

## 2013-09-09 MED ORDER — FUROSEMIDE 20 MG PO TABS: 20.0000 mg | ORAL_TABLET | Freq: Every day | ORAL | Status: DC | PRN

## 2013-09-09 NOTE — Progress Notes (Signed)
Pt a/o.vss. Saline lock removed. No complaints of any distress. Discharge instructions given and discussed with pt daughter. The daughter verbalized understanding of instructions. Pt left floor via wheelchair with family and nursing staff.

## 2013-09-09 NOTE — Discharge Summary (Signed)
Physician Discharge Summary  Bryce Mcdonald VVZ:[id number] DOB: February 16, 1922 DOA: 09/07/2013  PCP: PROVIDER NOT IN SYSTEM  Admit date: 09/07/2013 Discharge date: 09/09/2013  Time spent: 35 minutes  Recommendations for Outpatient Follow-up:  1. Follow up with Cardiology on 7/31 2. Follow up with PCP in 1 week  Discharge Diagnoses:  Active Problems:   HTN (hypertension)   Hyperlipidemia   Atrial fibrillation with slow ventricular response   Chronic anticoagulation   Type 2 diabetes mellitus   Unspecified hypothyroidism   Heart failure   Malnutrition of moderate degree   Diarrhea   Dementia   Syncope  Discharge Condition: stable  Diet recommendation: heart healthy  Filed Weights   09/07/13 0558 09/08/13 0500 09/09/13 0500  Weight: 80.287 kg (177 lb) 77.2 kg (170 lb 3.1 oz) 77.2 kg (170 lb 3.1 oz)   History of present illness:  Bryce Mcdonald is a 80 y.o. male has a past medical history significant for atrial fibrillation, hypertension, coronary artery disease, history of DVT and PE, diabetes mellitus, presented to the emergency room with a chief complaint of a syncopal episode this morning. Patient states that he will call early a.m. to go to the bathroom when he passed out. He denies head injury. This was all of a sudden. He also endorses having diarrhea for the past couple of days. He does have a component of chronic diarrhea, however it has really pick up in the last 2 days, yesterday he had 5 or 6 episodes of loose stools. He has a history of recurrent syncopal episodes, has been seen by cardiology as an outpatient, and his medications have been adjusted and discontinued, however he continued to have syncopal episodes. In the emergency room, patient is orthostatic. TR[facility] asked for admission for further evaluation. Patient is somewhat unreliable, but tells me that he has no chest pain or breathing difficulties, no nausea or vomiting but does have diarrhea. He denies any  lightheadedness or dizziness. He denies any fevers or chills at home.  Hospital Course:  Patient was admitted to telemetry with a syncopal episode. He was found to be dehydrated and orthostatic and received IVF which he tolerates well given his history of chronic systolic and diastolic heart failure. He was noted to be bradycardic on telemetry, however patient was asymptomatic. His Lasix, Lisinopril and his beta blocker were held at admission with significant improvement in his overall symptoms. Cardiology has been consulted and followed patient while hospitalized. Patient was able to walk in the hallway without dizziness, chest pain, lightheadedness or further syncopal or pre-syncopal episodes and his heart rate has improved off of his beta blocker. He was cleared by cardiology to be discharged home with close follow up as an outpatient. His ACEI, BB have been discontinued and his Lasix was changed to as needed based on his daily weights.   Procedures:  None    Consultations:  Cardiology   Discharge Exam: BP- Lying 150/92  Pulse- Lying 59 BP- Sitting 121/77  Pulse- Sitting 58  BP- Standing at 0 minutes 140/72 Pulse- Standing at 0 minutes 77  BP- Standing at 3 minutes 161/71 Pulse- Standing at 3 minutes 74  General: NAD Cardiovascular: irregular Respiratory: CTA biL  Discharge Instructions     Medication List    STOP taking these medications       lisinopril 10 MG tablet  Commonly known as:  PRINIVIL,ZESTRIL     metoprolol tartrate 25 MG tablet  Commonly known as:  [employer]  TAKE these medications       atorvastatin 80 MG tablet  Commonly known as:  LIPITOR  Take 80 mg by mouth daily.     famotidine 20 MG tablet  Commonly known as:  PEPCID  Take 1 tablet (20 mg total) by mouth 2 (two) times daily.     furosemide 20 MG tablet  Commonly known as:  LASIX  Take 1 tablet (20 mg total) by mouth daily as needed for fluid or edema.     levothyroxine 50 MCG tablet    Commonly known as:  SYNTHROID, LEVOTHROID  Take 50 mcg by mouth daily before breakfast.     PROVENTIL HFA 108 (90 BASE) MCG/ACT inhaler  Generic drug:  albuterol  Inhale 2 puffs into the lungs every 6 (six) hours as needed for wheezing or shortness of breath.     rivaroxaban 20 MG Tabs tablet  Commonly known as:  XARELTO  Take 1 tablet (20 mg total) by mouth daily with supper.           Follow-up Information   Follow up with Jory Sims, NP. Schedule an appointment as soon as possible for a visit in 1 week.   Specialty:  Nurse Practitioner   Contact information:   [address] [place] [address] [phone]       The results of significant diagnostics from this hospitalization (including imaging, microbiology, ancillary and laboratory) are listed below for reference.    Significant Diagnostic Studies: Nm Myocar Single W/spect W/wall Motion And Ef  08/27/2013   CLINICAL DATA:  80 year old male with history of atrial fibrillation, CAD, hypertension, pulmonary embolus, and cardiomyopathy with LVEF 40-50%. This study is requested to evaluate for the presence and extent of ischemia.  EXAM: MYOCARDIAL IMAGING WITH SPECT (REST AND PHARMACOLOGIC-STRESS)  GATED LEFT VENTRICULAR WALL MOTION STUDY  LEFT VENTRICULAR EJECTION FRACTION  TECHNIQUE: Standard myocardial SPECT imaging was performed after resting intravenous injection of 10 mCi Tc-75m sestamibi. Subsequently, intravenous infusion of Lexiscan was performed under the supervision of the Cardiology staff. At peak effect of the drug, 30 mCi Tc-57m sestamibi was injected intravenously and standard myocardial SPECT imaging was performed. Quantitative gated imaging was also performed to evaluate left ventricular wall motion, and estimate left ventricular ejection fraction.  FINDINGS: Baseline tracing shows atrial fibrillation at 68 beats per min with right bundle branch block and left anterior fascicular block. Lexiscan bolus was  given in standard fashion. Heart rate increased from 67 beats per min up to 91 beats per min, and blood pressure remained stable at 117/70. No chest pain was reported. There were no diagnostic ST segment abnormalities.  Analysis of the overall perfusion data shows adequate radiotracer uptake with diaphragmatic attenuation.  Tomographic views were obtained using the short axis, vertical long axis, and horizontal long axis planes. There is a moderate size, severe intensity, inferior wall defect extending from apex to base. This region is fixed and suggestive of scar, no large ischemic zones are noted.  Gated imaging reveals an EDV of 82, ESV of 51, LVEF of 37%, and TID ratio 1.05. There is diffuse hypokinesis  IMPRESSION: Low to intermediate risk Lexiscan Cardiolite. There were no diagnostic ST segment abnormalities, atrial fibrillation present throughout. Perfusion imaging is most consistent with scar in the inferior wall without any large ischemic territories. LVEF is calculated at 37% with fairly diffuse hypokinesis and normal volumes.   Electronically Signed   By: Rozann Lesches M.D.   On: 08/27/2013 15:11  Microbiology: Recent Results (from the past 240 hour(s))  MRSA PCR SCREENING     Status: None   Collection Time    09/07/13 11:41 AM      Result Value Ref Range Status   MRSA by PCR NEGATIVE  NEGATIVE Final   Comment:            The GeneXpert MRSA Assay (FDA     approved for NASAL specimens     only), is one component of a     comprehensive MRSA colonization     surveillance program. It is not     intended to diagnose MRSA     infection nor to guide or     monitor treatment for     MRSA infections.  CLOSTRIDIUM DIFFICILE BY PCR     Status: None   Collection Time    09/07/13  5:30 PM      Result Value Ref Range Status   C difficile by pcr NEGATIVE  NEGATIVE Final     Labs: Basic Metabolic Panel:  Recent Labs Lab 09/07/13 0610 09/08/13 0457 09/09/13 0712  NA 140 139 141  K  4.8 4.8 4.9  CL 105 105 104  CO2 24 23 28   GLUCOSE 94 97 99  BUN 19 18 18   CREATININE 1.14 1.09 1.12  CALCIUM 9.8 9.5 10.0  MG 2.0 1.9  --   PHOS  --  3.7  --    Liver Function Tests:  Recent Labs Lab 09/08/13 0457  AST 21  ALT 17  ALKPHOS 88  BILITOT 0.3  PROT 6.7  ALBUMIN 3.6   No results found for this basename: LIPASE, AMYLASE,  in the last 168 hours No results found for this basename: AMMONIA,  in the last 168 hours CBC:  Recent Labs Lab 09/07/13 0610 09/08/13 0457 09/09/13 0712  WBC 3.0* 3.3* 3.1*  NEUTROABS 0.8*  --   --   HGB 12.9* 12.9* 13.5  HCT 39.3 38.2* 40.9  MCV 95.9 95.0 94.9  PLT 154 142* 148*   Cardiac Enzymes:  Recent Labs Lab 09/07/13 0610  TROPONINI <0.30   BNP: BNP (last 3 results)  Recent Labs  11/10/12 2136 06/26/13 0401 07/26/13 1128  PROBNP 1765.0* 1461.0* 2036.0*   CBG:  Recent Labs Lab 09/08/13 1220 09/08/13 1623 09/08/13 2135 09/09/13 0814 09/09/13 1156  GLUCAP 126* 103* 109* 82 94       Signed:  GHERGHE, COSTIN  [facility] 09/09/2013, 5:14 PM

## 2013-09-09 NOTE — Progress Notes (Signed)
Subjective: Denies dizziness.  No CP   Objective: Filed Vitals:   09/09/13 0200 09/09/13 0300 09/09/13 0400 09/09/13 0500  BP:   132/52   Pulse: 37 57 35   Temp:   98.1 F (36.7 C)   TempSrc:   Oral   Resp: 16 20 19    Height:      Weight:    170 lb 3.1 oz (77.2 kg)  SpO2: 92% 97% 94%    Weight change: 0 lb (0 kg)  Intake/Output Summary (Last 24 hours) at 09/09/13 0835 Last data filed at 09/09/13 0800  Gross per 24 hour  Intake    240 ml  Output   1825 ml  Net  -1585 ml    General: Alert, awake, oriented x3, in no acute distress Neck:  JVP is normal Heart: Regular rate and rhythm, without murmurs, rubs, gallops.  Lungs: MOving air  Rhonchi Exemities:  No edema.   Neuro: Grossly intact, nonfocal.  Tele:  Afib 40s to 60s    No signif pauses     Lab Results: Results for orders placed during the hospital encounter of 09/07/13 (from the past 24 hour(s))  GLUCOSE, CAPILLARY     Status: Abnormal   Collection Time    09/08/13 12:20 PM      Result Value Ref Range   Glucose-Capillary 126 (*) 70 - 99 mg/dL  GLUCOSE, CAPILLARY     Status: Abnormal   Collection Time    09/08/13  4:23 PM      Result Value Ref Range   Glucose-Capillary 103 (*) 70 - 99 mg/dL   Comment 1 Documented in Chart     Comment 2 Notify RN    GLUCOSE, CAPILLARY     Status: Abnormal   Collection Time    09/08/13  9:35 PM      Result Value Ref Range   Glucose-Capillary 109 (*) 70 - 99 mg/dL   Comment 1 Notify RN    BASIC METABOLIC PANEL     Status: Abnormal   Collection Time    09/09/13  7:12 AM      Result Value Ref Range   Sodium 141  137 - 147 mEq/L   Potassium 4.9  3.7 - 5.3 mEq/L   Chloride 104  96 - 112 mEq/L   CO2 28  19 - 32 mEq/L   Glucose, Bld 99  70 - 99 mg/dL   BUN 18  6 - 23 mg/dL   Creatinine, Ser 1.12  0.50 - 1.35 mg/dL   Calcium 10.0  8.4 - 10.5 mg/dL   GFR calc non Af Amer 56 (*) >90 mL/min   GFR calc Af Amer 65 (*) >90 mL/min   Anion gap 9  5 - 15  CBC     Status: Abnormal   Collection Time    09/09/13  7:12 AM      Result Value Ref Range   WBC 3.1 (*) 4.0 - 10.5 K/uL   RBC 4.31  4.22 - 5.81 MIL/uL   Hemoglobin 13.5  13.0 - 17.0 g/dL   HCT 40.9  39.0 - 52.0 %   MCV 94.9  78.0 - 100.0 fL   MCH 31.3  26.0 - 34.0 pg   MCHC 33.0  30.0 - 36.0 g/dL   RDW 13.6  11.5 - 15.5 %   Platelets 148 (*) 150 - 400 K/uL  GLUCOSE, CAPILLARY     Status: None   Collection Time    09/09/13  8:14 AM  Result Value Ref Range   Glucose-Capillary 82  70 - 99 mg/dL    Studies/Results: No results found.  Medications: Reviewed   @PROBHOSP @  1.  Syncope   Patient does deny losing consciousness.    No episodes of dizziness yesterday with walking or standing  HR has improved off of b blocker.  No evid to support need for PPM Would recheck orhtostatics one more time.   Consider outpatient monitor if continues to have .   2  Atrial fib.  On Xarelto   Will need to continue to revisit if he does pass out  3.  Acoute on chronic systolic CHF  Volume status does not look bad on current regimen.  Again, BP limits Rx.     LOS: 2 days   Dorris Carnes 09/09/2013, 8:35 AM

## 2013-09-09 NOTE — Discharge Instructions (Signed)
Heart failure  Discharge instructions  DIET  Low sodium Heart Healthy Diet with fluid restriction 1500cc/day  ACTIVITY  Avoid strenuous activity  WEIGH DAILY AT SAME TIME . CALL YOUR DOCTOR IF WEIGHT INCREASES BY MORE THAN 3  POUNDS IN 2 DAYS  CALL YOUR DOCTOR OR COME TO EMERGENCY ROOM IF WORSENING SHORTNESS OF BREATH OR  CHEST PAIN OR SWELLING   F/U with PMD in 1 week to recheck labs and adjust fluid pills as needed  If you smoke cigarettes or use any tobacco products you are advised to stop. Please ask nurse for any written materials  Or additional information you want regarding smoking cessation  You were cared for by a hospitalist during your hospital stay. If you have any questions about your discharge medications or the care you received while you were in the hospital after you are discharged, you can call the unit and asked to speak with the hospitalist on call if the hospitalist that took care of you is not available. Once you are discharged, your primary care physician will handle any further medical issues. Please note that NO REFILLS for any discharge medications will be authorized once you are discharged, as it is imperative that you return to your primary care physician (or establish a relationship with a primary care physician if you do not have one) for your aftercare needs so that they can reassess your need for medications and monitor your lab values.     If you do not have a primary care physician, you can call [phone] for a physician referral.  Follow with Primary MD in 5-7 days   Get CBC, CMP checked by your doctor and again as further instructed.  Get a 2 view Chest X ray done next visit if you had Pneumonia of Lung problems at the [facility] reviewed and adjusted.  Please request your Prim.MD to go over all Hospital Tests and Procedure/Radiological results at the follow up, please get all Hospital records sent to your Prim MD by signing hospital release  before you go home.  Activity: As tolerated with Full fall precautions use walker/cane & assistance as needed  Diet: low salt, heart healthy, fluid restricted  For Heart failure patients - Check your Weight same time everyday, if you gain over 2 pounds, or you develop in leg swelling, experience more shortness of breath or chest pain, call your Primary MD immediately. Follow Cardiac Low Salt Diet and 1.8 lit/day fluid restriction.  Disposition Home  If you experience worsening of your admission symptoms, develop shortness of breath, life threatening emergency, suicidal or homicidal thoughts you must seek medical attention immediately by calling 911 or calling your MD immediately  if symptoms less severe.  You Must read complete instructions/literature along with all the possible adverse reactions/side effects for all the Medicines you take and that have been prescribed to you. Take any new Medicines after you have completely understood and accpet all the possible adverse reactions/side effects.   Do not drive and provide baby sitting services if your were admitted for syncope or siezures until you have seen by Primary MD or a Neurologist and advised to do so again.  Do not drive when taking Pain medications.   Do not take more than prescribed Pain, Sleep and Anxiety Medications  Special Instructions: If you have smoked or chewed Tobacco  in the last 2 yrs please stop smoking, stop any regular Alcohol  and or any Recreational drug use.  Wear Seat belts  while driving.

## 2013-09-11 LAB — TSH: TSH: 3.87 u[IU]/mL (ref 0.350–4.500)

## 2013-09-19 ENCOUNTER — Encounter: Payer: Non-veteran care | Admitting: Adult Health

## 2013-09-22 ENCOUNTER — Encounter: Payer: Self-pay | Admitting: Adult Health

## 2013-09-22 ENCOUNTER — Ambulatory Visit (INDEPENDENT_AMBULATORY_CARE_PROVIDER_SITE_OTHER): Payer: Medicare Other | Admitting: Adult Health

## 2013-09-22 VITALS — BP 118/60 | HR 68 | Ht 68.0 in | Wt 172.0 lb

## 2013-09-22 DIAGNOSIS — I4891 Unspecified atrial fibrillation: Secondary | ICD-10-CM

## 2013-09-22 DIAGNOSIS — I10 Essential (primary) hypertension: Secondary | ICD-10-CM

## 2013-09-22 DIAGNOSIS — I2589 Other forms of chronic ischemic heart disease: Secondary | ICD-10-CM

## 2013-09-22 DIAGNOSIS — I509 Heart failure, unspecified: Secondary | ICD-10-CM

## 2013-09-22 DIAGNOSIS — I5033 Acute on chronic diastolic (congestive) heart failure: Secondary | ICD-10-CM

## 2013-09-22 NOTE — Assessment & Plan Note (Signed)
Blood pressure is currently controlled. He is been taken off of ACE inhibitor, beta blocker, and only uses Lasix when necessary now. He has increased his fluid avoid dehydration. His sister who is his main caretaker checks on him often but he does live alone. She make sure that his medications are iron out for him for daily dosing.

## 2013-09-22 NOTE — Assessment & Plan Note (Signed)
No evidence of fluid retention and decompensation. Continue use of Lasix when necessary only.

## 2013-09-22 NOTE — Patient Instructions (Signed)
Your physician recommends that you schedule a follow-up appointment in: 6 months with Dr Koneswaran You will receive a reminder letter two months in advance reminding you to call and schedule your appointment. If you don't receive this letter, please contact our office.  Your physician recommends that you continue on your current medications as directed. Please refer to the Current Medication list given to you today.   

## 2013-09-22 NOTE — Assessment & Plan Note (Signed)
Heart rate is 68 beats per minute without any rate controlling medications. He has had episodes in the past of atrial fib with RVR. On recent hospitalization this was not indicated on his discharge summary or discussion of symptoms during hospitalization. Continues on Xarelto 20 mg daily without evidence of bleeding. The patient will be followed up in our office in 3 months for ongoing assessment and management. He has recurrent syncopal episode he may need to have a cardiac monitor placed to evaluate for tachybrady syndrome, vs. repeated episodes of atrial fib with RVR becoming symptomatic.

## 2013-09-22 NOTE — Progress Notes (Deleted)
Name: Bryce Mcdonald    DOB: November 01, 1921  Age: 80 y.o.  MR#: [id number]       PCP:  PROVIDER NOT IN SYSTEM      Insurance: Payor: MEDICARE / Plan: MEDICARE PART B / Product Type: *No Product type* /   CC:    Chief Complaint  Patient presents with  . Hypertension    VS Filed Vitals:   09/22/13 1456  BP: 118/60  Pulse: 68  Height: 5\' 8"  (1.727 m)  Weight: 172 lb (78.019 kg)  SpO2: 99%    Weights Current Weight  09/22/13 172 lb (78.019 kg)  09/09/13 170 lb 3.1 oz (77.2 kg)  09/04/13 174 lb (78.926 kg)    Blood Pressure  BP Readings from Last 3 Encounters:  09/22/13 118/60  09/09/13 132/52  09/04/13 140/68     Admit date:  (Not on file) Last encounter with RMR:  09/04/2013   Allergy Codeine  Current Outpatient Prescriptions  Medication Sig Dispense Refill  . albuterol (PROVENTIL HFA) 108 (90 BASE) MCG/ACT inhaler Inhale 2 puffs into the lungs every 6 (six) hours as needed for wheezing or shortness of breath.      Marland Kitchen atorvastatin (LIPITOR) 80 MG tablet Take 80 mg by mouth daily.      . famotidine (PEPCID) 20 MG tablet Take 1 tablet (20 mg total) by mouth 2 (two) times daily.      . furosemide (LASIX) 20 MG tablet Take 1 tablet (20 mg total) by mouth daily as needed for fluid or edema.  90 tablet  3  . levothyroxine (SYNTHROID, LEVOTHROID) 50 MCG tablet Take 50 mcg by mouth daily before breakfast.      . rivaroxaban (XARELTO) 20 MG TABS tablet Take 1 tablet (20 mg total) by mouth daily with supper.  30 tablet  3   No current facility-administered medications for this visit.    Discontinued Meds:   There are no discontinued medications.  Patient Active Problem List   Diagnosis Date Noted  . Syncope 09/07/2013  . Dementia 09/04/2013  . Dyspnea 07/26/2013  . Acute respiratory failure 07/26/2013  . Acute on chronic diastolic CHF (congestive heart failure) 07/26/2013  . Malnutrition of moderate degree 12/06/2012  . Diarrhea 12/06/2012  . Calculus of left kidney  12/05/2012  . Bilateral renal cysts 12/05/2012  . Acute renal failure 12/04/2012  . Nausea alone 12/04/2012  . History of colon cancer 12/04/2012  . Abdominal pain, left lower quadrant 12/04/2012  . Heart failure 11/11/2012  . LVH (left ventricular hypertrophy) 10/11/2012  . DVT, bilateral lower limbs 10/10/2012  . Dysphagia 10/09/2012  . History of pulmonary embolism 10/09/2012  . Chronic anticoagulation 10/09/2012  . Leukopenia 10/09/2012  . Type 2 diabetes mellitus 10/09/2012  . Right bundle branch block 10/09/2012  . Unspecified hypothyroidism 10/09/2012  . Anemia 10/09/2012  . Atrial fibrillation with slow ventricular response 09/30/2012  . UTI (lower urinary tract infection) 10/16/2010  . HTN (hypertension) 10/16/2010  . Prostate cancer 10/16/2010  . Hyperlipidemia 10/16/2010  . Ventral hernia 10/16/2010    LABS    Component Value Date/Time   NA 141 09/09/2013 0712   NA 139 09/08/2013 0457   NA 140 09/07/2013 0610   K 4.9 09/09/2013 0712   K 4.8 09/08/2013 0457   K 4.8 09/07/2013 0610   CL 104 09/09/2013 0712   CL 105 09/08/2013 0457   CL 105 09/07/2013 0610   CO2 28 09/09/2013 0712   CO2 23 09/08/2013 0457   CO2  24 09/07/2013 0610   GLUCOSE 99 09/09/2013 0712   GLUCOSE 97 09/08/2013 0457   GLUCOSE 94 09/07/2013 0610   BUN 18 09/09/2013 0712   BUN 18 09/08/2013 0457   BUN 19 09/07/2013 0610   CREATININE 1.12 09/09/2013 0712   CREATININE 1.09 09/08/2013 0457   CREATININE 1.14 09/07/2013 0610   CREATININE 1.37* 08/29/2013 1643   CALCIUM 10.0 09/09/2013 0712   CALCIUM 9.5 09/08/2013 0457   CALCIUM 9.8 09/07/2013 0610   GFRNONAA 56* 09/09/2013 0712   GFRNONAA 58* 09/08/2013 0457   GFRNONAA 55* 09/07/2013 0610   GFRAA 65* 09/09/2013 0712   GFRAA 67* 09/08/2013 0457   GFRAA 64* 09/07/2013 0610   CMP     Component Value Date/Time   NA 141 09/09/2013 0712   K 4.9 09/09/2013 0712   CL 104 09/09/2013 0712   CO2 28 09/09/2013 0712   GLUCOSE 99 09/09/2013 0712   BUN 18 09/09/2013 0712    CREATININE 1.12 09/09/2013 0712   CREATININE 1.37* 08/29/2013 1643   CALCIUM 10.0 09/09/2013 0712   PROT 6.7 09/08/2013 0457   ALBUMIN 3.6 09/08/2013 0457   AST 21 09/08/2013 0457   ALT 17 09/08/2013 0457   ALKPHOS 88 09/08/2013 0457   BILITOT 0.3 09/08/2013 0457   GFRNONAA 56* 09/09/2013 0712   GFRAA 65* 09/09/2013 0712       Component Value Date/Time   WBC 3.1* 09/09/2013 0712   WBC 3.3* 09/08/2013 0457   WBC 3.0* 09/07/2013 0610   HGB 13.5 09/09/2013 0712   HGB 12.9* 09/08/2013 0457   HGB 12.9* 09/07/2013 0610   HCT 40.9 09/09/2013 0712   HCT 38.2* 09/08/2013 0457   HCT 39.3 09/07/2013 0610   MCV 94.9 09/09/2013 0712   MCV 95.0 09/08/2013 0457   MCV 95.9 09/07/2013 0610    Lipid Panel     Component Value Date/Time   CHOL 106 12/04/2012 0930   TRIG 120 12/04/2012 0930   HDL 27* 12/04/2012 0930   CHOLHDL 3.9 12/04/2012 0930   VLDL 24 12/04/2012 0930   LDLCALC 55 12/04/2012 0930    ABG    Component Value Date/Time   PHART 7.407 12/29/2012 1055   PCO2ART 36.9 12/29/2012 1055   PO2ART 79.3* 12/29/2012 1055   HCO3 22.8 12/29/2012 1055   TCO2 21.1 12/29/2012 1055   ACIDBASEDEF 1.3 12/29/2012 1055   O2SAT 93.9 12/29/2012 1055     Lab Results  Component Value Date   TSH 3.870 09/07/2013   BNP (last 3 results)  Recent Labs  11/10/12 2136 06/26/13 0401 07/26/13 1128  PROBNP 1765.0* 1461.0* 2036.0*   Cardiac Panel (last 3 results) No results found for this basename: CKTOTAL, CKMB, TROPONINI, RELINDX,  in the last 72 hours  Iron/TIBC/Ferritin/ %Sat    Component Value Date/Time   IRON 71 10/10/2012 0224   TIBC 239 10/10/2012 0224   FERRITIN 313 10/10/2012 0224   IRONPCTSAT 30 10/10/2012 0224     EKG Orders placed during the hospital encounter of 09/07/13  . EKG 12-LEAD  . EKG 12-LEAD  . EKG 12-LEAD  . EKG 12-LEAD  . EKG     Prior Assessment and Plan Problem List as of 09/22/2013     Cardiovascular and Mediastinum   HTN (hypertension)   Last Assessment & Plan   09/04/2013  Office Visit Written 09/04/2013  4:17 PM by Lendon Colonel, NP     Patient blood pressures improved with medication choices on last visit. It could be a little  at 130/65 range. I am happy with his blood pressure today as it is substantially improved from orthostatics dropping him in the 42J systolically. He is asymptomatic now with no further dizziness.    Atrial fibrillation with slow ventricular response   Last Assessment & Plan   08/29/2013 Office Visit Written 08/29/2013  4:32 PM by Lendon Colonel, NP     Continues on Xarelto, denies any bleeding. Heart rate is well-controlled but does drop significantly during orthostatic blood pressures pulse 73 down to 47 beats per minute. He will continue him on metoprolol for now in hopes that once the other medication adjustments are made he will no longer have issues with dizziness.    Right bundle branch block   DVT, bilateral lower limbs   LVH (left ventricular hypertrophy)   Heart failure   Acute on chronic diastolic CHF (congestive heart failure)   Last Assessment & Plan   09/04/2013 Office Visit Written 09/04/2013  4:14 PM by Lendon Colonel, NP     No evidence of decompensation with lower dose of Lasix. Breathing status appears stable. He has not gained weight since being seen last. We will continue on lower dose of Lasix to prevent hypotension.    Syncope     Respiratory   Acute respiratory failure     Digestive   Dysphagia     Endocrine   Type 2 diabetes mellitus   Last Assessment & Plan   09/04/2013 Office Visit Written 09/04/2013  4:16 PM by Lendon Colonel, NP     He is advised to be seen by a primary care physician for ongoing management of this. He is normally followed by the [place], but has not been est. with a new [facility]. I have advised him to seek a primary care physician here locally, as he is not being seen by the [facility] again and tell 3-4 months.    Unspecified hypothyroidism     Nervous and Auditory   Dementia    Last Assessment & Plan   09/04/2013 Office Visit Written 09/04/2013  4:19 PM by Lendon Colonel, NP     Patient is having frequent confused thoughts, rambling thoughts, unable to concentrate on questions. He has also had reported getting in his car in night and driving becoming lost and weak. I have asked his family member to not allow him to drive take his keys in his car from him. They are advised to bring him to appointments, and church, as he is adamant about attending.  I have asked him to be est. with a primary care physician locally. He may need to have a life watch placed, for further monitoring and also if he begins to wonder, he can be easily found. This can be started by his primary care physician. As I will defer medical management to him.      Genitourinary   Prostate cancer   UTI (lower urinary tract infection)   Acute renal failure   Last Assessment & Plan   09/04/2013 Office Visit Written 09/04/2013  4:15 PM by Lendon Colonel, NP     Review of recent labs completed post office visit last week demonstrates renal function creatinine 1.37 this should improve also with decreased dose of Lasix.    Calculus of left kidney   Bilateral renal cysts     Other   Hyperlipidemia   Last Assessment & Plan   10/25/2012 Office Visit Written 10/25/2012  1:42 PM by Lendon Colonel, NP  He continues on simvastatin as directed. He is followed by PCP for ongoing labs.    Ventral hernia   History of pulmonary embolism   Chronic anticoagulation   Last Assessment & Plan   10/25/2012 Office Visit Written 10/25/2012  1:44 PM by Lendon Colonel, NP     Remains on LMWH as directed for PE and DVT. He is medically compliant.    Leukopenia   Anemia   Last Assessment & Plan   08/29/2013 Office Visit Written 08/29/2013  4:31 PM by Lendon Colonel, NP     CBC is completed today. He denies any bleeding with use of Xarelto, but we will check nonetheless due to his significant orthostatic  hypotension and dizziness    Nausea alone   History of colon cancer   Abdominal pain, left lower quadrant   Malnutrition of moderate degree   Diarrhea   Last Assessment & Plan   08/29/2013 Office Visit Written 08/29/2013  4:31 PM by Lendon Colonel, NP     He will have a stool sample checked to evaluate for C. difficile.    Dyspnea       Imaging: Nm Myocar Single W/spect W/wall Motion And Ef  08/27/2013   CLINICAL DATA:  80 year old male with history of atrial fibrillation, CAD, hypertension, pulmonary embolus, and cardiomyopathy with LVEF 40-50%. This study is requested to evaluate for the presence and extent of ischemia.  EXAM: MYOCARDIAL IMAGING WITH SPECT (REST AND PHARMACOLOGIC-STRESS)  GATED LEFT VENTRICULAR WALL MOTION STUDY  LEFT VENTRICULAR EJECTION FRACTION  TECHNIQUE: Standard myocardial SPECT imaging was performed after resting intravenous injection of 10 mCi Tc-47m sestamibi. Subsequently, intravenous infusion of Lexiscan was performed under the supervision of the Cardiology staff. At peak effect of the drug, 30 mCi Tc-45m sestamibi was injected intravenously and standard myocardial SPECT imaging was performed. Quantitative gated imaging was also performed to evaluate left ventricular wall motion, and estimate left ventricular ejection fraction.  FINDINGS: Baseline tracing shows atrial fibrillation at 68 beats per min with right bundle branch block and left anterior fascicular block. Lexiscan bolus was given in standard fashion. Heart rate increased from 67 beats per min up to 91 beats per min, and blood pressure remained stable at 117/70. No chest pain was reported. There were no diagnostic ST segment abnormalities.  Analysis of the overall perfusion data shows adequate radiotracer uptake with diaphragmatic attenuation.  Tomographic views were obtained using the short axis, vertical long axis, and horizontal long axis planes. There is a moderate size, severe intensity, inferior wall  defect extending from apex to base. This region is fixed and suggestive of scar, no large ischemic zones are noted.  Gated imaging reveals an EDV of 82, ESV of 51, LVEF of 37%, and TID ratio 1.05. There is diffuse hypokinesis  IMPRESSION: Low to intermediate risk Lexiscan Cardiolite. There were no diagnostic ST segment abnormalities, atrial fibrillation present throughout. Perfusion imaging is most consistent with scar in the inferior wall without any large ischemic territories. LVEF is calculated at 37% with fairly diffuse hypokinesis and normal volumes.   Electronically Signed   By: Rozann Lesches M.D.   On: 08/27/2013 15:11

## 2013-09-22 NOTE — Progress Notes (Signed)
HPI: Mr. Kollmann  is an 80 year old patient of Dr. Marcello Moores former phone for ongoing assessment and management of chronic diastolic and systolic heart failure, atrial fibrillation, history of DVT, PE, on Xarelto, cardiomyopathy with an EF of 40-45%, mild mitral regurg and moderate tricuspid regurg.  His last in the office on 09/04/2013 after adjustments in Lasix secondary to orthostatic hypotension. Was also evidence of dementia with rambling thought processes. He is cared for by his sister. Her last office visit the patient's blood pressure was much better controlled.Marland Kitchen  Unfortunately, the patient was seen and admitted to the hospital on 09/09/2013 in the setting of syncopal episodes. He was found to be dehydrated and orthostatic and received IV fluids. His Lasix lisinopril and beta blocker were held on admission with significant improvement in overall symptoms. Discharged on beta blocker and lisinopril were discontinued he was continued on furosemide 20 mg daily when necessary edema. He was also continued on Xarelto. He is here for followup.  The patient feels very well today. He is accompanied by his sister who states he has not had any further complaints of symptoms of dizziness or weakness. He is increasing his fluid intake. No evidence of bleeding or coughing. No evidence of fluid retention.   Allergies  Allergen Reactions  . Codeine Shortness Of Breath    Shortness of breath    Current Outpatient Prescriptions  Medication Sig Dispense Refill  . albuterol (PROVENTIL HFA) 108 (90 BASE) MCG/ACT inhaler Inhale 2 puffs into the lungs every 6 (six) hours as needed for wheezing or shortness of breath.      Marland Kitchen atorvastatin (LIPITOR) 80 MG tablet Take 80 mg by mouth daily.      . famotidine (PEPCID) 20 MG tablet Take 1 tablet (20 mg total) by mouth 2 (two) times daily.      . furosemide (LASIX) 20 MG tablet Take 1 tablet (20 mg total) by mouth daily as needed for fluid or edema.  90 tablet  3    . levothyroxine (SYNTHROID, LEVOTHROID) 50 MCG tablet Take 50 mcg by mouth daily before breakfast.      . rivaroxaban (XARELTO) 20 MG TABS tablet Take 1 tablet (20 mg total) by mouth daily with supper.  30 tablet  3   No current facility-administered medications for this visit.    Past Medical History  Diagnosis Date  . Colon cancer     Status post partial colectomy  . Hypertension   . Hypercholesterolemia   . Blood transfusion   . Coronary artery disease 2011    Non obstructive CAD  . DVT (deep venous thrombosis)   . PE (pulmonary embolism)   . Diabetes mellitus without complication   . A-fib   . Diabetes mellitus, type II   . Ventral hernia   . Right bundle branch block   . Unspecified hypothyroidism   . LVH (left ventricular hypertrophy) 10/10/2012    Ejection fraction 60-65%.  . Bilateral renal cysts   . Calculus of left kidney     non-obstructing  . COPD (chronic obstructive pulmonary disease)     Past Surgical History  Procedure Laterality Date  . Abdominal surgery      x 3  . Cardiac catheterization  2008    ROS: Review of systems complete and found to be negative unless listed above  PHYSICAL EXAM BP 118/60  Pulse 68  Ht 5\' 8"  (1.727 m)  Wt 172 lb (78.019 kg)  BMI 26.16 kg/m2  SpO2 99% General:  Well developed, well nourished, in no acute distress Head: Eyes PERRLA, No xanthomas.   Normal cephalic and atramatic  Lungs: Clear bilaterally to auscultation and percussion. Heart: HRRR S1 S2, without MRG.  Pulses are 2+ & equal.            No carotid bruit. No JVD.  No abdominal bruits. No femoral bruits. Abdomen: Bowel sounds are positive, abdomen soft and non-tender without masses or                  Hernia's noted. Msk:  Back normal, normal gait. Normal strength and tone for age. Extremities: No clubbing, cyanosis or edema.  DP +1 Neuro: Alert and oriented X 3. Psych:  Good affect, responds appropriately   ASSESSMENT AND PLAN

## 2013-10-13 ENCOUNTER — Ambulatory Visit: Payer: Non-veteran care | Admitting: Adult Health

## 2013-10-20 ENCOUNTER — Encounter: Payer: Non-veteran care | Admitting: Adult Health

## 2013-10-21 ENCOUNTER — Ambulatory Visit: Payer: Non-veteran care | Admitting: Cardiovascular Disease

## 2013-11-14 ENCOUNTER — Ambulatory Visit (INDEPENDENT_AMBULATORY_CARE_PROVIDER_SITE_OTHER): Payer: Medicare Other | Admitting: Cardiovascular Disease

## 2013-11-14 ENCOUNTER — Encounter: Payer: Self-pay | Admitting: Cardiovascular Disease

## 2013-11-14 VITALS — BP 126/72 | HR 62 | Ht 68.0 in | Wt 171.0 lb

## 2013-11-14 DIAGNOSIS — I5042 Chronic combined systolic (congestive) and diastolic (congestive) heart failure: Secondary | ICD-10-CM

## 2013-11-14 DIAGNOSIS — I2589 Other forms of chronic ischemic heart disease: Secondary | ICD-10-CM

## 2013-11-14 DIAGNOSIS — Z7901 Long term (current) use of anticoagulants: Secondary | ICD-10-CM

## 2013-11-14 DIAGNOSIS — I428 Other cardiomyopathies: Secondary | ICD-10-CM

## 2013-11-14 DIAGNOSIS — I4891 Unspecified atrial fibrillation: Secondary | ICD-10-CM

## 2013-11-14 DIAGNOSIS — I429 Cardiomyopathy, unspecified: Secondary | ICD-10-CM

## 2013-11-14 DIAGNOSIS — I10 Essential (primary) hypertension: Secondary | ICD-10-CM

## 2013-11-14 NOTE — Patient Instructions (Signed)
Your physician recommends that you schedule a follow-up appointment in:  3 months    Your physician recommends that you continue on your current medications as directed. Please refer to the Current Medication list given to you today.     Thank you for choosing [facility] !   

## 2013-11-14 NOTE — Progress Notes (Signed)
Patient ID: Bryce Mcdonald, male   DOB: [birth year], 80 y.o.   MRN: [id number]      SUBJECTIVE: The patient is an 80 year old male with a history of chronic systolic and diastolic heart failure, EF 40-50% with diffuse hypokinesis in 07/2013, dietary noncompliance, accelerated hypertension, and long-standing conduction system disease with atrial fibrillation, right bundle branch block and left anterior fascicular block. Due to problems with bradycardia and orthostatic hypotension with dizziness, he is off of beta blockers, ACE inhibitors, and only takes Lasix when needed. He is now feeling much better and is able to walk properly for the first time in 2 years. He denies chest pain, shortness of breath, leg swelling, dizziness and palpitations. His appetite is good as well.   Review of Systems: As per "subjective", otherwise negative.  Allergies  Allergen Reactions  . Codeine Shortness Of Breath    Shortness of breath    Current Outpatient Prescriptions  Medication Sig Dispense Refill  . albuterol (PROVENTIL HFA) 108 (90 BASE) MCG/ACT inhaler Inhale 2 puffs into the lungs every 6 (six) hours as needed for wheezing or shortness of breath.      Marland Kitchen atorvastatin (LIPITOR) 80 MG tablet Take 80 mg by mouth daily.      . famotidine (PEPCID) 20 MG tablet Take 1 tablet (20 mg total) by mouth 2 (two) times daily.      . furosemide (LASIX) 20 MG tablet Take 1 tablet (20 mg total) by mouth daily as needed for fluid or edema.  90 tablet  3  . levothyroxine (SYNTHROID, LEVOTHROID) 50 MCG tablet Take 50 mcg by mouth daily before breakfast.      . rivaroxaban (XARELTO) 20 MG TABS tablet Take 1 tablet (20 mg total) by mouth daily with supper.  30 tablet  3   No current facility-administered medications for this visit.    Past Medical History  Diagnosis Date  . Colon cancer     Status post partial colectomy  . Hypertension   . Hypercholesterolemia   . Blood transfusion   . Coronary artery disease 2011     Non obstructive CAD  . DVT (deep venous thrombosis)   . PE (pulmonary embolism)   . Diabetes mellitus without complication   . A-fib   . Diabetes mellitus, type II   . Ventral hernia   . Right bundle branch block   . Unspecified hypothyroidism   . LVH (left ventricular hypertrophy) 10/10/2012    Ejection fraction 60-65%.  . Bilateral renal cysts   . Calculus of left kidney     non-obstructing  . COPD (chronic obstructive pulmonary disease)     Past Surgical History  Procedure Laterality Date  . Abdominal surgery      x 3  . Cardiac catheterization  2008    History   Social History  . Marital Status: Widowed    Spouse Name: N/A    Number of Children: 8  . Years of Education: N/A   Occupational History  . Not on file.   Social History Main Topics  . Smoking status: Former Smoker -- 0.50 packs/day    Types: Cigarettes    Start date: 11/15/1943    Quit date: 02/20/1973  . Smokeless tobacco: Former Systems developer    Types: Chew     Comment: quit over 40 years ago  . Alcohol Use: No     Comment: quit over forty years ago  . Drug Use: No  . Sexual Activity: No   Other Topics Concern  .  Not on file   Social History Narrative  . No narrative on file     Filed Vitals:   11/14/13 1057  BP: 126/72  Pulse: 62  Height: 5\' 8"  (1.727 m)  Weight: 171 lb (77.565 kg)    PHYSICAL EXAM General: NAD HEENT: Normal. Neck: No JVD, no thyromegaly. Lungs: Clear to auscultation bilaterally with normal respiratory effort. CV: Nondisplaced PMI.  Irregular rhythm, normal S1/S2, no S3, no murmur. No pretibial or periankle edema.   Abdomen: Soft, no distention.  Neurologic: Alert and oriented x 3.  Psych: Normal affect. Skin: Normal. Musculoskeletal: Normal range of motion, no gross deformities. Extremities: No clubbing or cyanosis.   ECG: Most recent ECG reviewed.      ASSESSMENT AND PLAN: 1. Cardiomyopathy/chronic systolic and diastolic heart failure: Euvolemic and  stable. Lasix prn. 2. Atrial fibrillation: Rate controlled without AV nodal blocking agents, indicative of conduction system disease. Continue Xarelto. 3. Essential HTN: Controlled without therapy.  Dispo: f/u 3 months.  Kate Sable, M.D., F.A.C.C.

## 2013-11-16 ENCOUNTER — Encounter (HOSPITAL_COMMUNITY): Payer: Self-pay | Admitting: Emergency Medicine

## 2013-11-16 ENCOUNTER — Emergency Department (HOSPITAL_COMMUNITY)
Admission: EM | Admit: 2013-11-16 | Discharge: 2013-11-16 | Disposition: A | Discharge status: Home / Self Care | Payer: Non-veteran care | Attending: Emergency Medicine | Admitting: Emergency Medicine

## 2013-11-16 ENCOUNTER — Emergency Department (HOSPITAL_COMMUNITY): Payer: Non-veteran care

## 2013-11-16 DIAGNOSIS — Z86718 Personal history of other venous thrombosis and embolism: Secondary | ICD-10-CM | POA: Insufficient documentation

## 2013-11-16 DIAGNOSIS — Z87442 Personal history of urinary calculi: Secondary | ICD-10-CM | POA: Diagnosis not present

## 2013-11-16 DIAGNOSIS — R5383 Other fatigue: Secondary | ICD-10-CM

## 2013-11-16 DIAGNOSIS — R51 Headache: Secondary | ICD-10-CM | POA: Diagnosis not present

## 2013-11-16 DIAGNOSIS — I509 Heart failure, unspecified: Secondary | ICD-10-CM | POA: Insufficient documentation

## 2013-11-16 DIAGNOSIS — R5381 Other malaise: Secondary | ICD-10-CM | POA: Insufficient documentation

## 2013-11-16 DIAGNOSIS — Z86711 Personal history of pulmonary embolism: Secondary | ICD-10-CM | POA: Diagnosis not present

## 2013-11-16 DIAGNOSIS — Z85038 Personal history of other malignant neoplasm of large intestine: Secondary | ICD-10-CM | POA: Insufficient documentation

## 2013-11-16 DIAGNOSIS — R300 Dysuria: Secondary | ICD-10-CM | POA: Insufficient documentation

## 2013-11-16 DIAGNOSIS — E119 Type 2 diabetes mellitus without complications: Secondary | ICD-10-CM | POA: Insufficient documentation

## 2013-11-16 DIAGNOSIS — R197 Diarrhea, unspecified: Secondary | ICD-10-CM | POA: Insufficient documentation

## 2013-11-16 DIAGNOSIS — Z87891 Personal history of nicotine dependence: Secondary | ICD-10-CM | POA: Diagnosis not present

## 2013-11-16 DIAGNOSIS — Z9889 Other specified postprocedural states: Secondary | ICD-10-CM | POA: Insufficient documentation

## 2013-11-16 DIAGNOSIS — Z79899 Other long term (current) drug therapy: Secondary | ICD-10-CM | POA: Insufficient documentation

## 2013-11-16 DIAGNOSIS — Z7982 Long term (current) use of aspirin: Secondary | ICD-10-CM | POA: Diagnosis not present

## 2013-11-16 DIAGNOSIS — I2510 Atherosclerotic heart disease of native coronary artery without angina pectoris: Secondary | ICD-10-CM | POA: Diagnosis not present

## 2013-11-16 DIAGNOSIS — R42 Dizziness and giddiness: Secondary | ICD-10-CM | POA: Insufficient documentation

## 2013-11-16 DIAGNOSIS — J441 Chronic obstructive pulmonary disease with (acute) exacerbation: Secondary | ICD-10-CM | POA: Insufficient documentation

## 2013-11-16 DIAGNOSIS — E7800 Pure hypercholesterolemia, unspecified: Secondary | ICD-10-CM | POA: Insufficient documentation

## 2013-11-16 DIAGNOSIS — R079 Chest pain, unspecified: Secondary | ICD-10-CM | POA: Insufficient documentation

## 2013-11-16 DIAGNOSIS — I10 Essential (primary) hypertension: Secondary | ICD-10-CM | POA: Diagnosis not present

## 2013-11-16 DIAGNOSIS — E039 Hypothyroidism, unspecified: Secondary | ICD-10-CM | POA: Diagnosis not present

## 2013-11-16 DIAGNOSIS — Z8719 Personal history of other diseases of the digestive system: Secondary | ICD-10-CM | POA: Insufficient documentation

## 2013-11-16 DIAGNOSIS — Z87448 Personal history of other diseases of urinary system: Secondary | ICD-10-CM | POA: Insufficient documentation

## 2013-11-16 LAB — COMPREHENSIVE METABOLIC PANEL
ALBUMIN: 4.3 g/dL (ref 3.5–5.2)
ALT: 10 U/L (ref 0–53)
ANION GAP: 15 (ref 5–15)
AST: 20 U/L (ref 0–37)
Alkaline Phosphatase: 97 U/L (ref 39–117)
BUN: 32 mg/dL — ABNORMAL HIGH (ref 6–23)
CO2: 22 mEq/L (ref 19–32)
Calcium: 10.2 mg/dL (ref 8.4–10.5)
Chloride: 95 mEq/L — ABNORMAL LOW (ref 96–112)
Creatinine, Ser: 1.31 mg/dL (ref 0.50–1.35)
GFR calc Af Amer: 54 mL/min — ABNORMAL LOW (ref >90)
GFR calc non Af Amer: 47 mL/min — ABNORMAL LOW (ref >90)
Glucose, Bld: 108 mg/dL — ABNORMAL HIGH (ref 70–99)
POTASSIUM: 5.5 meq/L — AB (ref 3.7–5.3)
SODIUM: 132 meq/L — AB (ref 137–147)
TOTAL PROTEIN: 7.7 g/dL (ref 6.0–8.3)
Total Bilirubin: 0.8 mg/dL (ref 0.3–1.2)

## 2013-11-16 LAB — CBC WITH DIFFERENTIAL/PLATELET
BASOS ABS: 0 10*3/uL (ref 0.0–0.1)
Basophils Relative: 1 % (ref 0–1)
EOS ABS: 0 10*3/uL (ref 0.0–0.7)
Eosinophils Relative: 0 % (ref 0–5)
HCT: 45.2 % (ref 39.0–52.0)
Hemoglobin: 15.4 g/dL (ref 13.0–17.0)
Lymphocytes Relative: 61 % — ABNORMAL HIGH (ref 12–46)
Lymphs Abs: 2.2 10*3/uL (ref 0.7–4.0)
MCH: 32.7 pg (ref 26.0–34.0)
MCHC: 34.1 g/dL (ref 30.0–36.0)
MCV: 96 fL (ref 78.0–100.0)
Monocytes Absolute: 0.4 10*3/uL (ref 0.1–1.0)
Monocytes Relative: 11 % (ref 3–12)
NEUTROS PCT: 27 % — AB (ref 43–77)
Neutro Abs: 1 10*3/uL — ABNORMAL LOW (ref 1.7–7.7)
Platelets: 161 10*3/uL (ref 150–400)
RBC: 4.71 MIL/uL (ref 4.22–5.81)
RDW: 14.6 % (ref 11.5–15.5)
WBC: 3.6 10*3/uL — ABNORMAL LOW (ref 4.0–10.5)

## 2013-11-16 LAB — URINALYSIS, ROUTINE W REFLEX MICROSCOPIC
Bilirubin Urine: NEGATIVE
GLUCOSE, UA: NEGATIVE mg/dL
Hgb urine dipstick: NEGATIVE
Ketones, ur: NEGATIVE mg/dL
LEUKOCYTES UA: NEGATIVE
NITRITE: NEGATIVE
PH: 6.5 (ref 5.0–8.0)
PROTEIN: NEGATIVE mg/dL
Specific Gravity, Urine: 1.01 (ref 1.005–1.030)
Urobilinogen, UA: 0.2 mg/dL (ref 0.0–1.0)

## 2013-11-16 LAB — APTT: APTT: 33 s (ref 24–37)

## 2013-11-16 LAB — TROPONIN I: Troponin I: <0.3 ng/mL (ref <0.30)

## 2013-11-16 LAB — PROTIME-INR
INR: 1.05 (ref 0.00–1.49)
Prothrombin Time: 13.7 seconds (ref 11.6–15.2)

## 2013-11-16 LAB — PRO B NATRIURETIC PEPTIDE: PRO B NATRI PEPTIDE: 770.7 pg/mL — AB (ref 0–450)

## 2013-11-16 MED ORDER — MECLIZINE HCL 25 MG PO TABS: 25.0000 mg | ORAL_TABLET | Freq: Three times a day (TID) | ORAL | Status: DC | PRN

## 2013-11-16 MED ORDER — MECLIZINE HCL 12.5 MG PO TABS
25.0000 mg | ORAL_TABLET | Freq: Once | ORAL | Status: AC
  Administered 2013-11-16: 25 mg via ORAL
  Filled 2013-11-16: qty 2

## 2013-11-16 NOTE — ED Notes (Signed)
Pt back from CT/XRAY

## 2013-11-16 NOTE — Discharge Instructions (Signed)
Take the meclizine for your dizziness. Continue to monitor your blood pressure and let Dr Kristian Covey office know those results.  Recheck if you get a headache, get vomiting or your dizziness seems worse.

## 2013-11-16 NOTE — ED Provider Notes (Signed)
CSN: [id number]     Arrival date & time 11/16/13  4431 History  This chart was scribed for No att. providers found by Molli Posey, ED Scribe. This patient was seen in room APA14/APA14 and the patient's care was started at 7:53 AM.     Chief Complaint  Patient presents with  . Dizziness     The history is provided by the patient. No language interpreter was used.   HPI Comments: Bryce Mcdonald is a 80 y.o. male with a history of CHF, colon and prostate CA and MI who presents to the Emergency Department complaining of intermittent, worsening dizziness that started  A month ago ago with his current episode lasting 3 days. The patient's daughter states that he was confused 2 days ago. He was seen by his cardiologist 2 days ago and they were told to continue checking his BP's to make sure his BP didn't get below 100.  Marland Kitchen He states that this morning he had an unsteady gait and felt like the room was spinning. Patient's daughter reports he had a BP of 87/63 with pulse 79 yesterday. Patient reports associated chronic frontal  headaches for 12 years and blurred vision with the dizziness, dysuria described as stinging, urgency, chronic diarrhea, intermittent cough productive of white sputum, and SOB currently. He is unsure of fever.Patient reports he has an inhaler and used it this morning with relief.The patient describes the headaches as sometimes dull and other times sharp. Patient denies nausea or vomiting, chills, abdominal pain. PT has started seeing things and states sometimes things are moving, he threatened to jump out of the car 2 days ago because he thought it was moving. He states the chairs were moving today. Pt is also having a lot of religious overtones, talking about it couldn't sleep last night because "the devil is running the world". Daughter states he seems confused the past couple of days.     The patient lives at [facility].   PCP Dr Berline Lopes at  [facility] Dr Jacinta Shoe  Past Medical History  Diagnosis Date  . Colon cancer     Status post partial colectomy  . Hypertension   . Hypercholesterolemia   . Blood transfusion   . Coronary artery disease 2011    Non obstructive CAD  . DVT (deep venous thrombosis)   . PE (pulmonary embolism)   . Diabetes mellitus without complication   . A-fib   . Diabetes mellitus, type II   . Ventral hernia   . Right bundle branch block   . Unspecified hypothyroidism   . LVH (left ventricular hypertrophy) 10/10/2012    Ejection fraction 60-65%.  . Bilateral renal cysts   . Calculus of left kidney     non-obstructing  . COPD (chronic obstructive pulmonary disease)   . CHF (congestive heart failure)    Past Surgical History  Procedure Laterality Date  . Abdominal surgery      x 3  . Cardiac catheterization  2008   History reviewed. No pertinent family history. History  Substance Use Topics  . Smoking status: Former Smoker -- 0.50 packs/day    Types: Cigarettes    Start date: 11/15/1943    Quit date: 02/20/1973  . Smokeless tobacco: Former Systems developer    Types: Chew     Comment: quit over 40 years ago  . Alcohol Use: No     Comment: quit over forty years ago  lives in ALF in an apartment by himself  Review of Systems  Constitutional: Negative for fever and chills.  Respiratory: Positive for cough and shortness of breath.   Cardiovascular: Positive for chest pain.  Gastrointestinal: Positive for diarrhea. Negative for nausea, vomiting and abdominal pain.  Genitourinary: Positive for dysuria and urgency.  Neurological: Positive for dizziness, weakness and headaches.  All other systems reviewed and are negative.     Allergies  Codeine  Home Medications   Prior to Admission medications   Medication Sig Start Date End Date Taking? Authorizing Provider  aspirin 81 MG chewable tablet Chew 81 mg by mouth daily.   Yes Historical Provider, MD  atorvastatin (LIPITOR) 80 MG  tablet Take 80 mg by mouth daily.   Yes Historical Provider, MD  famotidine (PEPCID) 20 MG tablet Take 1 tablet (20 mg total) by mouth 2 (two) times daily. 12/06/12  Yes Lezlie Octave Black, NP  furosemide (LASIX) 20 MG tablet Take 1 tablet (20 mg total) by mouth daily as needed for fluid or edema. 09/09/13  Yes Costin Karlyne Greenspan, MD  levothyroxine (SYNTHROID, LEVOTHROID) 50 MCG tablet Take 50 mcg by mouth daily before breakfast.   Yes Historical Provider, MD  lisinopril (PRINIVIL,ZESTRIL) 10 MG tablet Take 5 mg by mouth daily.   Yes Historical Provider, MD  metoprolol tartrate (LOPRESSOR) 25 MG tablet Take 12.5 mg by mouth 2 (two) times daily.   Yes Historical Provider, MD  albuterol (PROVENTIL HFA) 108 (90 BASE) MCG/ACT inhaler Inhale 2 puffs into the lungs every 6 (six) hours as needed for wheezing or shortness of breath.    Historical Provider, MD   BP 115/83  Pulse 70  Temp(Src) 97.9 F (36.6 C)  Resp 16  Ht 6' (1.829 m)  Wt 171 lb (77.565 kg)  BMI 23.19 kg/m2  SpO2 99%  Vital signs normal     Orthostatic Vital Signs Orthostatic Lying - BP- Lying: 120/66 mmHg ; Pulse- Lying: 72  Orthostatic Sitting - BP- Sitting: 100/76 mmHg ; Pulse- Sitting: 85  Orthostatic Standing at 0 minutes - BP- Standing at 0 minutes: 118/69 mmHg ; Pulse- Standing at 0 minutes: 80    Physical Exam  Nursing note and vitals reviewed. Constitutional: He is oriented to person, place, and time. He appears well-developed and well-nourished.  Non-toxic appearance. He does not appear ill. No distress.  HENT:  Head: Normocephalic and atraumatic.  Right Ear: External ear normal.  Left Ear: External ear normal.  Nose: Nose normal. No mucosal edema or rhinorrhea.  Mouth/Throat: Oropharynx is clear and moist and mucous membranes are normal. No dental abscesses or uvula swelling.  Tongue is dry, missing a lot of teeth   Eyes: Conjunctivae and EOM are normal. Pupils are equal, round, and reactive to light.  Neck: Normal  range of motion and full passive range of motion without pain. Neck supple.  Cardiovascular: Normal rate, regular rhythm and normal heart sounds.  Exam reveals no gallop and no friction rub.   No murmur heard. Pulmonary/Chest: Effort normal and breath sounds normal. No respiratory distress. He has no wheezes. He has no rhonchi. He has no rales. He exhibits no tenderness and no crepitus.  Abdominal: Soft. Normal appearance and bowel sounds are normal. He exhibits no distension. There is no tenderness. There is no rebound and no guarding.  Musculoskeletal: Normal range of motion. He exhibits no edema and no tenderness.  Moves all extremities well.   Neurological: He is alert and oriented to person, place, and time. He has normal strength. No cranial nerve  deficit.  Skin: Skin is warm, dry and intact. No rash noted. No erythema. No pallor.  Psychiatric: He has a normal mood and affect. His speech is normal and behavior is normal. His mood appears not anxious.    ED Course  Procedures (including critical care time)  Medications  meclizine (ANTIVERT) tablet 25 mg (25 mg Oral Given 11/16/13 0855)     DIAGNOSTIC STUDIES:   COORDINATION OF CARE:  8:15 AM Discuss treatment plan with patient's daughter and she agreed to the treatment.   11:38 AM Patient states he feels better and dizziness has improved. Ambulated by nursing staff without difficulty.    Labs Review Results for orders placed during the hospital encounter of 11/16/13  CBC WITH DIFFERENTIAL      Result Value Ref Range   WBC 3.6 (*) 4.0 - 10.5 K/uL   RBC 4.71  4.22 - 5.81 MIL/uL   Hemoglobin 15.4  13.0 - 17.0 g/dL   HCT 45.2  39.0 - 52.0 %   MCV 96.0  78.0 - 100.0 fL   MCH 32.7  26.0 - 34.0 pg   MCHC 34.1  30.0 - 36.0 g/dL   RDW 14.6  11.5 - 15.5 %   Platelets 161  150 - 400 K/uL   Neutrophils Relative % 27 (*) 43 - 77 %   Lymphocytes Relative 61 (*) 12 - 46 %   Monocytes Relative 11  3 - 12 %   Eosinophils Relative 0   0 - 5 %   Basophils Relative 1  0 - 1 %   Neutro Abs 1.0 (*) 1.7 - 7.7 K/uL   Lymphs Abs 2.2  0.7 - 4.0 K/uL   Monocytes Absolute 0.4  0.1 - 1.0 K/uL   Eosinophils Absolute 0.0  0.0 - 0.7 K/uL   Basophils Absolute 0.0  0.0 - 0.1 K/uL   WBC Morphology ATYPICAL LYMPHOCYTES    COMPREHENSIVE METABOLIC PANEL      Result Value Ref Range   Sodium 132 (*) 137 - 147 mEq/L   Potassium 5.5 (*) 3.7 - 5.3 mEq/L   Chloride 95 (*) 96 - 112 mEq/L   CO2 22  19 - 32 mEq/L   Glucose, Bld 108 (*) 70 - 99 mg/dL   BUN 32 (*) 6 - 23 mg/dL   Creatinine, Ser 1.31  0.50 - 1.35 mg/dL   Calcium 10.2  8.4 - 10.5 mg/dL   Total Protein 7.7  6.0 - 8.3 g/dL   Albumin 4.3  3.5 - 5.2 g/dL   AST 20  0 - 37 U/L   ALT 10  0 - 53 U/L   Alkaline Phosphatase 97  39 - 117 U/L   Total Bilirubin 0.8  0.3 - 1.2 mg/dL   GFR calc non Af Amer 47 (*) >90 mL/min   GFR calc Af Amer 54 (*) >90 mL/min   Anion gap 15  5 - 15  TROPONIN I      Result Value Ref Range   Troponin I <0.30  <0.30 ng/mL  PRO B NATRIURETIC PEPTIDE      Result Value Ref Range   Pro B Natriuretic peptide (BNP) 770.7 (*) 0 - 450 pg/mL  URINALYSIS, ROUTINE W REFLEX MICROSCOPIC      Result Value Ref Range   Color, Urine ORANGE (*) YELLOW   APPearance CLEAR  CLEAR   Specific Gravity, Urine 1.010  1.005 - 1.030   pH 6.5  5.0 - 8.0   Glucose, UA NEGATIVE  NEGATIVE mg/dL   Hgb urine dipstick NEGATIVE  NEGATIVE   Bilirubin Urine NEGATIVE  NEGATIVE   Ketones, ur NEGATIVE  NEGATIVE mg/dL   Protein, ur NEGATIVE  NEGATIVE mg/dL   Urobilinogen, UA 0.2  0.0 - 1.0 mg/dL   Nitrite NEGATIVE  NEGATIVE   Leukocytes, UA NEGATIVE  NEGATIVE  APTT      Result Value Ref Range   aPTT 33  24 - 37 seconds  PROTIME-INR      Result Value Ref Range   Prothrombin Time 13.7  11.6 - 15.2 seconds   INR 1.05  0.00 - 1.49   Laboratory interpretation all normal except mild hyponatremia, low chloride, mildly elevated potassium persistently elevated BUN, elevated BNP but half of  his prior values     Imaging Review Dg Chest 2 View  11/16/2013   CLINICAL DATA:  Shortness of breath and dizziness  EXAM: CHEST - 2 VIEW  COMPARISON:  08/10/2013  FINDINGS: Stable hyperinflation with bibasilar scarring, compatible COPD/emphysema. Normal heart size and vascularity. No new airspace process, collapse or consolidation. Negative for edema, large effusion or pneumothorax. Trachea midline. Atherosclerosis of the aorta.  IMPRESSION: Stable COPD/emphysema pattern with basilar scarring.   Electronically Signed   By: Daryll Brod M.D.   On: 11/16/2013 10:07   Ct Head Wo Contrast  11/16/2013   CLINICAL DATA:  Dizziness  EXAM: CT HEAD WITHOUT CONTRAST  TECHNIQUE: Contiguous axial images were obtained from the base of the skull through the vertex without contrast.  COMPARISON:  10/16/2010  FINDINGS: Stable diffuse brain atrophy and chronic white matter microvascular ischemic changes throughout the cerebrum. No acute intracranial hemorrhage, mass lesion, definite infarction, midline shift, herniation, hydrocephalus, or extra-axial fluid collection. Cisterns patent. Cerebellar atrophy as well. Mastoids and sinuses clear.  IMPRESSION: Stable brain atrophy and chronic white matter microvascular changes. No interval change or acute process.   Electronically Signed   By: Daryll Brod M.D.   On: 11/16/2013 09:55     EKG Interpretation   Date/Time:  Sunday November 16 2013 08:10:44 EDT Ventricular Rate:  68 PR Interval:    QRS Duration: 128 QT Interval:  409 QTC Calculation: 435 R Axis:   -110 Text Interpretation:  Atrial fibrillation RBBB and LAFB Since last tracing  rate faster (08 Sep 2013) Confirmed by Shakela Donati  MD-I, Gean Larose ([id number]) on  11/16/2013 9:14:37 AM      MDM   Final diagnoses:  Dizziness    Discharge Medication List as of 11/16/2013 11:58 AM    START taking these medications   Details  meclizine (ANTIVERT) 25 MG tablet Take 1 tablet (25 mg total) by mouth 3 (three) times  daily as needed for dizziness., Starting 11/16/2013, Until Discontinued, Print        Plan discharge  Rolland Porter, MD, FACEP   I personally performed the services described in this documentation, which was scribed in my presence. The recorded information has been reviewed and considered.  Rolland Porter, MD, Abram Sander   Janice Norrie, MD 11/16/13 [phone]

## 2013-11-16 NOTE — ED Notes (Signed)
Pt c/o intermittent ha/dizziness x 2 weeks. Daughter reports low bp today.

## 2013-11-16 NOTE — ED Notes (Signed)
MD at bedside. 

## 2013-11-20 ENCOUNTER — Observation Stay (HOSPITAL_COMMUNITY)
Admission: EM | Admit: 2013-11-20 | Discharge: 2013-11-21 | Disposition: A | Discharge status: Home / Self Care | Payer: Non-veteran care | Attending: Internal Medicine | Admitting: Internal Medicine

## 2013-11-20 ENCOUNTER — Encounter (HOSPITAL_COMMUNITY): Payer: Self-pay | Admitting: Emergency Medicine

## 2013-11-20 ENCOUNTER — Emergency Department (HOSPITAL_COMMUNITY): Payer: Non-veteran care

## 2013-11-20 DIAGNOSIS — Z7982 Long term (current) use of aspirin: Secondary | ICD-10-CM | POA: Diagnosis not present

## 2013-11-20 DIAGNOSIS — Z86711 Personal history of pulmonary embolism: Secondary | ICD-10-CM | POA: Insufficient documentation

## 2013-11-20 DIAGNOSIS — I10 Essential (primary) hypertension: Secondary | ICD-10-CM | POA: Diagnosis not present

## 2013-11-20 DIAGNOSIS — Z9889 Other specified postprocedural states: Secondary | ICD-10-CM | POA: Diagnosis not present

## 2013-11-20 DIAGNOSIS — Z86718 Personal history of other venous thrombosis and embolism: Secondary | ICD-10-CM | POA: Insufficient documentation

## 2013-11-20 DIAGNOSIS — I4510 Unspecified right bundle-branch block: Secondary | ICD-10-CM | POA: Diagnosis not present

## 2013-11-20 DIAGNOSIS — Z87891 Personal history of nicotine dependence: Secondary | ICD-10-CM | POA: Insufficient documentation

## 2013-11-20 DIAGNOSIS — E119 Type 2 diabetes mellitus without complications: Secondary | ICD-10-CM | POA: Diagnosis not present

## 2013-11-20 DIAGNOSIS — D649 Anemia, unspecified: Secondary | ICD-10-CM | POA: Diagnosis present

## 2013-11-20 DIAGNOSIS — I509 Heart failure, unspecified: Secondary | ICD-10-CM | POA: Diagnosis not present

## 2013-11-20 DIAGNOSIS — K439 Ventral hernia without obstruction or gangrene: Secondary | ICD-10-CM | POA: Insufficient documentation

## 2013-11-20 DIAGNOSIS — Z85038 Personal history of other malignant neoplasm of large intestine: Secondary | ICD-10-CM | POA: Diagnosis not present

## 2013-11-20 DIAGNOSIS — F0390 Unspecified dementia without behavioral disturbance: Secondary | ICD-10-CM | POA: Diagnosis present

## 2013-11-20 DIAGNOSIS — I5032 Chronic diastolic (congestive) heart failure: Secondary | ICD-10-CM | POA: Diagnosis present

## 2013-11-20 DIAGNOSIS — R109 Unspecified abdominal pain: Secondary | ICD-10-CM | POA: Diagnosis present

## 2013-11-20 DIAGNOSIS — E780 Pure hypercholesterolemia: Secondary | ICD-10-CM | POA: Diagnosis not present

## 2013-11-20 DIAGNOSIS — E875 Hyperkalemia: Secondary | ICD-10-CM | POA: Diagnosis not present

## 2013-11-20 DIAGNOSIS — J449 Chronic obstructive pulmonary disease, unspecified: Secondary | ICD-10-CM | POA: Insufficient documentation

## 2013-11-20 DIAGNOSIS — I4891 Unspecified atrial fibrillation: Secondary | ICD-10-CM | POA: Insufficient documentation

## 2013-11-20 DIAGNOSIS — Z79899 Other long term (current) drug therapy: Secondary | ICD-10-CM | POA: Diagnosis not present

## 2013-11-20 DIAGNOSIS — E039 Hypothyroidism, unspecified: Secondary | ICD-10-CM | POA: Diagnosis not present

## 2013-11-20 DIAGNOSIS — I517 Cardiomegaly: Secondary | ICD-10-CM | POA: Insufficient documentation

## 2013-11-20 DIAGNOSIS — R05 Cough: Secondary | ICD-10-CM | POA: Insufficient documentation

## 2013-11-20 DIAGNOSIS — R42 Dizziness and giddiness: Secondary | ICD-10-CM | POA: Diagnosis not present

## 2013-11-20 DIAGNOSIS — R1032 Left lower quadrant pain: Secondary | ICD-10-CM

## 2013-11-20 DIAGNOSIS — Q6102 Congenital multiple renal cysts: Secondary | ICD-10-CM | POA: Insufficient documentation

## 2013-11-20 DIAGNOSIS — I2510 Atherosclerotic heart disease of native coronary artery without angina pectoris: Secondary | ICD-10-CM | POA: Insufficient documentation

## 2013-11-20 DIAGNOSIS — N200 Calculus of kidney: Secondary | ICD-10-CM | POA: Diagnosis not present

## 2013-11-20 DIAGNOSIS — R197 Diarrhea, unspecified: Secondary | ICD-10-CM | POA: Diagnosis not present

## 2013-11-20 DIAGNOSIS — C61 Malignant neoplasm of prostate: Secondary | ICD-10-CM | POA: Diagnosis present

## 2013-11-20 LAB — BASIC METABOLIC PANEL
ANION GAP: 14 (ref 5–15)
BUN: 27 mg/dL — AB (ref 6–23)
CHLORIDE: 102 meq/L (ref 96–112)
CO2: 19 mEq/L (ref 19–32)
Calcium: 9.9 mg/dL (ref 8.4–10.5)
Creatinine, Ser: 1.21 mg/dL (ref 0.50–1.35)
GFR, EST AFRICAN AMERICAN: 59 mL/min — AB (ref >90)
GFR, EST NON AFRICAN AMERICAN: 51 mL/min — AB (ref >90)
Glucose, Bld: 71 mg/dL (ref 70–99)
POTASSIUM: 5.3 meq/L (ref 3.7–5.3)
SODIUM: 135 meq/L — AB (ref 137–147)

## 2013-11-20 LAB — CBC WITH DIFFERENTIAL/PLATELET
BASOS PCT: 0 % (ref 0–1)
Basophils Absolute: 0 10*3/uL (ref 0.0–0.1)
EOS PCT: 0 % (ref 0–5)
Eosinophils Absolute: 0 10*3/uL (ref 0.0–0.7)
HCT: 42.3 % (ref 39.0–52.0)
Hemoglobin: 14.4 g/dL (ref 13.0–17.0)
Lymphocytes Relative: 60 % — ABNORMAL HIGH (ref 12–46)
Lymphs Abs: 2.4 10*3/uL (ref 0.7–4.0)
MCH: 32.7 pg (ref 26.0–34.0)
MCHC: 34 g/dL (ref 30.0–36.0)
MCV: 96.1 fL (ref 78.0–100.0)
MONO ABS: 0.5 10*3/uL (ref 0.1–1.0)
Monocytes Relative: 12 % (ref 3–12)
Neutro Abs: 1.1 10*3/uL — ABNORMAL LOW (ref 1.7–7.7)
Neutrophils Relative %: 28 % — ABNORMAL LOW (ref 43–77)
Platelets: 172 10*3/uL (ref 150–400)
RBC: 4.4 MIL/uL (ref 4.22–5.81)
RDW: 14.5 % (ref 11.5–15.5)
WBC: 4.1 10*3/uL (ref 4.0–10.5)

## 2013-11-20 LAB — COMPREHENSIVE METABOLIC PANEL
ALBUMIN: 4.5 g/dL (ref 3.5–5.2)
ALT: 11 U/L (ref 0–53)
AST: 20 U/L (ref 0–37)
Alkaline Phosphatase: 93 U/L (ref 39–117)
Anion gap: 13 (ref 5–15)
BUN: 30 mg/dL — AB (ref 6–23)
CO2: 22 mEq/L (ref 19–32)
CREATININE: 1.35 mg/dL (ref 0.50–1.35)
Calcium: 10.3 mg/dL (ref 8.4–10.5)
Chloride: 99 mEq/L (ref 96–112)
GFR calc Af Amer: 52 mL/min — ABNORMAL LOW (ref >90)
GFR calc non Af Amer: 45 mL/min — ABNORMAL LOW (ref >90)
Glucose, Bld: 101 mg/dL — ABNORMAL HIGH (ref 70–99)
Potassium: 6.7 mEq/L (ref 3.7–5.3)
Sodium: 134 mEq/L — ABNORMAL LOW (ref 137–147)
Total Bilirubin: 0.4 mg/dL (ref 0.3–1.2)
Total Protein: 8.2 g/dL (ref 6.0–8.3)

## 2013-11-20 LAB — CLOSTRIDIUM DIFFICILE BY PCR: Toxigenic C. Difficile by PCR: NEGATIVE

## 2013-11-20 LAB — GLUCOSE, CAPILLARY
GLUCOSE-CAPILLARY: 74 mg/dL (ref 70–99)
GLUCOSE-CAPILLARY: 109 mg/dL — AB (ref 70–99)

## 2013-11-20 LAB — URINALYSIS, ROUTINE W REFLEX MICROSCOPIC
Bilirubin Urine: NEGATIVE
Glucose, UA: NEGATIVE mg/dL
Hgb urine dipstick: NEGATIVE
Ketones, ur: NEGATIVE mg/dL
LEUKOCYTES UA: NEGATIVE
Nitrite: NEGATIVE
PROTEIN: NEGATIVE mg/dL
Specific Gravity, Urine: <1.005 — ABNORMAL LOW (ref 1.005–1.030)
Urobilinogen, UA: 0.2 mg/dL (ref 0.0–1.0)
pH: 5.5 (ref 5.0–8.0)

## 2013-11-20 LAB — LIPASE, BLOOD: LIPASE: 94 U/L — AB (ref 11–59)

## 2013-11-20 LAB — TROPONIN I: Troponin I: <0.3 ng/mL (ref <0.30)

## 2013-11-20 LAB — CBG MONITORING, ED: Glucose-Capillary: 113 mg/dL — ABNORMAL HIGH (ref 70–99)

## 2013-11-20 LAB — POC OCCULT BLOOD, ED: FECAL OCCULT BLD: NEGATIVE

## 2013-11-20 LAB — POTASSIUM: Potassium: 6.6 mEq/L (ref 3.7–5.3)

## 2013-11-20 LAB — LACTIC ACID, PLASMA: Lactic Acid, Venous: 1.8 mmol/L (ref 0.5–2.2)

## 2013-11-20 MED ORDER — ONDANSETRON HCL 4 MG PO TABS: 4.0000 mg | ORAL_TABLET | Freq: Four times a day (QID) | ORAL | Status: DC | PRN

## 2013-11-20 MED ORDER — HYDROCODONE-ACETAMINOPHEN 5-325 MG PO TABS
1.0000 | ORAL_TABLET | ORAL | Status: DC | PRN
  Administered 2013-11-20: 1 via ORAL
  Filled 2013-11-20: qty 1

## 2013-11-20 MED ORDER — FAMOTIDINE 20 MG PO TABS
20.0000 mg | ORAL_TABLET | Freq: Two times a day (BID) | ORAL | Status: DC
  Administered 2013-11-20 – 2013-11-21 (×2): 20 mg via ORAL
  Filled 2013-11-20 (×2): qty 1

## 2013-11-20 MED ORDER — SODIUM CHLORIDE 0.9 % IV SOLN
INTRAVENOUS | Status: DC
  Administered 2013-11-21: 12:00:00 via INTRAVENOUS

## 2013-11-20 MED ORDER — DEXTROSE 50 % IV SOLN
INTRAVENOUS | Status: AC
  Administered 2013-11-20: 50 mL via INTRAVENOUS
  Filled 2013-11-20: qty 50

## 2013-11-20 MED ORDER — SODIUM CHLORIDE 0.9 % IV SOLN
1.0000 g | Freq: Once | INTRAVENOUS | Status: AC
  Administered 2013-11-20: 1 g via INTRAVENOUS
  Filled 2013-11-20: qty 10

## 2013-11-20 MED ORDER — ONDANSETRON HCL 4 MG/2ML IJ SOLN: 4.0000 mg | Freq: Four times a day (QID) | INTRAMUSCULAR | Status: DC | PRN

## 2013-11-20 MED ORDER — INSULIN ASPART 100 UNIT/ML ~~LOC~~ SOLN: 0.0000 [IU] | Freq: Three times a day (TID) | SUBCUTANEOUS | Status: DC

## 2013-11-20 MED ORDER — IOHEXOL 300 MG/ML  SOLN
50.0000 mL | Freq: Once | INTRAMUSCULAR | Status: AC | PRN
  Administered 2013-11-20: 50 mL via ORAL

## 2013-11-20 MED ORDER — ENOXAPARIN SODIUM 40 MG/0.4ML ~~LOC~~ SOLN
40.0000 mg | SUBCUTANEOUS | Status: DC
  Administered 2013-11-20: 40 mg via SUBCUTANEOUS
  Filled 2013-11-20: qty 0.4

## 2013-11-20 MED ORDER — ASPIRIN 81 MG PO CHEW
81.0000 mg | CHEWABLE_TABLET | Freq: Every day | ORAL | Status: DC
  Administered 2013-11-21: 81 mg via ORAL
  Filled 2013-11-20: qty 1

## 2013-11-20 MED ORDER — ALBUTEROL SULFATE (2.5 MG/3ML) 0.083% IN NEBU
3.0000 mL | INHALATION_SOLUTION | Freq: Four times a day (QID) | RESPIRATORY_TRACT | Status: DC | PRN
Start: 2013-11-20 — End: 2013-11-21

## 2013-11-20 MED ORDER — ACETAMINOPHEN 325 MG PO TABS: 650.0000 mg | ORAL_TABLET | Freq: Four times a day (QID) | ORAL | Status: DC | PRN

## 2013-11-20 MED ORDER — SODIUM BICARBONATE 8.4 % IV SOLN
50.0000 meq | Freq: Once | INTRAVENOUS | Status: AC
  Administered 2013-11-20: 50 meq via INTRAVENOUS
  Filled 2013-11-20: qty 50

## 2013-11-20 MED ORDER — SODIUM POLYSTYRENE SULFONATE 15 GM/60ML PO SUSP
30.0000 g | Freq: Once | ORAL | Status: AC
  Administered 2013-11-20: 30 g via ORAL
  Filled 2013-11-20: qty 120

## 2013-11-20 MED ORDER — DEXTROSE 50 % IV SOLN
1.0000 | Freq: Once | INTRAVENOUS | Status: AC
  Administered 2013-11-20: 50 mL via INTRAVENOUS

## 2013-11-20 MED ORDER — INFLUENZA VAC SPLIT QUAD 0.5 ML IM SUSY
0.5000 mL | PREFILLED_SYRINGE | INTRAMUSCULAR | Status: DC
  Filled 2013-11-20: qty 0.5

## 2013-11-20 MED ORDER — SODIUM CHLORIDE 0.9 % IV BOLUS (SEPSIS)
1000.0000 mL | Freq: Once | INTRAVENOUS | Status: AC
  Administered 2013-11-20: 1000 mL via INTRAVENOUS

## 2013-11-20 MED ORDER — ATORVASTATIN CALCIUM 40 MG PO TABS
80.0000 mg | ORAL_TABLET | Freq: Every day | ORAL | Status: DC
  Administered 2013-11-20: 80 mg via ORAL
  Filled 2013-11-20 (×2): qty 2

## 2013-11-20 MED ORDER — LEVOTHYROXINE SODIUM 50 MCG PO TABS
50.0000 ug | ORAL_TABLET | Freq: Every day | ORAL | Status: DC
  Administered 2013-11-21: 50 ug via ORAL
  Filled 2013-11-20: qty 1

## 2013-11-20 MED ORDER — INSULIN ASPART 100 UNIT/ML ~~LOC~~ SOLN
5.0000 [IU] | Freq: Once | SUBCUTANEOUS | Status: AC
  Administered 2013-11-20: 5 [IU] via SUBCUTANEOUS
  Filled 2013-11-20: qty 1

## 2013-11-20 MED ORDER — ALUM & MAG HYDROXIDE-SIMETH 200-200-20 MG/5ML PO SUSP: 30.0000 mL | Freq: Four times a day (QID) | ORAL | Status: DC | PRN

## 2013-11-20 MED ORDER — MECLIZINE HCL 12.5 MG PO TABS: 25.0000 mg | ORAL_TABLET | Freq: Three times a day (TID) | ORAL | Status: DC | PRN

## 2013-11-20 MED ORDER — SODIUM CHLORIDE 0.9 % IJ SOLN
3.0000 mL | Freq: Two times a day (BID) | INTRAMUSCULAR | Status: DC
  Administered 2013-11-20: 3 mL via INTRAVENOUS

## 2013-11-20 MED ORDER — ACETAMINOPHEN 650 MG RE SUPP: 650.0000 mg | Freq: Four times a day (QID) | RECTAL | Status: DC | PRN

## 2013-11-20 MED ORDER — TRAZODONE HCL 50 MG PO TABS: 25.0000 mg | ORAL_TABLET | Freq: Every evening | ORAL | Status: DC | PRN

## 2013-11-20 MED ORDER — IOHEXOL 300 MG/ML  SOLN: 100.0000 mL | Freq: Once | INTRAMUSCULAR | Status: DC | PRN

## 2013-11-20 NOTE — H&P (Signed)
Pt seen and examined. Agree with above h and p. Briefly, pt initially presents with flank pain and dizziness. Pt was recently seen 5 days prior to admit for dizziness and subsequently discharged from the ED. The patient's symptoms had since resolved in the ED, however pt was noted to have an elevated K of just under 7. The patient was given insulin, kayexalate, and bicarb. The hospitalist service has since been consulted for admission. For now, pt will cont with close observation overnight. Will follow lytes closely.

## 2013-11-20 NOTE — ED Notes (Signed)
CRITICAL VALUE ALERT  Critical value received:  K+ 6.6  Date of notification:  11/20/2013  Time of notification:  0981  Critical value read back:Yes.    Nurse who received alert:  Fredna Dow RN  MD notified (1st page):  Rancour  Time of first page:  1349  MD notified (2nd page):  Time of second page:  Responding MD:  Rancour  Time MD responded:  1349

## 2013-11-20 NOTE — ED Notes (Signed)
CRITICAL VALUE ALERT  Critical value received:  K+ 6.7  Date of notification:  11/20/2013  Time of notification:  2257  Critical value read back:Yes.    Nurse who received alert:  Fredna Dow RN  MD notified (1st page):  Rancour  Time of first page:  1210  MD notified (2nd page):  Time of second page:  Responding MD:  Rancour  Time MD responded: 5051

## 2013-11-20 NOTE — ED Notes (Signed)
Assisted pt to bsc. 1 episode of diarrhea.

## 2013-11-20 NOTE — ED Notes (Signed)
Handoff given to [employer]. Floor will call when nurse available per [facility]

## 2013-11-20 NOTE — ED Notes (Signed)
Nurse unable to receive report, will call back

## 2013-11-20 NOTE — ED Notes (Signed)
Lab called with k+ results and states, " short sample" spoke with Lab and said possibly hemolyzed and unable to see since sample so little. New order received by Dr. Wyvonnia Dusky.

## 2013-11-20 NOTE — ED Notes (Signed)
Pt c/o left flank pain radiating to abd and diarrhea x 1 week.

## 2013-11-20 NOTE — H&P (Signed)
[facility] History and Physical  Bryce Mcdonald NOM:[id number] DOB: Jun 19, 1921 DOA: 11/20/2013  Referring physician:  PCP: PROVIDER NOT IN SYSTEM   Chief Complaint: flank pain  HPI: Bryce Mcdonald is a 80 y.o. male with past medical history that includes atrial fibrillation, hypertension, history of DVT and PE, diabetes, coronary artery disease presented to the emergency department with the chief complaint of flank pain and dizziness. Initial evaluation in the emergency department reveals a hyperkalemia with a potassium of 6.7.  Information is obtained from the patient. Information from the patient is somewhat unreliable due to dementia. He reports that over the last several weeks he has experienced mild dizziness in the morning when he sits on the side of bed. He states that usually he sits there for minute and then it passes. This morning however he was unable to stand up do to the dizziness. He also reports pain in his back particularly on the left side. He admits to chronic pain in this area. Associated symptoms include coughing and 4-5 episodes of diarrhea. However he does report that he keeps "loose stool". He denies any chest pain palpitation shortness of breath headache syncope or near-syncope. He denies any dysuria hematuria frequency or urgency. He denies abdominal pain nausea vomiting.  Workup in the emergency department includes a basic metabolic panel significant for potassium level of 6.7 repeated for accuracy it was 6.6, sodium of 134, urinalysis is unremarkable, stool for C. difficile negative, FOBT negative. Initial troponin is negative and his lipase is 94. CT of the abdomen without acute abnormality. KG with a flutter and a right bundle branch block.  At the time of my exam he is afebrile hemodynamically stable and not hypoxic. While in the emergency department he is provided with calcium gluconate, bicarbonate, insulin and dextrose IV and Kayexalate. Review of Systems:    10 point review of systems completed and all systems are negative except as indicated in the history of present illness  Past Medical History  Diagnosis Date  . Colon cancer     Status post partial colectomy  . Hypertension   . Hypercholesterolemia   . Blood transfusion   . Coronary artery disease 2011    Non obstructive CAD  . DVT (deep venous thrombosis)   . PE (pulmonary embolism)   . Diabetes mellitus without complication   . A-fib   . Diabetes mellitus, type II   . Ventral hernia   . Right bundle branch block   . Unspecified hypothyroidism   . LVH (left ventricular hypertrophy) 10/10/2012    Ejection fraction 60-65%.  . Bilateral renal cysts   . Calculus of left kidney     non-obstructing  . COPD (chronic obstructive pulmonary disease)   . CHF (congestive heart failure)    Past Surgical History  Procedure Laterality Date  . Abdominal surgery      x 3  . Cardiac catheterization  2008   Social History:  reports that he quit smoking about 40 years ago. His smoking use included Cigarettes. He started smoking about 70 years ago. He smoked 0.50 packs per day. He has quit using smokeless tobacco. His smokeless tobacco use included Chew. He reports that he does not drink alcohol or use illicit drugs. He lives alone is fairly independent with his ADLs he does have children who live close by and transported him to doctor's visits etc. he states he has assistance during the day Monday through Friday as well Allergies  Allergen Reactions  . Codeine Shortness  Of Breath    Shortness of breath    No family history on file. name and medical history is reviewed and noncontributory to the admission of this elderly gentleman  Prior to Admission medications   Medication Sig Start Date End Date Taking? Authorizing Provider  albuterol (PROVENTIL HFA) 108 (90 BASE) MCG/ACT inhaler Inhale 2 puffs into the lungs every 6 (six) hours as needed for wheezing or shortness of breath.   Yes  Historical Provider, MD  aspirin 81 MG chewable tablet Chew 81 mg by mouth daily.   Yes Historical Provider, MD  atorvastatin (LIPITOR) 80 MG tablet Take 80 mg by mouth daily.   Yes Historical Provider, MD  enoxaparin (LOVENOX) 100 MG/ML injection Inject 100 mg into the skin daily.   Yes Historical Provider, MD  famotidine (PEPCID) 20 MG tablet Take 1 tablet (20 mg total) by mouth 2 (two) times daily. 12/06/12  Yes Lezlie Octave Black, NP  furosemide (LASIX) 20 MG tablet Take 1 tablet (20 mg total) by mouth daily as needed for fluid or edema. 09/09/13  Yes Costin Karlyne Greenspan, MD  levothyroxine (SYNTHROID, LEVOTHROID) 50 MCG tablet Take 50 mcg by mouth daily before breakfast.   Yes Historical Provider, MD  lisinopril (PRINIVIL,ZESTRIL) 10 MG tablet Take 5 mg by mouth daily.   Yes Historical Provider, MD  meclizine (ANTIVERT) 25 MG tablet Take 1 tablet (25 mg total) by mouth 3 (three) times daily as needed for dizziness. 11/16/13  Yes Janice Norrie, MD  metoprolol tartrate (LOPRESSOR) 25 MG tablet Take 12.5 mg by mouth 2 (two) times daily.   Yes Historical Provider, MD   Physical Exam: Filed Vitals:   11/20/13 1418 11/20/13 1430 11/20/13 1500 11/20/13 1601  BP: 119/71 112/64 115/69   Pulse: 80     Resp: 19 20 19    Height:    5\' 8"  (1.727 m)  Weight:    73.074 kg (161 lb 1.6 oz)  SpO2: 99%       Wt Readings from Last 3 Encounters:  11/20/13 73.074 kg (161 lb 1.6 oz)  11/16/13 77.565 kg (171 lb)  11/14/13 77.565 kg (171 lb)    General:  Appears calm and comfortable Eyes: PERRL, normal lids, irises & conjunctiva ENT: grossly normal hearing, because membranes of his mouth are pink but dry very poor dentition Neck: no LAD, masses or thyromegaly Cardiovascular: RRR, no m/r/g. No LE edema.  Respiratory: CTA bilaterally, no w/r/r. Normal respiratory effort. Abdomen: soft, ntnd rounded but nondistended positive bowel sounds throughout Skin: no rash or induration seen on limited exam Musculoskeletal:  grossly normal tone BUE/BLE Psychiatric: grossly normal mood and affect, speech fluent and appropriate Neurologic: grossly non-focal. Speech is clear facial symmetry           Labs on Admission:  Basic Metabolic Panel:  Recent Labs Lab 11/16/13 0839 11/20/13 1052 11/20/13 1302  NA 132* 134*  --   K 5.5* 6.7* 6.6*  CL 95* 99  --   CO2 22 22  --   GLUCOSE 108* 101*  --   BUN 32* 30*  --   CREATININE 1.31 1.35  --   CALCIUM 10.2 10.3  --    Liver Function Tests:  Recent Labs Lab 11/16/13 0839 11/20/13 1052  AST 20 20  ALT 10 11  ALKPHOS 97 93  BILITOT 0.8 0.4  PROT 7.7 8.2  ALBUMIN 4.3 4.5    Recent Labs Lab 11/20/13 1052  LIPASE 94*   No results found for  this basename: AMMONIA,  in the last 168 hours CBC:  Recent Labs Lab 11/16/13 0949 11/20/13 1052  WBC 3.6* 4.1  NEUTROABS 1.0* 1.1*  HGB 15.4 14.4  HCT 45.2 42.3  MCV 96.0 96.1  PLT 161 172   Cardiac Enzymes:  Recent Labs Lab 11/16/13 0839 11/20/13 1052  TROPONINI <0.30 <0.30    BNP (last 3 results)  Recent Labs  06/26/13 0401 07/26/13 1128 11/16/13 0839  PROBNP 1461.0* 2036.0* 770.7*   CBG:  Recent Labs Lab 11/20/13 1527  GLUCAP 113*    Radiological Exams on Admission: Ct Renal Stone Study  11/20/2013   CLINICAL DATA:  Left lower quadrant pain for 2-3 days. History of prostate and colon cancer.  EXAM: CT ABDOMEN AND PELVIS WITHOUT CONTRAST  TECHNIQUE: Multidetector CT imaging of the abdomen and pelvis was performed following the standard protocol without IV contrast.  COMPARISON:  CT abdomen and pelvis 04/23/2013.  FINDINGS: The lung bases demonstrate emphysematous disease. There is linear scarring in the bases, unchanged. No pleural or pericardial effusion. Calcific coronary artery disease is noted.  Likely small right and 2 small left renal stones are identified. Calcifications may represent vascular calcifications. There is no hydronephrosis on the right or left and no ureteral or  urinary bladder stones are identified. Bilateral renal cysts are unchanged.  A few small stones are seen layering dependently in the fundus of the gallbladder. The liver, biliary tree, pancreas and adrenal glands are unremarkable. Single calcification the spleen is noted. Retroperitoneal calcification on the right is unchanged. There is extensive aortoiliac atherosclerosis. The left common iliac artery is mildly dilated at 1.6 cm, unchanged. Small fat containing inguinal hernias are noted. Scattered colonic diverticula without diverticulitis are seen. The appendix and stomach are unremarkable. Two ventral hernias contains loops of small bowel obstruction without complicating feature are unchanged. No lymphadenopathy or fluid.  Lower lumbar spondylosis is identified. There is bilateral hip osteoarthritis which appears advanced.  IMPRESSION: No acute finding.  Unchanged midline ventral hernias contain loops of small bowel without obstruction.  Small nonobstructing bilateral renal stones versus vascular calcifications. Negative for hydronephrosis or ureteral stone.  Extensive atherosclerotic vascular disease with a small left common iliac artery aneurysm.  Emphysema.  Small gallstones without evidence of cholecystitis.   Electronically Signed   By: Inge Rise M.D.   On: 11/20/2013 13:59    EKG: Independently reviewed a flutter with a right bundle branch block  Assessment/Plan Principal Problem:   Hyperkalemia: Etiology unclear. Will admit to the floor for observation. Will recheck this evening and if not trending down after treatment provided in the emergency department we'll consider a second dose of Kayexalate. Will also provide gentle IV fluids. Monitor  Active Problems:   HTN (hypertension): Fair control in the emergency department. Home medications include Lasix on an as-needed basis. Lisinopril and Lopressor have been discontinued due to 2 history of orthostatic hypotension, bradycardia and  dizziness. I will hold the Lasix for now. Will monitor daily weights and hold Lasix until indicated    Type 2 diabetes mellitus: Must BE diet controlled. Will obtain hemoglobin A1c. Will monitor CBGs and use sliding scale insulin for optimal control   Atrial fibrillation with slow ventricular response: EKG with a flutter and right bundle branch block. Chart review indicates patient was seen by his cardiologist 2 weeks ago. His note indicates long-standing conduction system disease with atrial fibrillation and right bundle branch block and left anterior fascicular block. Recommendations at that visit were to continue xarelto.  Patient's home meds do not include Xarelto. Will investigate and resume as indicated.   Chronic diastolic congestive heart failure: Reason cardiology note indicates history of chronic systolic and diastolic heart failure with an EF of 40-50% with diffuse hypokinesis in June of this year. Patient also with a history of dietary noncompliance, accelerated hypertension. Etiology note indicates history of problems with bradycardia and orthostatic hypotension and dizziness so he is off his beta blocker and ACE inhibitor and only takes Lasix when needed    Prostate cancer: Stable at baseline     Anemia: Stable at baseline. No signs symptoms of bleeding    Dementia: Stable at baseline    Code Status: full DVT Prophylaxis: Family Communication: none present Disposition Plan: home hopefully tomorrow  Time spent: 102 minutes  [facility] Pager [phone]

## 2013-11-20 NOTE — ED Provider Notes (Signed)
CSN: [id number]     Arrival date & time 11/20/13  1036 History   This chart was scribed for Ezequiel Essex, MD by Edison Simon, ED Scribe. This patient was seen in room A315/A315-01 and the patient's care was started at 10:54 AM.    Chief Complaint  Patient presents with  . Flank Pain   The history is provided by the patient and a relative. No language interpreter was used.   HPI Comments: Bryce Mcdonald is a 80 y.o. male who presents to the Emergency Department complaining of constant pain to his left flank and abdomen with onset this morning, though he reports chronic diffuse aches which he states is different from his main complaint. He states his pain is improved at this time. He reports associated coughing. He reports coughing up some blood 3 weeks ago. Per daughter, he spat up "some orange stuff" this morning not associated with coughing. He also reports diarrhea 4-5 times a day which he has chronically. He denies being on antibiotics recently. He was seen here last week for dizziness which he states has worsened. Per daughter, he is more confused than normal. He denies use of blood thinners. He reports history of diabetes. He reports history of cancer for which he  He also reports chronic diffuse pain.He denies vomiting, dysuria, hematuria, chest pain, testicular pain, or blood in stool.  Past Medical History  Diagnosis Date  . Colon cancer     Status post partial colectomy  . Hypertension   . Hypercholesterolemia   . Blood transfusion   . Coronary artery disease 2011    Non obstructive CAD  . DVT (deep venous thrombosis)   . PE (pulmonary embolism)   . Diabetes mellitus without complication   . A-fib   . Diabetes mellitus, type II   . Ventral hernia   . Right bundle branch block   . Unspecified hypothyroidism   . LVH (left ventricular hypertrophy) 10/10/2012    Ejection fraction 60-65%.  . Bilateral renal cysts   . Calculus of left kidney     non-obstructing  . COPD (chronic  obstructive pulmonary disease)   . CHF (congestive heart failure)    Past Surgical History  Procedure Laterality Date  . Abdominal surgery      x 3  . Cardiac catheterization  2008   No family history on file. History  Substance Use Topics  . Smoking status: Former Smoker -- 0.50 packs/day    Types: Cigarettes    Start date: 11/15/1943    Quit date: 02/20/1973  . Smokeless tobacco: Former Systems developer    Types: Chew     Comment: quit over 40 years ago  . Alcohol Use: No     Comment: quit over forty years ago    Review of Systems  Respiratory: Positive for cough (with blood 3 weeks ago).   Cardiovascular: Negative for chest pain.  Gastrointestinal: Positive for abdominal pain and diarrhea. Negative for vomiting and blood in stool.  Genitourinary: Positive for flank pain. Negative for dysuria, hematuria and testicular pain.  Neurological: Positive for dizziness.  All other systems reviewed and are negative. A complete 10 system review of systems was obtained and all systems are negative except as noted in the HPI and PMH.    Allergies  Codeine  Home Medications   Prior to Admission medications   Medication Sig Start Date End Date Taking? Authorizing Provider  albuterol (PROVENTIL HFA) 108 (90 BASE) MCG/ACT inhaler Inhale 2 puffs into the lungs every  6 (six) hours as needed for wheezing or shortness of breath.   Yes Historical Provider, MD  aspirin 81 MG chewable tablet Chew 81 mg by mouth daily.   Yes Historical Provider, MD  atorvastatin (LIPITOR) 80 MG tablet Take 80 mg by mouth daily.   Yes Historical Provider, MD  enoxaparin (LOVENOX) 100 MG/ML injection Inject 100 mg into the skin daily.   Yes Historical Provider, MD  famotidine (PEPCID) 20 MG tablet Take 1 tablet (20 mg total) by mouth 2 (two) times daily. 12/06/12  Yes Lezlie Octave Black, NP  furosemide (LASIX) 20 MG tablet Take 1 tablet (20 mg total) by mouth daily as needed for fluid or edema. 09/09/13  Yes Costin Karlyne Greenspan, MD   levothyroxine (SYNTHROID, LEVOTHROID) 50 MCG tablet Take 50 mcg by mouth daily before breakfast.   Yes Historical Provider, MD  lisinopril (PRINIVIL,ZESTRIL) 10 MG tablet Take 5 mg by mouth daily.   Yes Historical Provider, MD  meclizine (ANTIVERT) 25 MG tablet Take 1 tablet (25 mg total) by mouth 3 (three) times daily as needed for dizziness. 11/16/13  Yes Janice Norrie, MD  metoprolol tartrate (LOPRESSOR) 25 MG tablet Take 12.5 mg by mouth 2 (two) times daily.   Yes Historical Provider, MD   BP 115/69  Pulse 80  Resp 19  Ht 5\' 8"  (1.727 m)  Wt 161 lb 1.6 oz (73.074 kg)  BMI 24.50 kg/m2  SpO2 99% Physical Exam  Nursing note and vitals reviewed. Constitutional: He is oriented to person, place, and time. He appears well-developed and well-nourished. No distress.  HENT:  Head: Normocephalic and atraumatic.  Mouth/Throat: Oropharynx is clear and moist. No oropharyngeal exudate.  Dry mucous membranes  Eyes: Conjunctivae and EOM are normal. Pupils are equal, round, and reactive to light.  Neck: Normal range of motion. Neck supple.  No meningismus.  Cardiovascular: Normal rate, regular rhythm, normal heart sounds and intact distal pulses.   No murmur heard. equal femoral pulses, equal DP and PT pulse  Pulmonary/Chest: Effort normal and breath sounds normal. No respiratory distress.  Abdominal: Soft. There is no tenderness. There is no rebound and no guarding.  Midline abdominal incision well healed  Genitourinary:  no CVA tend  Musculoskeletal: Normal range of motion. He exhibits no edema and no tenderness.  Neurological: He is alert and oriented to person, place, and time. No cranial nerve deficit. He exhibits normal muscle tone. Coordination normal.  No ataxia on finger to nose bilaterally. No pronator drift. 5/5 strength throughout. CN 2-12 intact. Negative Romberg. Equal grip strength. Sensation intact. Gait is normal.   Skin: Skin is warm.  Psychiatric: He has a normal mood and  affect. His behavior is normal.    ED Course  Procedures (including critical care time) Labs Review Labs Reviewed  URINALYSIS, ROUTINE W REFLEX MICROSCOPIC - Abnormal; Notable for the following:    Specific Gravity, Urine <1.005 (*)    All other components within normal limits  CBC WITH DIFFERENTIAL - Abnormal; Notable for the following:    Neutrophils Relative % 28 (*)    Neutro Abs 1.1 (*)    Lymphocytes Relative 60 (*)    All other components within normal limits  COMPREHENSIVE METABOLIC PANEL - Abnormal; Notable for the following:    Sodium 134 (*)    Potassium 6.7 (*)    Glucose, Bld 101 (*)    BUN 30 (*)    GFR calc non Af Amer 45 (*)    GFR calc Af  Amer 52 (*)    All other components within normal limits  LIPASE, BLOOD - Abnormal; Notable for the following:    Lipase 94 (*)    All other components within normal limits  POTASSIUM - Abnormal; Notable for the following:    Potassium 6.6 (*)    All other components within normal limits  BASIC METABOLIC PANEL - Abnormal; Notable for the following:    Sodium 135 (*)    BUN 27 (*)    GFR calc non Af Amer 51 (*)    GFR calc Af Amer 59 (*)    All other components within normal limits  CBG MONITORING, ED - Abnormal; Notable for the following:    Glucose-Capillary 113 (*)    All other components within normal limits  CLOSTRIDIUM DIFFICILE BY PCR  TROPONIN I  LACTIC ACID, PLASMA  GLUCOSE, CAPILLARY  TSH  HEMOGLOBIN K7Q  BASIC METABOLIC PANEL  CBC  POC OCCULT BLOOD, ED    Imaging Review Ct Renal Stone Study  11/20/2013   CLINICAL DATA:  Left lower quadrant pain for 2-3 days. History of prostate and colon cancer.  EXAM: CT ABDOMEN AND PELVIS WITHOUT CONTRAST  TECHNIQUE: Multidetector CT imaging of the abdomen and pelvis was performed following the standard protocol without IV contrast.  COMPARISON:  CT abdomen and pelvis 04/23/2013.  FINDINGS: The lung bases demonstrate emphysematous disease. There is linear scarring in  the bases, unchanged. No pleural or pericardial effusion. Calcific coronary artery disease is noted.  Likely small right and 2 small left renal stones are identified. Calcifications may represent vascular calcifications. There is no hydronephrosis on the right or left and no ureteral or urinary bladder stones are identified. Bilateral renal cysts are unchanged.  A few small stones are seen layering dependently in the fundus of the gallbladder. The liver, biliary tree, pancreas and adrenal glands are unremarkable. Single calcification the spleen is noted. Retroperitoneal calcification on the right is unchanged. There is extensive aortoiliac atherosclerosis. The left common iliac artery is mildly dilated at 1.6 cm, unchanged. Small fat containing inguinal hernias are noted. Scattered colonic diverticula without diverticulitis are seen. The appendix and stomach are unremarkable. Two ventral hernias contains loops of small bowel obstruction without complicating feature are unchanged. No lymphadenopathy or fluid.  Lower lumbar spondylosis is identified. There is bilateral hip osteoarthritis which appears advanced.  IMPRESSION: No acute finding.  Unchanged midline ventral hernias contain loops of small bowel without obstruction.  Small nonobstructing bilateral renal stones versus vascular calcifications. Negative for hydronephrosis or ureteral stone.  Extensive atherosclerotic vascular disease with a small left common iliac artery aneurysm.  Emphysema.  Small gallstones without evidence of cholecystitis.   Electronically Signed   By: Inge Rise M.D.   On: 11/20/2013 13:59     EKG Interpretation   Date/Time:  Thursday November 20 2013 10:42:32 EDT Ventricular Rate:  54 PR Interval:    QRS Duration: 128 QT Interval:  432 QTC Calculation: 409 R Axis:   -92 Text Interpretation:  Atrial flutter RBBB and LAFB No significant change  was found Confirmed by Marijean Montanye  MD, Karman Veney ([id number]) on 11/20/2013 10:46:32   AM     DIAGNOSTIC STUDIES: Oxygen Saturation is 97% on room air, normal by my interpretation.    COORDINATION OF CARE: 6:28 PM Discussed treatment plan with patient at beside, the patient agrees with the plan and has no further questions at this time.    MDM   Final diagnoses:  Hyperkalemia   Patient  complains of left flank pain and abdominal pain onset about one hour ago, now resolved. He endorses one week history of diffuse body aches with diarrhea up to 4 or 5 times a day. No fever or vomiting. Recent evaluation for dizziness last week.  UA negative. Abdomen soft and nontender. Hemoccult-negative. Potassium 6.7 normal renal function. Will recheck  Potassium 6.6 on recheck. We'll treat with calcium gluconate, bicarbonate, insulin dextrose and Kayexalate. CT abdomen without acute abnormality. Stable ventral hernias. Stable iliac artery aneurysm.  Patient states he is feeling better. Denies any symptoms currently.  Admission for hyperkalemia dw Dr. Wyline Copas. Patient remains symptom free.      I personally performed the services described in this documentation, which was scribed in my presence. The recorded information has been reviewed and is accurate.   Ezequiel Essex, MD 11/20/13 [phone]

## 2013-11-21 LAB — BASIC METABOLIC PANEL
ANION GAP: 11 (ref 5–15)
BUN: 22 mg/dL (ref 6–23)
CO2: 24 mEq/L (ref 19–32)
CREATININE: 1.09 mg/dL (ref 0.50–1.35)
Calcium: 9.1 mg/dL (ref 8.4–10.5)
Chloride: 103 mEq/L (ref 96–112)
GFR calc Af Amer: 67 mL/min — ABNORMAL LOW (ref >90)
GFR, EST NON AFRICAN AMERICAN: 58 mL/min — AB (ref >90)
Glucose, Bld: 90 mg/dL (ref 70–99)
Potassium: 4.6 mEq/L (ref 3.7–5.3)
Sodium: 138 mEq/L (ref 137–147)

## 2013-11-21 LAB — GLUCOSE, CAPILLARY
GLUCOSE-CAPILLARY: 95 mg/dL (ref 70–99)
Glucose-Capillary: 94 mg/dL (ref 70–99)
Glucose-Capillary: 92 mg/dL (ref 70–99)

## 2013-11-21 LAB — TSH: TSH: 5.79 u[IU]/mL — ABNORMAL HIGH (ref 0.350–4.500)

## 2013-11-21 LAB — HEMOGLOBIN A1C
HEMOGLOBIN A1C: 6.7 % — AB (ref <5.7)
MEAN PLASMA GLUCOSE: 146 mg/dL — AB (ref <117)

## 2013-11-21 LAB — CBC
HCT: 35 % — ABNORMAL LOW (ref 39.0–52.0)
HEMOGLOBIN: 11.8 g/dL — AB (ref 13.0–17.0)
MCH: 32.2 pg (ref 26.0–34.0)
MCHC: 33.7 g/dL (ref 30.0–36.0)
MCV: 95.6 fL (ref 78.0–100.0)
Platelets: 146 10*3/uL — ABNORMAL LOW (ref 150–400)
RBC: 3.66 MIL/uL — ABNORMAL LOW (ref 4.22–5.81)
RDW: 14.5 % (ref 11.5–15.5)
WBC: 4.1 10*3/uL (ref 4.0–10.5)

## 2013-11-21 NOTE — Care Management Note (Signed)
    Page 1 of 1   11/21/2013     12:45:29 PM CARE MANAGEMENT NOTE 11/21/2013  Patient:  Bryce Mcdonald, Bryce Mcdonald   Account Number:  [id number]  Date Initiated:  11/21/2013  Documentation initiated by:  Theophilus Kinds  Subjective/Objective Assessment:   Pt admitted from home with hyperkalemia. Pt lives alone in an apartment  and will return home at discharge. Pt has an aide M-F 2 hours a day and has a daughter who is very active in the care of the pt. Pt has a walker for home use.     Action/Plan:   Pt is a [facility] pt and they have been notified of pts admission. Pt discharged home today. Pts PCP is Dr. Niger Reid.   Anticipated DC Date:  11/21/2013   Anticipated DC Plan:  [place]  CM consult      Choice offered to / List presented to:             Status of service:  Completed, signed off Medicare Important Message given?   (If response is "NO", the following Medicare IM given date fields will be blank) Date Medicare IM given:   Medicare IM given by:   Date Additional Medicare IM given:   Additional Medicare IM given by:    Discharge Disposition:  HOME/SELF CARE  Per UR Regulation:    If discussed at Long Length of Stay Meetings, dates discussed:    Comments:  11/21/13 [place], RN BSN CM

## 2013-11-21 NOTE — Progress Notes (Signed)
UR completed 

## 2013-11-21 NOTE — Discharge Summary (Signed)
Physician Discharge Summary  Bryce Mcdonald SVX:[id number] DOB: 1921/03/27 DOA: 11/20/2013  PCP: PROVIDER NOT IN SYSTEM  Admit date: 11/20/2013 Discharge date: 11/21/2013  Time spent: 35 minutes  Recommendations for Outpatient Follow-up:  1. Follow up with PCP in 1-2 weeks 2. Would repeat BMET in 1-2 weeks  Discharge Diagnoses:  Principal Problem:   Hyperkalemia Active Problems:   HTN (hypertension)   Prostate cancer   Atrial fibrillation with slow ventricular response   Type 2 diabetes mellitus   Anemia   Dementia   Chronic diastolic congestive heart failure  Discharge Condition: Improved  Diet recommendation: Diabetic, heart healthy  Filed Weights   11/20/13 1043 11/20/13 1601 11/21/13 0443  Weight: 73.936 kg (163 lb) 73.074 kg (161 lb 1.6 oz) 75.6 kg (166 lb 10.7 oz)   History of present illness:  Please see admit h and p from 10/1 for details. Briefly, pt presents initially with flank pains and dizziness that had since resolved in the ED. The patient was incidentally noted to have a K of 6.7. The patient was given calcium, bicarb, insulin with dextrose, and kayexalate and admitted for observation.  Hospital Course:  The patient was admitted to the floor. Patient's home lisinopril was held secondary to presenting hyperkalemia. The patient's blood pressures remained borderline low/normotensive. The patient remained stable with repeat potassium normalized. The patient remained stable for discharge for close outpatient followup.  Discharge Exam: Filed Vitals:   11/20/13 1601 11/20/13 2213 11/21/13 0443 11/21/13 0649  BP:  110/65  100/42  Pulse:  53  43  Temp:  97.1 F (36.2 C)  98.3 F (36.8 C)  TempSrc:  Oral  Oral  Resp:  20    Height: 5\' 8"  (1.727 m)     Weight: 73.074 kg (161 lb 1.6 oz)  75.6 kg (166 lb 10.7 oz)   SpO2:  98%  97%    General: Awake, in nad Cardiovascular: regular, s1, s2 Respiratory: normal resp effort, no wheezing  Discharge  Instructions     Medication List    STOP taking these medications       lisinopril 10 MG tablet  Commonly known as:  PRINIVIL,ZESTRIL      TAKE these medications       aspirin 81 MG chewable tablet  Chew 81 mg by mouth daily.     atorvastatin 80 MG tablet  Commonly known as:  LIPITOR  Take 80 mg by mouth daily.     enoxaparin 100 MG/ML injection  Commonly known as:  LOVENOX  Inject 100 mg into the skin daily.     famotidine 20 MG tablet  Commonly known as:  PEPCID  Take 1 tablet (20 mg total) by mouth 2 (two) times daily.     furosemide 20 MG tablet  Commonly known as:  LASIX  Take 1 tablet (20 mg total) by mouth daily as needed for fluid or edema.     levothyroxine 50 MCG tablet  Commonly known as:  SYNTHROID, LEVOTHROID  Take 50 mcg by mouth daily before breakfast.     meclizine 25 MG tablet  Commonly known as:  ANTIVERT  Take 1 tablet (25 mg total) by mouth 3 (three) times daily as needed for dizziness.     metoprolol tartrate 25 MG tablet  Commonly known as:  LOPRESSOR  Take 12.5 mg by mouth 2 (two) times daily.     PROVENTIL HFA 108 (90 BASE) MCG/ACT inhaler  Generic drug:  albuterol  Inhale 2 puffs into the lungs  every 6 (six) hours as needed for wheezing or shortness of breath.       Allergies  Allergen Reactions  . Codeine Shortness Of Breath    Shortness of breath   Follow-up Information   Follow up with Follow up with your PCP in 1-2 weeks. Schedule an appointment as soon as possible for a visit in 1 week.       The results of significant diagnostics from this hospitalization (including imaging, microbiology, ancillary and laboratory) are listed below for reference.    Significant Diagnostic Studies: Dg Chest 2 View  11/16/2013   CLINICAL DATA:  Shortness of breath and dizziness  EXAM: CHEST - 2 VIEW  COMPARISON:  08/10/2013  FINDINGS: Stable hyperinflation with bibasilar scarring, compatible COPD/emphysema. Normal heart size and  vascularity. No new airspace process, collapse or consolidation. Negative for edema, large effusion or pneumothorax. Trachea midline. Atherosclerosis of the aorta.  IMPRESSION: Stable COPD/emphysema pattern with basilar scarring.   Electronically Signed   By: Daryll Brod M.D.   On: 11/16/2013 10:07   Ct Head Wo Contrast  11/16/2013   CLINICAL DATA:  Dizziness  EXAM: CT HEAD WITHOUT CONTRAST  TECHNIQUE: Contiguous axial images were obtained from the base of the skull through the vertex without contrast.  COMPARISON:  10/16/2010  FINDINGS: Stable diffuse brain atrophy and chronic white matter microvascular ischemic changes throughout the cerebrum. No acute intracranial hemorrhage, mass lesion, definite infarction, midline shift, herniation, hydrocephalus, or extra-axial fluid collection. Cisterns patent. Cerebellar atrophy as well. Mastoids and sinuses clear.  IMPRESSION: Stable brain atrophy and chronic white matter microvascular changes. No interval change or acute process.   Electronically Signed   By: Daryll Brod M.D.   On: 11/16/2013 09:55   Ct Renal Stone Study  11/20/2013   CLINICAL DATA:  Left lower quadrant pain for 2-3 days. History of prostate and colon cancer.  EXAM: CT ABDOMEN AND PELVIS WITHOUT CONTRAST  TECHNIQUE: Multidetector CT imaging of the abdomen and pelvis was performed following the standard protocol without IV contrast.  COMPARISON:  CT abdomen and pelvis 04/23/2013.  FINDINGS: The lung bases demonstrate emphysematous disease. There is linear scarring in the bases, unchanged. No pleural or pericardial effusion. Calcific coronary artery disease is noted.  Likely small right and 2 small left renal stones are identified. Calcifications may represent vascular calcifications. There is no hydronephrosis on the right or left and no ureteral or urinary bladder stones are identified. Bilateral renal cysts are unchanged.  A few small stones are seen layering dependently in the fundus of the  gallbladder. The liver, biliary tree, pancreas and adrenal glands are unremarkable. Single calcification the spleen is noted. Retroperitoneal calcification on the right is unchanged. There is extensive aortoiliac atherosclerosis. The left common iliac artery is mildly dilated at 1.6 cm, unchanged. Small fat containing inguinal hernias are noted. Scattered colonic diverticula without diverticulitis are seen. The appendix and stomach are unremarkable. Two ventral hernias contains loops of small bowel obstruction without complicating feature are unchanged. No lymphadenopathy or fluid.  Lower lumbar spondylosis is identified. There is bilateral hip osteoarthritis which appears advanced.  IMPRESSION: No acute finding.  Unchanged midline ventral hernias contain loops of small bowel without obstruction.  Small nonobstructing bilateral renal stones versus vascular calcifications. Negative for hydronephrosis or ureteral stone.  Extensive atherosclerotic vascular disease with a small left common iliac artery aneurysm.  Emphysema.  Small gallstones without evidence of cholecystitis.   Electronically Signed   By: Inge Rise M.D.  On: 11/20/2013 13:59    Microbiology: Recent Results (from the past 240 hour(s))  CLOSTRIDIUM DIFFICILE BY PCR     Status: None   Collection Time    11/20/13  2:33 PM      Result Value Ref Range Status   C difficile by pcr NEGATIVE  NEGATIVE Final     Labs: Basic Metabolic Panel:  Recent Labs Lab 11/16/13 0839 11/20/13 1052 11/20/13 1302 11/20/13 1636 11/21/13 0549  NA 132* 134*  --  135* 138  K 5.5* 6.7* 6.6* 5.3 4.6  CL 95* 99  --  102 103  CO2 22 22  --  19 24  GLUCOSE 108* 101*  --  71 90  BUN 32* 30*  --  27* 22  CREATININE 1.31 1.35  --  1.21 1.09  CALCIUM 10.2 10.3  --  9.9 9.1   Liver Function Tests:  Recent Labs Lab 11/16/13 0839 11/20/13 1052  AST 20 20  ALT 10 11  ALKPHOS 97 93  BILITOT 0.8 0.4  PROT 7.7 8.2  ALBUMIN 4.3 4.5    Recent  Labs Lab 11/20/13 1052  LIPASE 94*   No results found for this basename: AMMONIA,  in the last 168 hours CBC:  Recent Labs Lab 11/16/13 0949 11/20/13 1052 11/21/13 0549  WBC 3.6* 4.1 4.1  NEUTROABS 1.0* 1.1*  --   HGB 15.4 14.4 11.8*  HCT 45.2 42.3 35.0*  MCV 96.0 96.1 95.6  PLT 161 172 146*   Cardiac Enzymes:  Recent Labs Lab 11/16/13 0839 11/20/13 1052  TROPONINI <0.30 <0.30   BNP: BNP (last 3 results)  Recent Labs  06/26/13 0401 07/26/13 1128 11/16/13 0839  PROBNP 1461.0* 2036.0* 770.7*   CBG:  Recent Labs Lab 11/20/13 1527 11/20/13 1634 11/20/13 2209 11/21/13 0734 11/21/13 1131  GLUCAP 113* 74 109* 95 94   Signed:  CHIU, STEPHEN K  [facility] 11/21/2013, 12:44 PM

## 2014-01-10 ENCOUNTER — Encounter (HOSPITAL_COMMUNITY): Payer: Self-pay | Admitting: Emergency Medicine

## 2014-01-10 ENCOUNTER — Emergency Department (HOSPITAL_COMMUNITY)
Admission: EM | Admit: 2014-01-10 | Discharge: 2014-01-10 | Disposition: A | Discharge status: Home / Self Care | Payer: Non-veteran care | Attending: Emergency Medicine | Admitting: Emergency Medicine

## 2014-01-10 ENCOUNTER — Emergency Department (HOSPITAL_COMMUNITY): Payer: Non-veteran care

## 2014-01-10 DIAGNOSIS — I509 Heart failure, unspecified: Secondary | ICD-10-CM | POA: Insufficient documentation

## 2014-01-10 DIAGNOSIS — Z7982 Long term (current) use of aspirin: Secondary | ICD-10-CM | POA: Insufficient documentation

## 2014-01-10 DIAGNOSIS — E039 Hypothyroidism, unspecified: Secondary | ICD-10-CM | POA: Diagnosis not present

## 2014-01-10 DIAGNOSIS — Z79899 Other long term (current) drug therapy: Secondary | ICD-10-CM | POA: Insufficient documentation

## 2014-01-10 DIAGNOSIS — Z87891 Personal history of nicotine dependence: Secondary | ICD-10-CM | POA: Diagnosis not present

## 2014-01-10 DIAGNOSIS — Z7952 Long term (current) use of systemic steroids: Secondary | ICD-10-CM | POA: Insufficient documentation

## 2014-01-10 DIAGNOSIS — E780 Pure hypercholesterolemia: Secondary | ICD-10-CM | POA: Insufficient documentation

## 2014-01-10 DIAGNOSIS — E119 Type 2 diabetes mellitus without complications: Secondary | ICD-10-CM | POA: Diagnosis not present

## 2014-01-10 DIAGNOSIS — J441 Chronic obstructive pulmonary disease with (acute) exacerbation: Secondary | ICD-10-CM | POA: Insufficient documentation

## 2014-01-10 DIAGNOSIS — Z86711 Personal history of pulmonary embolism: Secondary | ICD-10-CM | POA: Insufficient documentation

## 2014-01-10 DIAGNOSIS — Z85038 Personal history of other malignant neoplasm of large intestine: Secondary | ICD-10-CM | POA: Diagnosis not present

## 2014-01-10 DIAGNOSIS — Z87442 Personal history of urinary calculi: Secondary | ICD-10-CM | POA: Diagnosis not present

## 2014-01-10 DIAGNOSIS — Z86718 Personal history of other venous thrombosis and embolism: Secondary | ICD-10-CM | POA: Diagnosis not present

## 2014-01-10 DIAGNOSIS — I10 Essential (primary) hypertension: Secondary | ICD-10-CM | POA: Insufficient documentation

## 2014-01-10 DIAGNOSIS — Z9889 Other specified postprocedural states: Secondary | ICD-10-CM | POA: Insufficient documentation

## 2014-01-10 DIAGNOSIS — I2510 Atherosclerotic heart disease of native coronary artery without angina pectoris: Secondary | ICD-10-CM | POA: Insufficient documentation

## 2014-01-10 DIAGNOSIS — R531 Weakness: Secondary | ICD-10-CM | POA: Diagnosis present

## 2014-01-10 DIAGNOSIS — R0602 Shortness of breath: Secondary | ICD-10-CM

## 2014-01-10 LAB — CBC WITH DIFFERENTIAL/PLATELET
Basophils Absolute: 0 10*3/uL (ref 0.0–0.1)
Basophils Relative: 1 % (ref 0–1)
EOS ABS: 0 10*3/uL (ref 0.0–0.7)
Eosinophils Relative: 1 % (ref 0–5)
HEMATOCRIT: 42.1 % (ref 39.0–52.0)
HEMOGLOBIN: 13.8 g/dL (ref 13.0–17.0)
LYMPHS ABS: 1.8 10*3/uL (ref 0.7–4.0)
Lymphocytes Relative: 54 % — ABNORMAL HIGH (ref 12–46)
MCH: 33.1 pg (ref 26.0–34.0)
MCHC: 32.8 g/dL (ref 30.0–36.0)
MCV: 101 fL — ABNORMAL HIGH (ref 78.0–100.0)
MONO ABS: 0.5 10*3/uL (ref 0.1–1.0)
MONOS PCT: 14 % — AB (ref 3–12)
NEUTROS PCT: 30 % — AB (ref 43–77)
Neutro Abs: 1 10*3/uL — ABNORMAL LOW (ref 1.7–7.7)
Platelets: 143 10*3/uL — ABNORMAL LOW (ref 150–400)
RBC: 4.17 MIL/uL — AB (ref 4.22–5.81)
RDW: 14.5 % (ref 11.5–15.5)
WBC: 3.3 10*3/uL — ABNORMAL LOW (ref 4.0–10.5)

## 2014-01-10 LAB — COMPREHENSIVE METABOLIC PANEL
ALT: 15 U/L (ref 0–53)
ANION GAP: 13 (ref 5–15)
AST: 20 U/L (ref 0–37)
Albumin: 4.2 g/dL (ref 3.5–5.2)
Alkaline Phosphatase: 110 U/L (ref 39–117)
BILIRUBIN TOTAL: 0.4 mg/dL (ref 0.3–1.2)
BUN: 23 mg/dL (ref 6–23)
CO2: 29 mEq/L (ref 19–32)
CREATININE: 1.53 mg/dL — AB (ref 0.50–1.35)
Calcium: 10.3 mg/dL (ref 8.4–10.5)
Chloride: 98 mEq/L (ref 96–112)
GFR, EST AFRICAN AMERICAN: 45 mL/min — AB (ref >90)
GFR, EST NON AFRICAN AMERICAN: 39 mL/min — AB (ref >90)
GLUCOSE: 108 mg/dL — AB (ref 70–99)
POTASSIUM: 4.3 meq/L (ref 3.7–5.3)
Sodium: 140 mEq/L (ref 137–147)
Total Protein: 8 g/dL (ref 6.0–8.3)

## 2014-01-10 LAB — PRO B NATRIURETIC PEPTIDE: Pro B Natriuretic peptide (BNP): 1366 pg/mL — ABNORMAL HIGH (ref 0–450)

## 2014-01-10 LAB — TROPONIN I

## 2014-01-10 LAB — URINALYSIS, ROUTINE W REFLEX MICROSCOPIC
BILIRUBIN URINE: NEGATIVE
GLUCOSE, UA: NEGATIVE mg/dL
HGB URINE DIPSTICK: NEGATIVE
KETONES UR: NEGATIVE mg/dL
Leukocytes, UA: NEGATIVE
Nitrite: NEGATIVE
PROTEIN: NEGATIVE mg/dL
Specific Gravity, Urine: 1.01 (ref 1.005–1.030)
Urobilinogen, UA: 0.2 mg/dL (ref 0.0–1.0)
pH: 6 (ref 5.0–8.0)

## 2014-01-10 MED ORDER — PREDNISONE 20 MG PO TABS: 60.0000 mg | ORAL_TABLET | Freq: Every day | ORAL | Status: DC

## 2014-01-10 NOTE — Discharge Instructions (Signed)
YOu were seen today for generalized weakness and shortness of breath.  Your work-up is reassuring and there does not appear to be an acute medical emergency.  You need to follow-up with your primary doctor.  Given your history of COPD, you will be given a 5 day course of steroids.  Shortness of Breath Shortness of breath means you have trouble breathing. It could also mean that you have a medical problem. You should get immediate medical care for shortness of breath. CAUSES   Not enough oxygen in the air such as with high altitudes or a smoke-filled room.  Certain lung diseases, infections, or problems.  Heart disease or conditions, such as angina or heart failure.  Low red blood cells (anemia).  Poor physical fitness, which can cause shortness of breath when you exercise.  Chest or back injuries or stiffness.  Being overweight.  Smoking.  Anxiety, which can make you feel like you are not getting enough air. DIAGNOSIS  Serious medical problems can often be found during your physical exam. Tests may also be done to determine why you are having shortness of breath. Tests may include:  Chest X-rays.  Lung function tests.  Blood tests.  An electrocardiogram (ECG).  An ambulatory electrocardiogram. An ambulatory ECG records your heartbeat patterns over a 24-hour period.  Exercise testing.  A transthoracic echocardiogram (TTE). During echocardiography, sound waves are used to evaluate how blood flows through your heart.  A transesophageal echocardiogram (TEE).  Imaging scans. Your health care provider may not be able to find a cause for your shortness of breath after your exam. In this case, it is important to have a follow-up exam with your health care provider as directed.  TREATMENT  Treatment for shortness of breath depends on the cause of your symptoms and can vary greatly. HOME CARE INSTRUCTIONS   Do not smoke. Smoking is a common cause of shortness of breath. If you  smoke, ask for help to quit.  Avoid being around chemicals or things that may bother your breathing, such as paint fumes and dust.  Rest as needed. Slowly resume your usual activities.  If medicines were prescribed, take them as directed for the full length of time directed. This includes oxygen and any inhaled medicines.  Keep all follow-up appointments as directed by your health care provider. SEEK MEDICAL CARE IF:   Your condition does not improve in the time expected.  You have a hard time doing your normal activities even with rest.  You have any new symptoms. SEEK IMMEDIATE MEDICAL CARE IF:   Your shortness of breath gets worse.  You feel light-headed, faint, or develop a cough not controlled with medicines.  You start coughing up blood.  You have pain with breathing.  You have chest pain or pain in your arms, shoulders, or abdomen.  You have a fever.  You are unable to walk up stairs or exercise the way you normally do. MAKE SURE YOU:  Understand these instructions.  Will watch your condition.  Will get help right away if you are not doing well or get worse. Document Released: 11/01/2000 Document Revised: 02/11/2013 Document Reviewed: 04/24/2011 [facility] Patient Information 2015 [place], [place]. This information is not intended to replace advice given to you by your health care provider. Make sure you discuss any questions you have with your health care provider.

## 2014-01-10 NOTE — ED Notes (Signed)
MD at bedside. 

## 2014-01-10 NOTE — ED Provider Notes (Signed)
CSN: [id number]     Arrival date & time 01/10/14  3536 History  This chart was scribed for Merryl Hacker, MD by [facility], ED Scribe. The patient was seen in [place] and the patient's care was started at 10:12 AM.    Chief Complaint  Patient presents with  . Weakness   Level 5 Caveat The history is provided by the patient. No language interpreter was used.    HPI Comments: Bryce Mcdonald is a 80 y.o. male who presents to the Emergency Department complaining of SOB and trouble sleep. He states he is unable to sleep but this is not a new complaint.  Pt states did not want to come in today but his daughter insisted he come in and he is unsure why. He is not placed on O2 at home. Patient states that one week ago he was put on "some kind of medication" for shortness of breath.    Daughter now at the bedside. Reports patient appears to be generally more weak and does not get up as easily as he normally does. No fevers. Was seen at the [facility] yesterday.  No recent fevers, cough. Was seen 2 weeks ago and placed on DuoNeb instead of inhaler for COPD.  Past Medical History  Diagnosis Date  . Colon cancer     Status post partial colectomy  . Hypertension   . Hypercholesterolemia   . Blood transfusion   . Coronary artery disease 2011    Non obstructive CAD  . DVT (deep venous thrombosis)   . PE (pulmonary embolism)   . Diabetes mellitus without complication   . A-fib   . Diabetes mellitus, type II   . Ventral hernia   . Right bundle branch block   . Unspecified hypothyroidism   . LVH (left ventricular hypertrophy) 10/10/2012    Ejection fraction 60-65%.  . Bilateral renal cysts   . Calculus of left kidney     non-obstructing  . COPD (chronic obstructive pulmonary disease)   . CHF (congestive heart failure)    Past Surgical History  Procedure Laterality Date  . Abdominal surgery      x 3  . Cardiac catheterization  2008   History reviewed. No pertinent family  history. History  Substance Use Topics  . Smoking status: Former Smoker -- 0.50 packs/day    Types: Cigarettes    Start date: 11/15/1943    Quit date: 02/20/1973  . Smokeless tobacco: Former Systems developer    Types: Chew     Comment: quit over 40 years ago  . Alcohol Use: No     Comment: quit over forty years ago    Review of Systems  Constitutional: Negative.  Negative for fever.  Respiratory: Positive for shortness of breath. Negative for chest tightness.   Cardiovascular: Negative.  Negative for chest pain and leg swelling.  Gastrointestinal: Negative.  Negative for abdominal pain.  Genitourinary: Negative.  Negative for dysuria.  Musculoskeletal: Negative for back pain.  Neurological: Negative for headaches.  All other systems reviewed and are negative.     Allergies  Codeine  Home Medications   Prior to Admission medications   Medication Sig Start Date End Date Taking? Authorizing Provider  albuterol (PROVENTIL HFA) 108 (90 BASE) MCG/ACT inhaler Inhale 2 puffs into the lungs every 6 (six) hours as needed for wheezing or shortness of breath.   Yes Historical Provider, MD  aspirin 81 MG chewable tablet Chew 81 mg by mouth daily.   Yes Historical Provider,  MD  atorvastatin (LIPITOR) 80 MG tablet Take 80 mg by mouth daily.   Yes Historical Provider, MD  enoxaparin (LOVENOX) 100 MG/ML injection Inject 100 mg into the skin daily.   Yes Historical Provider, MD  famotidine (PEPCID) 20 MG tablet Take 1 tablet (20 mg total) by mouth 2 (two) times daily. 12/06/12  Yes Lezlie Octave Black, NP  furosemide (LASIX) 20 MG tablet Take 1 tablet (20 mg total) by mouth daily as needed for fluid or edema. Patient taking differently: Take 20 mg by mouth daily.  09/09/13  Yes Costin Karlyne Greenspan, MD  levothyroxine (SYNTHROID, LEVOTHROID) 50 MCG tablet Take 50 mcg by mouth daily before breakfast.   Yes Historical Provider, MD  metoprolol tartrate (LOPRESSOR) 25 MG tablet Take 12.5 mg by mouth 2 (two) times daily.    Yes Historical Provider, MD  predniSONE (DELTASONE) 20 MG tablet Take 3 tablets (60 mg total) by mouth daily with breakfast. 01/10/14   Merryl Hacker, MD   BP 125/80 mmHg  Pulse 64  Temp(Src) 98.7 F (37.1 C) (Oral)  Resp 20  Ht 5\' 8"  (1.727 m)  Wt 179 lb (81.194 kg)  BMI 27.22 kg/m2  SpO2 100% Physical Exam  Constitutional: He is oriented to person, place, and time. No distress.  Chronically ill-appearing, no acute distress  HENT:  Head: Normocephalic and atraumatic.  Eyes: Pupils are equal, round, and reactive to light.  Neck: Neck supple.  Cardiovascular: Normal rate, regular rhythm and normal heart sounds.   No murmur heard. Pulmonary/Chest: Effort normal. No respiratory distress. He has no wheezes. He exhibits no tenderness.  Fair air movement  Abdominal: Soft. There is no tenderness.  Musculoskeletal: He exhibits no edema.  Neurological: He is alert and oriented to person, place, and time.  Equal grip strength bilaterally, patient ambulatory at baseline  Skin: Skin is warm and dry.  Psychiatric: He has a normal mood and affect.  Nursing note and vitals reviewed.   ED Course  Procedures (including critical care time) Labs Review Labs Reviewed  CBC WITH DIFFERENTIAL - Abnormal; Notable for the following:    WBC 3.3 (*)    RBC 4.17 (*)    MCV 101.0 (*)    Platelets 143 (*)    Neutrophils Relative % 30 (*)    Neutro Abs 1.0 (*)    Lymphocytes Relative 54 (*)    Monocytes Relative 14 (*)    All other components within normal limits  COMPREHENSIVE METABOLIC PANEL - Abnormal; Notable for the following:    Glucose, Bld 108 (*)    Creatinine, Ser 1.53 (*)    GFR calc non Af Amer 39 (*)    GFR calc Af Amer 45 (*)    All other components within normal limits  PRO B NATRIURETIC PEPTIDE - Abnormal; Notable for the following:    Pro B Natriuretic peptide (BNP) 1366.0 (*)    All other components within normal limits  TROPONIN I  URINALYSIS, ROUTINE W REFLEX  MICROSCOPIC    Imaging Review Dg Chest 2 View  01/10/2014   CLINICAL DATA:  Weakness and shortness of breath.  EXAM: CHEST  2 VIEW  COMPARISON:  11/16/2013  FINDINGS: Cardiomediastinal silhouette is within normal limits. Thoracic aortic calcification is noted. Lungs remain hyperinflated consistent with COPD. Opacity in the lateral right lung base is unchanged and may reflect chronic scarring. No new airspace consolidation, overt pulmonary edema, pleural effusion, or pneumothorax is identified. No acute osseous abnormality is identified.  IMPRESSION: Stable  appearance of the chest without evidence of acute airspace disease.   Electronically Signed   By: Logan Bores   On: 01/10/2014 11:05     EKG Interpretation   Date/Time:  Saturday January 10 2014 10:00:38 EST Ventricular Rate:  64 PR Interval:    QRS Duration: 136 QT Interval:  469 QTC Calculation: 484 R Axis:   -93 Text Interpretation:  Atrial fibrillation RBBB and LAFB Similar to prior  Confirmed by Norman Piacentini  MD, [place] ([id number]) on 01/10/2014 10:04:06 AM      MDM   Final diagnoses:  Shortness of breath  Generalized weakness   Patient presents with generalized weakness and recent shortness of breath. He states that he did not want to come today. He is nontoxic on exam. No focal weakness noted on exam.  Initial assessment without family member at the bedside. EKG shows rate-controlled atrial fibrillation which is unchanged from prior.  Lab work including troponin and BNP obtained. BNP is at 1366 which appears to be around the patient's baseline. Creatinine is mildly elevated at 1.53 the patient reports being on a recent medication for shortness of breath which could be a diuretic. Chest x-ray is stable. No signs of overt volume overload on exam. Patient is not actively wheezing and has fair air movement.  Patient maintaining 95% off oxygen in the room. He ambulated and maintained pulse ox of 97%. Upon return to room, he was noted to  drop oxygen saturation to the mid 80s for less than a minute but recovered on room air. He has history of COPD and likely stays in the low 90s at baseline. Will give patient 5 day course of prednisone in case this is early COPD causing shortness of breath. There are otherwise does not appear to be in acute medical emergency. Patient and family advised to follow-up with primary physician. They stated understanding.  After history, exam, and medical workup I feel the patient has been appropriately medically screened and is safe for discharge home. Pertinent diagnoses were discussed with the patient. Patient was given return precautions.   I personally performed the services described in this documentation, which was scribed in my presence. The recorded information has been reviewed and is accurate.     Merryl Hacker, MD 01/10/14 1329

## 2014-01-10 NOTE — ED Notes (Addendum)
Pt ambulated well. Oxygen saturation was maintained until pt got back to his room. Oxygen saturation was 84% with a good pleth. Pts oxygen increased to 97% within 1 minute of sitting down at bedside.

## 2014-01-10 NOTE — ED Notes (Signed)
Per EMS, pt reports generalized weakness for last several days. CBG en route 108. Pt alert and oriented. nad noted. Airway patent.

## 2014-02-13 ENCOUNTER — Encounter (HOSPITAL_COMMUNITY): Payer: Self-pay | Admitting: Emergency Medicine

## 2014-02-13 ENCOUNTER — Emergency Department (HOSPITAL_COMMUNITY): Payer: Non-veteran care

## 2014-02-13 ENCOUNTER — Inpatient Hospital Stay (HOSPITAL_COMMUNITY)
Admission: EM | Admit: 2014-02-13 | Discharge: 2014-02-17 | DRG: 176 | Disposition: A | Discharge status: Home / Self Care | Payer: Non-veteran care | Attending: Internal Medicine | Admitting: Internal Medicine

## 2014-02-13 DIAGNOSIS — N183 Chronic kidney disease, stage 3 unspecified: Secondary | ICD-10-CM | POA: Diagnosis present

## 2014-02-13 DIAGNOSIS — E039 Hypothyroidism, unspecified: Secondary | ICD-10-CM | POA: Diagnosis present

## 2014-02-13 DIAGNOSIS — Z7901 Long term (current) use of anticoagulants: Secondary | ICD-10-CM

## 2014-02-13 DIAGNOSIS — I82403 Acute embolism and thrombosis of unspecified deep veins of lower extremity, bilateral: Secondary | ICD-10-CM

## 2014-02-13 DIAGNOSIS — I472 Ventricular tachycardia: Secondary | ICD-10-CM | POA: Diagnosis not present

## 2014-02-13 DIAGNOSIS — F0390 Unspecified dementia without behavioral disturbance: Secondary | ICD-10-CM | POA: Diagnosis present

## 2014-02-13 DIAGNOSIS — D7589 Other specified diseases of blood and blood-forming organs: Secondary | ICD-10-CM | POA: Diagnosis present

## 2014-02-13 DIAGNOSIS — I951 Orthostatic hypotension: Secondary | ICD-10-CM | POA: Diagnosis present

## 2014-02-13 DIAGNOSIS — I2510 Atherosclerotic heart disease of native coronary artery without angina pectoris: Secondary | ICD-10-CM | POA: Diagnosis present

## 2014-02-13 DIAGNOSIS — I5042 Chronic combined systolic (congestive) and diastolic (congestive) heart failure: Secondary | ICD-10-CM | POA: Diagnosis not present

## 2014-02-13 DIAGNOSIS — D709 Neutropenia, unspecified: Secondary | ICD-10-CM | POA: Diagnosis not present

## 2014-02-13 DIAGNOSIS — Z880 Allergy status to penicillin: Secondary | ICD-10-CM | POA: Diagnosis not present

## 2014-02-13 DIAGNOSIS — Z87442 Personal history of urinary calculi: Secondary | ICD-10-CM

## 2014-02-13 DIAGNOSIS — Z886 Allergy status to analgesic agent status: Secondary | ICD-10-CM | POA: Diagnosis not present

## 2014-02-13 DIAGNOSIS — Z85038 Personal history of other malignant neoplasm of large intestine: Secondary | ICD-10-CM

## 2014-02-13 DIAGNOSIS — I482 Chronic atrial fibrillation, unspecified: Secondary | ICD-10-CM | POA: Diagnosis present

## 2014-02-13 DIAGNOSIS — I272 Other secondary pulmonary hypertension: Secondary | ICD-10-CM | POA: Diagnosis present

## 2014-02-13 DIAGNOSIS — Z86718 Personal history of other venous thrombosis and embolism: Secondary | ICD-10-CM | POA: Diagnosis not present

## 2014-02-13 DIAGNOSIS — Z86711 Personal history of pulmonary embolism: Secondary | ICD-10-CM

## 2014-02-13 DIAGNOSIS — I129 Hypertensive chronic kidney disease with stage 1 through stage 4 chronic kidney disease, or unspecified chronic kidney disease: Secondary | ICD-10-CM | POA: Diagnosis present

## 2014-02-13 DIAGNOSIS — I2699 Other pulmonary embolism without acute cor pulmonale: Secondary | ICD-10-CM | POA: Diagnosis present

## 2014-02-13 DIAGNOSIS — E7800 Pure hypercholesterolemia, unspecified: Secondary | ICD-10-CM | POA: Diagnosis present

## 2014-02-13 DIAGNOSIS — Z9049 Acquired absence of other specified parts of digestive tract: Secondary | ICD-10-CM | POA: Diagnosis not present

## 2014-02-13 DIAGNOSIS — Z7952 Long term (current) use of systemic steroids: Secondary | ICD-10-CM | POA: Diagnosis not present

## 2014-02-13 DIAGNOSIS — F17220 Nicotine dependence, chewing tobacco, uncomplicated: Secondary | ICD-10-CM | POA: Diagnosis present

## 2014-02-13 DIAGNOSIS — Z8546 Personal history of malignant neoplasm of prostate: Secondary | ICD-10-CM | POA: Diagnosis not present

## 2014-02-13 DIAGNOSIS — J449 Chronic obstructive pulmonary disease, unspecified: Secondary | ICD-10-CM | POA: Diagnosis not present

## 2014-02-13 DIAGNOSIS — R001 Bradycardia, unspecified: Secondary | ICD-10-CM | POA: Diagnosis present

## 2014-02-13 DIAGNOSIS — D696 Thrombocytopenia, unspecified: Secondary | ICD-10-CM | POA: Diagnosis present

## 2014-02-13 DIAGNOSIS — Z7982 Long term (current) use of aspirin: Secondary | ICD-10-CM | POA: Diagnosis not present

## 2014-02-13 DIAGNOSIS — I4729 Other ventricular tachycardia: Secondary | ICD-10-CM

## 2014-02-13 DIAGNOSIS — Z885 Allergy status to narcotic agent status: Secondary | ICD-10-CM

## 2014-02-13 DIAGNOSIS — R55 Syncope and collapse: Secondary | ICD-10-CM | POA: Diagnosis not present

## 2014-02-13 DIAGNOSIS — I5043 Acute on chronic combined systolic (congestive) and diastolic (congestive) heart failure: Secondary | ICD-10-CM | POA: Diagnosis present

## 2014-02-13 DIAGNOSIS — M549 Dorsalgia, unspecified: Secondary | ICD-10-CM | POA: Diagnosis not present

## 2014-02-13 DIAGNOSIS — E119 Type 2 diabetes mellitus without complications: Secondary | ICD-10-CM | POA: Diagnosis present

## 2014-02-13 DIAGNOSIS — R918 Other nonspecific abnormal finding of lung field: Secondary | ICD-10-CM | POA: Diagnosis present

## 2014-02-13 DIAGNOSIS — I4891 Unspecified atrial fibrillation: Secondary | ICD-10-CM | POA: Diagnosis not present

## 2014-02-13 DIAGNOSIS — I452 Bifascicular block: Secondary | ICD-10-CM | POA: Diagnosis not present

## 2014-02-13 DIAGNOSIS — R079 Chest pain, unspecified: Secondary | ICD-10-CM | POA: Diagnosis present

## 2014-02-13 DIAGNOSIS — R52 Pain, unspecified: Secondary | ICD-10-CM

## 2014-02-13 HISTORY — DX: Chronic kidney disease, stage 3 (moderate): N18.3

## 2014-02-13 HISTORY — DX: Chronic combined systolic (congestive) and diastolic (congestive) heart failure: I50.42

## 2014-02-13 HISTORY — DX: Rheumatic tricuspid insufficiency: I07.1

## 2014-02-13 HISTORY — DX: Pulmonary hypertension, unspecified: I27.20

## 2014-02-13 HISTORY — DX: Chronic atrial fibrillation, unspecified: I48.20

## 2014-02-13 HISTORY — DX: Chronic kidney disease, stage 3 unspecified: N18.30

## 2014-02-13 HISTORY — DX: Type 2 diabetes mellitus without complications: E11.9

## 2014-02-13 HISTORY — DX: Other nonspecific abnormal finding of lung field: R91.8

## 2014-02-13 HISTORY — DX: Unspecified dementia, unspecified severity, without behavioral disturbance, psychotic disturbance, mood disturbance, and anxiety: F03.90

## 2014-02-13 HISTORY — DX: Nonrheumatic mitral (valve) insufficiency: I34.0

## 2014-02-13 HISTORY — DX: Bradycardia, unspecified: R00.1

## 2014-02-13 HISTORY — DX: Orthostatic hypotension: I95.1

## 2014-02-13 HISTORY — DX: Atherosclerotic heart disease of native coronary artery without angina pectoris: I25.10

## 2014-02-13 HISTORY — DX: Bifascicular block: I45.2

## 2014-02-13 HISTORY — DX: Syncope and collapse: R55

## 2014-02-13 LAB — COMPREHENSIVE METABOLIC PANEL
ALK PHOS: 82 U/L (ref 39–117)
ALT: 15 U/L (ref 0–53)
AST: 22 U/L (ref 0–37)
Albumin: 4.1 g/dL (ref 3.5–5.2)
Anion gap: 10 (ref 5–15)
BUN: 24 mg/dL — ABNORMAL HIGH (ref 6–23)
CO2: 26 mmol/L (ref 19–32)
Calcium: 9.3 mg/dL (ref 8.4–10.5)
Chloride: 103 mEq/L (ref 96–112)
Creatinine, Ser: 1.29 mg/dL (ref 0.50–1.35)
GFR, EST AFRICAN AMERICAN: 55 mL/min — AB (ref >90)
GFR, EST NON AFRICAN AMERICAN: 47 mL/min — AB (ref >90)
Glucose, Bld: 130 mg/dL — ABNORMAL HIGH (ref 70–99)
Potassium: 3.7 mmol/L (ref 3.5–5.1)
Sodium: 139 mmol/L (ref 135–145)
TOTAL PROTEIN: 7.3 g/dL (ref 6.0–8.3)
Total Bilirubin: 0.3 mg/dL (ref 0.3–1.2)

## 2014-02-13 LAB — CBC WITH DIFFERENTIAL/PLATELET
Basophils Absolute: 0 10*3/uL (ref 0.0–0.1)
Basophils Relative: 0 % (ref 0–1)
EOS ABS: 0 10*3/uL (ref 0.0–0.7)
EOS PCT: 1 % (ref 0–5)
HEMATOCRIT: 39.7 % (ref 39.0–52.0)
HEMOGLOBIN: 13 g/dL (ref 13.0–17.0)
LYMPHS ABS: 2.4 10*3/uL (ref 0.7–4.0)
Lymphocytes Relative: 66 % — ABNORMAL HIGH (ref 12–46)
MCH: 33.1 pg (ref 26.0–34.0)
MCHC: 32.7 g/dL (ref 30.0–36.0)
MCV: 101 fL — AB (ref 78.0–100.0)
Monocytes Absolute: 0.3 10*3/uL (ref 0.1–1.0)
Monocytes Relative: 8 % (ref 3–12)
Neutro Abs: 0.9 10*3/uL — ABNORMAL LOW (ref 1.7–7.7)
Neutrophils Relative %: 24 % — ABNORMAL LOW (ref 43–77)
Platelets: 151 10*3/uL (ref 150–400)
RBC: 3.93 MIL/uL — AB (ref 4.22–5.81)
RDW: 14 % (ref 11.5–15.5)
WBC: 3.7 10*3/uL — ABNORMAL LOW (ref 4.0–10.5)

## 2014-02-13 LAB — BRAIN NATRIURETIC PEPTIDE: B Natriuretic Peptide: 144 pg/mL — ABNORMAL HIGH (ref 0.0–100.0)

## 2014-02-13 LAB — TROPONIN I: Troponin I: 0.12 ng/mL — ABNORMAL HIGH (ref <0.031)

## 2014-02-13 MED ORDER — DIPHENHYDRAMINE HCL 50 MG/ML IJ SOLN
12.5000 mg | Freq: Once | INTRAMUSCULAR | Status: AC
  Administered 2014-02-13: 12.5 mg via INTRAVENOUS

## 2014-02-13 MED ORDER — NITROGLYCERIN 2 % TD OINT
1.0000 [in_us] | TOPICAL_OINTMENT | Freq: Once | TRANSDERMAL | Status: AC
  Administered 2014-02-13: 1 [in_us] via TOPICAL
  Filled 2014-02-13: qty 1

## 2014-02-13 MED ORDER — HEPARIN (PORCINE) IN NACL 100-0.45 UNIT/ML-% IJ SOLN
1200.0000 [IU]/h | INTRAMUSCULAR | Status: DC
  Administered 2014-02-13: 1200 [IU]/h via INTRAVENOUS
  Filled 2014-02-13 (×2): qty 250

## 2014-02-13 MED ORDER — MORPHINE SULFATE 2 MG/ML IJ SOLN
2.0000 mg | Freq: Once | INTRAMUSCULAR | Status: AC
  Administered 2014-02-13: 2 mg via INTRAVENOUS
  Filled 2014-02-13: qty 1

## 2014-02-13 MED ORDER — DIPHENHYDRAMINE HCL 50 MG/ML IJ SOLN
INTRAMUSCULAR | Status: AC
  Filled 2014-02-13: qty 1

## 2014-02-13 MED ORDER — IOHEXOL 350 MG/ML SOLN
100.0000 mL | Freq: Once | INTRAVENOUS | Status: AC | PRN
  Administered 2014-02-13: 100 mL via INTRAVENOUS

## 2014-02-13 MED ORDER — SODIUM CHLORIDE 0.9 % IV SOLN
Freq: Once | INTRAVENOUS | Status: AC
  Administered 2014-02-13: 125 mL/h via INTRAVENOUS

## 2014-02-13 NOTE — H&P (Signed)
[facility] History and Physical  Bryce Mcdonald RCB:[id number] DOB: 24-Sep-1921 DOA: 02/13/2014  Referring physician: ER physician. PCP: PROVIDER NOT IN SYSTEM  Chief Complaint: Upper back pain and loss of consciousness.  HPI: Bryce Mcdonald is a 80 y.o. male with history of PE, chronic atrial fibrillation, chronic systolic heart failure last EF measured was 40-50%, history of colon cancer and prostate cancer was brought to the ER after patient had syncopal episode and also had complained of upper back pain. Patient is a poor historian. Patient states that he has been having upper back pain over the last 2 days and last night he had a brief episode of loss of consciousness and also had another episode this evening. In the ER patient's troponin level was mildly elevated and EKG was showing nothing acute. CT angiogram of the chest was done which shows new nonocclusive PE. Since troponin was elevated on call cardiologist was consulted. At this time patient will be transferred to [facility] for further management given patient's elevated troponin. Patient still has upper back pain. Patient describes the pain as constant dull aching no associated diaphoresis nausea vomiting abdominal pain or shortness of breath. Patient states that he has been taking his medications except that 4 weeks ago he has missed some shots of Lovenox. Patient used to be on 02 and has been changed back to Lovenox and I did discuss with patient's son and daughter were not aware exactly why this change was made.   Review of Systems: As presented in the history of presenting illness, rest negative.  Past Medical History  Diagnosis Date  . Colon cancer     Status post partial colectomy  . Hypertension   . Hypercholesterolemia   . Blood transfusion   . Coronary artery disease 2011    Non obstructive CAD  . DVT (deep venous thrombosis)   . PE (pulmonary embolism)   . Diabetes mellitus without complication   . A-fib    . Diabetes mellitus, type II   . Ventral hernia   . Right bundle branch block   . Unspecified hypothyroidism   . LVH (left ventricular hypertrophy) 10/10/2012    Ejection fraction 60-65%.  . Bilateral renal cysts   . Calculus of left kidney     non-obstructing  . COPD (chronic obstructive pulmonary disease)   . CHF (congestive heart failure)    Past Surgical History  Procedure Laterality Date  . Abdominal surgery      x 3  . Cardiac catheterization  2008   Social History:  reports that he quit smoking about 41 years ago. His smoking use included Cigarettes. He started smoking about 70 years ago. He smoked 0.50 packs per day. He has quit using smokeless tobacco. His smokeless tobacco use included Chew. He reports that he does not drink alcohol or use illicit drugs. Where does patient live home. Can patient participate in ADLs? Yes.  Allergies  Allergen Reactions  . Codeine Shortness Of Breath    Shortness of breath  . Penicillins Shortness Of Breath  . Morphine And Related Itching    Family History: History reviewed. No pertinent family history.    Prior to Admission medications   Medication Sig Start Date End Date Taking? Authorizing Provider  acetaminophen (TYLENOL) 325 MG tablet Take 650 mg by mouth every 6 (six) hours as needed for mild pain or fever.   Yes Historical Provider, MD  albuterol (PROVENTIL HFA) 108 (90 BASE) MCG/ACT inhaler Inhale 2 puffs into the lungs every  6 (six) hours as needed for wheezing or shortness of breath.   Yes Historical Provider, MD  aspirin 81 MG chewable tablet Chew 81 mg by mouth daily.   Yes Historical Provider, MD  atorvastatin (LIPITOR) 80 MG tablet Take 80 mg by mouth daily.   Yes Historical Provider, MD  enoxaparin (LOVENOX) 100 MG/ML injection Inject 100 mg into the skin daily.   Yes Historical Provider, MD  famotidine (PEPCID) 20 MG tablet Take 1 tablet (20 mg total) by mouth 2 (two) times daily. 12/06/12  Yes Lezlie Octave Black, NP   furosemide (LASIX) 20 MG tablet Take 1 tablet (20 mg total) by mouth daily as needed for fluid or edema. Patient taking differently: Take 20 mg by mouth daily.  09/09/13  Yes Costin Karlyne Greenspan, MD  levothyroxine (SYNTHROID, LEVOTHROID) 50 MCG tablet Take 50 mcg by mouth daily before breakfast.   Yes Historical Provider, MD  metoprolol tartrate (LOPRESSOR) 25 MG tablet Take 12.5 mg by mouth 2 (two) times daily.   Yes Historical Provider, MD  vitamin B-12 (CYANOCOBALAMIN) 1000 MCG tablet Take 1,000 mcg by mouth daily.   Yes Historical Provider, MD  predniSONE (DELTASONE) 20 MG tablet Take 3 tablets (60 mg total) by mouth daily with breakfast. Patient not taking: Reported on 02/13/2014 01/10/14   Merryl Hacker, MD    Physical Exam: Filed Vitals:   02/13/14 2100 02/13/14 2112 02/13/14 2130 02/13/14 2321  BP: 124/53 117/59 132/87 129/74  Pulse: 61 53 60 70  Temp:      TempSrc:      Resp: 20 20  20   Height:      Weight:      SpO2: 97% 95% 96% 97%     General:  Well-developed and nourished.  Eyes: Anicteric no pallor.  ENT: No discharge from the ears eyes nose or mouth.  Neck: No mass felt.  Cardiovascular: S1 and S2 heard.  Respiratory: No rhonchi or crepitations.  Abdomen: Soft nontender bowel sounds present. No guarding or rigidity.  Skin: No rash.  Musculoskeletal: No edema.  Psychiatric: Appears normal.  Neurologic: Alert awake oriented to time place and person. Moves all extremities.  Labs on Admission:  Basic Metabolic Panel:  Recent Labs Lab 02/13/14 1937  NA 139  K 3.7  CL 103  CO2 26  GLUCOSE 130*  BUN 24*  CREATININE 1.29  CALCIUM 9.3   Liver Function Tests:  Recent Labs Lab 02/13/14 1937  AST 22  ALT 15  ALKPHOS 82  BILITOT 0.3  PROT 7.3  ALBUMIN 4.1   No results for input(s): LIPASE, AMYLASE in the last 168 hours. No results for input(s): AMMONIA in the last 168 hours. CBC:  Recent Labs Lab 02/13/14 1937  WBC 3.7*  NEUTROABS  0.9*  HGB 13.0  HCT 39.7  MCV 101.0*  PLT 151   Cardiac Enzymes:  Recent Labs Lab 02/13/14 1937  TROPONINI 0.12*    BNP (last 3 results)  Recent Labs  07/26/13 1128 11/16/13 0839 01/10/14 1046  PROBNP 2036.0* 770.7* 1366.0*   CBG: No results for input(s): GLUCAP in the last 168 hours.  Radiological Exams on Admission: Dg Chest 1 View  02/13/2014   CLINICAL DATA:  Right facial paresthesias, right neck pain, coronary disease, atrial fibrillation  EXAM: CHEST - 1 VIEW  COMPARISON:  01/10/2014  FINDINGS: Background COPD/ emphysema with parenchymal scarring in the right lower lobe as before. Normal heart size and vascularity. Atherosclerosis of the aorta noted. No superimposed edema, pneumonia,  collapse or consolidation. No enlarging effusion or pneumothorax. Trachea midline. Atherosclerosis noted of the aorta. Stable exam.  IMPRESSION: Stable COPD/emphysema with right lower lobe parenchymal scarring.   Electronically Signed   By: Daryll Brod M.D.   On: 02/13/2014 19:22   Ct Head Wo Contrast  02/13/2014   CLINICAL DATA:  Acute syncopal episode  EXAM: CT HEAD WITHOUT CONTRAST  TECHNIQUE: Contiguous axial images were obtained from the base of the skull through the vertex without contrast.  COMPARISON:  11/16/2013  FINDINGS: Limited with some motion artifact. Stable brain atrophy pattern with chronic white matter microvascular ischemic changes. No acute intracranial hemorrhage, mass lesion, new infarction, midline shift, herniation, hydrocephalus, or extra-axial fluid collection. No focal mass effect or edema. Patent cisterns. Cerebellar atrophy as well. Orbits are symmetric. Mastoids and sinuses are clear. Atherosclerosis of the intracranial vessels at the skull base. Skull appears intact.  IMPRESSION: Stable atrophy and chronic white matter ischemic changes. No interval change or acute process by noncontrast CT.   Electronically Signed   By: Daryll Brod M.D.   On: 02/13/2014 19:12    Ct Angio Chest Aorta W/cm &/or Wo/cm  02/13/2014   CLINICAL DATA:  Cough, weakness, shortness of breath, altered mental status. Symptoms began this morning, syncope. RIGHT facial numbness for 2 days. Hallucinations.  EXAM: CT ANGIOGRAPHY CHEST WITH CONTRAST  TECHNIQUE: Multidetector CT imaging of the chest was performed using the standard protocol during bolus administration of intravenous contrast. Multiplanar CT image reconstructions and MIPs were obtained to evaluate the vascular anatomy.  CONTRAST:  136mL OMNIPAQUE IOHEXOL 350 MG/ML SOLN  COMPARISON:  Chest radiograph February 13, 2014 at 1857 hr ; CT angiogram of the chest October 10, 2012.  FINDINGS: Adequate pulmonary arterial contrast opacification. Main pulmonary artery is not enlarged. Nonocclusive RIGHT lower lobe segmental pulmonary embolus, axial 182/291. A tiny vein courses from the LEFT subclavian vein towards the inferior aspect of the heart, suggesting duplicated superior vena cava, congenital variant.  The heart size is upper limits of normal. RIGHT heart strain (RV/LV is 1). Small pericardial effusion. Thoracic aorta is normal in course and caliber with moderate calcific atherosclerosis. No lymphadenopathy by CT size criteria. Subcentimeter pretracheal, subcarinal lymph nodes.  Lung base atelectasis/scarring, unchanged. Stable scattered pulmonary nodules, measuring up to 6 mm in RIGHT middle lobe.  Included view of the abdomen is not acute; splenic granulomas. Osseous structures are nonsuspicious. Similar degenerative change of the thoracic spine.  Review of the MIP images confirms the above findings.  IMPRESSION: Nonocclusive RIGHT lower lobe segmental pulmonary embolus appears acute. CT evidence of right heart strain appears unchanged from prior imaging. (RV/LV Ratio = Positive for acute PE with CTevidence of right heart strain (RV/LV Ratio = 1) consistent with at least submassive (intermediate risk) PE. The presence of right heart strain  has been associated with an increased risk of morbidity and mortality.) consistent with at least submassive (intermediate risk) PE. The presence of right heart strain has been associated with an increased risk of morbidity and mortality.  Stable pulmonary nodules.  Acute findings discussed with and reconfirmed by Dr.Pickerington on 02/13/2014 at 10:26 pm.   Electronically Signed   By: Elon Alas   On: 02/13/2014 22:28    EKG: Independently reviewed. Atrial fibrillation with RBBB.  Assessment/Plan Active Problems:   Syncope   Chest pain   Pulmonary embolism   Chronic atrial fibrillation   Congestive heart failure with left ventricular systolic dysfunction   1. Acute pulmonary embolism, hemodynamically  stable - patient has been on Lovenox despite which patient has had a PE. At this time did not show if patient was compliant with his medication since patient states that he did miss his Lovenox shots 4 weeks ago. Patient has been started on heparin infusion at this time and may need to discuss with hematologist for further recommendations given that patient has had a PE despite being on Lovenox. Cycle cardiac markers and check 2-D echo. 2. Syncope - patient has had previous history of syncope and also has chronic atrial fibrillation. Syncope could be secondary to #1 but we need to rule out arrhythmias like tachybradycardia syndrome. Cardiology to see in consult. Patient is on beta blockers which may need to be discontinued if there is any bradycardic episodes. Closely monitor. 3. Elevated troponin - at this time we will cycle cardiac markers check 2-D echo and aspirin and patient is on heparin. Cardiology to see patient in consult. 4. Chronic systolic heart failure last year which shows 40-50% - patient appears compensated and takes Lasix as needed. Presently we will hold off Lasix and closely observe. 5. History of colon cancer and prostate cancer.   Patient is being transferred to [facility] for further cardiology assessment. Dr. Posey Pronto will be the accepting physician. Patient and patient's family is agreeable to the transfer.  DVT Prophylaxis on full dose heparin for PE.  Code Status: Full code.  Family Communication: Patient's son and daughter.  Disposition Plan: Admit to inpatient.    Narelle Schoening N. [facility] Pager [phone].  If 7PM-7AM, please contact night-coverage [email or web address] Password [facility] 02/13/2014, 11:55 PM

## 2014-02-13 NOTE — ED Notes (Signed)
Pt. Hallucinating. Pt. Reports seeing spiders in the floor. Dr. Roderic Palau notified.

## 2014-02-13 NOTE — ED Notes (Signed)
Daughter Andee Poles can be contacted at [phone]

## 2014-02-13 NOTE — ED Notes (Signed)
Pt states the last two days, he has had episode of facial numbness on right side

## 2014-02-13 NOTE — ED Notes (Addendum)
Patient brought in by EMS with complaint of diarrhea since this morning. Also states he sat up in bed and got dizzy and passed out. Patient also complains of headache at triage.

## 2014-02-13 NOTE — Progress Notes (Signed)
ANTICOAGULATION CONSULT NOTE - Initial Consult  Pharmacy Consult for Heparin Indication: pulmonary embolus  Allergies  Allergen Reactions  . Codeine Shortness Of Breath    Shortness of breath  . Penicillins Shortness Of Breath  . Morphine And Related Itching    Patient Measurements: Height: 5\' 8"  (172.7 cm) Weight: 175 lb (79.379 kg) IBW/kg (Calculated) : 68.4 Heparin Dosing Weight: 79 kg  Vital Signs: Temp: 97.8 F (36.6 C) (12/25 1801) Temp Source: Oral (12/25 1801) BP: 132/87 mmHg (12/25 2130) Pulse Rate: 60 (12/25 2130)  Labs:  Recent Labs  02/13/14 1937  HGB 13.0  HCT 39.7  PLT 151  CREATININE 1.29  TROPONINI 0.12*    Estimated Creatinine Clearance: 36.8 mL/min (by C-G formula based on Cr of 1.29).   Medical History: Past Medical History  Diagnosis Date  . Colon cancer     Status post partial colectomy  . Hypertension   . Hypercholesterolemia   . Blood transfusion   . Coronary artery disease 2011    Non obstructive CAD  . DVT (deep venous thrombosis)   . PE (pulmonary embolism)   . Diabetes mellitus without complication   . A-fib   . Diabetes mellitus, type II   . Ventral hernia   . Right bundle branch block   . Unspecified hypothyroidism   . LVH (left ventricular hypertrophy) 10/10/2012    Ejection fraction 60-65%.  . Bilateral renal cysts   . Calculus of left kidney     non-obstructing  . COPD (chronic obstructive pulmonary disease)   . CHF (congestive heart failure)     Medications:  Scheduled:   Assessment: 80 yo M with new RLL PE and right heart strain.   He has been taking Lovenox 100mg  sq q24h at home- last dose at 0700 today. CBC reviewed.  No bleeding noted.  Plan transfer to [facility].    No bleeding noted.  Goal of Therapy:  Heparin level 0.3-0.7 units/ml Monitor platelets by anticoagulation protocol: Yes   Plan:  Start heparin infusion at 1200 units/hr Check anti-Xa level in 8 hours and daily while on heparin Continue to  monitor H&H and platelets  Biagio Borg 02/13/2014,11:04 PM

## 2014-02-13 NOTE — ED Provider Notes (Signed)
  Physical Exam  BP 132/87 mmHg  Pulse 60  Temp(Src) 97.8 F (36.6 C) (Oral)  Resp 20  Ht 5\' 8"  (1.727 m)  Wt 175 lb (79.379 kg)  BMI 26.61 kg/m2  SpO2 96%  Physical Exam  ED Course  Procedures  MDM CT scan showed pulmonary embolism. Discuss with Dr Ulyses Amor, at [facility]. [facility] not admit to cardiology, but may still require medicine admission, but not to cardiology service. Started on heparin. Discussed with Dr. Hal Hope and still will transfer to [facility].      Jasper Riling. Alvino Chapel, MD 02/13/14 [phone]

## 2014-02-13 NOTE — ED Notes (Signed)
Pt. Itching. Dr. Roderic Palau notified. Verbal order for Benadryl given. Morphine added to allergy list.

## 2014-02-13 NOTE — ED Provider Notes (Signed)
CSN: [id number]     Arrival date & time 02/13/14  1758 History  This chart was scribed for Bryce Diego, MD by Edison Simon, ED Scribe. This patient was seen in room APA09/APA09 and the patient's care was started at 6:25 PM.    Chief Complaint  Patient presents with  . Diarrhea  . Loss of Consciousness   Patient is a 80 y.o. male presenting with syncope. The history is provided by the patient and a relative. No language interpreter was used.  Loss of Consciousness Episode history:  Single Most recent episode:  Today Timing: once. Progression:  Resolved Chronicity:  New Context: normal activity   Witnessed: no   Relieved by:  None tried Worsened by:  Nothing tried Ineffective treatments:  None tried Associated symptoms: no chest pain, no headaches and no seizures     HPI Comments: Bryce Mcdonald is a 80 y.o. male who presents to the Emergency Department complaining of neck pain and syncope with onset today. He lives in an apartment on his own, but a relative lives in the same apartment complex. Per daughter, he called her at 0100 this morning and complained of pain going down his side. He reports that later on around 1600, he passed out. He states he was going to lay down in bed and go to sleep when he felt a pain in his neck then fell down and states everything turned the same color; after waking, he called his daughter and told her her thought he was having a heart attack. He denies chest pain at this time. He states he had some diarrhea this morning.  PCP: Dr. Berline Lopes at [facility]   Past Medical History  Diagnosis Date  . Colon cancer     Status post partial colectomy  . Hypertension   . Hypercholesterolemia   . Blood transfusion   . Coronary artery disease 2011    Non obstructive CAD  . DVT (deep venous thrombosis)   . PE (pulmonary embolism)   . Diabetes mellitus without complication   . A-fib   . Diabetes mellitus, type II   . Ventral hernia   . Right bundle branch block    . Unspecified hypothyroidism   . LVH (left ventricular hypertrophy) 10/10/2012    Ejection fraction 60-65%.  . Bilateral renal cysts   . Calculus of left kidney     non-obstructing  . COPD (chronic obstructive pulmonary disease)   . CHF (congestive heart failure)    Past Surgical History  Procedure Laterality Date  . Abdominal surgery      x 3  . Cardiac catheterization  2008   History reviewed. No pertinent family history. History  Substance Use Topics  . Smoking status: Former Smoker -- 0.50 packs/day    Types: Cigarettes    Start date: 11/15/1943    Quit date: 02/20/1973  . Smokeless tobacco: Former Systems developer    Types: Chew     Comment: quit over 40 years ago  . Alcohol Use: No     Comment: quit over forty years ago    Review of Systems  Constitutional: Negative for appetite change and fatigue.  HENT: Negative for congestion, ear discharge and sinus pressure.   Eyes: Negative for discharge.  Respiratory: Negative for cough.   Cardiovascular: Positive for syncope. Negative for chest pain.  Gastrointestinal: Positive for diarrhea. Negative for abdominal pain.  Genitourinary: Negative for frequency and hematuria.  Musculoskeletal: Positive for neck pain. Negative for back pain.  Skin: Negative for  rash.  Neurological: Positive for syncope. Negative for seizures and headaches.  Psychiatric/Behavioral: Negative for hallucinations.      Allergies  Codeine and Penicillins  Home Medications   Prior to Admission medications   Medication Sig Start Date End Date Taking? Authorizing Provider  albuterol (PROVENTIL HFA) 108 (90 BASE) MCG/ACT inhaler Inhale 2 puffs into the lungs every 6 (six) hours as needed for wheezing or shortness of breath.    Historical Provider, MD  aspirin 81 MG chewable tablet Chew 81 mg by mouth daily.    Historical Provider, MD  atorvastatin (LIPITOR) 80 MG tablet Take 80 mg by mouth daily.    Historical Provider, MD  enoxaparin (LOVENOX) 100 MG/ML  injection Inject 100 mg into the skin daily.    Historical Provider, MD  famotidine (PEPCID) 20 MG tablet Take 1 tablet (20 mg total) by mouth 2 (two) times daily. 12/06/12   Radene Gunning, NP  furosemide (LASIX) 20 MG tablet Take 1 tablet (20 mg total) by mouth daily as needed for fluid or edema. Patient taking differently: Take 20 mg by mouth daily.  09/09/13   Costin Karlyne Greenspan, MD  levothyroxine (SYNTHROID, LEVOTHROID) 50 MCG tablet Take 50 mcg by mouth daily before breakfast.    Historical Provider, MD  metoprolol tartrate (LOPRESSOR) 25 MG tablet Take 12.5 mg by mouth 2 (two) times daily.    Historical Provider, MD  predniSONE (DELTASONE) 20 MG tablet Take 3 tablets (60 mg total) by mouth daily with breakfast. 01/10/14   Merryl Hacker, MD   BP 154/79 mmHg  Pulse 79  Temp(Src) 97.8 F (36.6 C) (Oral)  Resp 24  Ht 5\' 8"  (1.727 m)  Wt 175 lb (79.379 kg)  BMI 26.61 kg/m2  SpO2 95% Physical Exam  Constitutional: He is oriented to person, place, and time. He appears well-developed.  HENT:  Head: Normocephalic.  Eyes: Conjunctivae and EOM are normal. No scleral icterus.  Neck: Neck supple. No thyromegaly present.  Cardiovascular: Normal rate and regular rhythm.  Exam reveals no gallop and no friction rub.   No murmur heard. Pulmonary/Chest: No stridor. He has no wheezes. He has no rales. He exhibits no tenderness.  Abdominal: He exhibits no distension. There is no tenderness. There is no rebound.  Musculoskeletal: Normal range of motion. He exhibits no edema.  Lymphadenopathy:    He has no cervical adenopathy.  Neurological: He is oriented to person, place, and time. He exhibits normal muscle tone. Coordination normal.  Skin: No rash noted. No erythema.  Psychiatric: He has a normal mood and affect. His behavior is normal.  Nursing note and vitals reviewed.   ED Course  Procedures (including critical care time)  DIAGNOSTIC STUDIES: Oxygen Saturation is 95% on room air,  adequate by my interpretation.    COORDINATION OF CARE: 6:31 PM Discussed treatment plan with patient at beside, the patient agrees with the plan and has no further questions at this time.   Labs Review Labs Reviewed - No data to display  Imaging Review No results found.   EKG Interpretation   Date/Time:  Friday February 13 2014 18:05:30 EST Ventricular Rate:  65 PR Interval:    QRS Duration: 135 QT Interval:  469 QTC Calculation: 488 R Axis:   -94 Text Interpretation:  Atrial fibrillation RBBB and LAFB Inferior infarct,  old Confirmed by Hipolito Martinezlopez  MD, Carel Schnee [phone]) on 02/13/2014 8:21:48 PM     CRITICAL CARE Performed by: Leesha Veno L Total critical care time: 45 Critical  care time was exclusive of separately billable procedures and treating other patients. Critical care was necessary to treat or prevent imminent or life-threatening deterioration. Critical care was time spent personally by me on the following activities: development of treatment plan with patient and/or surrogate as well as nursing, discussions with consultants, evaluation of patient's response to treatment, examination of patient, obtaining history from patient or surrogate, ordering and performing treatments and interventions, ordering and review of laboratory studies, ordering and review of radiographic studies, pulse oximetry and re-evaluation of patient's condition.  MDM   Final diagnoses:  None    Pt with neck and back pain with pos. Troponin.   Sp[facility] with [facility]ology at [facility].   Will begin heparin if no aortic dissection and will admit to [facility]   Bryce Diego, MD 02/13/14 2155

## 2014-02-14 ENCOUNTER — Encounter (HOSPITAL_COMMUNITY): Payer: Self-pay | Admitting: Physician Assistant

## 2014-02-14 DIAGNOSIS — R001 Bradycardia, unspecified: Secondary | ICD-10-CM | POA: Diagnosis present

## 2014-02-14 DIAGNOSIS — I272 Pulmonary hypertension, unspecified: Secondary | ICD-10-CM | POA: Diagnosis present

## 2014-02-14 DIAGNOSIS — E7800 Pure hypercholesterolemia, unspecified: Secondary | ICD-10-CM | POA: Diagnosis present

## 2014-02-14 DIAGNOSIS — I951 Orthostatic hypotension: Secondary | ICD-10-CM | POA: Diagnosis present

## 2014-02-14 DIAGNOSIS — R918 Other nonspecific abnormal finding of lung field: Secondary | ICD-10-CM | POA: Diagnosis present

## 2014-02-14 DIAGNOSIS — I2510 Atherosclerotic heart disease of native coronary artery without angina pectoris: Secondary | ICD-10-CM | POA: Diagnosis present

## 2014-02-14 DIAGNOSIS — I5043 Acute on chronic combined systolic (congestive) and diastolic (congestive) heart failure: Secondary | ICD-10-CM | POA: Diagnosis present

## 2014-02-14 DIAGNOSIS — J441 Chronic obstructive pulmonary disease with (acute) exacerbation: Secondary | ICD-10-CM | POA: Insufficient documentation

## 2014-02-14 DIAGNOSIS — I4729 Other ventricular tachycardia: Secondary | ICD-10-CM

## 2014-02-14 DIAGNOSIS — N183 Chronic kidney disease, stage 3 unspecified: Secondary | ICD-10-CM | POA: Diagnosis present

## 2014-02-14 DIAGNOSIS — I452 Bifascicular block: Secondary | ICD-10-CM | POA: Diagnosis present

## 2014-02-14 DIAGNOSIS — C189 Malignant neoplasm of colon, unspecified: Secondary | ICD-10-CM | POA: Insufficient documentation

## 2014-02-14 DIAGNOSIS — I472 Ventricular tachycardia: Secondary | ICD-10-CM

## 2014-02-14 DIAGNOSIS — I071 Rheumatic tricuspid insufficiency: Secondary | ICD-10-CM | POA: Insufficient documentation

## 2014-02-14 DIAGNOSIS — I340 Nonrheumatic mitral (valve) insufficiency: Secondary | ICD-10-CM | POA: Insufficient documentation

## 2014-02-14 DIAGNOSIS — I10 Essential (primary) hypertension: Secondary | ICD-10-CM | POA: Insufficient documentation

## 2014-02-14 DIAGNOSIS — I270 Primary pulmonary hypertension: Secondary | ICD-10-CM

## 2014-02-14 LAB — CBC WITH DIFFERENTIAL/PLATELET
Basophils Absolute: 0 10*3/uL (ref 0.0–0.1)
Basophils Relative: 0 % (ref 0–1)
Eosinophils Absolute: 0 10*3/uL (ref 0.0–0.7)
Eosinophils Relative: 0 % (ref 0–5)
HCT: 37 % — ABNORMAL LOW (ref 39.0–52.0)
Hemoglobin: 12.2 g/dL — ABNORMAL LOW (ref 13.0–17.0)
Lymphocytes Relative: 60 % — ABNORMAL HIGH (ref 12–46)
Lymphs Abs: 2.2 10*3/uL (ref 0.7–4.0)
MCH: 33.1 pg (ref 26.0–34.0)
MCHC: 33 g/dL (ref 30.0–36.0)
MCV: 100.3 fL — ABNORMAL HIGH (ref 78.0–100.0)
Monocytes Absolute: 0.5 10*3/uL (ref 0.1–1.0)
Monocytes Relative: 13 % — ABNORMAL HIGH (ref 3–12)
Neutro Abs: 1 10*3/uL — ABNORMAL LOW (ref 1.7–7.7)
Neutrophils Relative %: 27 % — ABNORMAL LOW (ref 43–77)
Platelets: 136 10*3/uL — ABNORMAL LOW (ref 150–400)
RBC: 3.69 MIL/uL — ABNORMAL LOW (ref 4.22–5.81)
RDW: 14 % (ref 11.5–15.5)
WBC: 3.7 10*3/uL — ABNORMAL LOW (ref 4.0–10.5)

## 2014-02-14 LAB — COMPREHENSIVE METABOLIC PANEL
ALT: 12 U/L (ref 0–53)
AST: 19 U/L (ref 0–37)
Albumin: 3.7 g/dL (ref 3.5–5.2)
Alkaline Phosphatase: 76 U/L (ref 39–117)
Anion gap: 7 (ref 5–15)
BUN: 18 mg/dL (ref 6–23)
CO2: 29 mmol/L (ref 19–32)
Calcium: 9.3 mg/dL (ref 8.4–10.5)
Chloride: 104 mEq/L (ref 96–112)
Creatinine, Ser: 1.24 mg/dL (ref 0.50–1.35)
GFR calc Af Amer: 57 mL/min — ABNORMAL LOW (ref >90)
GFR calc non Af Amer: 49 mL/min — ABNORMAL LOW (ref >90)
Glucose, Bld: 102 mg/dL — ABNORMAL HIGH (ref 70–99)
Potassium: 3.8 mmol/L (ref 3.5–5.1)
Sodium: 140 mmol/L (ref 135–145)
Total Bilirubin: 0.6 mg/dL (ref 0.3–1.2)
Total Protein: 6.3 g/dL (ref 6.0–8.3)

## 2014-02-14 LAB — GLUCOSE, CAPILLARY: GLUCOSE-CAPILLARY: 115 mg/dL — AB (ref 70–99)

## 2014-02-14 LAB — TROPONIN I
Troponin I: 0.13 ng/mL — ABNORMAL HIGH (ref <0.031)
Troponin I: 0.14 ng/mL — ABNORMAL HIGH (ref <0.031)
Troponin I: 0.14 ng/mL — ABNORMAL HIGH (ref <0.031)

## 2014-02-14 LAB — MRSA PCR SCREENING: MRSA by PCR: NEGATIVE

## 2014-02-14 LAB — T4, FREE: FREE T4: 0.84 ng/dL (ref 0.80–1.80)

## 2014-02-14 LAB — HEPARIN LEVEL (UNFRACTIONATED): Heparin Unfractionated: 1.05 IU/mL — ABNORMAL HIGH (ref 0.30–0.70)

## 2014-02-14 LAB — TSH: TSH: 6.652 u[IU]/mL — AB (ref 0.350–4.500)

## 2014-02-14 LAB — MAGNESIUM: MAGNESIUM: 2 mg/dL (ref 1.5–2.5)

## 2014-02-14 MED ORDER — ALBUTEROL SULFATE (2.5 MG/3ML) 0.083% IN NEBU: 3.0000 mL | INHALATION_SOLUTION | Freq: Four times a day (QID) | RESPIRATORY_TRACT | Status: DC | PRN

## 2014-02-14 MED ORDER — METOPROLOL TARTRATE 12.5 MG HALF TABLET
12.5000 mg | ORAL_TABLET | Freq: Two times a day (BID) | ORAL | Status: DC
  Filled 2014-02-14 (×2): qty 1

## 2014-02-14 MED ORDER — ACETAMINOPHEN 650 MG RE SUPP: 650.0000 mg | Freq: Four times a day (QID) | RECTAL | Status: DC | PRN

## 2014-02-14 MED ORDER — SODIUM CHLORIDE 0.9 % IV SOLN
INTRAVENOUS | Status: DC
  Administered 2014-02-14: via INTRAVENOUS

## 2014-02-14 MED ORDER — ENOXAPARIN SODIUM 80 MG/0.8ML ~~LOC~~ SOLN
1.0000 mg/kg | Freq: Two times a day (BID) | SUBCUTANEOUS | Status: DC
  Administered 2014-02-14 – 2014-02-17 (×6): 80 mg via SUBCUTANEOUS
  Filled 2014-02-14 (×7): qty 0.8

## 2014-02-14 MED ORDER — LEVOTHYROXINE SODIUM 50 MCG PO TABS
50.0000 ug | ORAL_TABLET | Freq: Every day | ORAL | Status: DC
  Administered 2014-02-14 – 2014-02-17 (×4): 50 ug via ORAL
  Filled 2014-02-14 (×5): qty 1

## 2014-02-14 MED ORDER — TRAMADOL HCL 50 MG PO TABS
50.0000 mg | ORAL_TABLET | Freq: Two times a day (BID) | ORAL | Status: DC | PRN
  Administered 2014-02-14 – 2014-02-17 (×3): 50 mg via ORAL
  Filled 2014-02-14 (×4): qty 1

## 2014-02-14 MED ORDER — ATORVASTATIN CALCIUM 80 MG PO TABS
80.0000 mg | ORAL_TABLET | Freq: Every day | ORAL | Status: DC
  Administered 2014-02-14 – 2014-02-17 (×4): 80 mg via ORAL
  Filled 2014-02-14 (×4): qty 1

## 2014-02-14 MED ORDER — ACETAMINOPHEN 325 MG PO TABS
650.0000 mg | ORAL_TABLET | ORAL | Status: AC | PRN
  Administered 2014-02-14 – 2014-02-15 (×3): 650 mg via ORAL

## 2014-02-14 MED ORDER — VITAMIN B-12 1000 MCG PO TABS
1000.0000 ug | ORAL_TABLET | Freq: Every day | ORAL | Status: DC
  Administered 2014-02-14 – 2014-02-17 (×4): 1000 ug via ORAL
  Filled 2014-02-14 (×4): qty 1

## 2014-02-14 MED ORDER — HEPARIN (PORCINE) IN NACL 100-0.45 UNIT/ML-% IJ SOLN
1000.0000 [IU]/h | INTRAMUSCULAR | Status: AC
  Filled 2014-02-14: qty 250

## 2014-02-14 MED ORDER — ACETAMINOPHEN 325 MG PO TABS
650.0000 mg | ORAL_TABLET | Freq: Four times a day (QID) | ORAL | Status: DC | PRN
  Administered 2014-02-14: 650 mg via ORAL
  Filled 2014-02-14 (×4): qty 2

## 2014-02-14 MED ORDER — ASPIRIN EC 325 MG PO TBEC
325.0000 mg | DELAYED_RELEASE_TABLET | Freq: Every day | ORAL | Status: DC
  Administered 2014-02-14 – 2014-02-16 (×3): 325 mg via ORAL
  Filled 2014-02-14 (×3): qty 1

## 2014-02-14 MED ORDER — ONDANSETRON HCL 4 MG/2ML IJ SOLN: 4.0000 mg | Freq: Four times a day (QID) | INTRAMUSCULAR | Status: DC | PRN

## 2014-02-14 MED ORDER — FAMOTIDINE 20 MG PO TABS
20.0000 mg | ORAL_TABLET | Freq: Two times a day (BID) | ORAL | Status: DC
  Administered 2014-02-14 – 2014-02-16 (×5): 20 mg via ORAL
  Filled 2014-02-14 (×6): qty 1

## 2014-02-14 MED ORDER — ONDANSETRON HCL 4 MG PO TABS: 4.0000 mg | ORAL_TABLET | Freq: Four times a day (QID) | ORAL | Status: DC | PRN

## 2014-02-14 NOTE — Progress Notes (Signed)
ANTICOAGULATION CONSULT NOTE - Initial Consult  Pharmacy Consult for heparin Indication: pulmonary embolus  Allergies  Allergen Reactions  . Codeine Shortness Of Breath    Shortness of breath  . Penicillins Shortness Of Breath  . Morphine And Related Itching    Patient Measurements: Height: 5\' 7"  (170.2 cm) Weight: 180 lb 1.9 oz (81.7 kg) IBW/kg (Calculated) : 66.1 Heparin Dosing Weight: 81.7kg  Vital Signs: Temp: 97.8 F (36.6 C) (12/26 0443) Temp Source: Oral (12/26 0443) BP: 138/61 mmHg (12/26 0520) Pulse Rate: 49 (12/26 0520)  Labs:  Recent Labs  02/13/14 1937 02/14/14 0132 02/14/14 0722  HGB 13.0  --  12.2*  HCT 39.7  --  37.0*  PLT 151  --  136*  HEPARINUNFRC  --   --  1.05*  CREATININE 1.29  --  1.24  TROPONINI 0.12* 0.13* 0.14*    Estimated Creatinine Clearance: 40.5 mL/min (by C-G formula based on Cr of 1.24).   Medical History: Past Medical History  Diagnosis Date  . Colon cancer     Status post partial colectomy  . Hypertension   . Hypercholesterolemia   . Blood transfusion   . DVT (deep venous thrombosis)     a. Prior h/o such. b. Recurrent subacute bilat DVT 2014.  . PE (pulmonary embolism)   . Diabetes mellitus   . Chronic atrial fibrillation   . Diabetes mellitus, type II   . Ventral hernia   . Unspecified hypothyroidism   . LVH (left ventricular hypertrophy)   . Bilateral renal cysts   . Calculus of left kidney     non-obstructing  . COPD (chronic obstructive pulmonary disease)   . Chronic combined systolic and diastolic CHF (congestive heart failure)     a. Echo 07/2013: EF 40-50%, mod LVH, mild MR, mildly dilated LA, mild-mod TR, PA pressure 29mmHg.  Marland Kitchen RBBB with left anterior fascicular block   . Bradycardia   . Pulmonary hypertension     a. Echo 07/2013: PASP 52mmHg.  . Mild mitral regurgitation     a. By echo 07/2013.  . Tricuspid regurgitation     a. Mild-mod by echo 07/2013.  . Orthostatic hypotension   . Dementia   . CAD  (coronary artery disease)     a. Nonobstructive by cath in 2008. b. Nuc 08/2013: low to intermediate risk. Imaging c/w scar in the inferior wall without any large ischemic territories. LVEF 37% with fairly diffuse hypokinesis.   . Syncope   . Ventral hernia   . Pulmonary nodules   . CKD (chronic kidney disease), stage III     a. based on historical lab data.    Medications:  Infusions:  . sodium chloride 10 mL/hr at 02/14/14 4132  . heparin 1,200 Units/hr (02/13/14 2322)    Assessment: 80 yo M presented to [facility] complaining of neck pain and syncope. CT angiogram shows new nonocclusive PE.  Patient has been on lovenox 100mg  q24h at home for prior PE/DVT. Pharmacy consulted to start heparin drip. Heparin infusion was started at 1200 units/hr with no bolus. 8hr HL is SUPRAtherapeutic at 1.05. Hgb 12.2, Plts 136 (down slightly from 12/25). No bleeding noted.  Goal of Therapy:  Heparin level 0.3-0.7 units/ml Monitor platelets by anticoagulation protocol: Yes   Plan:  Hold heparin infusion for 1 hour Restart heparin infusion at 1000 units/hr  8hr HL @ 1830 Daily HL/CBC Monitor s/sx of bleeding  Thank you for allowing pharmacy to be part of this patient's care team  [place], Pharm.D Clinical Pharmacy Resident Pager: [phone] 02/14/2014 .9:32 AM

## 2014-02-14 NOTE — Consult Note (Signed)
Cardiology Consultation Note  Patient ID: Bryce Mcdonald, MRN: [id number], DOB/AGE: 11-15-1921 80 y.o. Admit date: 02/13/2014   Date of Consult: 02/14/2014 Primary Physician: PROVIDER NOT [place] Primary Cardiologist: Bronson Ing Also followed at the [facility]  Chief Complaint: passed out Reason for Consult: syncope, elevated troponin, admitted with recurrent PE on Lovenox  HPI: Bryce Mcdonald is a 80 y/o M with history of HTN with h/o orthostatic hypotension, prior PE/DVT, chronic AF with bradycardia and RBBB/LAFB, nonobstructive CAD 2008 (low-intermediate risk nuc 08/2013 mostly c/w scar), chronic combined CHF (EF 40-50% in 07/2013), hypothyroidism, colon CA, DM, pulmonary hypertension, prior syncope, pulm nodules, reported h/o dementia (but A+Ox3 today) who presented to [facility] with syncope and diagnosis of recurrent PE. By historical labs it also appears he has CKD stage III although never formally titled. He has longstanding conduction disease and prior h/o bradycardia. Per Dr. Court Joy last note in 10/2013 he was off BB due to this but metoprolol was on his admit med list back to 11/2013.   Review of recent history reveals several hospital admissions in 2015. He was admitted 07/2013 for SOB/CHF requring IV lasix, in 08/2013 for syncope felt due to dehydration/orthostasis, and 11/2013 for flank pain and hyperkalemia. His last thrombotic event per [facility] records prior to this admission was subacute bilateral DVTs in 09/2012 while on Lovenox injections but with subtherapeutic dosing. It appears care is also managed through [place]. He has been on several blood thinners. Notes in 07/2013 indicate he was on Coumadin although INR was 1.07 - discharged on Lovenox at that time. By 08/2013 he was on Xarelto as well as when seen in the office, and by 11/2013 admission he was back on Lovenox. He says he lives by himself and manages his own medicines including giving himself his own injections.  He has not been feeling well  for 2 weeks with diffuse muscle aches, back pain and leg cramps as well as right facial parasthesias. On the evening of admission he says he was sitting on the side of his bed about to go change to his wheelchair when everything went dark, he felt some neck pain, and he passed out. Upon waking he called his doctor and was taken to the ER. He denies any CP with inspiration, dyspnea, nausea, vomiting but did have some diarrhea. Today he feels generally achy all over.  Labs reveal troponin 0.12->0.13. CBC with leukopenia and macrocytosis, elevated lymphs%. Glucose 130, BUN 24, Cr 1.29, BNP 144. Otherwise nonacute. CXR: stable COPD/emphysema. CT head nonacute with stable atrophy/chronic ischemic changes. CT angio: nonocclusive RLL segmental PE, evidence of R heart strain unchanged from prior CT, stable pulm nodules. Tele with CAF and slow ventricular response at times down to 30's overnight at times, occasionally 40s, currently maintaining in 50s-60s - longest pause appears 2.3 sec, 6 beats NSVT. He is said to have dementia - he is clear this AM but apparently was hallucinating last night in the ED.  Past Medical History  Diagnosis Date  . Colon cancer     Status post partial colectomy  . Hypertension   . Hypercholesterolemia   . Blood transfusion   . DVT (deep venous thrombosis)     a. Prior h/o such. b. Recurrent subacute bilat DVT 2014.  . PE (pulmonary embolism)   . Diabetes mellitus   . Chronic atrial fibrillation   . Diabetes mellitus, type II   . Ventral hernia   . Unspecified hypothyroidism   . LVH (left ventricular hypertrophy)   .  Bilateral renal cysts   . Calculus of left kidney     non-obstructing  . COPD (chronic obstructive pulmonary disease)   . Chronic combined systolic and diastolic CHF (congestive heart failure)     a. Echo 07/2013: EF 40-50%, mod LVH, mild MR, mildly dilated LA, mild-mod TR, PA pressure 70mmHg.  Marland Kitchen RBBB with left anterior fascicular block   . Bradycardia   .  Pulmonary hypertension     a. Echo 07/2013: PASP 41mmHg.  . Mild mitral regurgitation     a. By echo 07/2013.  . Tricuspid regurgitation     a. Mild-mod by echo 07/2013.  . Orthostatic hypotension   . Dementia   . CAD (coronary artery disease)     a. Nonobstructive by cath in 2008. b. Nuc 08/2013: low to intermediate risk. Imaging c/w scar in the inferior wall without any large ischemic territories. LVEF 37% with fairly diffuse hypokinesis.   . Syncope   . Ventral hernia   . Pulmonary nodules   . CKD (chronic kidney disease), stage III     a. based on historical lab data.      Most Recent Cardiac Studies: Nuc 08/2013 IMPRESSION: Low to intermediate risk Lexiscan Cardiolite. There were no diagnostic ST segment abnormalities, atrial fibrillation present throughout. Perfusion imaging is most consistent with scar in the inferior wall without any large ischemic territories. LVEF is calculated at 37% with fairly diffuse hypokinesis and normal volumes.  2D Echo 07/2013 - Left ventricle: The cavity size was normal. Wall thickness was increased in a pattern of moderate LVH. Systolic function wasmildly to moderately reduced. The estimated ejection fraction wasin the range of 40% to 50%. Diffuse hypokinesis. The study is nottechnically sufficient to allow evaluation of LV diastolicfunction. - Mitral valve: Calcified annulus. There was mild regurgitation. - Left atrium: The atrium was mildly dilated. - Right ventricle: Systolic function was mildly reduced. - Right atrium: Central venous pressure (est): 15 mm Hg. - Tricuspid valve: There was mild-moderate regurgitation. - Pulmonary arteries: PA peak pressure: 60 mm Hg (S). - Pericardium, extracardiac: A trivial pericardial effusion was identified. Impressions: - Study was performed on June 7, incorrectly filed for interpretation by the sonographer. I was asked to read the studyon June 8. There is moderate LVH with LVEF 40-50% in the  settingof apparent atrial fibrillation. Indeterminate diastolic function. Mild left atrial enlargement. MAC with mild mitral regurgitation. Sclerotic aortic valve. Mildly reduced RV contraction. Mild to moderate tricuspid regurgitation withelevated PASP 60 mmHg and increased CVP. Trivial pericardial effusion.  Cardiac Cath 02/2006 HISTORY: Mr. Solomon Skowronek is an 80 year old African-American malewho had initially presented [facility] with respiratorydistress, tachycardic and hypertensive. He was transferred to [facility]. His symptoms improved. He also was noticed to haveparoxysmal atrial fibrillation. Apparently an echo had suggested an EFof 20-30% and anteroseptal apical akinesis. He is referred fordefinitive diagnostic cardiac catheterization. PROCEDURE: After premedication with Valium 4 mg intravenously thepatient prepped and draped in usual fashion. His right femoral arterywas punctured anteriorly and a 5-French sheath was inserted. Diagnosticcatheterization was done utilizing 5-French Judkins for left and rightcoronary catheters. A pigtail catheter was used for biplanecineangiography as well as distal aortography. Hemostasis obtained bydirect manual pressure. The patient tolerated the procedure well and returned to his room in satisfactory condition. HEMODYNAMIC DATA: Central aortic pressure is 125/54. Left ventricularpressure is 125/17. Angiographic data: Left main coronary artery had mild 20% ostial taperand otherwise was normal and bifurcated into an LAD and left circumflexsystem. The LAD gave rise  to a proximal diagonal vessel. Between the 1st and2nd diagonal vessel was mild luminal irregularity of 20%. After the 2nddiagonal vessel there was mild luminal irregularity of 20-30% in the midLAD segment. The LAD did extend and wrap around the LV apex. The circumflex vessel was moderate-sized vessel, gave rise to a small1st  marginal branch. The 2nd marginal branch was moderate size and haddiffuse smooth 50% narrowing in this marginal vessel.The right coronary artery was moderate-sized, somewhat tortuous vesselthat had mild 20% narrowing prior to the crux. The RCA supplied the PDA and PLA vessel. Biplane cineangiography revealed preserved global contractility with anejection fraction of 50-55%. There was left ventricular hypertrophy.There was a suggestion of a minimal apical inferior hypocontractility onthe RAO projection. On the LAO projection there was subtle focal apical septal hypocontractility. Distal aortography did not demonstrate any renal artery stenosis. Therewas no significant aortoiliac disease. IMPRESSION: 1. Low normal left ventricular function with an ejection fraction of 50-55% with left ventricular hypertrophy and subtle focal apical septal, apical inferior hypocontractility. 2. Mild to moderate coronary obstructive disease with 20% left main ostial tapering, 20 and 30% proximal and mid LAD stenoses, diffuse 50% narrowing in the circumflex marginal vessel and 20% narrowing in the right coronary artery.  RECOMMENDATIONS: Medical therapy.  ______________________________ Shelva Majestic, M.D. TK/MEDQ D: 02/26/2006 T: 02/26/2006 Job: [id number] cc: Quay Burow, M.D. [facility]    Surgical History:  Past Surgical History  Procedure Laterality Date  . Abdominal surgery      x 3  . Cardiac catheterization  2008     Home Meds: Prior to Admission medications   Medication Sig Start Date End Date Taking? Authorizing Provider  acetaminophen (TYLENOL) 325 MG tablet Take 650 mg by mouth every 6 (six) hours as needed for mild pain or fever.   Yes Historical Provider, MD  albuterol (PROVENTIL HFA) 108 (90 BASE) MCG/ACT inhaler Inhale 2 puffs into the lungs every 6 (six) hours as needed for wheezing or shortness of breath.   Yes  Historical Provider, MD  aspirin 81 MG chewable tablet Chew 81 mg by mouth daily.   Yes Historical Provider, MD  atorvastatin (LIPITOR) 80 MG tablet Take 80 mg by mouth daily.   Yes Historical Provider, MD  enoxaparin (LOVENOX) 100 MG/ML injection Inject 100 mg into the skin daily.   Yes Historical Provider, MD  famotidine (PEPCID) 20 MG tablet Take 1 tablet (20 mg total) by mouth 2 (two) times daily. 12/06/12  Yes Lezlie Octave Black, NP  furosemide (LASIX) 20 MG tablet Take 1 tablet (20 mg total) by mouth daily as needed for fluid or edema. Patient taking differently: Take 20 mg by mouth daily.  09/09/13  Yes Costin Karlyne Greenspan, MD  levothyroxine (SYNTHROID, LEVOTHROID) 50 MCG tablet Take 50 mcg by mouth daily before breakfast.   Yes Historical Provider, MD  metoprolol tartrate (LOPRESSOR) 25 MG tablet Take 12.5 mg by mouth 2 (two) times daily.   Yes Historical Provider, MD  vitamin B-12 (CYANOCOBALAMIN) 1000 MCG tablet Take 1,000 mcg by mouth daily.   Yes Historical Provider, MD  predniSONE (DELTASONE) 20 MG tablet Take 3 tablets (60 mg total) by mouth daily with breakfast. Patient not taking: Reported on 02/13/2014 01/10/14   Merryl Hacker, MD    Inpatient Medications:  . aspirin EC  325 mg Oral Daily  . atorvastatin  80 mg Oral Daily  . famotidine  20 mg Oral BID  . levothyroxine  50 mcg Oral QAC breakfast  .  metoprolol tartrate  12.5 mg Oral BID  . vitamin B-12  1,000 mcg Oral Daily   . sodium chloride 10 mL/hr at 02/14/14 0027  . heparin 1,200 Units/hr (02/13/14 2322)    Allergies:  Allergies  Allergen Reactions  . Codeine Shortness Of Breath    Shortness of breath  . Penicillins Shortness Of Breath  . Morphine And Related Itching    History   Social History  . Marital Status: Widowed    Spouse Name: N/A    Number of Children: 8  . Years of Education: N/A   Occupational History  . Not on file.   Social History Main Topics  . Smoking status: Former Smoker -- 0.50  packs/day    Types: Cigarettes    Start date: 11/15/1943    Quit date: 02/20/1973  . Smokeless tobacco: Former Systems developer    Types: Chew     Comment: quit over 40 years ago  . Alcohol Use: No     Comment: quit over forty years ago  . Drug Use: No  . Sexual Activity: No   Other Topics Concern  . Not on file   Social History Narrative     Family History  Problem Relation Age of Onset  . Hypertension       Review of Systems: No bleeding issues. All other systems reviewed and are otherwise negative except as noted above.  Labs:  Recent Labs  02/13/14 1937 02/14/14 0132  TROPONINI 0.12* 0.13*   Lab Results  Component Value Date   WBC 3.7* 02/13/2014   HGB 13.0 02/13/2014   HCT 39.7 02/13/2014   MCV 101.0* 02/13/2014   PLT 151 02/13/2014     Recent Labs Lab 02/13/14 1937  NA 139  K 3.7  CL 103  CO2 26  BUN 24*  CREATININE 1.29  CALCIUM 9.3  PROT 7.3  BILITOT 0.3  ALKPHOS 82  ALT 15  AST 22  GLUCOSE 130*   Lab Results  Component Value Date   CHOL 106 12/04/2012   HDL 27* 12/04/2012   LDLCALC 55 12/04/2012   TRIG 120 12/04/2012     Radiology/Studies:  Dg Chest 1 View  02/13/2014   CLINICAL DATA:  Right facial paresthesias, right neck pain, coronary disease, atrial fibrillation  EXAM: CHEST - 1 VIEW  COMPARISON:  01/10/2014  FINDINGS: Background COPD/ emphysema with parenchymal scarring in the right lower lobe as before. Normal heart size and vascularity. Atherosclerosis of the aorta noted. No superimposed edema, pneumonia, collapse or consolidation. No enlarging effusion or pneumothorax. Trachea midline. Atherosclerosis noted of the aorta. Stable exam.  IMPRESSION: Stable COPD/emphysema with right lower lobe parenchymal scarring.   Electronically Signed   By: Daryll Brod M.D.   On: 02/13/2014 19:22   Ct Head Wo Contrast  02/13/2014   CLINICAL DATA:  Acute syncopal episode  EXAM: CT HEAD WITHOUT CONTRAST  TECHNIQUE: Contiguous axial images were obtained  from the base of the skull through the vertex without contrast.  COMPARISON:  11/16/2013  FINDINGS: Limited with some motion artifact. Stable brain atrophy pattern with chronic white matter microvascular ischemic changes. No acute intracranial hemorrhage, mass lesion, new infarction, midline shift, herniation, hydrocephalus, or extra-axial fluid collection. No focal mass effect or edema. Patent cisterns. Cerebellar atrophy as well. Orbits are symmetric. Mastoids and sinuses are clear. Atherosclerosis of the intracranial vessels at the skull base. Skull appears intact.  IMPRESSION: Stable atrophy and chronic white matter ischemic changes. No interval change or acute process by  noncontrast CT.   Electronically Signed   By: Daryll Brod M.D.   On: 02/13/2014 19:12   Ct Angio Chest Aorta W/cm &/or Wo/cm  02/13/2014   CLINICAL DATA:  Cough, weakness, shortness of breath, altered mental status. Symptoms began this morning, syncope. RIGHT facial numbness for 2 days. Hallucinations.  EXAM: CT ANGIOGRAPHY CHEST WITH CONTRAST  TECHNIQUE: Multidetector CT imaging of the chest was performed using the standard protocol during bolus administration of intravenous contrast. Multiplanar CT image reconstructions and MIPs were obtained to evaluate the vascular anatomy.  CONTRAST:  126mL OMNIPAQUE IOHEXOL 350 MG/ML SOLN  COMPARISON:  Chest radiograph February 13, 2014 at 1857 hr ; CT angiogram of the chest October 10, 2012.  FINDINGS: Adequate pulmonary arterial contrast opacification. Main pulmonary artery is not enlarged. Nonocclusive RIGHT lower lobe segmental pulmonary embolus, axial 182/291. A tiny vein courses from the LEFT subclavian vein towards the inferior aspect of the heart, suggesting duplicated superior vena cava, congenital variant.  The heart size is upper limits of normal. RIGHT heart strain (RV/LV is 1). Small pericardial effusion. Thoracic aorta is normal in course and caliber with moderate calcific  atherosclerosis. No lymphadenopathy by CT size criteria. Subcentimeter pretracheal, subcarinal lymph nodes.  Lung base atelectasis/scarring, unchanged. Stable scattered pulmonary nodules, measuring up to 6 mm in RIGHT middle lobe.  Included view of the abdomen is not acute; splenic granulomas. Osseous structures are nonsuspicious. Similar degenerative change of the thoracic spine.  Review of the MIP images confirms the above findings.  IMPRESSION: Nonocclusive RIGHT lower lobe segmental pulmonary embolus appears acute. CT evidence of right heart strain appears unchanged from prior imaging. (RV/LV Ratio = Positive for acute PE with CTevidence of right heart strain (RV/LV Ratio = 1) consistent with at least submassive (intermediate risk) PE. The presence of right heart strain has been associated with an increased risk of morbidity and mortality.) consistent with at least submassive (intermediate risk) PE. The presence of right heart strain has been associated with an increased risk of morbidity and mortality.  Stable pulmonary nodules.  Acute findings discussed with and reconfirmed by Dr.Pickerington on 02/13/2014 at 10:26 pm.   Electronically Signed   By: Elon Alas   On: 02/13/2014 22:28    EKG: atrial fibrillation 65bpm, RBBB, LBBB, old inferior infarct  Physical Exam: Blood pressure 138/61, pulse 49, temperature 97.8 F (36.6 C), temperature source Oral, resp. rate 21, height 5\' 7"  (1.702 m), weight 180 lb 1.9 oz (81.7 kg), SpO2 95 %. General: Chronically ill AAM in no acute distress. Head: Normocephalic, atraumatic, sclera non-icteric, no xanthomas, nares are without discharge.  Neck: Difficult to auscultate carotid bruits because patient has a tendency to grunt. JVD not elevated. Lungs:Coarse without wheezes, rales, or rhonchi. Breathing is unlabored. Heart: Irregularly irregular, controlled VR. No murmurs, rubs, or gallops appreciated. Abdomen: Soft, non-tender, non-distended with  normoactive bowel sounds. No hepatomegaly. No rebound/guarding. No obvious abdominal masses. Msk:  Strength and tone appear normal for age. Extremities: No clubbing or cyanosis. No edema.  Distal pedal pulses are 2+ and equal bilaterally. Neuro: Alert and oriented X 3. No facial asymmetry. No focal deficit. Moves all extremities spontaneously. Psych:  Responds to questions appropriately with a normal affect.   Assessment and Plan:  1. Recurrent PE with history of DVTs/PE, h/o pulm HTN in 07/2013 - per IM notes the patient missed his Lovenox dose 4 weeks ago but he reports compliance to me. Given his debilitated-appearing state and intermittent dementia we are not able  to prove for certain that he was compliant with medications. It is also not clear why he has been changed back and forth between blood thinners because this was done at outside [facility] ([facility] - [facility] to Coumadin to Lovenox to Xarelto to Lovenox per 2015 notes here). The safest thing may ultimately be placement to ensure compliance with medications and close outpatient follow-up with continuity of care. We have placed a social work consult. Further decision making per IM.  2. Syncope - he has multiple reasons why this might have happened. Differential includes 2/2 PE, bradycardia/pauses in the setting of known long-standing conduction disease, pulmonary HTN or neurologic pathology. Orthostatics were negative in the ED. Given his advanced age from a cardiac standpoint, we plan to stop BB and observe further on telemetry. If he has evidence of symptomatic bradycardia or further NSVT would consider EP input. Checking Mg, TSH, free T4. Consider d/c of IVF given h/o CHF and negative orthostatics - will defer to IM. 3. Chronic atrial fibrillation with long-standing history of conduction disease with bradycardia, RBBB, LAFB - discontinue metoprolol and follow HR for correlation with any symptoms. Check TSH, free T4. 4. Elevated troponin likely r/t  right heart strain - he denies anginal chest pain or SOB. May be related to PE. Given his advanced age, CKD, h/o recurrent DVT/PE we feel conservative management is most appropriate. Echo previously ordered by primary team but not sure it will change our management. 5. Chronic combined CHF EF 40-50% in 07/2013 - euvolemic. No ACEI-ARB at present time with h/o orthostasis. 6. Nonobstructive CAD 2008, low-intermediate risk nuc 08/2013 - consider decreasing ASA to 81mg  daily. Continue statin. D/c BB as above. 7. Reported h/o dementia with intermittent hallucinations - consider placement at discharge. 8. CKD stage III - f/u BMET stable.   Signed, Melina Copa PA-C 02/14/2014, 8:32 AM   Agree with note by Melina Copa PA-C  Pt with recurrent PE and syncope. Obviously there are multiple possible causes for this in 80 year old with multiple co morbidities. I'm not sure he's the best and most reliable AC candidate. He may be better served with IVC filter. Would pursue SNF. + Trop prob from PE.   Lorretta Harp, M.D., [place], [facility], Laverta [place] Kay [address] [place], [place]  [id number]  [phone] 02/14/2014 11:48 AM

## 2014-02-14 NOTE — Progress Notes (Signed)
Patient seen and evaluated earlier this AM by my associate. Consulted Hematologist who recommended obtaining an anti 10 a level and placing on BID lovenox dosing. He will assess next am.  Agree with cardiologist to saline lock IVF's.  VSS and BB to be held for further decision making from cardiologist.  Will reassess next am  Cambridge Deleo, Linward Foster

## 2014-02-15 DIAGNOSIS — I059 Rheumatic mitral valve disease, unspecified: Secondary | ICD-10-CM

## 2014-02-15 DIAGNOSIS — Z86711 Personal history of pulmonary embolism: Secondary | ICD-10-CM

## 2014-02-15 DIAGNOSIS — Z7901 Long term (current) use of anticoagulants: Secondary | ICD-10-CM

## 2014-02-15 LAB — CBC
HCT: 35.9 % — ABNORMAL LOW (ref 39.0–52.0)
HEMOGLOBIN: 11.4 g/dL — AB (ref 13.0–17.0)
MCH: 31.3 pg (ref 26.0–34.0)
MCHC: 31.8 g/dL (ref 30.0–36.0)
MCV: 98.6 fL (ref 78.0–100.0)
PLATELETS: 130 10*3/uL — AB (ref 150–400)
RBC: 3.64 MIL/uL — ABNORMAL LOW (ref 4.22–5.81)
RDW: 14 % (ref 11.5–15.5)
WBC: 3.2 10*3/uL — ABNORMAL LOW (ref 4.0–10.5)

## 2014-02-15 NOTE — Consult Note (Signed)
[facility] CONSULT NOTE  Patient Care Team: Provider Not In System as PCP - General Herminio Commons, MD as Attending Physician (Cardiology)  CHIEF COMPLAINTS/PURPOSE OF CONSULTATION:  Recurrent blood clots on Lovenox  HISTORY OF PRESENTING ILLNESS:  Bryce Mcdonald 80 y.o. male is here because of recent diagnosis of recurrent pulmonary embolism. He has a long-standing history of blood clots dating back to 1965. Most recent blood clot was October 2015 when he was switched from Xarelto to Lovenox. He has been giving himself once a day Lovenox injections. He came into the hospital complaining of dizziness and syncope. He had a CT of the chest that showed pulmonary embolism. Since he was already on once a day Lovenox injections, we're consulted to evaluate his treatment options. Patient had in the past been on Coumadin, Xarelto as well as once a day Lovenox. His only complaint today is his pain in his neck and his lower back. He denies any chest pain or shortness of breath or exertion. His other complaint is that his dizzy spells. Uses a wheelchair to get around at his house. He had one episode of fall in the past.   MEDICAL HISTORY:  Past Medical History  Diagnosis Date  . Colon cancer     Status post partial colectomy  . Hypertension   . Hypercholesterolemia   . Blood transfusion   . DVT (deep venous thrombosis)     a. Prior h/o such. b. Recurrent subacute bilat DVT 2014.  . PE (pulmonary embolism)   . Diabetes mellitus   . Chronic atrial fibrillation   . Diabetes mellitus, type II   . Ventral hernia   . Unspecified hypothyroidism   . LVH (left ventricular hypertrophy)   . Bilateral renal cysts   . Calculus of left kidney     non-obstructing  . COPD (chronic obstructive pulmonary disease)   . Chronic combined systolic and diastolic CHF (congestive heart failure)     a. Echo 07/2013: EF 40-50%, mod LVH, mild MR, mildly dilated LA, mild-mod TR, PA pressure 69mmHg.   Marland Kitchen RBBB with left anterior fascicular block   . Bradycardia   . Pulmonary hypertension     a. Echo 07/2013: PASP 61mmHg.  . Mild mitral regurgitation     a. By echo 07/2013.  . Tricuspid regurgitation     a. Mild-mod by echo 07/2013.  . Orthostatic hypotension   . Dementia   . CAD (coronary artery disease)     a. Nonobstructive by cath in 2008. b. Nuc 08/2013: low to intermediate risk. Imaging c/w scar in the inferior wall without any large ischemic territories. LVEF 37% with fairly diffuse hypokinesis.   . Syncope   . Ventral hernia   . Pulmonary nodules   . CKD (chronic kidney disease), stage III     a. based on historical lab data.    SURGICAL HISTORY: Past Surgical History  Procedure Laterality Date  . Abdominal surgery      x 3  . Cardiac catheterization  2008    SOCIAL HISTORY: History   Social History  . Marital Status: Widowed    Spouse Name: N/A    Number of Children: 8  . Years of Education: N/A   Occupational History  . Not on file.   Social History Main Topics  . Smoking status: Former Smoker -- 0.50 packs/day    Types: Cigarettes    Start date: 11/15/1943    Quit date: 02/20/1973  . Smokeless tobacco: Former Systems developer  Types: Chew     Comment: quit over 40 years ago  . Alcohol Use: No     Comment: quit over forty years ago  . Drug Use: No  . Sexual Activity: No   Other Topics Concern  . Not on file   Social History Narrative    FAMILY HISTORY: Family History  Problem Relation Age of Onset  . Hypertension      ALLERGIES:  is allergic to codeine; penicillins; and morphine and related.  MEDICATIONS:  Current Facility-Administered Medications  Medication Dose Route Frequency Provider Last Rate Last Dose  . 0.9 %  sodium chloride infusion   Intravenous Continuous Rise Patience, MD 10 mL/hr at 02/15/14 0600    . acetaminophen (TYLENOL) tablet 650 mg  650 mg Oral Q6H PRN Rise Patience, MD   650 mg at 02/14/14 2118   Or  .  acetaminophen (TYLENOL) suppository 650 mg  650 mg Rectal Q6H PRN Rise Patience, MD      . acetaminophen (TYLENOL) tablet 650 mg  650 mg Oral Q4H PRN Lacy Duverney, PA-C   650 mg at 02/14/14 1506  . albuterol (PROVENTIL) (2.5 MG/3ML) 0.083% nebulizer solution 3 mL  3 mL Inhalation Q6H PRN Rise Patience, MD      . aspirin EC tablet 325 mg  325 mg Oral Daily Rise Patience, MD   325 mg at 02/14/14 1016  . atorvastatin (LIPITOR) tablet 80 mg  80 mg Oral Daily Rise Patience, MD   80 mg at 02/14/14 1016  . enoxaparin (LOVENOX) injection 80 mg  1 mg/kg Subcutaneous Q12H Osborne Oman, RPH   80 mg at 02/14/14 2119  . famotidine (PEPCID) tablet 20 mg  20 mg Oral BID Rise Patience, MD   20 mg at 02/14/14 2118  . levothyroxine (SYNTHROID, LEVOTHROID) tablet 50 mcg  50 mcg Oral QAC breakfast Rise Patience, MD   50 mcg at 02/14/14 1016  . ondansetron (ZOFRAN) tablet 4 mg  4 mg Oral Q6H PRN Rise Patience, MD       Or  . ondansetron [facility]) injection 4 mg  4 mg Intravenous Q6H PRN Rise Patience, MD      . traMADol Veatrice Bourbon) tablet 50 mg  50 mg Oral Q12H PRN Ritta Slot, NP   50 mg at 02/14/14 2118  . vitamin B-12 (CYANOCOBALAMIN) tablet 1,000 mcg  1,000 mcg Oral Daily Rise Patience, MD   1,000 mcg at 02/14/14 1016    REVIEW OF SYSTEMS:   Constitutional: Denies fevers, chills or abnormal night sweats Eyes: Dizziness when he moves his head in certain way Ears, nose, mouth, throat, and face: Denies mucositis or sore throat Respiratory: Denies cough, dyspnea or wheezes Cardiovascular: Denies palpitation, chest discomfort  Gastrointestinal:  Denies nausea, heartburn or change in bowel habits Neurological:Denies numbness, tingling or new weaknesses, occasional presyncopal changes Behavioral/Psych: Mood is stable, no new changes  All other systems were reviewed with the patient and are negative.  PHYSICAL EXAMINATION: ECOG PERFORMANCE STATUS: 3 - Symptomatic,  >50% confined to bed  Filed Vitals:   02/15/14 0756  BP:   Pulse:   Temp: 97.5 F (36.4 C)  Resp:    Filed Weights   02/13/14 1801 02/14/14 0023  Weight: 175 lb (79.379 kg) 180 lb 1.9 oz (81.7 kg)    GENERAL:alert, no distress and comfortable SKIN: skin color, texture, turgor are normal, no rashes or significant lesions LYMPH:  no palpable lymphadenopathy  in the cervical, axillary or inguinal LUNGS: clear to auscultation and percussion with normal breathing effort HEART: regular rate & rhythm and no murmurs ABDOMEN:abdomen soft, non-tender and normal bowel sounds Musculoskeletal:no cyanosis of digits and no clubbing  PSYCH: alert & oriented x 3 with fluent speech NEURO: no focal motor/sensory deficits  LABORATORY DATA:  I have reviewed the data as listed Lab Results  Component Value Date   WBC 3.2* 02/15/2014   HGB 11.4* 02/15/2014   HCT 35.9* 02/15/2014   MCV 98.6 02/15/2014   PLT 130* 02/15/2014   Lab Results  Component Value Date   NA 140 02/14/2014   K 3.8 02/14/2014   CL 104 02/14/2014   CO2 29 02/14/2014    RADIOGRAPHIC STUDIES: I have personally reviewed the radiological reports and agreed with the findings in the report. CT of the chest 02/12/2014 showed nonocclusive right lower lobe segmental pulmonary embolus with right heart strain unchanged from before  ASSESSMENT AND PLAN:  1. Acute pulmonary embolism on Lovenox injections. I suspect that he was subtherapeutic on the Lovenox. We obtained an anti-10 A level yesterday for documentation. I recommended 1 mg/kg subcutaneous twice a day as a standard treatment option. Patient appears to understand this and is willing to give himself twice a day Lovenox shots. I discussed with him that there are risks of bleeding. Especially if he has falls. Patient is fairly elderly with very limited social support. He understood these risks and plans to do twice a day Lovenox shots.  I suspect that the anti-10 A level will  improve that he was subtherapeutic. Even otherwise, given his extensive clotting history, he should remain on Lovenox injections twice a day for life as long as it was felt to be safe.  Please do not hesitate to call me with any further questions or concerns.    Rulon Eisenmenger, MD @T @ 11:16 AM

## 2014-02-15 NOTE — Progress Notes (Signed)
ANTICOAGULATION CONSULT NOTE - Follow Up Consult  Pharmacy Consult for transition from heparin to lovenox Indication: pulmonary embolus  Allergies  Allergen Reactions  . Codeine Shortness Of Breath    Shortness of breath  . Penicillins Shortness Of Breath  . Morphine And Related Itching    Patient Measurements: Height: 5\' 7"  (170.2 cm) Weight: 180 lb 1.9 oz (81.7 kg) IBW/kg (Calculated) : 66.1 Heparin Dosing Weight: 81.7kg  Vital Signs: Temp: 97.9 F (36.6 C) (12/27 0422) Temp Source: Oral (12/27 0422) BP: 132/71 mmHg (12/27 0422) Pulse Rate: 55 (12/27 0422)  Labs:  Recent Labs  02/13/14 1937 02/14/14 0132 02/14/14 0722 02/14/14 1220 02/15/14 0305  HGB 13.0  --  12.2*  --  11.4*  HCT 39.7  --  37.0*  --  35.9*  PLT 151  --  136*  --  130*  HEPARINUNFRC  --   --  1.05*  --   --   CREATININE 1.29  --  1.24  --   --   TROPONINI 0.12* 0.13* 0.14* 0.14*  --     Estimated Creatinine Clearance: 40.5 mL/min (by C-G formula based on Cr of 1.24).  Assessment: 80 yo M with new diagnoses of PE. Patient was started on heparin and transferred [facility] to [facility] for cardiac workup. Pharmacy continued to dose heparin. Pharmacy consulted to transition from heparin to lovenox after 24 hours. To avoid patient having to wake up to get lovenox dose, will turn heparin off after 22 hours and begin lovenox 1 hour after heparin infusion is turned off. Hgb/Hct normal, plts dropped from 151>>136. No bleeding noted.  Goal of Therapy:  Prevention of further blood clot formation  Monitor platelets by anticoagulation protocol: Yes   Plan:  Heparin to turn off @ 2100 Begin lovenox 80mg  SQ BID @ 2200 Monitor s/sx of bleeding Pharmacy signing off, will monitor peripherally   Thank you for allowing pharmacy to be part of this patient's care team  [place], Pharm.D Clinical Pharmacy Resident Pager: [phone] 02/14/2014

## 2014-02-15 NOTE — Progress Notes (Signed)
Subjective:  No CP/SOB  Objective:  Temp:  [97.4 F (36.3 C)-98.5 F (36.9 C)] 97.5 F (36.4 C) (12/27 0756) Pulse Rate:  [51-80] 55 (12/27 0708) Resp:  [15-27] 22 (12/27 0708) BP: (123-157)/(54-78) 123/73 mmHg (12/27 0708) SpO2:  [93 %-97 %] 97 % (12/27 0708) Weight change:   Intake/Output from previous day: 12/26 0701 - 12/27 0700 In: 932.5 [P.O.:480; I.V.:452.5] Out: 1375 [Urine:1375]  Intake/Output from this shift: Total I/O In: 270 [P.O.:240; I.V.:30] Out: 175 [Urine:175]  Physical Exam: General appearance: alert and no distress Neck: no adenopathy, no carotid bruit, no JVD, supple, symmetrical, trachea midline and thyroid not enlarged, symmetric, no tenderness/mass/nodules Lungs: clear to auscultation bilaterally Heart: irregularly irregular rhythm Extremities: extremities normal, atraumatic, no cyanosis or edema  Lab Results: Results for orders placed or performed during the hospital encounter of 02/13/14 (from the past 48 hour(s))  CBC with Differential     Status: Abnormal   Collection Time: 02/13/14  7:37 PM  Result Value Ref Range   WBC 3.7 (L) 4.0 - 10.5 K/uL   RBC 3.93 (L) 4.22 - 5.81 MIL/uL   Hemoglobin 13.0 13.0 - 17.0 g/dL   HCT 39.7 39.0 - 52.0 %   MCV 101.0 (H) 78.0 - 100.0 fL   MCH 33.1 26.0 - 34.0 pg   MCHC 32.7 30.0 - 36.0 g/dL   RDW 14.0 11.5 - 15.5 %   Platelets 151 150 - 400 K/uL   Neutrophils Relative % 24 (L) 43 - 77 %   Neutro Abs 0.9 (L) 1.7 - 7.7 K/uL   Lymphocytes Relative 66 (H) 12 - 46 %   Lymphs Abs 2.4 0.7 - 4.0 K/uL   Monocytes Relative 8 3 - 12 %   Monocytes Absolute 0.3 0.1 - 1.0 K/uL   Eosinophils Relative 1 0 - 5 %   Eosinophils Absolute 0.0 0.0 - 0.7 K/uL   Basophils Relative 0 0 - 1 %   Basophils Absolute 0.0 0.0 - 0.1 K/uL  Comprehensive metabolic panel     Status: Abnormal   Collection Time: 02/13/14  7:37 PM  Result Value Ref Range   Sodium 139 135 - 145 mmol/L    Comment: Please note change in reference  range.   Potassium 3.7 3.5 - 5.1 mmol/L    Comment: Please note change in reference range.   Chloride 103 96 - 112 mEq/L   CO2 26 19 - 32 mmol/L   Glucose, Bld 130 (H) 70 - 99 mg/dL   BUN 24 (H) 6 - 23 mg/dL   Creatinine, Ser 1.29 0.50 - 1.35 mg/dL   Calcium 9.3 8.4 - 10.5 mg/dL   Total Protein 7.3 6.0 - 8.3 g/dL   Albumin 4.1 3.5 - 5.2 g/dL   AST 22 0 - 37 U/L   ALT 15 0 - 53 U/L   Alkaline Phosphatase 82 39 - 117 U/L   Total Bilirubin 0.3 0.3 - 1.2 mg/dL   GFR calc non Af Amer 47 (L) >90 mL/min   GFR calc Af Amer 55 (L) >90 mL/min    Comment: (NOTE) The eGFR has been calculated using the CKD EPI equation. This calculation has not been validated in all clinical situations. eGFR's persistently <90 mL/min signify possible Chronic Kidney Disease.    Anion gap 10 5 - 15  Troponin I     Status: Abnormal   Collection Time: 02/13/14  7:37 PM  Result Value Ref Range   Troponin I 0.12 (H) <0.031  ng/mL    Comment:        PERSISTENTLY INCREASED TROPONIN VALUES IN THE RANGE OF 0.04-0.49 ng/mL CAN BE SEEN IN:       -UNSTABLE ANGINA       -CONGESTIVE HEART FAILURE       -MYOCARDITIS       -CHEST TRAUMA       -ARRYHTHMIAS       -LATE PRESENTING MYOCARDIAL INFARCTION       -COPD   CLINICAL FOLLOW-UP RECOMMENDED. Please note change in reference range.   Brain natriuretic peptide     Status: Abnormal   Collection Time: 02/13/14  7:37 PM  Result Value Ref Range   B Natriuretic Peptide 144.0 (H) 0.0 - 100.0 pg/mL    Comment: Please note change in reference range.  MRSA PCR Screening     Status: None   Collection Time: 02/14/14  1:07 AM  Result Value Ref Range   MRSA by PCR NEGATIVE NEGATIVE    Comment:        The GeneXpert MRSA Assay (FDA approved for NASAL specimens only), is one component of a comprehensive MRSA colonization surveillance program. It is not intended to diagnose MRSA infection nor to guide or monitor treatment for MRSA infections.   Glucose, capillary      Status: Abnormal   Collection Time: 02/14/14  1:18 AM  Result Value Ref Range   Glucose-Capillary 115 (H) 70 - 99 mg/dL  Troponin I (q 6hr x 3)     Status: Abnormal   Collection Time: 02/14/14  1:32 AM  Result Value Ref Range   Troponin I 0.13 (H) <0.031 ng/mL    Comment:        PERSISTENTLY INCREASED TROPONIN VALUES IN THE RANGE OF 0.04-0.49 ng/mL CAN BE SEEN IN:       -UNSTABLE ANGINA       -CONGESTIVE HEART FAILURE       -MYOCARDITIS       -CHEST TRAUMA       -ARRYHTHMIAS       -LATE PRESENTING MYOCARDIAL INFARCTION       -COPD   CLINICAL FOLLOW-UP RECOMMENDED. Please note change in reference range.   Troponin I (q 6hr x 3)     Status: Abnormal   Collection Time: 02/14/14  7:22 AM  Result Value Ref Range   Troponin I 0.14 (H) <0.031 ng/mL    Comment:        PERSISTENTLY INCREASED TROPONIN VALUES IN THE RANGE OF 0.04-0.49 ng/mL CAN BE SEEN IN:       -UNSTABLE ANGINA       -CONGESTIVE HEART FAILURE       -MYOCARDITIS       -CHEST TRAUMA       -ARRYHTHMIAS       -LATE PRESENTING MYOCARDIAL INFARCTION       -COPD   CLINICAL FOLLOW-UP RECOMMENDED. Please note change in reference range.   Comprehensive metabolic panel     Status: Abnormal   Collection Time: 02/14/14  7:22 AM  Result Value Ref Range   Sodium 140 135 - 145 mmol/L    Comment: Please note change in reference range.   Potassium 3.8 3.5 - 5.1 mmol/L    Comment: Please note change in reference range.   Chloride 104 96 - 112 mEq/L   CO2 29 19 - 32 mmol/L   Glucose, Bld 102 (H) 70 - 99 mg/dL   BUN 18 6 - 23 mg/dL   Creatinine, Ser  1.24 0.50 - 1.35 mg/dL   Calcium 9.3 8.4 - 10.5 mg/dL   Total Protein 6.3 6.0 - 8.3 g/dL   Albumin 3.7 3.5 - 5.2 g/dL   AST 19 0 - 37 U/L   ALT 12 0 - 53 U/L   Alkaline Phosphatase 76 39 - 117 U/L   Total Bilirubin 0.6 0.3 - 1.2 mg/dL   GFR calc non Af Amer 49 (L) >90 mL/min   GFR calc Af Amer 57 (L) >90 mL/min    Comment: (NOTE) The eGFR has been calculated using the CKD  EPI equation. This calculation has not been validated in all clinical situations. eGFR's persistently <90 mL/min signify possible Chronic Kidney Disease.    Anion gap 7 5 - 15  CBC with Differential     Status: Abnormal   Collection Time: 02/14/14  7:22 AM  Result Value Ref Range   WBC 3.7 (L) 4.0 - 10.5 K/uL   RBC 3.69 (L) 4.22 - 5.81 MIL/uL   Hemoglobin 12.2 (L) 13.0 - 17.0 g/dL   HCT 37.0 (L) 39.0 - 52.0 %   MCV 100.3 (H) 78.0 - 100.0 fL   MCH 33.1 26.0 - 34.0 pg   MCHC 33.0 30.0 - 36.0 g/dL   RDW 14.0 11.5 - 15.5 %   Platelets 136 (L) 150 - 400 K/uL   Neutrophils Relative % 27 (L) 43 - 77 %   Lymphocytes Relative 60 (H) 12 - 46 %   Monocytes Relative 13 (H) 3 - 12 %   Eosinophils Relative 0 0 - 5 %   Basophils Relative 0 0 - 1 %   Neutro Abs 1.0 (L) 1.7 - 7.7 K/uL   Lymphs Abs 2.2 0.7 - 4.0 K/uL   Monocytes Absolute 0.5 0.1 - 1.0 K/uL   Eosinophils Absolute 0.0 0.0 - 0.7 K/uL   Basophils Absolute 0.0 0.0 - 0.1 K/uL   RBC Morphology POLYCHROMASIA PRESENT   Heparin level (unfractionated)     Status: Abnormal   Collection Time: 02/14/14  7:22 AM  Result Value Ref Range   Heparin Unfractionated 1.05 (H) 0.30 - 0.70 IU/mL    Comment:        IF HEPARIN RESULTS ARE BELOW EXPECTED VALUES, AND PATIENT DOSAGE HAS BEEN CONFIRMED, SUGGEST FOLLOW UP TESTING OF ANTITHROMBIN III LEVELS.   Troponin I (q 6hr x 3)     Status: Abnormal   Collection Time: 02/14/14 12:20 PM  Result Value Ref Range   Troponin I 0.14 (H) <0.031 ng/mL    Comment:        PERSISTENTLY INCREASED TROPONIN VALUES IN THE RANGE OF 0.04-0.49 ng/mL CAN BE SEEN IN:       -UNSTABLE ANGINA       -CONGESTIVE HEART FAILURE       -MYOCARDITIS       -CHEST TRAUMA       -ARRYHTHMIAS       -LATE PRESENTING MYOCARDIAL INFARCTION       -COPD   CLINICAL FOLLOW-UP RECOMMENDED. Please note change in reference range.   TSH     Status: Abnormal   Collection Time: 02/14/14 12:20 PM  Result Value Ref Range   TSH 6.652  (H) 0.350 - 4.500 uIU/mL  T4, free     Status: None   Collection Time: 02/14/14 12:20 PM  Result Value Ref Range   Free T4 0.84 0.80 - 1.80 ng/dL    Comment: Performed at [facility]  Magnesium     Status:  None   Collection Time: 02/14/14 12:20 PM  Result Value Ref Range   Magnesium 2.0 1.5 - 2.5 mg/dL  CBC     Status: Abnormal   Collection Time: 02/15/14  3:05 AM  Result Value Ref Range   WBC 3.2 (L) 4.0 - 10.5 K/uL   RBC 3.64 (L) 4.22 - 5.81 MIL/uL   Hemoglobin 11.4 (L) 13.0 - 17.0 g/dL   HCT 35.9 (L) 39.0 - 52.0 %   MCV 98.6 78.0 - 100.0 fL   MCH 31.3 26.0 - 34.0 pg   MCHC 31.8 30.0 - 36.0 g/dL   RDW 14.0 11.5 - 15.5 %   Platelets 130 (L) 150 - 400 K/uL    Imaging: Imaging results have been reviewed  Assessment/Plan:   1. Active Problems: 2.   Dementia 3.   Syncope 4.   Chest pain 5.   Pulmonary embolism 6.   Chronic atrial fibrillation 7.   CAD (coronary artery disease) 8.   Orthostatic hypotension 9.   Pulmonary hypertension 10.   Bradycardia 11.   RBBB with left anterior fascicular block 12.   Chronic combined systolic and diastolic CHF (congestive heart failure) 13.   Hypercholesterolemia 14.   Pulmonary nodules 15.   CKD (chronic kidney disease), stage III 16.   NSVT (nonsustained ventricular tachycardia) 17.   Time Spent Directly with Patient:  15 minutes  Length of Stay:  LOS: 2 days   Pt going to be Rx with SQ lovenox BID for his recurrent PE and CAF. No Sx. 2D pending. No further recommendations. Will S/O. Call with further questions.  Lorretta Harp 02/15/2014, 12:47 PM

## 2014-02-15 NOTE — Progress Notes (Signed)
*  PRELIMINARY RESULTS* Echocardiogram 2D Echocardiogram has been performed.  Bryce Mcdonald 02/15/2014, 10:16 AM

## 2014-02-15 NOTE — Progress Notes (Signed)
[facility] PROGRESS NOTE  Ervan Heber IOE:[id number] DOB: 16-Aug-1921 DOA: 02/13/2014 PCP: PROVIDER NOT IN SYSTEM  Assessment/Plan: Active Problems:   Dementia - stable    Syncope - currently undergoing work up - suspect most likely 2ary to new diagnosis of PE    Pulmonary embolism - consulted hematologist for further evaluation - Place back on Lovenox per pharmacy consult  Hypothyroidism - Stable continue Synthroid regimen  Elevated Troponin - Suspected to be positive secondary problem PE - Patient does not report chest pain  Code Status: full Family Communication: no family at bedside  Disposition Plan: Pending improvement in condition   Consultants:  Cardiology  Oncology/hematology  Procedures:  echocardiogram  Antibiotics:  None  HPI/Subjective: Pt has no new complaints.  Objective: Filed Vitals:   02/15/14 0756  BP:   Pulse:   Temp: 97.5 F (36.4 C)  Resp:     Intake/Output Summary (Last 24 hours) at 02/15/14 1221 Last data filed at 02/15/14 1000  Gross per 24 hour  Intake   1040 ml  Output   1550 ml  Net   -510 ml   Filed Weights   02/13/14 1801 02/14/14 0023  Weight: 79.379 kg (175 lb) 81.7 kg (180 lb 1.9 oz)    Exam:   General:  Pt in nad, alert and awake  Cardiovascular: s1 and s2 present, no rubs  Respiratory: no increased wob, cta bl, no wheezes  Abdomen: soft, ND, ND  Musculoskeletal: no cyanosis or clubbing   Data Reviewed: Basic Metabolic Panel:  Recent Labs Lab 02/13/14 1937 02/14/14 0722 02/14/14 1220  NA 139 140  --   K 3.7 3.8  --   CL 103 104  --   CO2 26 29  --   GLUCOSE 130* 102*  --   BUN 24* 18  --   CREATININE 1.29 1.24  --   CALCIUM 9.3 9.3  --   MG  --   --  2.0   Liver Function Tests:  Recent Labs Lab 02/13/14 1937 02/14/14 0722  AST 22 19  ALT 15 12  ALKPHOS 82 76  BILITOT 0.3 0.6  PROT 7.3 6.3  ALBUMIN 4.1 3.7   No results for input(s): LIPASE, AMYLASE in the last  168 hours. No results for input(s): AMMONIA in the last 168 hours. CBC:  Recent Labs Lab 02/13/14 1937 02/14/14 0722 02/15/14 0305  WBC 3.7* 3.7* 3.2*  NEUTROABS 0.9* 1.0*  --   HGB 13.0 12.2* 11.4*  HCT 39.7 37.0* 35.9*  MCV 101.0* 100.3* 98.6  PLT 151 136* 130*   Cardiac Enzymes:  Recent Labs Lab 02/13/14 1937 02/14/14 0132 02/14/14 0722 02/14/14 1220  TROPONINI 0.12* 0.13* 0.14* 0.14*   BNP (last 3 results)  Recent Labs  07/26/13 1128 11/16/13 0839 01/10/14 1046  PROBNP 2036.0* 770.7* 1366.0*   CBG:  Recent Labs Lab 02/14/14 0118  GLUCAP 115*    Recent Results (from the past 240 hour(s))  MRSA PCR Screening     Status: None   Collection Time: 02/14/14  1:07 AM  Result Value Ref Range Status   MRSA by PCR NEGATIVE NEGATIVE Final    Comment:        The GeneXpert MRSA Assay (FDA approved for NASAL specimens only), is one component of a comprehensive MRSA colonization surveillance program. It is not intended to diagnose MRSA infection nor to guide or monitor treatment for MRSA infections.      Studies: Dg Chest 1 View  02/13/2014  CLINICAL DATA:  Right facial paresthesias, right neck pain, coronary disease, atrial fibrillation  EXAM: CHEST - 1 VIEW  COMPARISON:  01/10/2014  FINDINGS: Background COPD/ emphysema with parenchymal scarring in the right lower lobe as before. Normal heart size and vascularity. Atherosclerosis of the aorta noted. No superimposed edema, pneumonia, collapse or consolidation. No enlarging effusion or pneumothorax. Trachea midline. Atherosclerosis noted of the aorta. Stable exam.  IMPRESSION: Stable COPD/emphysema with right lower lobe parenchymal scarring.   Electronically Signed   By: Daryll Brod M.D.   On: 02/13/2014 19:22   Ct Head Wo Contrast  02/13/2014   CLINICAL DATA:  Acute syncopal episode  EXAM: CT HEAD WITHOUT CONTRAST  TECHNIQUE: Contiguous axial images were obtained from the base of the skull through the vertex  without contrast.  COMPARISON:  11/16/2013  FINDINGS: Limited with some motion artifact. Stable brain atrophy pattern with chronic white matter microvascular ischemic changes. No acute intracranial hemorrhage, mass lesion, new infarction, midline shift, herniation, hydrocephalus, or extra-axial fluid collection. No focal mass effect or edema. Patent cisterns. Cerebellar atrophy as well. Orbits are symmetric. Mastoids and sinuses are clear. Atherosclerosis of the intracranial vessels at the skull base. Skull appears intact.  IMPRESSION: Stable atrophy and chronic white matter ischemic changes. No interval change or acute process by noncontrast CT.   Electronically Signed   By: Daryll Brod M.D.   On: 02/13/2014 19:12   Ct Angio Chest Aorta W/cm &/or Wo/cm  02/13/2014   CLINICAL DATA:  Cough, weakness, shortness of breath, altered mental status. Symptoms began this morning, syncope. RIGHT facial numbness for 2 days. Hallucinations.  EXAM: CT ANGIOGRAPHY CHEST WITH CONTRAST  TECHNIQUE: Multidetector CT imaging of the chest was performed using the standard protocol during bolus administration of intravenous contrast. Multiplanar CT image reconstructions and MIPs were obtained to evaluate the vascular anatomy.  CONTRAST:  127mL OMNIPAQUE IOHEXOL 350 MG/ML SOLN  COMPARISON:  Chest radiograph February 13, 2014 at 1857 hr ; CT angiogram of the chest October 10, 2012.  FINDINGS: Adequate pulmonary arterial contrast opacification. Main pulmonary artery is not enlarged. Nonocclusive RIGHT lower lobe segmental pulmonary embolus, axial 182/291. A tiny vein courses from the LEFT subclavian vein towards the inferior aspect of the heart, suggesting duplicated superior vena cava, congenital variant.  The heart size is upper limits of normal. RIGHT heart strain (RV/LV is 1). Small pericardial effusion. Thoracic aorta is normal in course and caliber with moderate calcific atherosclerosis. No lymphadenopathy by CT size criteria.  Subcentimeter pretracheal, subcarinal lymph nodes.  Lung base atelectasis/scarring, unchanged. Stable scattered pulmonary nodules, measuring up to 6 mm in RIGHT middle lobe.  Included view of the abdomen is not acute; splenic granulomas. Osseous structures are nonsuspicious. Similar degenerative change of the thoracic spine.  Review of the MIP images confirms the above findings.  IMPRESSION: Nonocclusive RIGHT lower lobe segmental pulmonary embolus appears acute. CT evidence of right heart strain appears unchanged from prior imaging. (RV/LV Ratio = Positive for acute PE with CTevidence of right heart strain (RV/LV Ratio = 1) consistent with at least submassive (intermediate risk) PE. The presence of right heart strain has been associated with an increased risk of morbidity and mortality.) consistent with at least submassive (intermediate risk) PE. The presence of right heart strain has been associated with an increased risk of morbidity and mortality.  Stable pulmonary nodules.  Acute findings discussed with and reconfirmed by Dr.Pickerington on 02/13/2014 at 10:26 pm.   Electronically Signed   By: Elon Alas  On: 02/13/2014 22:28    Scheduled Meds: . aspirin EC  325 mg Oral Daily  . atorvastatin  80 mg Oral Daily  . enoxaparin (LOVENOX) injection  1 mg/kg Subcutaneous Q12H  . famotidine  20 mg Oral BID  . levothyroxine  50 mcg Oral QAC breakfast  . vitamin B-12  1,000 mcg Oral Daily   Continuous Infusions: . sodium chloride 10 mL/hr at 02/15/14 0600    Time spent: > 35 minutes    Velvet Bathe  [facility] Pager [phone]  If 7PM-7AM, please contact night-coverage at [email or web address], password [email or web address] 02/15/2014, 12:21 PM  LOS: 2 days

## 2014-02-16 DIAGNOSIS — I4891 Unspecified atrial fibrillation: Secondary | ICD-10-CM

## 2014-02-16 DIAGNOSIS — N183 Chronic kidney disease, stage 3 (moderate): Secondary | ICD-10-CM

## 2014-02-16 DIAGNOSIS — R079 Chest pain, unspecified: Secondary | ICD-10-CM

## 2014-02-16 LAB — FACTOR 10 ASSAY: FACTOR X ACTIVITY: 84 % (ref 72–134)

## 2014-02-16 LAB — URINALYSIS, ROUTINE W REFLEX MICROSCOPIC
BILIRUBIN URINE: NEGATIVE
GLUCOSE, UA: NEGATIVE mg/dL
Ketones, ur: NEGATIVE mg/dL
Leukocytes, UA: NEGATIVE
Nitrite: NEGATIVE
PROTEIN: NEGATIVE mg/dL
Specific Gravity, Urine: 1.008 (ref 1.005–1.030)
UROBILINOGEN UA: 0.2 mg/dL (ref 0.0–1.0)
pH: 6 (ref 5.0–8.0)

## 2014-02-16 LAB — CBC
HCT: 35.8 % — ABNORMAL LOW (ref 39.0–52.0)
Hemoglobin: 11.7 g/dL — ABNORMAL LOW (ref 13.0–17.0)
MCH: 32.3 pg (ref 26.0–34.0)
MCHC: 32.7 g/dL (ref 30.0–36.0)
MCV: 98.9 fL (ref 78.0–100.0)
Platelets: 121 10*3/uL — ABNORMAL LOW (ref 150–400)
RBC: 3.62 MIL/uL — AB (ref 4.22–5.81)
RDW: 14.1 % (ref 11.5–15.5)
WBC: 3.6 10*3/uL — ABNORMAL LOW (ref 4.0–10.5)

## 2014-02-16 LAB — URINE MICROSCOPIC-ADD ON

## 2014-02-16 MED ORDER — ALBUTEROL SULFATE (2.5 MG/3ML) 0.083% IN NEBU
3.0000 mL | INHALATION_SOLUTION | RESPIRATORY_TRACT | Status: DC | PRN
  Administered 2014-02-16: 3 mL via RESPIRATORY_TRACT
  Filled 2014-02-16: qty 3

## 2014-02-16 MED ORDER — METOPROLOL TARTRATE 12.5 MG HALF TABLET
12.5000 mg | ORAL_TABLET | Freq: Two times a day (BID) | ORAL | Status: DC
  Administered 2014-02-16 – 2014-02-17 (×3): 12.5 mg via ORAL
  Filled 2014-02-16 (×4): qty 1

## 2014-02-16 MED ORDER — PANTOPRAZOLE SODIUM 40 MG PO TBEC
40.0000 mg | DELAYED_RELEASE_TABLET | Freq: Every day | ORAL | Status: DC
  Administered 2014-02-17: 40 mg via ORAL

## 2014-02-16 MED ORDER — ACETAMINOPHEN 325 MG PO TABS
650.0000 mg | ORAL_TABLET | ORAL | Status: DC | PRN
  Administered 2014-02-16: 650 mg via ORAL
  Filled 2014-02-16 (×2): qty 2

## 2014-02-16 MED ORDER — ASPIRIN EC 81 MG PO TBEC
81.0000 mg | DELAYED_RELEASE_TABLET | Freq: Every day | ORAL | Status: DC
  Administered 2014-02-17: 81 mg via ORAL
  Filled 2014-02-16: qty 1

## 2014-02-16 NOTE — Clinical Social Work Note (Signed)
CSW Consult Acknowledged:   CSW received consult for SNF placement. CSW is awaiting PT/OT evaluation. CSW will follow.   [place], MSW, [place]

## 2014-02-16 NOTE — Progress Notes (Signed)
Bryce Mcdonald  Bryce Mcdonald WIO:[id number] DOB: 04/11/1921 DOA: 02/13/2014 PCP: PROVIDER NOT IN SYSTEM  Brief narrative: 80 year old male patient with history of PE on Lovenox prior to admission, chronic A. fib, and chronic mild systolic heart failure who was brought to the ER after a syncopal spell.  In the ER troponin mildly elevated and EKG was nonischemic. CT chest demonstrated new nonocclusive PE. Because of troponin elevation cardiology was consulted. Patient was subsequently transferred to [facility] because of abnormal troponin. Patient did tell the admitting physician that about 4 weeks prior he missed some of his Lovenox injections.  Since admission he has been started on every 12 hour full dose Lovenox. He's been evaluated by cardiology who feels abnormal troponin was related to the PE and no indication to pursue additional ischemic evaluation. Echocardiogram without evidence of RV strain. Since admission patient's platelets have been decreasing without explanation.  HPI/Subjective: Alert and sitting on side of bed. Primarily complaining of right neck pain radiating to left shoulder blade. Worse with breathing.  Assessment/Plan:    Dementia -At baseline    Syncope -Suspect related to acute PE-cardiology has signed off    Chest pain/CAD  -Pleuritic and related to PE    Recurrent Pulmonary embolism -Now on twice daily Lovenox noting was on 100 mg daily at nursing facility-apparently had missed some doses in the past 4 weeks-will discharge on twice a day dosing   Thrombocytopenia/neutropenia -Baseline platelets between 140 and 160,000-current trend downward towards 120,000 range-unclear if related to higher dosage of Lovenox-if continues to decrease may need to check HIT panel-possibly due to gram-negative infection so we'll check urinalysis and culture-relative neutrophils as well as absolute neutrophils are low which appears to  be chronic    Chronic atrial fibrillation -Has been on Coumadin in the past but was transitioned to Xarelto in July but then back on Lovenox by October 2015-currently rate controlled   Hypothyroidism -continue Synthroid    Pulmonary hypertension -60 mmHg June 2015    Tachycardia -Resume home Lopressor    Chronic combined systolic and diastolic CHF  -Compensated-EF on echo this admission up to 50-55%    CKD (chronic kidney disease), stage III -Stable and at baseline    DVT prophylaxis: full dose Lovenox Code Status: Full Family Communication: No family at bedside Disposition Plan/Expected LOS: Transfer to telemetry   Consultants: Cardiology/Dr. Gwenlyn Found  Procedures: 2D echocardiogram: - Left ventricle: The cavity size was normal. Wall thickness was increased in a pattern of moderate LVH. Systolic function was normal. The estimated ejection fraction was in the range of 50% to 55%. Wall motion was normal; there were no regional wall motion abnormalities. Doppler parameters are consistent with high ventricular filling pressure. - Mitral valve: There was mild regurgitation. - Left atrium: The atrium was moderately dilated. - Right ventricle: The cavity size was normal. Wall thickness was mildly increased. Systolic function was mildly reduced. - Right atrium: The atrium was mildly dilated.  Antibiotics: None  Objective: Blood pressure 120/83, pulse 133, temperature 98 F (36.7 C), temperature source Oral, resp. rate 25, height 5\' 7"  (1.702 m), weight 180 lb 1.9 oz (81.7 kg), SpO2 97 %.  Intake/Output Summary (Last 24 hours) at 02/16/14 1402 Last data filed at 02/16/14 1300  Gross per 24 hour  Intake    210 ml  Output   1325 ml  Net  -1115 ml   Exam: Gen: No acute respiratory distress Chest: Clear to  auscultation bilaterally without wheezes or crackles, room air Cardiac: Irregular rate and rhythm, S1-S2, no rubs murmurs or gallops, no peripheral edema,  no JVD Abdomen: Soft nontender nondistended without obvious hepatosplenomegaly, no ascites Extremities: Symmetrical in appearance without cyanosis, clubbing or effusion  Scheduled Meds:  Scheduled Meds: . aspirin EC  325 mg Oral Daily  . atorvastatin  80 mg Oral Daily  . enoxaparin (LOVENOX) injection  1 mg/kg Subcutaneous Q12H  . famotidine  20 mg Oral BID  . levothyroxine  50 mcg Oral QAC breakfast  . vitamin B-12  1,000 mcg Oral Daily    Data Reviewed: Basic Metabolic Panel:  Recent Labs Lab 02/13/14 1937 02/14/14 0722 02/14/14 1220  NA 139 140  --   K 3.7 3.8  --   CL 103 104  --   CO2 26 29  --   GLUCOSE 130* 102*  --   BUN 24* 18  --   CREATININE 1.29 1.24  --   CALCIUM 9.3 9.3  --   MG  --   --  2.0   Liver Function Tests:  Recent Labs Lab 02/13/14 1937 02/14/14 0722  AST 22 19  ALT 15 12  ALKPHOS 82 76  BILITOT 0.3 0.6  PROT 7.3 6.3  ALBUMIN 4.1 3.7   CBC:  Recent Labs Lab 02/13/14 1937 02/14/14 0722 02/15/14 0305 02/16/14 0301  WBC 3.7* 3.7* 3.2* 3.6*  NEUTROABS 0.9* 1.0*  --   --   HGB 13.0 12.2* 11.4* 11.7*  HCT 39.7 37.0* 35.9* 35.8*  MCV 101.0* 100.3* 98.6 98.9  PLT 151 136* 130* 121*   Cardiac Enzymes:  Recent Labs Lab 02/13/14 1937 02/14/14 0132 02/14/14 0722 02/14/14 1220  TROPONINI 0.12* 0.13* 0.14* 0.14*   BNP (last 3 results)  Recent Labs  07/26/13 1128 11/16/13 0839 01/10/14 1046  PROBNP 2036.0* 770.7* 1366.0*   CBG:  Recent Labs Lab 02/14/14 0118  GLUCAP 115*    Recent Results (from the past 240 hour(s))  MRSA PCR Screening     Status: None   Collection Time: 02/14/14  1:07 AM  Result Value Ref Range Status   MRSA by PCR NEGATIVE NEGATIVE Final    Comment:        The GeneXpert MRSA Assay (FDA approved for NASAL specimens only), is one component of a comprehensive MRSA colonization surveillance program. It is not intended to diagnose MRSA infection nor to guide or monitor treatment for MRSA  infections.      Studies:  Recent x-ray studies have been reviewed in detail by the Attending Physician  Time spent :  [place], ANP [facility] Office  [phone] Pager [phone]  On-Call/Text Page:      [email or web address]      password [email or web address]  If 7PM-7AM, please contact night-coverage [email or web address] Password [email or web address] 02/16/2014, 2:02 PM   LOS: 3 days   I have personally examined this patient and reviewed the entire database. I have reviewed the above Mcdonald, made any necessary editorial changes, and agree with its content.  Cherene Altes, MD [facility]

## 2014-02-16 NOTE — Progress Notes (Signed)
HEMATOLOGY: S: No new complaints Filed Vitals:   02/16/14 1649  BP:   Pulse:   Temp: 97.8 F (36.6 C)  Resp:    Exam: Not done  Assessment and Plan: Rec PE: on once daily lovenox. I asked for Anti-X a levels but the lab performed factor X assay instead. So it is not clear whether he was sub therapeutic on lovenox prior to the change in dosage to 1 mg/kg [place] bid.  Recommendation:  Lovenox bid dosing Will sign off.

## 2014-02-17 LAB — URINE CULTURE
COLONY COUNT: NO GROWTH
CULTURE: NO GROWTH

## 2014-02-17 LAB — CBC
HEMATOCRIT: 35.7 % — AB (ref 39.0–52.0)
Hemoglobin: 11.7 g/dL — ABNORMAL LOW (ref 13.0–17.0)
MCH: 33.5 pg (ref 26.0–34.0)
MCHC: 32.8 g/dL (ref 30.0–36.0)
MCV: 102.3 fL — AB (ref 78.0–100.0)
PLATELETS: 123 10*3/uL — AB (ref 150–400)
RBC: 3.49 MIL/uL — AB (ref 4.22–5.81)
RDW: 14.3 % (ref 11.5–15.5)
WBC: 4 10*3/uL (ref 4.0–10.5)

## 2014-02-17 LAB — PATHOLOGIST SMEAR REVIEW

## 2014-02-17 MED ORDER — ENOXAPARIN SODIUM 80 MG/0.8ML ~~LOC~~ SOLN: 1.0000 mg/kg | Freq: Two times a day (BID) | SUBCUTANEOUS | Status: DC

## 2014-02-17 NOTE — Discharge Summary (Cosign Needed)
Physician Discharge Summary  Bryce Mcdonald RXV:[id number] DOB: [birth year] DOA: 02/13/2014  PCP: PROVIDER NOT IN SYSTEM  Admit date: 02/13/2014 Discharge date: 02/17/2014  Time spent: greater than 30 minutes  Recommendations for Outpatient Follow-up:  1. Home pt  Discharge Diagnoses:  Primary problem:   Pulmonary embolism Active Problems:   Dementia   Syncope   Chest pain   Chronic atrial fibrillation   CAD (coronary artery disease)   Orthostatic hypotension   Pulmonary hypertension   Bradycardia   RBBB with left anterior fascicular block   Chronic combined systolic and diastolic CHF (congestive heart failure)   Hypercholesterolemia   Pulmonary nodules   CKD (chronic kidney disease), stage III   NSVT (nonsustained ventricular tachycardia)  Discharge Condition: stable  Filed Weights   02/13/14 1801 02/14/14 0023 02/16/14 2100  Weight: 79.379 kg (175 lb) 81.7 kg (180 lb 1.9 oz) 83.598 kg (184 lb 4.8 oz)    History of present illness/Hospital Course:  80 year old male patient with history of PE on Lovenox prior to admission, chronic A. fib, and chronic mild systolic heart failure who was brought to the ER after a syncopal spell. In the ER troponin mildly elevated and EKG was nonischemic. CT chest demonstrated new nonocclusive PE. Because of troponin elevation cardiology was consulted. Patient was subsequently transferred to [facility] because of abnormal troponin. Patient did tell the admitting physician that about 4 weeks prior he missed some of his Lovenox injections.  Since admission he has been started on every 12 hour full dose Lovenox. He's been evaluated by cardiology who feels abnormal troponin was related to the PE and no indication to pursue additional ischemic evaluation. Echocardiogram without evidence of RV strain. Hematology consulted and recommend bid lovenox dosing   Dementia -At baseline   Syncope -Suspect related to acute PE-cardiology has  signed off   Recurrent Pulmonary embolism -Now on twice daily Lovenox noting was on 100 mg daily at nursing facility-apparently had missed some doses in the past 4 weeks-will discharge on twice a day dosing   Chronic atrial fibrillation -Has been on Coumadin in the past but was transitioned to Xarelto in July but then back on Lovenox by October 2015-currently rate controlled   Chronic combined systolic and diastolic CHF  -Compensated-EF on echo this admission up to 50-55%   CKD (chronic kidney disease), stage III -Stable and at baseline  Consultants: Cardiology hematology  Procedures: 2D echocardiogram: - Left ventricle: The cavity size was normal. Wall thickness was increased in a pattern of moderate LVH. Systolic function was normal. The estimated ejection fraction was in the range of 50% to 55%. Wall motion was normal; there were no regional wall motion abnormalities. Doppler parameters are consistent with high ventricular filling pressure. - Mitral valve: There was mild regurgitation. - Left atrium: The atrium was moderately dilated. - Right ventricle: The cavity size was normal. Wall thickness was mildly increased. Systolic function was mildly reduced. - Right atrium: The atrium was mildly dilated.  Discharge Exam: Filed Vitals:   02/17/14 1100  BP: 128/99  Pulse:   Temp:   Resp:     General: comfortable Cardiovascular: RRR without mgr Respiratory: s, nt, nd   Discharge Instructions   Discharge Instructions    Activity as tolerated - No restrictions    Complete by:  As directed      Diet - low sodium heart healthy    Complete by:  As directed      Discharge instructions    Complete  by:  As directed   Stop metoprolol. Change lovenox to 80 mg injection twice daily          Current Discharge Medication List    CONTINUE these medications which have CHANGED   Details  enoxaparin (LOVENOX) 80 MG/0.8ML injection Inject 0.8 mLs (80 mg total)  into the skin every 12 (twelve) hours. Qty: 60 Syringe, Refills: 1      CONTINUE these medications which have NOT CHANGED   Details  acetaminophen (TYLENOL) 325 MG tablet Take 650 mg by mouth every 6 (six) hours as needed for mild pain or fever.    albuterol (PROVENTIL HFA) 108 (90 BASE) MCG/ACT inhaler Inhale 2 puffs into the lungs every 6 (six) hours as needed for wheezing or shortness of breath.    aspirin 81 MG chewable tablet Chew 81 mg by mouth daily.    atorvastatin (LIPITOR) 80 MG tablet Take 80 mg by mouth daily.    famotidine (PEPCID) 20 MG tablet Take 1 tablet (20 mg total) by mouth 2 (two) times daily.    furosemide (LASIX) 20 MG tablet Take 1 tablet (20 mg total) by mouth daily as needed for fluid or edema. Qty: 90 tablet, Refills: 3   Associated Diagnoses: Diarrhea    levothyroxine (SYNTHROID, LEVOTHROID) 50 MCG tablet Take 50 mcg by mouth daily before breakfast.    vitamin B-12 (CYANOCOBALAMIN) 1000 MCG tablet Take 1,000 mcg by mouth daily.      STOP taking these medications     metoprolol tartrate (LOPRESSOR) 25 MG tablet      predniSONE (DELTASONE) 20 MG tablet        Allergies  Allergen Reactions  . Codeine Shortness Of Breath    Shortness of breath  . Penicillins Shortness Of Breath  . Morphine And Related Itching   Follow-up Information    Follow up with your doctor.   Why:  1-2 weeks       The results of significant diagnostics from this hospitalization (including imaging, microbiology, ancillary and laboratory) are listed below for reference.    Significant Diagnostic Studies: Dg Chest 1 View  02/13/2014   CLINICAL DATA:  Right facial paresthesias, right neck pain, coronary disease, atrial fibrillation  EXAM: CHEST - 1 VIEW  COMPARISON:  01/10/2014  FINDINGS: Background COPD/ emphysema with parenchymal scarring in the right lower lobe as before. Normal heart size and vascularity. Atherosclerosis of the aorta noted. No superimposed edema,  pneumonia, collapse or consolidation. No enlarging effusion or pneumothorax. Trachea midline. Atherosclerosis noted of the aorta. Stable exam.  IMPRESSION: Stable COPD/emphysema with right lower lobe parenchymal scarring.   Electronically Signed   By: Daryll Brod M.D.   On: 02/13/2014 19:22   Ct Head Wo Contrast  02/13/2014   CLINICAL DATA:  Acute syncopal episode  EXAM: CT HEAD WITHOUT CONTRAST  TECHNIQUE: Contiguous axial images were obtained from the base of the skull through the vertex without contrast.  COMPARISON:  11/16/2013  FINDINGS: Limited with some motion artifact. Stable brain atrophy pattern with chronic white matter microvascular ischemic changes. No acute intracranial hemorrhage, mass lesion, new infarction, midline shift, herniation, hydrocephalus, or extra-axial fluid collection. No focal mass effect or edema. Patent cisterns. Cerebellar atrophy as well. Orbits are symmetric. Mastoids and sinuses are clear. Atherosclerosis of the intracranial vessels at the skull base. Skull appears intact.  IMPRESSION: Stable atrophy and chronic white matter ischemic changes. No interval change or acute process by noncontrast CT.   Electronically Signed   By: Tomasita Crumble  Shick M.D.   On: 02/13/2014 19:12   Ct Angio Chest Aorta W/cm &/or Wo/cm  02/13/2014   CLINICAL DATA:  Cough, weakness, shortness of breath, altered mental status. Symptoms began this morning, syncope. RIGHT facial numbness for 2 days. Hallucinations.  EXAM: CT ANGIOGRAPHY CHEST WITH CONTRAST  TECHNIQUE: Multidetector CT imaging of the chest was performed using the standard protocol during bolus administration of intravenous contrast. Multiplanar CT image reconstructions and MIPs were obtained to evaluate the vascular anatomy.  CONTRAST:  115mL OMNIPAQUE IOHEXOL 350 MG/ML SOLN  COMPARISON:  Chest radiograph February 13, 2014 at 1857 hr ; CT angiogram of the chest October 10, 2012.  FINDINGS: Adequate pulmonary arterial contrast opacification.  Main pulmonary artery is not enlarged. Nonocclusive RIGHT lower lobe segmental pulmonary embolus, axial 182/291. A tiny vein courses from the LEFT subclavian vein towards the inferior aspect of the heart, suggesting duplicated superior vena cava, congenital variant.  The heart size is upper limits of normal. RIGHT heart strain (RV/LV is 1). Small pericardial effusion. Thoracic aorta is normal in course and caliber with moderate calcific atherosclerosis. No lymphadenopathy by CT size criteria. Subcentimeter pretracheal, subcarinal lymph nodes.  Lung base atelectasis/scarring, unchanged. Stable scattered pulmonary nodules, measuring up to 6 mm in RIGHT middle lobe.  Included view of the abdomen is not acute; splenic granulomas. Osseous structures are nonsuspicious. Similar degenerative change of the thoracic spine.  Review of the MIP images confirms the above findings.  IMPRESSION: Nonocclusive RIGHT lower lobe segmental pulmonary embolus appears acute. CT evidence of right heart strain appears unchanged from prior imaging. (RV/LV Ratio = Positive for acute PE with CTevidence of right heart strain (RV/LV Ratio = 1) consistent with at least submassive (intermediate risk) PE. The presence of right heart strain has been associated with an increased risk of morbidity and mortality.) consistent with at least submassive (intermediate risk) PE. The presence of right heart strain has been associated with an increased risk of morbidity and mortality.  Stable pulmonary nodules.  Acute findings discussed with and reconfirmed by Dr.Pickerington on 02/13/2014 at 10:26 pm.   Electronically Signed   By: Elon Alas   On: 02/13/2014 22:28    Microbiology: Recent Results (from the past 240 hour(s))  MRSA PCR Screening     Status: None   Collection Time: 02/14/14  1:07 AM  Result Value Ref Range Status   MRSA by PCR NEGATIVE NEGATIVE Final    Comment:        The GeneXpert MRSA Assay (FDA approved for NASAL  specimens only), is one component of a comprehensive MRSA colonization surveillance program. It is not intended to diagnose MRSA infection nor to guide or monitor treatment for MRSA infections.      Labs: Basic Metabolic Panel:  Recent Labs Lab 02/13/14 1937 02/14/14 0722 02/14/14 1220  NA 139 140  --   K 3.7 3.8  --   CL 103 104  --   CO2 26 29  --   GLUCOSE 130* 102*  --   BUN 24* 18  --   CREATININE 1.29 1.24  --   CALCIUM 9.3 9.3  --   MG  --   --  2.0   Liver Function Tests:  Recent Labs Lab 02/13/14 1937 02/14/14 0722  AST 22 19  ALT 15 12  ALKPHOS 82 76  BILITOT 0.3 0.6  PROT 7.3 6.3  ALBUMIN 4.1 3.7   No results for input(s): LIPASE, AMYLASE in the last 168 hours. No results for  input(s): AMMONIA in the last 168 hours. CBC:  Recent Labs Lab 02/13/14 1937 02/14/14 0722 02/15/14 0305 02/16/14 0301 02/17/14 0919  WBC 3.7* 3.7* 3.2* 3.6* 4.0  NEUTROABS 0.9* 1.0*  --   --   --   HGB 13.0 12.2* 11.4* 11.7* 11.7*  HCT 39.7 37.0* 35.9* 35.8* 35.7*  MCV 101.0* 100.3* 98.6 98.9 102.3*  PLT 151 136* 130* 121* 123*   Cardiac Enzymes:  Recent Labs Lab 02/13/14 1937 02/14/14 0132 02/14/14 0722 02/14/14 1220  TROPONINI 0.12* 0.13* 0.14* 0.14*   BNP: BNP (last 3 results)  Recent Labs  07/26/13 1128 11/16/13 0839 01/10/14 1046  PROBNP 2036.0* 770.7* 1366.0*   CBG:  Recent Labs Lab 02/14/14 0118  GLUCAP 115*       Signed:  [place] L  [facility] 02/17/2014, 2:05 PM

## 2014-02-17 NOTE — Progress Notes (Signed)
Pt discharged  Discharge instructions given & reviewed Education discussed  IV dc'd  Tele dc'd  Pt discharged via wheelchair with RN All pt belongs at side.  Sherrie Mustache 3:37 PM

## 2014-02-19 NOTE — Care Management Note (Signed)
    Page 1 of 1   02/19/2014     8:58:09 AM CARE MANAGEMENT NOTE 02/19/2014  Patient:  Bryce Mcdonald, Bryce Mcdonald   Account Number:  [id number]  Date Initiated:  02/17/2014  Documentation initiated by:  Nemesio Castrillon  Subjective/Objective Assessment:   dx syncope; lives alone but dtr assists with IADLs a needed, pt also has [place] aide 2 hrs/day, uses walker for ambulation.    Primary care @ [facility] consult      [place] arranged  HH-1 RN      [place]   Status of service:  Completed, signed off  Discharge Disposition:  [place]  Per UR Regulation:  Reviewed for med. necessity/level of care/duration of stay  Comments:  02/17/14 [place] of admission.  Discussed transfer to [facility] with pt and dtr, who both refused Margarita Grizzle @ [id number] will fax document for pt to sign. 1448 Pt to be discharged, pt will need home health RN - TC to [place] with [facility] [phone]) to determine contracted agency.  VM message left requesting return call with information. 1450 TC from [place] requesting TC to [employer] @ [phone] to determine contracted home health agency. VM message left. 70  TC from Vonna Kotyk requesting referral to [facility], pt and dtr agree.  Referral called, documentation faxed to [phone] as well as to [facility] per request.

## 2014-03-09 ENCOUNTER — Emergency Department (HOSPITAL_COMMUNITY): Payer: Non-veteran care

## 2014-03-09 ENCOUNTER — Emergency Department (HOSPITAL_COMMUNITY)
Admission: EM | Admit: 2014-03-09 | Discharge: 2014-03-09 | Disposition: A | Discharge status: Home / Self Care | Payer: Non-veteran care | Attending: Emergency Medicine | Admitting: Emergency Medicine

## 2014-03-09 ENCOUNTER — Encounter (HOSPITAL_COMMUNITY): Payer: Self-pay | Admitting: Emergency Medicine

## 2014-03-09 DIAGNOSIS — I129 Hypertensive chronic kidney disease with stage 1 through stage 4 chronic kidney disease, or unspecified chronic kidney disease: Secondary | ICD-10-CM | POA: Diagnosis not present

## 2014-03-09 DIAGNOSIS — I2510 Atherosclerotic heart disease of native coronary artery without angina pectoris: Secondary | ICD-10-CM | POA: Insufficient documentation

## 2014-03-09 DIAGNOSIS — N183 Chronic kidney disease, stage 3 (moderate): Secondary | ICD-10-CM | POA: Insufficient documentation

## 2014-03-09 DIAGNOSIS — Z86711 Personal history of pulmonary embolism: Secondary | ICD-10-CM | POA: Insufficient documentation

## 2014-03-09 DIAGNOSIS — Q6102 Congenital multiple renal cysts: Secondary | ICD-10-CM | POA: Diagnosis not present

## 2014-03-09 DIAGNOSIS — Z9889 Other specified postprocedural states: Secondary | ICD-10-CM | POA: Insufficient documentation

## 2014-03-09 DIAGNOSIS — Z79899 Other long term (current) drug therapy: Secondary | ICD-10-CM | POA: Diagnosis not present

## 2014-03-09 DIAGNOSIS — Z85038 Personal history of other malignant neoplasm of large intestine: Secondary | ICD-10-CM | POA: Diagnosis not present

## 2014-03-09 DIAGNOSIS — Z7982 Long term (current) use of aspirin: Secondary | ICD-10-CM | POA: Insufficient documentation

## 2014-03-09 DIAGNOSIS — Z86718 Personal history of other venous thrombosis and embolism: Secondary | ICD-10-CM | POA: Insufficient documentation

## 2014-03-09 DIAGNOSIS — F0390 Unspecified dementia without behavioral disturbance: Secondary | ICD-10-CM | POA: Diagnosis not present

## 2014-03-09 DIAGNOSIS — R0602 Shortness of breath: Secondary | ICD-10-CM | POA: Diagnosis present

## 2014-03-09 DIAGNOSIS — E039 Hypothyroidism, unspecified: Secondary | ICD-10-CM | POA: Diagnosis not present

## 2014-03-09 DIAGNOSIS — J441 Chronic obstructive pulmonary disease with (acute) exacerbation: Secondary | ICD-10-CM | POA: Diagnosis not present

## 2014-03-09 DIAGNOSIS — E119 Type 2 diabetes mellitus without complications: Secondary | ICD-10-CM | POA: Insufficient documentation

## 2014-03-09 LAB — PROTIME-INR
INR: 1.1 (ref 0.00–1.49)
Prothrombin Time: 14.3 seconds (ref 11.6–15.2)

## 2014-03-09 LAB — I-STAT TROPONIN, ED: TROPONIN I, POC: 0.18 ng/mL — AB (ref 0.00–0.08)

## 2014-03-09 LAB — COMPREHENSIVE METABOLIC PANEL
ALBUMIN: 4.8 g/dL (ref 3.5–5.2)
ALK PHOS: 101 U/L (ref 39–117)
ALT: 59 U/L — ABNORMAL HIGH (ref 0–53)
ANION GAP: 7 (ref 5–15)
AST: 42 U/L — ABNORMAL HIGH (ref 0–37)
BUN: 26 mg/dL — ABNORMAL HIGH (ref 6–23)
CALCIUM: 9.7 mg/dL (ref 8.4–10.5)
CO2: 26 mmol/L (ref 19–32)
Chloride: 107 mEq/L (ref 96–112)
Creatinine, Ser: 1.27 mg/dL (ref 0.50–1.35)
GFR calc Af Amer: 56 mL/min — ABNORMAL LOW (ref >90)
GFR, EST NON AFRICAN AMERICAN: 48 mL/min — AB (ref >90)
Glucose, Bld: 117 mg/dL — ABNORMAL HIGH (ref 70–99)
Potassium: 4 mmol/L (ref 3.5–5.1)
SODIUM: 140 mmol/L (ref 135–145)
TOTAL PROTEIN: 7.8 g/dL (ref 6.0–8.3)
Total Bilirubin: 0.7 mg/dL (ref 0.3–1.2)

## 2014-03-09 LAB — CBC WITH DIFFERENTIAL/PLATELET
BASOS ABS: 0.1 10*3/uL (ref 0.0–0.1)
Basophils Relative: 1 % (ref 0–1)
Eosinophils Absolute: 0 10*3/uL (ref 0.0–0.7)
Eosinophils Relative: 0 % (ref 0–5)
HCT: 35.9 % — ABNORMAL LOW (ref 39.0–52.0)
Hemoglobin: 11.7 g/dL — ABNORMAL LOW (ref 13.0–17.0)
LYMPHS ABS: 2.2 10*3/uL (ref 0.7–4.0)
Lymphocytes Relative: 41 % (ref 12–46)
MCH: 34.1 pg — ABNORMAL HIGH (ref 26.0–34.0)
MCHC: 32.6 g/dL (ref 30.0–36.0)
MCV: 104.7 fL — ABNORMAL HIGH (ref 78.0–100.0)
MONOS PCT: 15 % — AB (ref 3–12)
Monocytes Absolute: 0.8 10*3/uL (ref 0.1–1.0)
Neutro Abs: 2.4 10*3/uL (ref 1.7–7.7)
Neutrophils Relative %: 43 % (ref 43–77)
PLATELETS: 153 10*3/uL (ref 150–400)
RBC: 3.43 MIL/uL — AB (ref 4.22–5.81)
RDW: 15.2 % (ref 11.5–15.5)
WBC: 5.5 10*3/uL (ref 4.0–10.5)

## 2014-03-09 LAB — TROPONIN I: TROPONIN I: 0.18 ng/mL — AB (ref <0.031)

## 2014-03-09 MED ORDER — PREDNISONE 10 MG PO TABS: 60.0000 mg | ORAL_TABLET | Freq: Every day | ORAL | Status: DC

## 2014-03-09 MED ORDER — ALBUTEROL SULFATE (2.5 MG/3ML) 0.083% IN NEBU
5.0000 mg | INHALATION_SOLUTION | Freq: Once | RESPIRATORY_TRACT | Status: AC
  Administered 2014-03-09: 5 mg via RESPIRATORY_TRACT
  Filled 2014-03-09: qty 6

## 2014-03-09 MED ORDER — PREDNISONE 50 MG PO TABS
60.0000 mg | ORAL_TABLET | Freq: Once | ORAL | Status: AC
  Administered 2014-03-09: 60 mg via ORAL
  Filled 2014-03-09 (×2): qty 1

## 2014-03-09 NOTE — ED Provider Notes (Signed)
CSN: [id number]     Arrival date & time 03/09/14  3546 History  This chart was scribed for [name], MD by Edison Simon, ED Scribe. This patient was seen in room APA19/APA19 and the patient's care was started at 11:11 AM.    Chief Complaint  Patient presents with  . Shortness of Breath  . Chest Pain   The history is provided by the patient. No language interpreter was used.    HPI Comments: Bryce Mcdonald is a 81 y.o. male with history of COPD, HTN, PE, DVT, A fib, DM, chronic CHF, CAD who presents to the Emergency Department complaining of SOB which he has chronically but worse 2-3 days ago. He was evaluated here on 12/25 and was diagnosed with blood clot and was hospitalized at [facility] for 5 days. He is still using Lovenox injections BID, self-administered. He states they plan to keep him on injections. He reports associated aching to his right leg since 2-3 days ago and shooting lower abdominal pain. He states he has used inhalers with improvement. A nurse checks on him every Monday, Wednesday, and Friday.    Past Medical History  Diagnosis Date  . Colon cancer     Status post partial colectomy  . Hypertension   . Hypercholesterolemia   . Blood transfusion   . DVT (deep venous thrombosis)     a. Prior h/o such. b. Recurrent subacute bilat DVT 2014.  . PE (pulmonary embolism)   . Diabetes mellitus   . Chronic atrial fibrillation   . Diabetes mellitus, type II   . Ventral hernia   . Unspecified hypothyroidism   . LVH (left ventricular hypertrophy)   . Bilateral renal cysts   . Calculus of left kidney     non-obstructing  . COPD (chronic obstructive pulmonary disease)   . Chronic combined systolic and diastolic CHF (congestive heart failure)     a. Echo 07/2013: EF 40-50%, mod LVH, mild MR, mildly dilated LA, mild-mod TR, PA pressure 52mmHg.  Marland Kitchen RBBB with left anterior fascicular block   . Bradycardia   . Pulmonary hypertension     a. Echo 07/2013: PASP 61mmHg.  . Mild mitral  regurgitation     a. By echo 07/2013.  . Tricuspid regurgitation     a. Mild-mod by echo 07/2013.  . Orthostatic hypotension   . Dementia   . CAD (coronary artery disease)     a. Nonobstructive by cath in 2008. b. Nuc 08/2013: low to intermediate risk. Imaging c/w scar in the inferior wall without any large ischemic territories. LVEF 37% with fairly diffuse hypokinesis.   . Syncope   . Ventral hernia   . Pulmonary nodules   . CKD (chronic kidney disease), stage III     a. based on historical lab data.   Past Surgical History  Procedure Laterality Date  . Abdominal surgery      x 3  . Cardiac catheterization  2008   Family History  Problem Relation Age of Onset  . Hypertension     History  Substance Use Topics  . Smoking status: Former Smoker -- 0.50 packs/day    Types: Cigarettes    Start date: 11/15/1943    Quit date: 02/20/1973  . Smokeless tobacco: Former Systems developer    Types: Chew     Comment: quit over 40 years ago  . Alcohol Use: No     Comment: quit over forty years ago    Review of Systems A complete 10 system  review of systems was obtained and all systems are negative except as noted in the HPI and PMH.    Allergies  Codeine; Penicillins; and Morphine and related  Home Medications   Prior to Admission medications   Medication Sig Start Date End Date Taking? Authorizing Provider  albuterol (PROVENTIL HFA) 108 (90 BASE) MCG/ACT inhaler Inhale 2 puffs into the lungs every 6 (six) hours as needed for wheezing or shortness of breath.   Yes Historical Provider, MD  aspirin 81 MG chewable tablet Chew 81 mg by mouth daily.   Yes Historical Provider, MD  atorvastatin (LIPITOR) 80 MG tablet Take 80 mg by mouth daily.   Yes Historical Provider, MD  enoxaparin (LOVENOX) 80 MG/0.8ML injection Inject 0.8 mLs (80 mg total) into the skin every 12 (twelve) hours. 02/17/14  Yes Delfina Redwood, MD  famotidine (PEPCID) 20 MG tablet Take 1 tablet (20 mg total) by mouth 2 (two) times  daily. 12/06/12  Yes Lezlie Octave Black, NP  furosemide (LASIX) 20 MG tablet Take 1 tablet (20 mg total) by mouth daily as needed for fluid or edema. Patient taking differently: Take 20 mg by mouth daily.  09/09/13  Yes Costin Karlyne Greenspan, MD  levothyroxine (SYNTHROID, LEVOTHROID) 50 MCG tablet Take 50 mcg by mouth daily before breakfast.   Yes Historical Provider, MD  vitamin B-12 (CYANOCOBALAMIN) 1000 MCG tablet Take 1,000 mcg by mouth daily.   Yes Historical Provider, MD  acetaminophen (TYLENOL) 325 MG tablet Take 650 mg by mouth every 6 (six) hours as needed for mild pain or fever.    Historical Provider, MD   BP 144/76 mmHg  Pulse 55  Temp(Src) 98 F (36.7 C) (Oral)  Resp 22  Ht 5\' 8"  (1.727 m)  Wt 179 lb (81.194 kg)  BMI 27.22 kg/m2  SpO2 100% Physical Exam  Constitutional: He is oriented to person, place, and time. He appears well-developed and well-nourished.  HENT:  Head: Normocephalic and atraumatic.  Eyes: EOM are normal.  Neck: Normal range of motion.  Cardiovascular: Normal rate, normal heart sounds and intact distal pulses.  An irregular rhythm present.  Pulmonary/Chest: Effort normal. No respiratory distress. He has rhonchi (right greater than left).  Abdominal: Soft. He exhibits no distension. There is no tenderness.  Small hematoma to right lower abdomen consistent with anticoagulant injections   Musculoskeletal: Normal range of motion.  Neurological: He is alert and oriented to person, place, and time.  Skin: Skin is warm and dry.  Psychiatric: He has a normal mood and affect. Judgment normal.  Nursing note and vitals reviewed.   ED Course  Procedures (including critical care time)  DIAGNOSTIC STUDIES: Oxygen Saturation is 84% on room air, low by my interpretation.    COORDINATION OF CARE: 11:19 AM Discussed treatment plan with patient at beside, the patient agrees with the plan and has no further questions at this time.   Labs Review Labs Reviewed  CBC WITH  DIFFERENTIAL - Abnormal; Notable for the following:    RBC 3.43 (*)    Hemoglobin 11.7 (*)    HCT 35.9 (*)    MCV 104.7 (*)    MCH 34.1 (*)    Monocytes Relative 15 (*)    All other components within normal limits  COMPREHENSIVE METABOLIC PANEL - Abnormal; Notable for the following:    Glucose, Bld 117 (*)    BUN 26 (*)    AST 42 (*)    ALT 59 (*)    GFR calc non  Af Amer 48 (*)    GFR calc Af Amer 56 (*)    All other components within normal limits  TROPONIN I - Abnormal; Notable for the following:    Troponin I 0.18 (*)    All other components within normal limits  I-STAT TROPOININ, ED - Abnormal; Notable for the following:    Troponin i, poc 0.18 (*)    All other components within normal limits  PROTIME-INR    Imaging Review Dg Chest 2 View  03/09/2014   CLINICAL DATA:  Three-day history of difficulty breathing. History of emphysema  EXAM: CHEST  2 VIEW  COMPARISON:  Chest radiograph February 13, 2014; chest CT February 13, 2014  FINDINGS: There is underlying emphysema. There is scarring in the right lower lobe which appears stable. There is no frank edema or consolidation. The heart is mildly enlarged with pulmonary vascularity within normal limits. Is atherosclerotic change in aorta. No adenopathy. No bone lesions.  IMPRESSION: Underlying emphysema. Scarring right lower lobe, stable. No frank edema or consolidation. Heart prominent but stable.   Electronically Signed   By: Lowella Grip M.D.   On: 03/09/2014 11:47  I personally reviewed the imaging tests through PACS system I reviewed available ER/hospitalization records through the EMR    EKG Interpretation   Date/Time:  Monday March 09 2014 09:58:23 EST Ventricular Rate:  52 PR Interval:    QRS Duration: 137 QT Interval:  487 QTC Calculation: 453 R Axis:   -85 Text Interpretation:  Atrial fibrillation RBBB and LAFB Probable lateral  infarct, old No significant change was found Confirmed by Braydon Kullman  MD,  Lennette Bihari  ([id number]) on 03/09/2014 10:17:31 AM      MDM   Final diagnoses:  SOB (shortness of breath)  COPD exacerbation    2:09 PM Patient feels much better after albuterol emergency department.  Likely COPD exacerbation.  Mild elevation in his troponin.  He has had this consistently over the past several weeks when looking back at his prior labs.  I do not believe this to be acute coronary syndrome.  EKG is without ischemic changes.  Discharge home with primary care follow-up.  I personally performed the services described in this documentation, which was scribed in my presence. The recorded information has been reviewed and is accurate.      [name], MD 03/09/14 [phone]

## 2014-03-09 NOTE — ED Notes (Signed)
Pt was in [facility] in Dec, has nurses check on him MWF, pt states last three days SOB has increased.

## 2014-03-09 NOTE — Discharge Instructions (Signed)

## 2014-03-09 NOTE — ED Notes (Signed)
Placed pt on 2L 02, family at the bedside

## 2014-03-10 ENCOUNTER — Encounter (HOSPITAL_COMMUNITY): Payer: Self-pay | Admitting: *Deleted

## 2014-03-10 ENCOUNTER — Emergency Department (HOSPITAL_COMMUNITY): Payer: Non-veteran care

## 2014-03-10 ENCOUNTER — Inpatient Hospital Stay (HOSPITAL_COMMUNITY)
Admission: EM | Admit: 2014-03-10 | Discharge: 2014-03-20 | DRG: 291 | Disposition: A | Discharge status: Home Health | Payer: Non-veteran care | Attending: Internal Medicine | Admitting: Internal Medicine

## 2014-03-10 DIAGNOSIS — I2699 Other pulmonary embolism without acute cor pulmonale: Secondary | ICD-10-CM | POA: Diagnosis not present

## 2014-03-10 DIAGNOSIS — I5043 Acute on chronic combined systolic (congestive) and diastolic (congestive) heart failure: Principal | ICD-10-CM | POA: Diagnosis present

## 2014-03-10 DIAGNOSIS — E119 Type 2 diabetes mellitus without complications: Secondary | ICD-10-CM

## 2014-03-10 DIAGNOSIS — E1165 Type 2 diabetes mellitus with hyperglycemia: Secondary | ICD-10-CM | POA: Diagnosis present

## 2014-03-10 DIAGNOSIS — T380X5A Adverse effect of glucocorticoids and synthetic analogues, initial encounter: Secondary | ICD-10-CM | POA: Diagnosis present

## 2014-03-10 DIAGNOSIS — Z86711 Personal history of pulmonary embolism: Secondary | ICD-10-CM

## 2014-03-10 DIAGNOSIS — Z7982 Long term (current) use of aspirin: Secondary | ICD-10-CM

## 2014-03-10 DIAGNOSIS — R0600 Dyspnea, unspecified: Secondary | ICD-10-CM | POA: Diagnosis present

## 2014-03-10 DIAGNOSIS — Z9049 Acquired absence of other specified parts of digestive tract: Secondary | ICD-10-CM | POA: Diagnosis present

## 2014-03-10 DIAGNOSIS — R0902 Hypoxemia: Secondary | ICD-10-CM

## 2014-03-10 DIAGNOSIS — Z7952 Long term (current) use of systemic steroids: Secondary | ICD-10-CM

## 2014-03-10 DIAGNOSIS — Z885 Allergy status to narcotic agent status: Secondary | ICD-10-CM

## 2014-03-10 DIAGNOSIS — Z66 Do not resuscitate: Secondary | ICD-10-CM | POA: Diagnosis present

## 2014-03-10 DIAGNOSIS — I2510 Atherosclerotic heart disease of native coronary artery without angina pectoris: Secondary | ICD-10-CM | POA: Diagnosis present

## 2014-03-10 DIAGNOSIS — I5042 Chronic combined systolic (congestive) and diastolic (congestive) heart failure: Secondary | ICD-10-CM

## 2014-03-10 DIAGNOSIS — J441 Chronic obstructive pulmonary disease with (acute) exacerbation: Secondary | ICD-10-CM | POA: Diagnosis present

## 2014-03-10 DIAGNOSIS — E039 Hypothyroidism, unspecified: Secondary | ICD-10-CM | POA: Diagnosis present

## 2014-03-10 DIAGNOSIS — D638 Anemia in other chronic diseases classified elsewhere: Secondary | ICD-10-CM | POA: Diagnosis present

## 2014-03-10 DIAGNOSIS — Z8249 Family history of ischemic heart disease and other diseases of the circulatory system: Secondary | ICD-10-CM

## 2014-03-10 DIAGNOSIS — N183 Chronic kidney disease, stage 3 unspecified: Secondary | ICD-10-CM | POA: Diagnosis present

## 2014-03-10 DIAGNOSIS — R197 Diarrhea, unspecified: Secondary | ICD-10-CM

## 2014-03-10 DIAGNOSIS — I10 Essential (primary) hypertension: Secondary | ICD-10-CM | POA: Diagnosis present

## 2014-03-10 DIAGNOSIS — J9620 Acute and chronic respiratory failure, unspecified whether with hypoxia or hypercapnia: Secondary | ICD-10-CM | POA: Diagnosis present

## 2014-03-10 DIAGNOSIS — I482 Chronic atrial fibrillation: Secondary | ICD-10-CM | POA: Diagnosis present

## 2014-03-10 DIAGNOSIS — Z86718 Personal history of other venous thrombosis and embolism: Secondary | ICD-10-CM

## 2014-03-10 DIAGNOSIS — J9621 Acute and chronic respiratory failure with hypoxia: Secondary | ICD-10-CM | POA: Diagnosis present

## 2014-03-10 DIAGNOSIS — F0390 Unspecified dementia without behavioral disturbance: Secondary | ICD-10-CM | POA: Diagnosis present

## 2014-03-10 DIAGNOSIS — I82409 Acute embolism and thrombosis of unspecified deep veins of unspecified lower extremity: Secondary | ICD-10-CM

## 2014-03-10 DIAGNOSIS — K5900 Constipation, unspecified: Secondary | ICD-10-CM | POA: Diagnosis present

## 2014-03-10 DIAGNOSIS — R0609 Other forms of dyspnea: Secondary | ICD-10-CM

## 2014-03-10 DIAGNOSIS — I129 Hypertensive chronic kidney disease with stage 1 through stage 4 chronic kidney disease, or unspecified chronic kidney disease: Secondary | ICD-10-CM | POA: Diagnosis present

## 2014-03-10 DIAGNOSIS — E780 Pure hypercholesterolemia: Secondary | ICD-10-CM | POA: Diagnosis present

## 2014-03-10 DIAGNOSIS — I248 Other forms of acute ischemic heart disease: Secondary | ICD-10-CM | POA: Diagnosis present

## 2014-03-10 DIAGNOSIS — I272 Other secondary pulmonary hypertension: Secondary | ICD-10-CM | POA: Diagnosis present

## 2014-03-10 DIAGNOSIS — Z85038 Personal history of other malignant neoplasm of large intestine: Secondary | ICD-10-CM

## 2014-03-10 DIAGNOSIS — D649 Anemia, unspecified: Secondary | ICD-10-CM | POA: Diagnosis present

## 2014-03-10 LAB — BASIC METABOLIC PANEL
Anion gap: 8 (ref 5–15)
BUN: 31 mg/dL — AB (ref 6–23)
CHLORIDE: 106 meq/L (ref 96–112)
CO2: 25 mmol/L (ref 19–32)
Calcium: 9.7 mg/dL (ref 8.4–10.5)
Creatinine, Ser: 1.18 mg/dL (ref 0.50–1.35)
GFR calc non Af Amer: 52 mL/min — ABNORMAL LOW (ref >90)
GFR, EST AFRICAN AMERICAN: 61 mL/min — AB (ref >90)
Glucose, Bld: 155 mg/dL — ABNORMAL HIGH (ref 70–99)
POTASSIUM: 4.5 mmol/L (ref 3.5–5.1)
Sodium: 139 mmol/L (ref 135–145)

## 2014-03-10 LAB — CBC
HCT: 30.5 % — ABNORMAL LOW (ref 39.0–52.0)
HEMOGLOBIN: 10 g/dL — AB (ref 13.0–17.0)
MCH: 34.4 pg — ABNORMAL HIGH (ref 26.0–34.0)
MCHC: 32.8 g/dL (ref 30.0–36.0)
MCV: 104.8 fL — ABNORMAL HIGH (ref 78.0–100.0)
Platelets: 138 10*3/uL — ABNORMAL LOW (ref 150–400)
RBC: 2.91 MIL/uL — ABNORMAL LOW (ref 4.22–5.81)
RDW: 15.5 % (ref 11.5–15.5)
WBC: 4 10*3/uL (ref 4.0–10.5)

## 2014-03-10 LAB — TROPONIN I: TROPONIN I: 0.17 ng/mL — AB (ref <0.031)

## 2014-03-10 MED ORDER — IOHEXOL 350 MG/ML SOLN
100.0000 mL | Freq: Once | INTRAVENOUS | Status: AC | PRN
  Administered 2014-03-10: 100 mL via INTRAVENOUS

## 2014-03-10 NOTE — ED Notes (Signed)
EMS reports pt was admitted at [facility] for a PE and was discharged yesterday. Pt was having difficulty breathing and grunting a lot. Pt reports urinary retention.

## 2014-03-10 NOTE — ED Provider Notes (Signed)
CSN: [id number]     Arrival date & time 03/10/14  2231 History   None    This chart was scribed for Bryce Norrie, MD by Bryce Mcdonald, ED Scribe. This patient was seen in room APA02/APA02 and the patient's care was started 11:10 PM.   Chief Complaint  Patient presents with  . Shortness of Breath   HPI  HPI Comments: Bryce Mcdonald brought in by EMS is a 81 y.o. male with a PMHx of colon cancer, HTN, hypercholesterolemia, DVT, DM, pulmonary embolism, COPD, CHF, RBBB, CAD, and CKD who presents to the Emergency Department complaining of ongoing SOB with exertion onset 9 PM this evening. He also reports associated wheezing. Pt states he used his inhaler approximately 10-12 times this evening without any improvement for symptoms. He states he was walking in the hallway to exercise his legs when he dropped to his knees after experiencing SOB and urinary incontinence. States he stayed down to the ground for only a short period of time and managed to get himself up. Bryce Mcdonald attempted to use the bathroom after eating a sandwich after incident and was unable to urinate or have a bowel movement. He denies any cough or chest pain. Pt was recently seen 1/18 for SOB secondary to possible COPD exacerbation and discharged home. Previously pt was evaluated 12/25 and diagnosed with a blood clot. At that time he was hospitalized for 5 days and started on self administered Lovonax injections BID which is still routine for Mcdonald. Bryce Mcdonald has lived in an assisted living apartment for 5 years now and walks with a walker at baseline. No tobacco use or alcohol consumption. Pt with known allergy to Codeine, Morphine, and Penicillins.  He is followed by [facility] for medical services.  Past Medical History  Diagnosis Date  . Colon cancer     Status post partial colectomy  . Hypertension   . Hypercholesterolemia   . Blood transfusion   . DVT (deep venous thrombosis)     a. Prior h/o such. b. Recurrent subacute  bilat DVT 2014.  . PE (pulmonary embolism)   . Diabetes mellitus   . Chronic atrial fibrillation   . Diabetes mellitus, type II   . Ventral hernia   . Unspecified hypothyroidism   . LVH (left ventricular hypertrophy)   . Bilateral renal cysts   . Calculus of left kidney     non-obstructing  . COPD (chronic obstructive pulmonary disease)   . Chronic combined systolic and diastolic CHF (congestive heart failure)     a. Echo 07/2013: EF 40-50%, mod LVH, mild MR, mildly dilated LA, mild-mod TR, PA pressure 50mmHg.  Marland Kitchen RBBB with left anterior fascicular block   . Bradycardia   . Pulmonary hypertension     a. Echo 07/2013: PASP 53mmHg.  . Mild mitral regurgitation     a. By echo 07/2013.  . Tricuspid regurgitation     a. Mild-mod by echo 07/2013.  . Orthostatic hypotension   . Dementia   . CAD (coronary artery disease)     a. Nonobstructive by cath in 2008. b. Nuc 08/2013: low to intermediate risk. Imaging c/w scar in the inferior wall without any large ischemic territories. LVEF 37% with fairly diffuse hypokinesis.   . Syncope   . Ventral hernia   . Pulmonary nodules   . CKD (chronic kidney disease), stage III     a. based on historical lab data.   Past Surgical History  Procedure Laterality Date  .  Abdominal surgery      x 3  . Cardiac catheterization  2008   Family History  Problem Relation Age of Onset  . Hypertension     History  Substance Use Topics  . Smoking status: Former Smoker -- 0.50 packs/day    Types: Cigarettes    Start date: 11/15/1943    Quit date: 02/20/1973  . Smokeless tobacco: Former Systems developer    Types: Chew     Comment: quit over 40 years ago  . Alcohol Use: No     Comment: quit over forty years ago  lives in an ALF for 5 years Uses a walker  Review of Systems  Constitutional: Negative for fever and chills.  Respiratory: Positive for wheezing. Negative for cough.   Cardiovascular: Positive for leg swelling. Negative for chest pain.  All other systems  reviewed and are negative.     Allergies  Codeine; Penicillins; and Morphine and related  Home Medications   Prior to Admission medications   Medication Sig Start Date End Date Taking? Authorizing Provider  albuterol (PROVENTIL HFA) 108 (90 BASE) MCG/ACT inhaler Inhale 2 puffs into the lungs every 6 (six) hours as needed for wheezing or shortness of breath.   Yes Historical Provider, MD  aspirin 81 MG chewable tablet Chew 81 mg by mouth daily.   Yes Historical Provider, MD  atorvastatin (LIPITOR) 80 MG tablet Take 80 mg by mouth daily.   Yes Historical Provider, MD  enoxaparin (LOVENOX) 80 MG/0.8ML injection Inject 0.8 mLs (80 mg total) into the skin every 12 (twelve) hours. 02/17/14  Yes Delfina Redwood, MD  famotidine (PEPCID) 20 MG tablet Take 1 tablet (20 mg total) by mouth 2 (two) times daily. 12/06/12  Yes Lezlie Octave Black, NP  furosemide (LASIX) 20 MG tablet Take 1 tablet (20 mg total) by mouth daily as needed for fluid or edema. Patient taking differently: Take 20 mg by mouth daily.  09/09/13  Yes Costin Karlyne Greenspan, MD  levothyroxine (SYNTHROID, LEVOTHROID) 50 MCG tablet Take 50 mcg by mouth daily before breakfast.   Yes Historical Provider, MD  predniSONE (DELTASONE) 10 MG tablet Take 6 tablets (60 mg total) by mouth daily. 03/09/14  Yes [name], MD  vitamin B-12 (CYANOCOBALAMIN) 1000 MCG tablet Take 1,000 mcg by mouth daily.   Yes Historical Provider, MD  acetaminophen (TYLENOL) 325 MG tablet Take 650 mg by mouth every 6 (six) hours as needed for mild pain or fever.    Historical Provider, MD   Triage Vitals: BP 146/80 mmHg  Pulse 65  Temp(Src) 97.9 F (36.6 C) (Oral)  Resp 21  Ht 5\' 8"  (1.727 m)  Wt 182 lb (82.555 kg)  BMI 27.68 kg/m2  SpO2 98%   Vital signs normal    Physical Exam  Constitutional: He is oriented to person, place, and time. He appears well-developed and well-nourished.  Non-toxic appearance. He does not appear ill. No distress.  HENT:  Head:  Normocephalic and atraumatic.  Right Ear: External ear normal.  Left Ear: External ear normal.  Nose: Nose normal. No mucosal edema or rhinorrhea.  Mouth/Throat: Oropharynx is clear and moist and mucous membranes are normal. No dental abscesses or uvula swelling.  Eyes: Conjunctivae and EOM are normal. Pupils are equal, round, and reactive to light.  Neck: Normal range of motion and full passive range of motion without pain. Neck supple.  Cardiovascular: Normal rate, regular rhythm and normal heart sounds.  Exam reveals no gallop and no friction rub.  No murmur heard. Pulmonary/Chest: Accessory muscle usage present. Tachypnea noted. He is in respiratory distress. He has decreased breath sounds. He has no wheezes. He has no rhonchi. He has no rales. He exhibits no tenderness and no crepitus.  Appears sob when talking Diminished breath sounds diffusely  Abdominal: Soft. Normal appearance and bowel sounds are normal. He exhibits no distension. There is no tenderness. There is no rebound and no guarding.  Musculoskeletal: Normal range of motion. He exhibits edema. He exhibits no tenderness.  Trace edema of lower extremities   Neurological: He is alert and oriented to person, place, and time. He has normal strength. No cranial nerve deficit.  Skin: Skin is warm, dry and intact. No rash noted. No erythema. No pallor.  Psychiatric: He has a normal mood and affect. His speech is normal and behavior is normal. His mood appears not anxious.  Nursing note and vitals reviewed.   ED Course  Procedures (including critical care time)  DIAGNOSTIC STUDIES: Oxygen Saturation is 98% on RA, Normal by my interpretation.    COORDINATION OF CARE: 11:21 PM-Discussed treatment plan with pt at bedside and pt agreed to plan.  We are going to do a CT angios chest to make sure he is not developing new pulmonary embolus.  Patient's test results were discussed with patient and his daughter. I had nursing staff  ambulate the patient. They report there was poor finger contact with the pulse ox during ambulation and his pulse ox appeared to drop into the 60s. However when the putting back on his stretcher and immediately attach the pulse ox his oxygen level was 83% and rapidly improved to 95% on room air. It appears the patient is going to need oxygen with exertion. This may be part of his symptoms that he had tonight with his collapse when he was exerting himself.  01:28 Dr Darrick Meigs admit to observation, telemetry      Labs Review Results for orders placed or performed during the hospital encounter of 03/10/14  Troponin I  Result Value Ref Range   Troponin I 0.17 (H) <0.031 ng/mL  CBC  Result Value Ref Range   WBC 4.0 4.0 - 10.5 K/uL   RBC 2.91 (L) 4.22 - 5.81 MIL/uL   Hemoglobin 10.0 (L) 13.0 - 17.0 g/dL   HCT 30.5 (L) 39.0 - 52.0 %   MCV 104.8 (H) 78.0 - 100.0 fL   MCH 34.4 (H) 26.0 - 34.0 pg   MCHC 32.8 30.0 - 36.0 g/dL   RDW 15.5 11.5 - 15.5 %   Platelets 138 (L) 150 - 400 K/uL  Basic metabolic panel  Result Value Ref Range   Sodium 139 135 - 145 mmol/L   Potassium 4.5 3.5 - 5.1 mmol/L   Chloride 106 96 - 112 mEq/L   CO2 25 19 - 32 mmol/L   Glucose, Bld 155 (H) 70 - 99 mg/dL   BUN 31 (H) 6 - 23 mg/dL   Creatinine, Ser 1.18 0.50 - 1.35 mg/dL   Calcium 9.7 8.4 - 10.5 mg/dL   GFR calc non Af Amer 52 (L) >90 mL/min   GFR calc Af Amer 61 (L) >90 mL/min   Anion gap 8 5 - 15  Blood gas, arterial ([facility] & [facility] ONLY)  Result Value Ref Range   O2 Content 2.0 L/min   Delivery systems NASAL CANNULA    pH, Arterial 7.375 7.350 - 7.450   pCO2 arterial 35.1 35.0 - 45.0 mmHg   pO2, Arterial 101.0 (H)  80.0 - 100.0 mmHg   Bicarbonate 20.1 20.0 - 24.0 mEq/L   TCO2 18.2 0 - 100 mmol/L   Acid-base deficit 4.3 (H) 0.0 - 2.0 mmol/L   O2 Saturation 97.2 %   Patient temperature 37.0    Collection site RIGHT RADIAL    Drawn by [id number]    Sample type ARTERIAL    Allens test (pass/fail) PASS PASS  Brain  natriuretic peptide  Result Value Ref Range   B Natriuretic Peptide 560.0 (H) 0.0 - 100.0 pg/mL   Laboratory interpretation all normal except some increased level of his BMP from his baseline, normal ABG at rest on room air. Stable anemia   Imaging Review Dg Chest 2 View  03/09/2014   CLINICAL DATA:  Three-day history of difficulty breathing. History of emphysema  EXAM: CHEST  2 VIEW  COMPARISON:  Chest radiograph February 13, 2014; chest CT February 13, 2014  FINDINGS: There is underlying emphysema. There is scarring in the right lower lobe which appears stable. There is no frank edema or consolidation. The heart is mildly enlarged with pulmonary vascularity within normal limits. Is atherosclerotic change in aorta. No adenopathy. No bone lesions.  IMPRESSION: Underlying emphysema. Scarring right lower lobe, stable. No frank edema or consolidation. Heart prominent but stable.   Electronically Signed   By: Lowella Grip M.D.   On: 03/09/2014 11:47   Ct Angio Chest Pe W/cm &/or Wo Cm  03/11/2014   CLINICAL DATA:  Shortness of breath.  Recent pulmonary embolism.  EXAM: CT ANGIOGRAPHY CHEST WITH CONTRAST  TECHNIQUE: Multidetector CT imaging of the chest was performed using the standard protocol during bolus administration of intravenous contrast. Multiplanar CT image reconstructions and MIPs were obtained to evaluate the vascular anatomy.  CONTRAST:  175mL OMNIPAQUE IOHEXOL 350 MG/ML SOLN  COMPARISON:  02/13/2014  FINDINGS: THORACIC INLET/BODY WALL:  No acute abnormality.  MEDIASTINUM:  Normal heart size. No pericardial effusion. There is atherosclerosis, including the coronary arteries. A small persistent left SVC is incidentally noted. There is non opacification of the left atrial appendage. Although there is early opacification of the left heart, this was also seen previously. Noted history of atrial fibrillation. No acute or chronic pulmonary embolism. No evidence of acute aortic pathology, although  there is no enhancement within the systemic arterial tree.  LUNG WINDOWS:  Centrilobular emphysema which is mild. Bibasilar scarring which is stable. No edema or pneumonia. Trace bilateral pleural effusion.  There are multiple small scattered pulmonary nodules that are peripheral. These are stable from 2014 at least, including the largest within the right middle lobe at 5 mm (image 50).  UPPER ABDOMEN:  No acute findings.  OSSEOUS:  No acute fracture.  No suspicious lytic or blastic lesions.  Review of the MIP images confirms the above findings.  IMPRESSION: 1. No acute or chronic pulmonary embolism. 2. Non enhancement of the left atrial appendage which could be delayed mixing or thrombus in this patient with history of atrial fibrillation. 3. Trace bilateral pleural effusion. 4. Emphysema.   Electronically Signed   By: Jorje Guild M.D.   On: 03/11/2014 00:07    Dg Chest 1 View  02/13/2014   CLINICAL DATA:  Right facial paresthesias, right neck pain, coronary disease, atrial fibrillation  .  IMPRESSION: Stable COPD/emphysema with right lower lobe parenchymal scarring.   Electronically Signed   By: Daryll Brod M.D.   On: 02/13/2014 19:22   Dg Chest 2 View  03/09/2014   CLINICAL DATA:  Three-day  history of difficulty breathing. History of emphysema  .  IMPRESSION: Underlying emphysema. Scarring right lower lobe, stable. No frank edema or consolidation. Heart prominent but stable.   Electronically Signed   By: Lowella Grip M.D.   On: 03/09/2014 11:47   Ct Head Wo Contrast  02/13/2014   CLINICAL DATA:  Acute syncopal episode    IMPRESSION: Stable atrophy and chronic white matter ischemic changes. No interval change or acute process by noncontrast CT.   Electronically Signed   By: Daryll Brod M.D.   On: 02/13/2014 19:12   Ct Angio Chest Aorta W/cm &/or Wo/cm  02/13/2014   CLINICAL DATA:  Cough, weakness, shortness of breath, altered mental status. Symptoms began this morning, syncope. RIGHT  facial numbness for 2 days. Hallucinations.  IMPRESSION: Nonocclusive RIGHT lower lobe segmental pulmonary embolus appears acute. CT evidence of right heart strain appears unchanged from prior imaging. (RV/LV Ratio = Positive for acute PE with CTevidence of right heart strain (RV/LV Ratio = 1) consistent with at least submassive (intermediate risk) PE. The presence of right heart strain has been associated with an increased risk of morbidity and mortality.) consistent with at least submassive (intermediate risk) PE. The presence of right heart strain has been associated with an increased risk of morbidity and mortality.  Stable pulmonary nodules.  Acute findings discussed with and reconfirmed by Dr.Pickerington on 02/13/2014 at 10:26 pm.   Electronically Signed   By: Elon Alas   On: 02/13/2014 22:28       EKG Interpretation   Date/Time:  Tuesday March 10 2014 22:35:36 EST Ventricular Rate:  68 PR Interval:    QRS Duration: 122 QT Interval:  358 QTC Calculation: 381 R Axis:   -97 Text Interpretation:  Atrial fibrillation RBBB and LAFB Nonspecific T  abnormalities, lateral leads No significant change since last tracing 09 Mar 2014 Confirmed by [facility]  MD-I, Leisel Pinette ([id number]) on 03/10/2014 11:21:45 PM      MDM   Final diagnoses:  DOE (dyspnea on exertion)  Hypoxia    Plan admission to observation  Rolland Porter, MD, FACEP   I personally performed the services described in this documentation, which was scribed in my presence. The recorded information has been reviewed and considered.  Rolland Porter, MD, Abram Sander     Bryce Norrie, MD 03/11/14 [phone]

## 2014-03-10 NOTE — ED Notes (Signed)
Pt was here on 03/09/14 for COPD.

## 2014-03-11 DIAGNOSIS — I129 Hypertensive chronic kidney disease with stage 1 through stage 4 chronic kidney disease, or unspecified chronic kidney disease: Secondary | ICD-10-CM | POA: Diagnosis present

## 2014-03-11 DIAGNOSIS — I10 Essential (primary) hypertension: Secondary | ICD-10-CM

## 2014-03-11 DIAGNOSIS — Z86711 Personal history of pulmonary embolism: Secondary | ICD-10-CM | POA: Diagnosis not present

## 2014-03-11 DIAGNOSIS — Z9049 Acquired absence of other specified parts of digestive tract: Secondary | ICD-10-CM | POA: Diagnosis present

## 2014-03-11 DIAGNOSIS — Z7901 Long term (current) use of anticoagulants: Secondary | ICD-10-CM

## 2014-03-11 DIAGNOSIS — R0602 Shortness of breath: Secondary | ICD-10-CM

## 2014-03-11 DIAGNOSIS — J441 Chronic obstructive pulmonary disease with (acute) exacerbation: Secondary | ICD-10-CM | POA: Diagnosis not present

## 2014-03-11 DIAGNOSIS — I4891 Unspecified atrial fibrillation: Secondary | ICD-10-CM

## 2014-03-11 DIAGNOSIS — I272 Other secondary pulmonary hypertension: Secondary | ICD-10-CM | POA: Diagnosis present

## 2014-03-11 DIAGNOSIS — Z7982 Long term (current) use of aspirin: Secondary | ICD-10-CM | POA: Diagnosis not present

## 2014-03-11 DIAGNOSIS — I5043 Acute on chronic combined systolic (congestive) and diastolic (congestive) heart failure: Secondary | ICD-10-CM | POA: Diagnosis not present

## 2014-03-11 DIAGNOSIS — I2699 Other pulmonary embolism without acute cor pulmonale: Secondary | ICD-10-CM | POA: Diagnosis not present

## 2014-03-11 DIAGNOSIS — Z86718 Personal history of other venous thrombosis and embolism: Secondary | ICD-10-CM | POA: Diagnosis not present

## 2014-03-11 DIAGNOSIS — J9601 Acute respiratory failure with hypoxia: Secondary | ICD-10-CM | POA: Diagnosis not present

## 2014-03-11 DIAGNOSIS — J9621 Acute and chronic respiratory failure with hypoxia: Secondary | ICD-10-CM | POA: Diagnosis not present

## 2014-03-11 DIAGNOSIS — D638 Anemia in other chronic diseases classified elsewhere: Secondary | ICD-10-CM | POA: Diagnosis present

## 2014-03-11 DIAGNOSIS — F0390 Unspecified dementia without behavioral disturbance: Secondary | ICD-10-CM | POA: Diagnosis present

## 2014-03-11 DIAGNOSIS — K5900 Constipation, unspecified: Secondary | ICD-10-CM | POA: Diagnosis present

## 2014-03-11 DIAGNOSIS — I482 Chronic atrial fibrillation: Secondary | ICD-10-CM | POA: Diagnosis not present

## 2014-03-11 DIAGNOSIS — E1165 Type 2 diabetes mellitus with hyperglycemia: Secondary | ICD-10-CM | POA: Diagnosis present

## 2014-03-11 DIAGNOSIS — R0609 Other forms of dyspnea: Secondary | ICD-10-CM | POA: Diagnosis not present

## 2014-03-11 DIAGNOSIS — R0600 Dyspnea, unspecified: Secondary | ICD-10-CM | POA: Diagnosis not present

## 2014-03-11 DIAGNOSIS — E039 Hypothyroidism, unspecified: Secondary | ICD-10-CM | POA: Diagnosis present

## 2014-03-11 DIAGNOSIS — R0902 Hypoxemia: Secondary | ICD-10-CM | POA: Diagnosis present

## 2014-03-11 DIAGNOSIS — E780 Pure hypercholesterolemia: Secondary | ICD-10-CM | POA: Diagnosis present

## 2014-03-11 DIAGNOSIS — N183 Chronic kidney disease, stage 3 (moderate): Secondary | ICD-10-CM

## 2014-03-11 DIAGNOSIS — Z85038 Personal history of other malignant neoplasm of large intestine: Secondary | ICD-10-CM | POA: Diagnosis not present

## 2014-03-11 DIAGNOSIS — Z66 Do not resuscitate: Secondary | ICD-10-CM | POA: Diagnosis present

## 2014-03-11 DIAGNOSIS — Z885 Allergy status to narcotic agent status: Secondary | ICD-10-CM | POA: Diagnosis not present

## 2014-03-11 DIAGNOSIS — Z8249 Family history of ischemic heart disease and other diseases of the circulatory system: Secondary | ICD-10-CM | POA: Diagnosis not present

## 2014-03-11 DIAGNOSIS — I2510 Atherosclerotic heart disease of native coronary artery without angina pectoris: Secondary | ICD-10-CM | POA: Diagnosis present

## 2014-03-11 DIAGNOSIS — I248 Other forms of acute ischemic heart disease: Secondary | ICD-10-CM

## 2014-03-11 DIAGNOSIS — I5033 Acute on chronic diastolic (congestive) heart failure: Secondary | ICD-10-CM | POA: Diagnosis not present

## 2014-03-11 DIAGNOSIS — I5189 Other ill-defined heart diseases: Secondary | ICD-10-CM | POA: Diagnosis not present

## 2014-03-11 DIAGNOSIS — I369 Nonrheumatic tricuspid valve disorder, unspecified: Secondary | ICD-10-CM

## 2014-03-11 DIAGNOSIS — J438 Other emphysema: Secondary | ICD-10-CM | POA: Diagnosis not present

## 2014-03-11 DIAGNOSIS — I5042 Chronic combined systolic (congestive) and diastolic (congestive) heart failure: Secondary | ICD-10-CM | POA: Diagnosis not present

## 2014-03-11 DIAGNOSIS — Z7952 Long term (current) use of systemic steroids: Secondary | ICD-10-CM | POA: Diagnosis not present

## 2014-03-11 DIAGNOSIS — T380X5A Adverse effect of glucocorticoids and synthetic analogues, initial encounter: Secondary | ICD-10-CM | POA: Diagnosis present

## 2014-03-11 LAB — GLUCOSE, CAPILLARY
GLUCOSE-CAPILLARY: 187 mg/dL — AB (ref 70–99)
Glucose-Capillary: 233 mg/dL — ABNORMAL HIGH (ref 70–99)
Glucose-Capillary: 193 mg/dL — ABNORMAL HIGH (ref 70–99)

## 2014-03-11 LAB — COMPREHENSIVE METABOLIC PANEL
ALK PHOS: 89 U/L (ref 39–117)
ALT: 74 U/L — ABNORMAL HIGH (ref 0–53)
AST: 53 U/L — ABNORMAL HIGH (ref 0–37)
Albumin: 4.3 g/dL (ref 3.5–5.2)
Anion gap: 8 (ref 5–15)
BUN: 29 mg/dL — AB (ref 6–23)
CALCIUM: 9.5 mg/dL (ref 8.4–10.5)
CO2: 24 mmol/L (ref 19–32)
Chloride: 107 mEq/L (ref 96–112)
Creatinine, Ser: 1.16 mg/dL (ref 0.50–1.35)
GFR, EST AFRICAN AMERICAN: 62 mL/min — AB (ref >90)
GFR, EST NON AFRICAN AMERICAN: 54 mL/min — AB (ref >90)
GLUCOSE: 178 mg/dL — AB (ref 70–99)
POTASSIUM: 4.5 mmol/L (ref 3.5–5.1)
Sodium: 139 mmol/L (ref 135–145)
TOTAL PROTEIN: 7 g/dL (ref 6.0–8.3)
Total Bilirubin: 0.5 mg/dL (ref 0.3–1.2)

## 2014-03-11 LAB — RETICULOCYTES
RBC.: 2.96 MIL/uL — ABNORMAL LOW (ref 4.22–5.81)
RETIC COUNT ABSOLUTE: 106.6 10*3/uL (ref 19.0–186.0)
Retic Ct Pct: 3.6 % — ABNORMAL HIGH (ref 0.4–3.1)

## 2014-03-11 LAB — BLOOD GAS, ARTERIAL
Acid-base deficit: 4.3 mmol/L — ABNORMAL HIGH (ref 0.0–2.0)
BICARBONATE: 20.1 meq/L (ref 20.0–24.0)
Drawn by: 213101
O2 Content: 2 L/min
O2 SAT: 97.2 %
PH ART: 7.375 (ref 7.350–7.450)
PO2 ART: 101 mmHg — AB (ref 80.0–100.0)
Patient temperature: 37
TCO2: 18.2 mmol/L (ref 0–100)
pCO2 arterial: 35.1 mmHg (ref 35.0–45.0)

## 2014-03-11 LAB — TROPONIN I
TROPONIN I: 0.18 ng/mL — AB (ref <0.031)
Troponin I: 0.18 ng/mL — ABNORMAL HIGH (ref <0.031)
Troponin I: 0.17 ng/mL — ABNORMAL HIGH (ref <0.031)

## 2014-03-11 LAB — CBC
HEMATOCRIT: 31.7 % — AB (ref 39.0–52.0)
HEMOGLOBIN: 10.3 g/dL — AB (ref 13.0–17.0)
MCH: 34.1 pg — ABNORMAL HIGH (ref 26.0–34.0)
MCHC: 32.5 g/dL (ref 30.0–36.0)
MCV: 105 fL — AB (ref 78.0–100.0)
Platelets: 141 10*3/uL — ABNORMAL LOW (ref 150–400)
RBC: 3.02 MIL/uL — ABNORMAL LOW (ref 4.22–5.81)
RDW: 15.6 % — AB (ref 11.5–15.5)
WBC: 3.8 10*3/uL — ABNORMAL LOW (ref 4.0–10.5)

## 2014-03-11 LAB — IRON AND TIBC
IRON: 61 ug/dL (ref 42–165)
Saturation Ratios: 21 % (ref 20–55)
TIBC: 294 ug/dL (ref 215–435)
UIBC: 233 ug/dL (ref 125–400)

## 2014-03-11 LAB — BRAIN NATRIURETIC PEPTIDE: B Natriuretic Peptide: 560 pg/mL — ABNORMAL HIGH (ref 0.0–100.0)

## 2014-03-11 LAB — OCCULT BLOOD X 1 CARD TO LAB, STOOL: Fecal Occult Bld: NEGATIVE

## 2014-03-11 MED ORDER — LEVOTHYROXINE SODIUM 50 MCG PO TABS
50.0000 ug | ORAL_TABLET | Freq: Every day | ORAL | Status: DC
  Administered 2014-03-11 – 2014-03-20 (×10): 50 ug via ORAL
  Filled 2014-03-11 (×10): qty 1

## 2014-03-11 MED ORDER — SODIUM CHLORIDE 0.9 % IV SOLN: 250.0000 mL | INTRAVENOUS | Status: DC | PRN

## 2014-03-11 MED ORDER — ALBUTEROL SULFATE (2.5 MG/3ML) 0.083% IN NEBU: 5.0000 mg | INHALATION_SOLUTION | RESPIRATORY_TRACT | Status: AC | PRN

## 2014-03-11 MED ORDER — IPRATROPIUM-ALBUTEROL 0.5-2.5 (3) MG/3ML IN SOLN
3.0000 mL | Freq: Four times a day (QID) | RESPIRATORY_TRACT | Status: DC
  Administered 2014-03-11 (×2): 3 mL via RESPIRATORY_TRACT
  Filled 2014-03-11 (×2): qty 3

## 2014-03-11 MED ORDER — SODIUM CHLORIDE 0.9 % IJ SOLN
3.0000 mL | INTRAMUSCULAR | Status: DC | PRN
  Administered 2014-03-15: 3 mL via INTRAVENOUS
  Filled 2014-03-11: qty 3

## 2014-03-11 MED ORDER — ASPIRIN 81 MG PO CHEW
81.0000 mg | CHEWABLE_TABLET | Freq: Every day | ORAL | Status: DC
  Administered 2014-03-11 – 2014-03-20 (×10): 81 mg via ORAL
  Filled 2014-03-11 (×10): qty 1

## 2014-03-11 MED ORDER — ALBUTEROL SULFATE (2.5 MG/3ML) 0.083% IN NEBU
2.5000 mg | INHALATION_SOLUTION | RESPIRATORY_TRACT | Status: DC
  Administered 2014-03-11 – 2014-03-12 (×6): 2.5 mg via RESPIRATORY_TRACT
  Filled 2014-03-11 (×6): qty 3

## 2014-03-11 MED ORDER — ATORVASTATIN CALCIUM 40 MG PO TABS
80.0000 mg | ORAL_TABLET | Freq: Every day | ORAL | Status: DC
  Administered 2014-03-11 – 2014-03-20 (×10): 80 mg via ORAL
  Filled 2014-03-11 (×8): qty 2
  Filled 2014-03-11: qty 1
  Filled 2014-03-11 (×2): qty 2

## 2014-03-11 MED ORDER — SODIUM CHLORIDE 0.9 % IJ SOLN
3.0000 mL | Freq: Two times a day (BID) | INTRAMUSCULAR | Status: DC
  Administered 2014-03-11 – 2014-03-19 (×7): 3 mL via INTRAVENOUS

## 2014-03-11 MED ORDER — METHYLPREDNISOLONE SODIUM SUCC 125 MG IJ SOLR
60.0000 mg | Freq: Four times a day (QID) | INTRAMUSCULAR | Status: DC
  Administered 2014-03-11 – 2014-03-12 (×7): 60 mg via INTRAVENOUS
  Filled 2014-03-11 (×8): qty 2

## 2014-03-11 MED ORDER — INSULIN ASPART 100 UNIT/ML ~~LOC~~ SOLN
0.0000 [IU] | Freq: Three times a day (TID) | SUBCUTANEOUS | Status: DC
  Administered 2014-03-11: 3 [IU] via SUBCUTANEOUS
  Administered 2014-03-11 – 2014-03-12 (×2): 2 [IU] via SUBCUTANEOUS
  Administered 2014-03-12: 1 [IU] via SUBCUTANEOUS
  Administered 2014-03-12: 2 [IU] via SUBCUTANEOUS
  Administered 2014-03-13 (×2): 1 [IU] via SUBCUTANEOUS
  Administered 2014-03-13 – 2014-03-14 (×2): 2 [IU] via SUBCUTANEOUS
  Administered 2014-03-15: 1 [IU] via SUBCUTANEOUS
  Administered 2014-03-15 – 2014-03-16 (×2): 2 [IU] via SUBCUTANEOUS
  Administered 2014-03-17: 3 [IU] via SUBCUTANEOUS

## 2014-03-11 MED ORDER — ONDANSETRON HCL 4 MG PO TABS: 4.0000 mg | ORAL_TABLET | Freq: Four times a day (QID) | ORAL | Status: DC | PRN

## 2014-03-11 MED ORDER — ALBUTEROL SULFATE (2.5 MG/3ML) 0.083% IN NEBU: 2.5000 mg | INHALATION_SOLUTION | Freq: Three times a day (TID) | RESPIRATORY_TRACT | Status: DC

## 2014-03-11 MED ORDER — IPRATROPIUM BROMIDE 0.02 % IN SOLN: 0.5000 mg | Freq: Four times a day (QID) | RESPIRATORY_TRACT | Status: DC

## 2014-03-11 MED ORDER — ONDANSETRON HCL 4 MG/2ML IJ SOLN: 4.0000 mg | Freq: Four times a day (QID) | INTRAMUSCULAR | Status: DC | PRN

## 2014-03-11 MED ORDER — ACETAMINOPHEN 325 MG PO TABS
650.0000 mg | ORAL_TABLET | Freq: Four times a day (QID) | ORAL | Status: DC | PRN
  Administered 2014-03-11: 650 mg via ORAL
  Filled 2014-03-11 (×2): qty 2

## 2014-03-11 MED ORDER — FAMOTIDINE 20 MG PO TABS
20.0000 mg | ORAL_TABLET | Freq: Two times a day (BID) | ORAL | Status: DC
  Administered 2014-03-11 – 2014-03-20 (×19): 20 mg via ORAL
  Filled 2014-03-11 (×19): qty 1

## 2014-03-11 MED ORDER — ALBUTEROL SULFATE (2.5 MG/3ML) 0.083% IN NEBU: 2.5000 mg | INHALATION_SOLUTION | Freq: Four times a day (QID) | RESPIRATORY_TRACT | Status: DC

## 2014-03-11 MED ORDER — ALBUTEROL SULFATE (2.5 MG/3ML) 0.083% IN NEBU: 2.5000 mg | INHALATION_SOLUTION | RESPIRATORY_TRACT | Status: DC

## 2014-03-11 MED ORDER — INSULIN ASPART 100 UNIT/ML ~~LOC~~ SOLN
0.0000 [IU] | Freq: Every day | SUBCUTANEOUS | Status: DC
  Administered 2014-03-14: 2 [IU] via SUBCUTANEOUS

## 2014-03-11 MED ORDER — BISACODYL 10 MG RE SUPP
10.0000 mg | Freq: Once | RECTAL | Status: AC
  Administered 2014-03-11: 10 mg via RECTAL
  Filled 2014-03-11: qty 1

## 2014-03-11 MED ORDER — SODIUM CHLORIDE 0.9 % IJ SOLN
3.0000 mL | Freq: Two times a day (BID) | INTRAMUSCULAR | Status: DC
  Administered 2014-03-11 – 2014-03-19 (×12): 3 mL via INTRAVENOUS

## 2014-03-11 MED ORDER — ENOXAPARIN SODIUM 80 MG/0.8ML ~~LOC~~ SOLN
1.0000 mg/kg | Freq: Two times a day (BID) | SUBCUTANEOUS | Status: AC
  Administered 2014-03-11 – 2014-03-12 (×5): 80 mg via SUBCUTANEOUS
  Filled 2014-03-11 (×5): qty 0.8

## 2014-03-11 MED ORDER — IPRATROPIUM-ALBUTEROL 0.5-2.5 (3) MG/3ML IN SOLN: 3.0000 mL | Freq: Two times a day (BID) | RESPIRATORY_TRACT | Status: DC

## 2014-03-11 NOTE — Consult Note (Signed)
Inpatient Hematology/Oncology Consultation   Name: Bryce Mcdonald      MRN: [id number]    Location: W098/J191-47  Date: 03/11/2014 Time:4:36 PM   REFERRING PHYSICIAN:  Dyanne Carrel, NP  REASON FOR CONSULT:  Easier anticoagulation options   DIAGNOSIS:  Afib, history of PE  HISTORY OF PRESENT ILLNESS:   81 year old male with a reported history of blood clots dating back to 1965.  He presents to [facility] on 03/10/2014 with SOB. He was diagnosed in December 2015 with a nonocclusive RLL segmental PE.    On review of the patients chart: 1. He was seen by Dr. Jacinta Shoe on 9/25 and advised to continue XARELTO for his chronic afib 2. He presented to the ED with flank and abdominal pain.  XARELTO was not on his outpatient med list but Lovenox at a 143m daily dose. If he was on that for DVT or even cardiac reasons was suboptimal dosing based upon his body weight. 3. He presented to ED on 12/25 with neck pain and syncope, CTA chest showed RLL PE.  It was recommended that he increase his lovenox to appropriate bid dosing. (Note the patient also admitted to missing several injections the weeks before his admission) an anti-Xa level was mentioned but not ordered.   Patient reports the VBaxterstopped his XARELTO.  He does not know why.  [place]  He has a hard time tolerating the injections.  He states his legs and stomach are sore and bruised.    PAST MEDICAL HISTORY:   Past Medical History  Diagnosis Date  . Colon cancer     Status post partial colectomy  . Hypertension   . Hypercholesterolemia   . Blood transfusion   . DVT (deep venous thrombosis)     a. Prior h/o such. b. Recurrent subacute bilat DVT 2014.  . PE (pulmonary embolism)   . Diabetes mellitus   . Chronic atrial fibrillation   . Diabetes mellitus, type II   . Ventral hernia   . Unspecified hypothyroidism   . LVH (left ventricular hypertrophy)   . Bilateral renal cysts   . Calculus of left kidney    non-obstructing  . COPD (chronic obstructive pulmonary disease)   . Chronic combined systolic and diastolic CHF (congestive heart failure)     a. Echo 07/2013: EF 40-50%, mod LVH, mild MR, mildly dilated LA, mild-mod TR, PA pressure 672mg.  . Marland KitchenBBB with left anterior fascicular block   . Bradycardia   . Pulmonary hypertension     a. Echo 07/2013: PASP 6016m.  . Mild mitral regurgitation     a. By echo 07/2013.  . Tricuspid regurgitation     a. Mild-mod by echo 07/2013.  . Orthostatic hypotension   . Dementia   . CAD (coronary artery disease)     a. Nonobstructive by cath in 2008. b. Nuc 08/2013: low to intermediate risk. Imaging c/w scar in the inferior wall without any large ischemic territories. LVEF 37% with fairly diffuse hypokinesis.   . Syncope   . Ventral hernia   . Pulmonary nodules   . CKD (chronic kidney disease), stage III     a. based on historical lab data.    ALLERGIES: Allergies  Allergen Reactions  . Codeine Shortness Of Breath    Shortness of breath  . Penicillins Shortness Of Breath  . Morphine And Related Itching      MEDICATIONS: I have reviewed the patient's current medications.  PAST SURGICAL HISTORY Past Surgical History  Procedure Laterality Date  . Abdominal surgery      x 3  . Cardiac catheterization  2008    FAMILY HISTORY: Family History  Problem Relation Age of Onset  . Hypertension      SOCIAL HISTORY:  reports that he quit smoking about 41 years ago. His smoking use included Cigarettes. He started smoking about 70 years ago. He smoked 0.50 packs per day. He has quit using smokeless tobacco. His smokeless tobacco use included Chew. He reports that he does not drink alcohol or use illicit drugs.  PERFORMANCE STATUS: The patient's performance status is 1 - Symptomatic but completely ambulatory  PHYSICAL EXAM: Most Recent Vital Signs: Blood pressure 143/78, pulse 83, temperature 97.7 F (36.5 C), temperature source Oral, resp. rate 20,  height _0  (1.727 m), weight 189 lb (85.73 kg), SpO2 98 %. General appearance: alert, cooperative and no distress Head: Normocephalic, without obvious abnormality, atraumatic Eyes: conjunctivae/corneas clear. PERRL, EOM's intact.  Neck: no adenopathy, no carotid bruit, no JVD, supple, symmetrical, trachea midline and thyroid not enlarged, symmetric, no tenderness/mass/nodules Lungs: rhonchi base - right Heart: irregularly irregular rhythm Abdomen: soft, non-tender; bowel sounds normal; no masses,  no organomegaly Skin: Skin color, texture, turgor normal. No rashes or lesions or ecchymoses - scattered Neurologic: Grossly normal  LABORATORY DATA:  Results for orders placed or performed during the hospital encounter of 03/10/14 (from the past 48 hour(s))  Troponin I     Status: Abnormal   Collection Time: 03/10/14 11:01 PM  Result Value Ref Range   Troponin I 0.17 (H) <0.031 ng/mL    Comment:        PERSISTENTLY INCREASED TROPONIN VALUES IN THE RANGE OF 0.04-0.49 ng/mL CAN BE SEEN IN:       -UNSTABLE ANGINA       -CONGESTIVE HEART FAILURE       -MYOCARDITIS       -CHEST TRAUMA       -ARRYHTHMIAS       -LATE PRESENTING MYOCARDIAL INFARCTION       -COPD   CLINICAL FOLLOW-UP RECOMMENDED. Please note change in reference range.   CBC     Status: Abnormal   Collection Time: 03/10/14 11:01 PM  Result Value Ref Range   WBC 4.0 4.0 - 10.5 K/uL   RBC 2.91 (L) 4.22 - 5.81 MIL/uL   Hemoglobin 10.0 (L) 13.0 - 17.0 g/dL   HCT 30.5 (L) 39.0 - 52.0 %   MCV 104.8 (H) 78.0 - 100.0 fL   MCH 34.4 (H) 26.0 - 34.0 pg   MCHC 32.8 30.0 - 36.0 g/dL   RDW 15.5 11.5 - 15.5 %   Platelets 138 (L) 150 - 400 K/uL  Basic metabolic panel     Status: Abnormal   Collection Time: 03/10/14 11:01 PM  Result Value Ref Range   Sodium 139 135 - 145 mmol/L    Comment: Please note change in reference range.   Potassium 4.5 3.5 - 5.1 mmol/L    Comment: Please note change in reference range.   Chloride 106 96 -  112 mEq/L   CO2 25 19 - 32 mmol/L   Glucose, Bld 155 (H) 70 - 99 mg/dL   BUN 31 (H) 6 - 23 mg/dL   Creatinine, Ser 1.18 0.50 - 1.35 mg/dL   Calcium 9.7 8.4 - 10.5 mg/dL   GFR calc non Af Amer 52 (L) >90 mL/min   GFR calc Af Amer 61 (L) >  90 mL/min    Comment: (NOTE) The eGFR has been calculated using the CKD EPI equation. This calculation has not been validated in all clinical situations. eGFR's persistently <90 mL/min signify possible Chronic Kidney Disease.    Anion gap 8 5 - 15  Brain natriuretic peptide     Status: Abnormal   Collection Time: 03/11/14 12:05 AM  Result Value Ref Range   B Natriuretic Peptide 560.0 (H) 0.0 - 100.0 pg/mL    Comment: Please note change in reference range.  Blood gas, arterial ([facility] & [facility] ONLY)     Status: Abnormal   Collection Time: 03/11/14  1:30 AM  Result Value Ref Range   O2 Content 2.0 L/min   Delivery systems NASAL CANNULA    pH, Arterial 7.375 7.350 - 7.450   pCO2 arterial 35.1 35.0 - 45.0 mmHg   pO2, Arterial 101.0 (H) 80.0 - 100.0 mmHg   Bicarbonate 20.1 20.0 - 24.0 mEq/L   TCO2 18.2 0 - 100 mmol/L   Acid-base deficit 4.3 (H) 0.0 - 2.0 mmol/L   O2 Saturation 97.2 %   Patient temperature 37.0    Collection site RIGHT RADIAL    Drawn by [id number]    Sample type ARTERIAL    Allens test (pass/fail) PASS PASS  Troponin I     Status: Abnormal   Collection Time: 03/11/14  2:18 AM  Result Value Ref Range   Troponin I 0.18 (H) <0.031 ng/mL    Comment:        PERSISTENTLY INCREASED TROPONIN VALUES IN THE RANGE OF 0.04-0.49 ng/mL CAN BE SEEN IN:       -UNSTABLE ANGINA       -CONGESTIVE HEART FAILURE       -MYOCARDITIS       -CHEST TRAUMA       -ARRYHTHMIAS       -LATE PRESENTING MYOCARDIAL INFARCTION       -COPD   CLINICAL FOLLOW-UP RECOMMENDED. Please note change in reference range.   CBC     Status: Abnormal   Collection Time: 03/11/14  3:06 AM  Result Value Ref Range   WBC 3.8 (L) 4.0 - 10.5 K/uL   RBC 3.02 (L) 4.22 - 5.81 MIL/uL     Hemoglobin 10.3 (L) 13.0 - 17.0 g/dL   HCT 31.7 (L) 39.0 - 52.0 %   MCV 105.0 (H) 78.0 - 100.0 fL   MCH 34.1 (H) 26.0 - 34.0 pg   MCHC 32.5 30.0 - 36.0 g/dL   RDW 15.6 (H) 11.5 - 15.5 %   Platelets 141 (L) 150 - 400 K/uL  Comprehensive metabolic panel     Status: Abnormal   Collection Time: 03/11/14  3:06 AM  Result Value Ref Range   Sodium 139 135 - 145 mmol/L    Comment: Please note change in reference range.   Potassium 4.5 3.5 - 5.1 mmol/L    Comment: Please note change in reference range.   Chloride 107 96 - 112 mEq/L   CO2 24 19 - 32 mmol/L   Glucose, Bld 178 (H) 70 - 99 mg/dL   BUN 29 (H) 6 - 23 mg/dL   Creatinine, Ser 1.16 0.50 - 1.35 mg/dL   Calcium 9.5 8.4 - 10.5 mg/dL   Total Protein 7.0 6.0 - 8.3 g/dL   Albumin 4.3 3.5 - 5.2 g/dL   AST 53 (H) 0 - 37 U/L   ALT 74 (H) 0 - 53 U/L   Alkaline Phosphatase 89 39 - 117 U/L  Total Bilirubin 0.5 0.3 - 1.2 mg/dL   GFR calc non Af Amer 54 (L) >90 mL/min   GFR calc Af Amer 62 (L) >90 mL/min    Comment: (NOTE) The eGFR has been calculated using the CKD EPI equation. This calculation has not been validated in all clinical situations. eGFR's persistently <90 mL/min signify possible Chronic Kidney Disease.    Anion gap 8 5 - 15  Troponin I     Status: Abnormal   Collection Time: 03/11/14  7:47 AM  Result Value Ref Range   Troponin I 0.18 (H) <0.031 ng/mL    Comment:        PERSISTENTLY INCREASED TROPONIN VALUES IN THE RANGE OF 0.04-0.49 ng/mL CAN BE SEEN IN:       -UNSTABLE ANGINA       -CONGESTIVE HEART FAILURE       -MYOCARDITIS       -CHEST TRAUMA       -ARRYHTHMIAS       -LATE PRESENTING MYOCARDIAL INFARCTION       -COPD   CLINICAL FOLLOW-UP RECOMMENDED. Please note change in reference range.   Glucose, capillary     Status: Abnormal   Collection Time: 03/11/14 11:35 AM  Result Value Ref Range   Glucose-Capillary 233 (H) 70 - 99 mg/dL   Comment 1 Notify RN   Troponin I     Status: Abnormal   Collection  Time: 03/11/14  2:18 PM  Result Value Ref Range   Troponin I 0.17 (H) <0.031 ng/mL    Comment:        PERSISTENTLY INCREASED TROPONIN VALUES IN THE RANGE OF 0.04-0.49 ng/mL CAN BE SEEN IN:       -UNSTABLE ANGINA       -CONGESTIVE HEART FAILURE       -MYOCARDITIS       -CHEST TRAUMA       -ARRYHTHMIAS       -LATE PRESENTING MYOCARDIAL INFARCTION       -COPD   CLINICAL FOLLOW-UP RECOMMENDED. Please note change in reference range.   Reticulocytes     Status: Abnormal   Collection Time: 03/11/14  2:18 PM  Result Value Ref Range   Retic Ct Pct 3.6 (H) 0.4 - 3.1 %   RBC. 2.96 (L) 4.22 - 5.81 MIL/uL   Retic Count, Manual 106.6 19.0 - 186.0 K/uL      RADIOGRAPHY: Ct Angio Chest Pe W/cm &/or Wo Cm  03/11/2014   CLINICAL DATA:  Shortness of breath.  Recent pulmonary embolism.  EXAM: CT ANGIOGRAPHY CHEST WITH CONTRAST  IMPRESSION: 1. No acute or chronic pulmonary embolism. 2. Non enhancement of the left atrial appendage which could be delayed mixing or thrombus in this patient with history of atrial fibrillation. 3. Trace bilateral pleural effusion. 4. Emphysema.   Electronically Signed   By: Jorje Guild M.D.   On: 03/11/2014 00:07       PATHOLOGY:  None  ASSESSMENT/PLAN:  81 yo AA man who is admitted to the hospital and hematology is consulted to request anticoagulation options.  Previous notes from Cardiology (Dr. Jacinta Shoe) in Sept 2015 noted he was to continue Xarelto for A-fib.  At some time in October, he re-presents to the hospital and he is noted to be on 100 mg of Lovenox daily which was suboptimal dosing. The patient hinks he was switched by the [facility] physicians for unknown reason at this time.  We will attempt to get his records from the [place] in [place],  [place].  He was readmitted in December 2015, diagnosed with a PE and Hematology was consulted.  Noting that he was on sub-optimal dose of Lovenox, he was started on 100 mg of Lovenox BID which is therapeutic dosing.  Today, we are  consulted because the patient lives alone and he is having difficulty self administering Lovenox.  Therefore, we recommend the patient go back to a direct Xa inhibitor such as Xarelto/Eliquis.  Will defer dosing to Cardiology since he has A-fib.  Other medication that can be used is a Vitamin-K antagonist with INR monitoring to help verify compliance.  On chart review, it is noted that the patient has been admitted on multiple occassions over the past 3 months.  We would question the utility of assisted living or SNF in this patient's care.  This would also help with compliance of medications.  All questions were answered. The patient knows to call the clinic with any problems, questions or concerns. We can certainly see the patient much sooner if necessary. Ilea Hilton [employer]

## 2014-03-11 NOTE — Evaluation (Signed)
Physical Therapy Evaluation Patient Details Name: Bryce Mcdonald MRN: [id number] DOB: 09/22/1921 Today's Date: 03/11/2014   History of Present Illness   81 year old male who  has a past medical history of Colon cancer; Hypertension; Hypercholesterolemia; Blood transfusion; DVT (deep venous thrombosis); PE (pulmonary embolism); Diabetes mellitus; Chronic atrial fibrillation; Diabetes mellitus, type II; Ventral hernia; Unspecified hypothyroidism; LVH (left ventricular hypertrophy); Bilateral renal cysts; Calculus of left kidney; COPD (chronic obstructive pulmonary disease); Chronic combined systolic and diastolic CHF (congestive heart failure); RBBB with left anterior fascicular block; Bradycardia; Pulmonary hypertension; Mild mitral regurgitation; Tricuspid regurgitation; Orthostatic hypotension; Dementia; CAD (coronary artery disease); Syncope; Ventral hernia; Pulmonary nodules; and CKD (chronic kidney disease), stage III.  Patient was recently discharged from hospital on 02/17/2014 after he was diagnosed with pulmonary embolism, patient was discharged on Lovenox injection twice a day. Yesterday patient came to the ED with shortness of breath and was discharged home with prednisone and nebulizer treatment for COPD exacerbation. As per patient he has been getting worsening of shortness of breath on exertion, and today also complains of lower abdominal pain. Patient was found on the floor at the group home, though he denies passing out. Patient says that he hasn't moved his bowels for past 3 days.  Clinical Impression  Pt is a 81 year old male who presents to PT with dx of hypoxia.  O2 sats assessed during PT evaluation, with sats at 90% in sitting and 79 after gait of 75 feet.  Pt reported increased fatigue with gait and distance limited.  Daughter present for part of evaluation and reports pt has a lot of family/aides nearby and are able to assist 24/7 if needed.  Recommend continued PT in the hospital to  address strengthening and activity tolerance for increasing mobility skills, with transition to HHPT at discharge.  No DME recommendations.     Follow Up Recommendations Home health PT    Equipment Recommendations  None recommended by PT       Precautions / Restrictions Precautions Precautions: Fall Restrictions Weight Bearing Restrictions: No      Mobility  Bed Mobility Overal bed mobility: Modified Independent                Transfers Overall transfer level: Needs assistance Equipment used: Rolling walker (2 wheeled) Transfers: Sit to/from Stand Sit to Stand: Supervision            Ambulation/Gait Ambulation/Gait assistance: Modified independent (Device/Increase time) Ambulation Distance (Feet): 75 Feet Assistive device: Rolling walker (2 wheeled) Gait Pattern/deviations: Step-through pattern;Trunk flexed;Decreased dorsiflexion - right;Decreased dorsiflexion - left   Gait velocity interpretation: at or above normal speed for age/gender        Balance Overall balance assessment: No apparent balance deficits (not formally assessed)                                           Pertinent Vitals/Pain Pain Assessment: No/denies pain Pain Score: 1  Pain Location: Stomach (given stool softener last night and on BSC all night) Pain Intervention(s): Limited activity within patient's tolerance;Monitored during session;Repositioned    Home Living Family/patient expects to be discharged to:: Private residence Living Arrangements: Alone Available Help at Discharge: Family;Personal care attendant;Available 24 hours/day Type of Home: Apartment (1st floor) Home Access: Level entry     Home Layout: One level Home Equipment: Grab bars - toilet;Grab bars - tub/shower;Walker - 2 wheels  Prior Function Level of Independence: Independent with assistive device(s)               Hand Dominance   Dominant Hand: Right    Extremity/Trunk  Assessment               Lower Extremity Assessment: Generalized weakness         Communication   Communication: No difficulties  Cognition Arousal/Alertness: Awake/alert Behavior During Therapy: WFL for tasks assessed/performed Overall Cognitive Status: Within Functional Limits for tasks assessed                               Assessment/Plan    PT Assessment Patient needs continued PT services  PT Diagnosis Difficulty walking;Generalized weakness   PT Problem List Decreased strength;Decreased activity tolerance;Decreased mobility;Decreased balance  PT Treatment Interventions DME instruction;Gait training;Neuromuscular re-education;Balance training;Stair training;Functional mobility training;Therapeutic activities;Therapeutic exercise;Patient/family education   PT Goals (Current goals can be found in the Care Plan section) Acute Rehab PT Goals Patient Stated Goal: go home PT Goal Formulation: With patient Time For Goal Achievement: 03/25/14 Potential to Achieve Goals: Good    Frequency Min 3X/week    End of Session Equipment Utilized During Treatment: Gait belt Activity Tolerance: Patient limited by fatigue Patient left: in bed;with call bell/phone within reach (Sitting EOB with RN to get blood sugar) Nurse Communication: Mobility status    Functional Assessment Tool Used: Clinical Judgment Functional Limitation: Mobility: Walking and moving around Mobility: Walking and Moving Around Current Status (T2549): At least 20 percent but less than 40 percent impaired, limited or restricted Mobility: Walking and Moving Around Goal Status [phone]): At least 1 percent but less than 20 percent impaired, limited or restricted    Time: 1120-1136 PT Time Calculation (min) (ACUTE ONLY): 16 min   Charges:   PT Evaluation $Initial PT Evaluation Tier I: 1 Procedure     PT G Codes:   PT G-Codes **NOT FOR INPATIENT CLASS** Functional Assessment Tool Used: Clinical  Judgment Functional Limitation: Mobility: Walking and moving around Mobility: Walking and Moving Around Current Status (R8309): At least 20 percent but less than 40 percent impaired, limited or restricted Mobility: Walking and Moving Around Goal Status [phone]): At least 1 percent but less than 20 percent impaired, limited or restricted    Lonna Cobb, DPT [phone]  03/11/2014, 12:06 PM

## 2014-03-11 NOTE — Progress Notes (Signed)
[facility] PROGRESS NOTE  Eulises Kijowski URK:[id number] DOB: [birth year] DOA: 03/10/2014 PCP: PROVIDER NOT IN SYSTEM  Assessment/Plan: Dyspnea on exertion Secondary to COPD exacerbation. Some improvement this am.  Continue DuoNeb nebulizer but increase to  every 4 hours with Solu-Medrol 60 mg IV every 6 hours. Continues with decreased BS. No wheeze  COPD exacerbation: on home oxygen. Trigger unclear. See above   ? Left atrial thrombus CT angiogram raise the possibility of left atrial thrombus. await cardiology input. await 2-D echocardiogram. patient already is on Lovenox 80 mg twice a day which was prescribed on 02/17/2014 for pulmonary embolism.  Recent history of pulmonary embolism In the previous admission on 02/13/2014, CT angiogram showed nonocclusive right lower lobe segmental pulmonary embolism. At that time patient was discharged on Lovenox. CT angiogram does not show either acute or chronic pulmonary embolism. Chart review reveals evaluated by Heme on 12/27 who recommended lovenox for life due to "extensive clotting history". Will request heme consult for recommendations on alternative management.   Constipation  no BM for several days. Continue dulcolax suppository when necessary  Elevated troponin Patient has mild yet stable elevation of troponin 0.18.  Likely due to demand ischemia from A. fib. Rate remains controlled. No chest pain. EKG with no acute changes.  Await cardiology consult  Diabetes II: appears diet controlled. CBG trending up likely related to steroids. Will obtain A1c. Use SSI for optimal control.   Anemia: likely related to chronic disease. Chart review indicates current hg below baseline. Will obtain FOBT and anemia panel.    Code Status: full Family Communication: none present Disposition Plan: home when ready   Consultants:  Cardiology  Heme  Procedures:  none  Antibiotics:  none  HPI/Subjective: Reports improved breathing.  Reports compliance with meds.   Objective: Filed Vitals:   03/11/14 0838  BP:   Pulse: 59  Temp:   Resp: 18    Intake/Output Summary (Last 24 hours) at 03/11/14 1054 Last data filed at 03/11/14 0800  Gross per 24 hour  Intake      0 ml  Output    100 ml  Net   -100 ml   Filed Weights   03/10/14 2232 03/11/14 0249  Weight: 82.555 kg (182 lb) 85.73 kg (189 lb)    Exam:   General:  Well nourished appears comfortable  Cardiovascular: irregularly irregular no MGR  Respiratory: normal effort BS diminished no wheeze decreased air flow  Abdomen: soft +BS   Musculoskeletal: no clubbing or cyanosis   Data Reviewed: Basic Metabolic Panel:  Recent Labs Lab 03/09/14 1158 03/10/14 2301 03/11/14 0306  NA 140 139 139  K 4.0 4.5 4.5  CL 107 106 107  CO2 26 25 24   GLUCOSE 117* 155* 178*  BUN 26* 31* 29*  CREATININE 1.27 1.18 1.16  CALCIUM 9.7 9.7 9.5   Liver Function Tests:  Recent Labs Lab 03/09/14 1158 03/11/14 0306  AST 42* 53*  ALT 59* 74*  ALKPHOS 101 89  BILITOT 0.7 0.5  PROT 7.8 7.0  ALBUMIN 4.8 4.3   No results for input(s): LIPASE, AMYLASE in the last 168 hours. No results for input(s): AMMONIA in the last 168 hours. CBC:  Recent Labs Lab 03/09/14 1158 03/10/14 2301 03/11/14 0306  WBC 5.5 4.0 3.8*  NEUTROABS 2.4  --   --   HGB 11.7* 10.0* 10.3*  HCT 35.9* 30.5* 31.7*  MCV 104.7* 104.8* 105.0*  PLT 153 138* 141*   Cardiac Enzymes:  Recent Labs Lab 03/09/14  1158 03/10/14 2301 03/11/14 0218 03/11/14 0747  TROPONINI 0.18* 0.17* 0.18* 0.18*   BNP (last 3 results)  Recent Labs  07/26/13 1128 11/16/13 0839 01/10/14 1046  PROBNP 2036.0* 770.7* 1366.0*   CBG: No results for input(s): GLUCAP in the last 168 hours.  No results found for this or any previous visit (from the past 240 hour(s)).   Studies: Dg Chest 2 View  03/09/2014   CLINICAL DATA:  Three-day history of difficulty breathing. History of emphysema  EXAM: CHEST  2  VIEW  COMPARISON:  Chest radiograph February 13, 2014; chest CT February 13, 2014  FINDINGS: There is underlying emphysema. There is scarring in the right lower lobe which appears stable. There is no frank edema or consolidation. The heart is mildly enlarged with pulmonary vascularity within normal limits. Is atherosclerotic change in aorta. No adenopathy. No bone lesions.  IMPRESSION: Underlying emphysema. Scarring right lower lobe, stable. No frank edema or consolidation. Heart prominent but stable.   Electronically Signed   By: Lowella Grip M.D.   On: 03/09/2014 11:47   Ct Angio Chest Pe W/cm &/or Wo Cm  03/11/2014   CLINICAL DATA:  Shortness of breath.  Recent pulmonary embolism.  EXAM: CT ANGIOGRAPHY CHEST WITH CONTRAST  TECHNIQUE: Multidetector CT imaging of the chest was performed using the standard protocol during bolus administration of intravenous contrast. Multiplanar CT image reconstructions and MIPs were obtained to evaluate the vascular anatomy.  CONTRAST:  152mL OMNIPAQUE IOHEXOL 350 MG/ML SOLN  COMPARISON:  02/13/2014  FINDINGS: THORACIC INLET/BODY WALL:  No acute abnormality.  MEDIASTINUM:  Normal heart size. No pericardial effusion. There is atherosclerosis, including the coronary arteries. A small persistent left SVC is incidentally noted. There is non opacification of the left atrial appendage. Although there is early opacification of the left heart, this was also seen previously. Noted history of atrial fibrillation. No acute or chronic pulmonary embolism. No evidence of acute aortic pathology, although there is no enhancement within the systemic arterial tree.  LUNG WINDOWS:  Centrilobular emphysema which is mild. Bibasilar scarring which is stable. No edema or pneumonia. Trace bilateral pleural effusion.  There are multiple small scattered pulmonary nodules that are peripheral. These are stable from 2014 at least, including the largest within the right middle lobe at 5 mm (image 50).   UPPER ABDOMEN:  No acute findings.  OSSEOUS:  No acute fracture.  No suspicious lytic or blastic lesions.  Review of the MIP images confirms the above findings.  IMPRESSION: 1. No acute or chronic pulmonary embolism. 2. Non enhancement of the left atrial appendage which could be delayed mixing or thrombus in this patient with history of atrial fibrillation. 3. Trace bilateral pleural effusion. 4. Emphysema.   Electronically Signed   By: Jorje Guild M.D.   On: 03/11/2014 00:07    Scheduled Meds: . albuterol  2.5 mg Nebulization TID  . aspirin  81 mg Oral Daily  . atorvastatin  80 mg Oral q1800  . enoxaparin  1 mg/kg Subcutaneous Q12H  . famotidine  20 mg Oral BID  . levothyroxine  50 mcg Oral QAC breakfast  . methylPREDNISolone (SOLU-MEDROL) injection  60 mg Intravenous Q6H  . sodium chloride  3 mL Intravenous Q12H  . sodium chloride  3 mL Intravenous Q12H   Continuous Infusions:   Active Problems:   HTN (hypertension)   Type 2 diabetes mellitus   Dyspnea   Chronic combined systolic and diastolic CHF (congestive heart failure)   COPD (chronic obstructive  pulmonary disease)   Hypertension   CKD (chronic kidney disease), stage III   Hypoxia    Time spent: 40 minutes    [facility] Pager [phone]. If 7PM-7AM, please contact night-coverage at [email or web address], password [facility] 03/11/2014, 10:54 AM  LOS: 1 day

## 2014-03-11 NOTE — Care Management Note (Addendum)
    Page 1 of 2   03/20/2014     1:53:24 PM CARE MANAGEMENT NOTE 03/20/2014  Patient:  Bryce Mcdonald, Bryce Mcdonald   Account Number:  [id number]  Date Initiated:  03/11/2014  Documentation initiated by:  Theophilus Kinds  Subjective/Objective Assessment:   Pt admitted from home with COPD. Pt lives alone in an apartment. Pt has a walker for home  use and has an aide that comes in a couple of hours a day. Pt has given CM permission to call pts daughter and discuss discharge plans.     Action/Plan:   Left vm for pts daughter. Pt does go to [facility] for PCP care. Pt may need home O2 at discharge. Staff to check pts O2 sats so if qualifies [facility] can arrange prior to discharge. ? pt active with [facility]   Anticipated DC Date:  03/14/2014   Anticipated DC Plan:  [place]  CM consult      [facility]  Resumption Of Svcs/PTA Provider   Choice offered to / List presented to:  C-1 Patient   DME arranged  [facility]      DME agency  [place] arranged  HH-1 RN  HH-10 DISEASE MANAGEMENT  HH-2 PT      [place] agency  [facility]   Status of service:  Completed, signed off Medicare Important Message given?   (If response is "NO", the following Medicare IM given date fields will be blank) Date Medicare IM given:   Medicare IM given by:   Date Additional Medicare IM given:   Additional Medicare IM given by:    Discharge Disposition:  [place]  Per UR Regulation:    If discussed at Long Length of Stay Meetings, dates discussed:   03/17/2014  03/19/2014    Comments:  03/20/14 1345 Christinia Gully, RN BSN CM Pt to be discharged home today with resumption of [facility] (per arrangements from [place]). Home health orders faxed to [facility] and [facility] services to start within 48 hours of discharge. Pts daughter is going to the [employer] to pick up pts neb machine and  medications. Prescriptions and MD documentation for neb machine faxed to pts PCP Dr. Berline Lopes at the [facility]. Also faxed orders for hospital bed and BSC. Family is requesting increase in hours for pts caregiver and that documentation faxed to [facility] as well. Pts daughter will retrieve pt once all meds and DME is obtained from [place]. Pt already has home O2 in place by [facility] as arranged by [place]. Pt and pts nurse aware of discharge arrangements.   03/13/14 [place], RN BSN CM Anticipate discharge over the weekend with resumption of [place] and add PT. CM called PCP at [facility] to approve PT services but VM had to be left with no return phone call at this time. Will continue to follow up that service. Home O2 arranged and delivered to pts home by [place]. Pt and pts nurse aware of discharge arrangements.  03/11/14 [place], RN BSN CM CM contacted Ardis Rowan at [facility] to begin home O2 process as pt qualifies. Had to leave vm. Will continue to follow up with [facility].  03/11/14 [place], RN BSN CM

## 2014-03-11 NOTE — Consult Note (Signed)
Reason for Consult:? LAA thrombus on CT angio Referring Physician: PTH Cardiologist: Bryce Mcdonald is an 81 y.o. male. patient who we saw in the hospital 01/2014 for repeat current pulmonary embolus and chronic atrial fibrillation. He was sent home on Lovenox subcutaneous twice a day based on consultation with Hematology. 2-D echo on 02/15/14 showed normal LV function EF 50-55% with high ventricular filling pressures, mild MR LA was moderately dilated, moderate LVH and mildly reduced RV function with mild MR and TR. Patient has a history of nonobstructive CAD by cath in 2008. Low to intermediate risk nuclear scan in 08/2013 with images consistent with scar in the inferior wall without any large ischemic territories, EF 37% with fairly diffuse hypokinesis. The patient has had chronic systolic and diastolic heart failure. He's had long history of blood clots dating back to 1965.  Yesterday patient was walking down the hall of his apartment and had sudden onset of shortness of breath, became dizzy, fell to his knees and loss control of his bladder. He was able to make it back to  His room to change and son brought patient to the emergency room with worsening dyspnea and was started on prednisone and nebulizer treatment for COPD exacerbation. His shortness of breath worsened and was associated with lower abdominal pain and returned to the ER. CT angiogram of the chest did not show any acute or chronic pulmonary embolus but showed no enhancement of the left atrial appendage which could be delayed mixing or thrombus. He also had a mildly elevated troponin. His O2 sats dropped to 80 and he is not on home oxygen.Troponins have been elevated since PE in 01/2014. Marland Kitchen0.18 x 3. BNP 560.    Past Medical History  Diagnosis Date  . Colon cancer     Status post partial colectomy  . Hypertension   . Hypercholesterolemia   . Blood transfusion   . DVT (deep venous thrombosis)     a. Prior h/o such.  b. Recurrent subacute bilat DVT 2014.  . PE (pulmonary embolism)   . Diabetes mellitus   . Chronic atrial fibrillation   . Diabetes mellitus, type II   . Ventral hernia   . Unspecified hypothyroidism   . LVH (left ventricular hypertrophy)   . Bilateral renal cysts   . Calculus of left kidney     non-obstructing  . COPD (chronic obstructive pulmonary disease)   . Chronic combined systolic and diastolic CHF (congestive heart failure)     a. Echo 07/2013: EF 40-50%, mod LVH, mild MR, mildly dilated LA, mild-mod TR, PA pressure 43mHg.  .Marland KitchenRBBB with left anterior fascicular block   . Bradycardia   . Pulmonary hypertension     a. Echo 07/2013: PASP 661mg.  . Mild mitral regurgitation     a. By echo 07/2013.  . Tricuspid regurgitation     a. Mild-mod by echo 07/2013.  . Orthostatic hypotension   . Dementia   . CAD (coronary artery disease)     a. Nonobstructive by cath in 2008. b. Nuc 08/2013: low to intermediate risk. Imaging c/w scar in the inferior wall without any large ischemic territories. LVEF 37% with fairly diffuse hypokinesis.   . Syncope   . Ventral hernia   . Pulmonary nodules   . CKD (chronic kidney disease), stage III     a. based on historical lab data.    Past Surgical History  Procedure Laterality Date  . Abdominal surgery  x 3  . Cardiac catheterization  2008    Family History  Problem Relation Age of Onset  . Hypertension      Social History:  reports that he quit smoking about 41 years ago. His smoking use included Cigarettes. He started smoking about 70 years ago. He smoked 0.50 packs per day. He has quit using smokeless tobacco. His smokeless tobacco use included Chew. He reports that he does not drink alcohol or use illicit drugs.  Allergies:  Allergies  Allergen Reactions  . Codeine Shortness Of Breath    Shortness of breath  . Penicillins Shortness Of Breath  . Morphine And Related Itching    Medications:  Scheduled Meds: . albuterol  2.5 mg  Nebulization TID  . aspirin  81 mg Oral Daily  . atorvastatin  80 mg Oral q1800  . enoxaparin  1 mg/kg Subcutaneous Q12H  . famotidine  20 mg Oral BID  . insulin aspart  0-5 Units Subcutaneous QHS  . insulin aspart  0-9 Units Subcutaneous TID WC  . levothyroxine  50 mcg Oral QAC breakfast  . methylPREDNISolone (SOLU-MEDROL) injection  60 mg Intravenous Q6H  . sodium chloride  3 mL Intravenous Q12H  . sodium chloride  3 mL Intravenous Q12H   Continuous Infusions:  PRN Meds:.sodium chloride, acetaminophen, albuterol, ondansetron **OR** ondansetron (ZOFRAN) IV, sodium chloride   Results for orders placed or performed during the hospital encounter of 03/10/14 (from the past 48 hour(s))  Troponin I     Status: Abnormal   Collection Time: 03/10/14 11:01 PM  Result Value Ref Range   Troponin I 0.17 (H) <0.031 ng/mL    Comment:        PERSISTENTLY INCREASED TROPONIN VALUES IN THE RANGE OF 0.04-0.49 ng/mL CAN BE SEEN IN:       -UNSTABLE ANGINA       -CONGESTIVE HEART FAILURE       -MYOCARDITIS       -CHEST TRAUMA       -ARRYHTHMIAS       -LATE PRESENTING MYOCARDIAL INFARCTION       -COPD   CLINICAL FOLLOW-UP RECOMMENDED. Please note change in reference range.   CBC     Status: Abnormal   Collection Time: 03/10/14 11:01 PM  Result Value Ref Range   WBC 4.0 4.0 - 10.5 K/uL   RBC 2.91 (L) 4.22 - 5.81 MIL/uL   Hemoglobin 10.0 (L) 13.0 - 17.0 g/dL   HCT 30.5 (L) 39.0 - 52.0 %   MCV 104.8 (H) 78.0 - 100.0 fL   MCH 34.4 (H) 26.0 - 34.0 pg   MCHC 32.8 30.0 - 36.0 g/dL   RDW 15.5 11.5 - 15.5 %   Platelets 138 (L) 150 - 400 K/uL  Basic metabolic panel     Status: Abnormal   Collection Time: 03/10/14 11:01 PM  Result Value Ref Range   Sodium 139 135 - 145 mmol/L    Comment: Please note change in reference range.   Potassium 4.5 3.5 - 5.1 mmol/L    Comment: Please note change in reference range.   Chloride 106 96 - 112 mEq/L   CO2 25 19 - 32 mmol/L   Glucose, Bld 155 (H) 70 - 99  mg/dL   BUN 31 (H) 6 - 23 mg/dL   Creatinine, Ser 1.18 0.50 - 1.35 mg/dL   Calcium 9.7 8.4 - 10.5 mg/dL   GFR calc non Af Amer 52 (L) >90 mL/min   GFR calc Af Amer 61 (  L) >90 mL/min    Comment: (NOTE) The eGFR has been calculated using the CKD EPI equation. This calculation has not been validated in all clinical situations. eGFR's persistently <90 mL/min signify possible Chronic Kidney Disease.    Anion gap 8 5 - 15  Brain natriuretic peptide     Status: Abnormal   Collection Time: 03/11/14 12:05 AM  Result Value Ref Range   B Natriuretic Peptide 560.0 (H) 0.0 - 100.0 pg/mL    Comment: Please note change in reference range.  Blood gas, arterial ([facility] & [facility] ONLY)     Status: Abnormal   Collection Time: 03/11/14  1:30 AM  Result Value Ref Range   O2 Content 2.0 L/min   Delivery systems NASAL CANNULA    pH, Arterial 7.375 7.350 - 7.450   pCO2 arterial 35.1 35.0 - 45.0 mmHg   pO2, Arterial 101.0 (H) 80.0 - 100.0 mmHg   Bicarbonate 20.1 20.0 - 24.0 mEq/L   TCO2 18.2 0 - 100 mmol/L   Acid-base deficit 4.3 (H) 0.0 - 2.0 mmol/L   O2 Saturation 97.2 %   Patient temperature 37.0    Collection site RIGHT RADIAL    Drawn by [id number]    Sample type ARTERIAL    Allens test (pass/fail) PASS PASS  Troponin I     Status: Abnormal   Collection Time: 03/11/14  2:18 AM  Result Value Ref Range   Troponin I 0.18 (H) <0.031 ng/mL    Comment:        PERSISTENTLY INCREASED TROPONIN VALUES IN THE RANGE OF 0.04-0.49 ng/mL CAN BE SEEN IN:       -UNSTABLE ANGINA       -CONGESTIVE HEART FAILURE       -MYOCARDITIS       -CHEST TRAUMA       -ARRYHTHMIAS       -LATE PRESENTING MYOCARDIAL INFARCTION       -COPD   CLINICAL FOLLOW-UP RECOMMENDED. Please note change in reference range.   CBC     Status: Abnormal   Collection Time: 03/11/14  3:06 AM  Result Value Ref Range   WBC 3.8 (L) 4.0 - 10.5 K/uL   RBC 3.02 (L) 4.22 - 5.81 MIL/uL   Hemoglobin 10.3 (L) 13.0 - 17.0 g/dL   HCT 31.7 (L) 39.0 - 52.0  %   MCV 105.0 (H) 78.0 - 100.0 fL   MCH 34.1 (H) 26.0 - 34.0 pg   MCHC 32.5 30.0 - 36.0 g/dL   RDW 15.6 (H) 11.5 - 15.5 %   Platelets 141 (L) 150 - 400 K/uL  Comprehensive metabolic panel     Status: Abnormal   Collection Time: 03/11/14  3:06 AM  Result Value Ref Range   Sodium 139 135 - 145 mmol/L    Comment: Please note change in reference range.   Potassium 4.5 3.5 - 5.1 mmol/L    Comment: Please note change in reference range.   Chloride 107 96 - 112 mEq/L   CO2 24 19 - 32 mmol/L   Glucose, Bld 178 (H) 70 - 99 mg/dL   BUN 29 (H) 6 - 23 mg/dL   Creatinine, Ser 1.16 0.50 - 1.35 mg/dL   Calcium 9.5 8.4 - 10.5 mg/dL   Total Protein 7.0 6.0 - 8.3 g/dL   Albumin 4.3 3.5 - 5.2 g/dL   AST 53 (H) 0 - 37 U/L   ALT 74 (H) 0 - 53 U/L   Alkaline Phosphatase 89 39 - 117 U/L  Total Bilirubin 0.5 0.3 - 1.2 mg/dL   GFR calc non Af Amer 54 (L) >90 mL/min   GFR calc Af Amer 62 (L) >90 mL/min    Comment: (NOTE) The eGFR has been calculated using the CKD EPI equation. This calculation has not been validated in all clinical situations. eGFR's persistently <90 mL/min signify possible Chronic Kidney Disease.    Anion gap 8 5 - 15  Troponin I     Status: Abnormal   Collection Time: 03/11/14  7:47 AM  Result Value Ref Range   Troponin I 0.18 (H) <0.031 ng/mL    Comment:        PERSISTENTLY INCREASED TROPONIN VALUES IN THE RANGE OF 0.04-0.49 ng/mL CAN BE SEEN IN:       -UNSTABLE ANGINA       -CONGESTIVE HEART FAILURE       -MYOCARDITIS       -CHEST TRAUMA       -ARRYHTHMIAS       -LATE PRESENTING MYOCARDIAL INFARCTION       -COPD   CLINICAL FOLLOW-UP RECOMMENDED. Please note change in reference range.     Dg Chest 2 View  03/09/2014   CLINICAL DATA:  Three-day history of difficulty breathing. History of emphysema  EXAM: CHEST  2 VIEW  COMPARISON:  Chest radiograph February 13, 2014; chest CT February 13, 2014  FINDINGS: There is underlying emphysema. There is scarring in the right  lower lobe which appears stable. There is no frank edema or consolidation. The heart is mildly enlarged with pulmonary vascularity within normal limits. Is atherosclerotic change in aorta. No adenopathy. No bone lesions.  IMPRESSION: Underlying emphysema. Scarring right lower lobe, stable. No frank edema or consolidation. Heart prominent but stable.   Electronically Signed   By: Lowella Grip M.D.   On: 03/09/2014 11:47   Ct Angio Chest Pe W/cm &/or Wo Cm  03/11/2014   CLINICAL DATA:  Shortness of breath.  Recent pulmonary embolism.  EXAM: CT ANGIOGRAPHY CHEST WITH CONTRAST  TECHNIQUE: Multidetector CT imaging of the chest was performed using the standard protocol during bolus administration of intravenous contrast. Multiplanar CT image reconstructions and MIPs were obtained to evaluate the vascular anatomy.  CONTRAST:  14m OMNIPAQUE IOHEXOL 350 MG/ML SOLN  COMPARISON:  02/13/2014  FINDINGS: THORACIC INLET/BODY WALL:  No acute abnormality.  MEDIASTINUM:  Normal heart size. No pericardial effusion. There is atherosclerosis, including the coronary arteries. A small persistent left SVC is incidentally noted. There is non opacification of the left atrial appendage. Although there is early opacification of the left heart, this was also seen previously. Noted history of atrial fibrillation. No acute or chronic pulmonary embolism. No evidence of acute aortic pathology, although there is no enhancement within the systemic arterial tree.  LUNG WINDOWS:  Centrilobular emphysema which is mild. Bibasilar scarring which is stable. No edema or pneumonia. Trace bilateral pleural effusion.  There are multiple small scattered pulmonary nodules that are peripheral. These are stable from 2014 at least, including the largest within the right middle lobe at 5 mm (image 50).  UPPER ABDOMEN:  No acute findings.  OSSEOUS:  No acute fracture.  No suspicious lytic or blastic lesions.  Review of the MIP images confirms the above  findings.  IMPRESSION: 1. No acute or chronic pulmonary embolism. 2. Non enhancement of the left atrial appendage which could be delayed mixing or thrombus in this patient with history of atrial fibrillation. 3. Trace bilateral pleural effusion. 4. Emphysema.  Electronically Signed   By: Jorje Guild M.D.   On: 03/11/2014 00:07    ROS  See HPI Eyes: Negative Ears:Negative for hearing loss, tinnitus Cardiovascular: Negative for chest pain, palpitations, near-syncope, orthopnea, and syncope,edema, claudication, cyanosis,.  Respiratory:   Negative for cough, hemoptysis, sputum production and wheezing.   Endocrine: Negative for cold intolerance and heat intolerance.  Hematologic/Lymphatic: Negative for adenopathy and bleeding problem. Does not bruise/bleed easily.  Musculoskeletal: Negative.   Gastrointestinal:positive for nausea, vomiting, abdominal pain, diarrhea   Genitourinary: positive for bladder incontinence yesterday,negative dysuria, flank pain, frequency, hematuria, hesitancy, nocturia and urgency.  Neurological: Negative.  Allergic/Immunologic: Negative for environmental allergies.  Blood pressure 142/70, pulse 59, temperature 97.5 F (36.4 C), temperature source Oral, resp. rate 18, height 5' 8"  (1.727 m), weight 189 lb (85.73 kg), SpO2 98 %. Physical Exam PHYSICAL EXAM: Well-nournished, in no acute distress. Neck: No JVD, HJR, Bruit, or thyroid enlargement Lungs:Decreased breath sounds right base with fine crackles bases Cardiovascular: Irregular irregular, PMI not displaced, heart sounds decreased, 1/6 systolic murmur LSB,no gallops, bruit, thrill, or heave. Abdomen: BS normal. Soft without organomegaly, masses, lesions or tenderness. Extremities: without cyanosis, clubbing or edema. Good distal pulses bilateral SKin: Warm, no lesions or rashes  Musculoskeletal: No deformities Neuro: no focal signs  2Decho 02/15/14: Study Conclusions  - Left ventricle: The cavity size  was normal. Wall thickness was   increased in a pattern of moderate LVH. Systolic function was   normal. The estimated ejection fraction was in the range of 50%   to 55%. Wall motion was normal; there were no regional wall   motion abnormalities. Doppler parameters are consistent with high   ventricular filling pressure. - Mitral valve: There was mild regurgitation. - Left atrium: The atrium was moderately dilated. - Right ventricle: The cavity size was normal. Wall thickness was   mildly increased. Systolic function was mildly reduced. - Right atrium: The atrium was mildly dilated.  Impressions:  - Normal LV function; elevated LV filling pressure; moderate LVH;   moderate LAE; mild RAE; mildly reduced RV function; mild MR and   TR.  EKG: atrial fibrillation at 68/m. No acute change.  Assessment/Plan: Acute dyspnea with drop in O2 sats 80. No evidence of acute or chronic PE on CT angio  ? LA thrombus on CT -2Decho pending.  Chronic atrial fibrillation with controlled rate. Not on rate lowering meds.  Elevated Troponins: 0.18, 0.17, 0.18, 0.18. Have been elevated since PE in 01/2014, probably related to PE  Bilateral pulmonary embolus 02/13/14: lovenox BID  Chronic systolic and diastolic dysfunction with varying EF's over the years. Most recent echo 02/15/14 EF50-55%.  HTN  History of Colon CA  History of orthostatic hypotension: beta blocker, ace inhibitor stopped as outpatient.and Lasix is taken prn.  Bryce Barrios PA-C  03/11/2014, 11:11 AM    Attending note:  Patient seen and examined. Reviewed records and discussed the case with Ms. Bonnell Public PA-C. Patient is currently admitted to the hospital after a recent episode of sudden shortness of breath and dizziness associated with near syncope on walking in the hall at home. Also had documented hypoxia, not on home oxygen. His history includes long-standing recurrent thromboembolic disease, on a variety of anticoagulants over  time including Coumadin, Xarelto, and most recently subcutaneous Lovenox given twice daily. He had a recently diagnosed pulmonary embolus in December 2015, at that time hematology consultation at [facility] recommended twice daily Lovenox injections going forward. Patient lives alone, has some limited assistance,  reportedly takes his medications and gives himself Lovenox injections. He had a CT exam of the chest done on evaluation in the ER yesterday that did not demonstrate new pulmonary embolus. There was mention of inadequate opacification of the left atrial appendage raising the possibility of incomplete mixing or thrombus. His most recent echocardiogram did not describe any obvious left atrial abnormality. This morning he states that he feels better, lying in bed without any specific complaints. Lungs exhibit decreased breath sounds but clear and nonlabored, cardiac exam with irregularly irregular rhythm and soft systolic murmur. His cardiac enzymes have been minimally abnormal, peak troponin I 0.18 and in flat pattern, not consistent with ACS. ECG shows rate-controlled atrial fibrillation with right bundle branch block and left anterior fascicular block. Follow-up echocardiogram will be obtained with attention to the left atrium, and if visualized well enough the left atrial appendage. Even if he does have thrombus in this area, this would not be an explanation for his shortness of breath or hypoxia, and would be consistent with his diagnosis of atrial fibrillation. He will need ongoing anticoagulation. Would recommend discussing with hematology whether the best approach is to continue twice daily Lovenox injections in this elderly male who lives alone, or whether any oral agents might still be an option.  Satira Sark, M.D., F.A.C.C.

## 2014-03-11 NOTE — ED Notes (Signed)
88 ML urine volume in bladder

## 2014-03-11 NOTE — H&P (Signed)
PCP:   PROVIDER NOT IN SYSTEM   Chief Complaint:  Shortness of breath  HPI:  81 year old male who  has a past medical history of Colon cancer; Hypertension; Hypercholesterolemia; Blood transfusion; DVT (deep venous thrombosis); PE (pulmonary embolism); Diabetes mellitus; Chronic atrial fibrillation; Diabetes mellitus, type II; Ventral hernia; Unspecified hypothyroidism; LVH (left ventricular hypertrophy); Bilateral renal cysts; Calculus of left kidney; COPD (chronic obstructive pulmonary disease); Chronic combined systolic and diastolic CHF (congestive heart failure); RBBB with left anterior fascicular block; Bradycardia; Pulmonary hypertension; Mild mitral regurgitation; Tricuspid regurgitation; Orthostatic hypotension; Dementia; CAD (coronary artery disease); Syncope; Ventral hernia; Pulmonary nodules; and CKD (chronic kidney disease), stage III. Patient was recently discharged from hospital on 02/17/2014 after he was diagnosed with pulmonary embolism, patient was discharged on Lovenox injection twice a day. Yesterday patient came to the ED with shortness of breath and was discharged home with prednisone and nebulizer treatment for COPD exacerbation. As per patient he has been getting worsening of shortness of breath on exertion, and today also complains of lower abdominal pain. Patient was found on the floor at the group home, though he denies passing out. Patient says that he hasn't moved his bowels for past 3 days. In the ED CT angiogram chest was done today, which surprisingly did not show any acute or chronic pulmonary embolism, but showed no enhancement of left atrial appendage which could be delayed mixing or thrombus. Patient also has mild elevation of troponin He denies chest pain, no nausea or vomiting.  No fever On exertion patient's O2 sats dropped to 80s. He does not have home oxygen. Allergies:   Allergies  Allergen Reactions  . Codeine Shortness Of Breath    Shortness of  breath  . Penicillins Shortness Of Breath  . Morphine And Related Itching      Past Medical History  Diagnosis Date  . Colon cancer     Status post partial colectomy  . Hypertension   . Hypercholesterolemia   . Blood transfusion   . DVT (deep venous thrombosis)     a. Prior h/o such. b. Recurrent subacute bilat DVT 2014.  . PE (pulmonary embolism)   . Diabetes mellitus   . Chronic atrial fibrillation   . Diabetes mellitus, type II   . Ventral hernia   . Unspecified hypothyroidism   . LVH (left ventricular hypertrophy)   . Bilateral renal cysts   . Calculus of left kidney     non-obstructing  . COPD (chronic obstructive pulmonary disease)   . Chronic combined systolic and diastolic CHF (congestive heart failure)     a. Echo 07/2013: EF 40-50%, mod LVH, mild MR, mildly dilated LA, mild-mod TR, PA pressure 52mmHg.  Marland Kitchen RBBB with left anterior fascicular block   . Bradycardia   . Pulmonary hypertension     a. Echo 07/2013: PASP 105mmHg.  . Mild mitral regurgitation     a. By echo 07/2013.  . Tricuspid regurgitation     a. Mild-mod by echo 07/2013.  . Orthostatic hypotension   . Dementia   . CAD (coronary artery disease)     a. Nonobstructive by cath in 2008. b. Nuc 08/2013: low to intermediate risk. Imaging c/w scar in the inferior wall without any large ischemic territories. LVEF 37% with fairly diffuse hypokinesis.   . Syncope   . Ventral hernia   . Pulmonary nodules   . CKD (chronic kidney disease), stage III     a. based on historical lab data.    Past  Surgical History  Procedure Laterality Date  . Abdominal surgery      x 3  . Cardiac catheterization  2008    Prior to Admission medications   Medication Sig Start Date End Date Taking? Authorizing Provider  albuterol (PROVENTIL HFA) 108 (90 BASE) MCG/ACT inhaler Inhale 2 puffs into the lungs every 6 (six) hours as needed for wheezing or shortness of breath.   Yes Historical Provider, MD  aspirin 81 MG chewable tablet  Chew 81 mg by mouth daily.   Yes Historical Provider, MD  atorvastatin (LIPITOR) 80 MG tablet Take 80 mg by mouth daily.   Yes Historical Provider, MD  enoxaparin (LOVENOX) 80 MG/0.8ML injection Inject 0.8 mLs (80 mg total) into the skin every 12 (twelve) hours. 02/17/14  Yes Delfina Redwood, MD  famotidine (PEPCID) 20 MG tablet Take 1 tablet (20 mg total) by mouth 2 (two) times daily. 12/06/12  Yes Lezlie Octave Black, NP  furosemide (LASIX) 20 MG tablet Take 1 tablet (20 mg total) by mouth daily as needed for fluid or edema. Patient taking differently: Take 20 mg by mouth daily.  09/09/13  Yes Costin Karlyne Greenspan, MD  levothyroxine (SYNTHROID, LEVOTHROID) 50 MCG tablet Take 50 mcg by mouth daily before breakfast.   Yes Historical Provider, MD  predniSONE (DELTASONE) 10 MG tablet Take 6 tablets (60 mg total) by mouth daily. 03/09/14  Yes [name], MD  vitamin B-12 (CYANOCOBALAMIN) 1000 MCG tablet Take 1,000 mcg by mouth daily.   Yes Historical Provider, MD  acetaminophen (TYLENOL) 325 MG tablet Take 650 mg by mouth every 6 (six) hours as needed for mild pain or fever.    Historical Provider, MD    Social History:  reports that he quit smoking about 41 years ago. His smoking use included Cigarettes. He started smoking about 70 years ago. He smoked 0.50 packs per day. He has quit using smokeless tobacco. His smokeless tobacco use included Chew. He reports that he does not drink alcohol or use illicit drugs.  Family History  Problem Relation Age of Onset  . Hypertension       All the positives are listed in BOLD  Review of Systems:  HEENT: Headache, blurred vision, runny nose, sore throat Neck: Hypothyroidism, hyperthyroidism,,lymphadenopathy Chest : Shortness of breath, history of COPD, Asthma Heart : Chest pain, history of coronary arterey disease GI:  Nausea, vomiting, diarrhea, constipation, GERD GU: Dysuria, urgency, frequency of urination, hematuria Neuro: Stroke, seizures,  syncope Psych: Depression, anxiety, hallucinations   Physical Exam: Blood pressure 128/79, pulse 98, temperature 97.9 F (36.6 C), temperature source Oral, resp. rate 20, height 5\' 8"  (1.727 m), weight 82.555 kg (182 lb), SpO2 98 %. Constitutional:   Patient is a well-developed and well-nourished male* in no acute distress and cooperative with exam. Head: Normocephalic and atraumatic Mouth: Mucus membranes moist Eyes: PERRL, EOMI, conjunctivae normal Neck: Supple, No Thyromegaly Cardiovascular: RRR, S1 normal, S2 normal Pulmonary/Chest: Decreased breath sounds at lung bases Abdominal: Soft. Non-tender, distended, bowel sounds are normal, no masses, organomegaly, or guarding present.  Neurological: A&O x3, Strength is normal and symmetric bilaterally, cranial nerve II-XII are grossly intact, no focal motor deficit, sensory intact to light touch bilaterally.  Extremities : No Cyanosis, Clubbing or Edema  Labs on Admission:  Basic Metabolic Panel:  Recent Labs Lab 03/09/14 1158 03/10/14 2301  NA 140 139  K 4.0 4.5  CL 107 106  CO2 26 25  GLUCOSE 117* 155*  BUN 26* 31*  CREATININE  1.27 1.18  CALCIUM 9.7 9.7   Liver Function Tests:  Recent Labs Lab 03/09/14 1158  AST 42*  ALT 59*  ALKPHOS 101  BILITOT 0.7  PROT 7.8  ALBUMIN 4.8   No results for input(s): LIPASE, AMYLASE in the last 168 hours. No results for input(s): AMMONIA in the last 168 hours. CBC:  Recent Labs Lab 03/09/14 1158 03/10/14 2301  WBC 5.5 4.0  NEUTROABS 2.4  --   HGB 11.7* 10.0*  HCT 35.9* 30.5*  MCV 104.7* 104.8*  PLT 153 138*   Cardiac Enzymes:  Recent Labs Lab 03/09/14 1158 03/10/14 2301  TROPONINI 0.18* 0.17*    BNP (last 3 results)  Recent Labs  07/26/13 1128 11/16/13 0839 01/10/14 1046  PROBNP 2036.0* 770.7* 1366.0*   CBG: No results for input(s): GLUCAP in the last 168 hours.  Radiological Exams on Admission: Dg Chest 2 View  03/09/2014   CLINICAL DATA:  Three-day  history of difficulty breathing. History of emphysema  EXAM: CHEST  2 VIEW  COMPARISON:  Chest radiograph February 13, 2014; chest CT February 13, 2014  FINDINGS: There is underlying emphysema. There is scarring in the right lower lobe which appears stable. There is no frank edema or consolidation. The heart is mildly enlarged with pulmonary vascularity within normal limits. Is atherosclerotic change in aorta. No adenopathy. No bone lesions.  IMPRESSION: Underlying emphysema. Scarring right lower lobe, stable. No frank edema or consolidation. Heart prominent but stable.   Electronically Signed   By: Lowella Grip M.D.   On: 03/09/2014 11:47   Ct Angio Chest Pe W/cm &/or Wo Cm  03/11/2014   CLINICAL DATA:  Shortness of breath.  Recent pulmonary embolism.  EXAM: CT ANGIOGRAPHY CHEST WITH CONTRAST  TECHNIQUE: Multidetector CT imaging of the chest was performed using the standard protocol during bolus administration of intravenous contrast. Multiplanar CT image reconstructions and MIPs were obtained to evaluate the vascular anatomy.  CONTRAST:  157mL OMNIPAQUE IOHEXOL 350 MG/ML SOLN  COMPARISON:  02/13/2014  FINDINGS: THORACIC INLET/BODY WALL:  No acute abnormality.  MEDIASTINUM:  Normal heart size. No pericardial effusion. There is atherosclerosis, including the coronary arteries. A small persistent left SVC is incidentally noted. There is non opacification of the left atrial appendage. Although there is early opacification of the left heart, this was also seen previously. Noted history of atrial fibrillation. No acute or chronic pulmonary embolism. No evidence of acute aortic pathology, although there is no enhancement within the systemic arterial tree.  LUNG WINDOWS:  Centrilobular emphysema which is mild. Bibasilar scarring which is stable. No edema or pneumonia. Trace bilateral pleural effusion.  There are multiple small scattered pulmonary nodules that are peripheral. These are stable from 2014 at least,  including the largest within the right middle lobe at 5 mm (image 50).  UPPER ABDOMEN:  No acute findings.  OSSEOUS:  No acute fracture.  No suspicious lytic or blastic lesions.  Review of the MIP images confirms the above findings.  IMPRESSION: 1. No acute or chronic pulmonary embolism. 2. Non enhancement of the left atrial appendage which could be delayed mixing or thrombus in this patient with history of atrial fibrillation. 3. Trace bilateral pleural effusion. 4. Emphysema.   Electronically Signed   By: Jorje Guild M.D.   On: 03/11/2014 00:07    EKG: Independently reviewed. Atrial fibrillation   Assessment/Plan Active Problems:   Dyspnea   Hypoxia   atrial fibrillation   COPD  Dyspnea on exertion Secondary to COPD. Will  start DuoNeb nebulizers every 6 hours, Solu-Medrol 60 mg IV every 6 hours.  ? Left atrial thrombus CT angiogram raise the possibility of left atrial thrombus, will get cardiology consultation in a.m. 2-D echocardiogram in a.m. patient already is on Lovenox 80 mg twice a day which was prescribed on 02/17/2014 for pulmonary embolism.  Recent history of pulmonary embolism In the previous admission on 02/13/2014, CT angiogram showed nonocclusive right lower lobe segmental pulmonary embolism. At that time patient was discharged on Lovenox. Today's CT angiogram does not show either acute or chronic pulmonary embolism. Will continue patient on Lovenox for now.   Constipation Will start Dulcolax suppository when necessary  Elevated troponin Patient has mild elevation of troponin 0.18, he denies chest pain. Likely due to demand ischemia from A. fib. At this time heart rate is controlled, will cycle the cardiac enzymes. Cardiology consultation in a.m.   Code status: Patient is DO NOT RESUSCITATE  Family discussion: Admission, patients condition and plan of care including tests being ordered have been discussed with the patient and *his daughter who indicate understanding  and agree with the plan and Code Status.   Time Spent on Admission: 60 minutes  [facility] Pager: [phone] 03/11/2014, 2:12 AM  If 7PM-7AM, please contact night-coverage  [email or web address]  Password [email or web address]

## 2014-03-11 NOTE — Progress Notes (Addendum)
SATURATION QUALIFICATIONS: (This note is used to comply with regulatory documentation for home oxygen)  Patient Saturations on Room Air at Rest = 90%  Patient Saturations on Room Air while Ambulating = 79% after ambulating approx 200 ft  Patient Saturations on 2 Liters of oxygen while Ambulating = 87% after 3 minutes  Please briefly explain why patient needs home oxygen:  Patient O2 sat dropped to 79% while ambulating on RA,  Increased to 87% with 2L [place], reached 95% on 3L [place] after 5 minutes  At rest RA sat is 90%.

## 2014-03-11 NOTE — ED Notes (Signed)
Patient ambulated around the ER without difficulty. Patient used walker and O2 sat was not readable at the time. Patient returned to room and O2 sat was 83% on room air initially. Then O2 sat returned to 95% on room air. Dr. Tomi Bamberger aware.

## 2014-03-11 NOTE — Progress Notes (Signed)
  Echocardiogram 2D Echocardiogram has been performed.  [place], [place] 03/11/2014, 4:42 PM

## 2014-03-12 DIAGNOSIS — Z86711 Personal history of pulmonary embolism: Secondary | ICD-10-CM

## 2014-03-12 DIAGNOSIS — R0609 Other forms of dyspnea: Secondary | ICD-10-CM

## 2014-03-12 DIAGNOSIS — I2699 Other pulmonary embolism without acute cor pulmonale: Secondary | ICD-10-CM | POA: Diagnosis not present

## 2014-03-12 DIAGNOSIS — I5042 Chronic combined systolic (congestive) and diastolic (congestive) heart failure: Secondary | ICD-10-CM

## 2014-03-12 DIAGNOSIS — I272 Other secondary pulmonary hypertension: Secondary | ICD-10-CM

## 2014-03-12 DIAGNOSIS — R0600 Dyspnea, unspecified: Secondary | ICD-10-CM

## 2014-03-12 LAB — VITAMIN B12: Vitamin B-12: 660 pg/mL (ref 211–911)

## 2014-03-12 LAB — HEMOGLOBIN A1C
Hgb A1c MFr Bld: 6.8 % — ABNORMAL HIGH (ref <5.7)
MEAN PLASMA GLUCOSE: 148 mg/dL — AB (ref <117)

## 2014-03-12 LAB — GLUCOSE, CAPILLARY
GLUCOSE-CAPILLARY: 127 mg/dL — AB (ref 70–99)
GLUCOSE-CAPILLARY: 193 mg/dL — AB (ref 70–99)
Glucose-Capillary: 168 mg/dL — ABNORMAL HIGH (ref 70–99)
Glucose-Capillary: 181 mg/dL — ABNORMAL HIGH (ref 70–99)

## 2014-03-12 LAB — FERRITIN: Ferritin: 246 ng/mL (ref 22–322)

## 2014-03-12 LAB — OCCULT BLOOD X 1 CARD TO LAB, STOOL: FECAL OCCULT BLD: NEGATIVE

## 2014-03-12 LAB — FOLATE: Folate: 10.4 ng/mL

## 2014-03-12 MED ORDER — RIVAROXABAN 20 MG PO TABS
20.0000 mg | ORAL_TABLET | Freq: Every day | ORAL | Status: DC
  Administered 2014-03-13 – 2014-03-20 (×8): 20 mg via ORAL
  Filled 2014-03-12 (×8): qty 1

## 2014-03-12 MED ORDER — ALBUTEROL SULFATE (2.5 MG/3ML) 0.083% IN NEBU
2.5000 mg | INHALATION_SOLUTION | Freq: Three times a day (TID) | RESPIRATORY_TRACT | Status: DC
  Administered 2014-03-12 – 2014-03-20 (×24): 2.5 mg via RESPIRATORY_TRACT
  Filled 2014-03-12 (×25): qty 3

## 2014-03-12 MED ORDER — ALBUTEROL SULFATE (2.5 MG/3ML) 0.083% IN NEBU
2.5000 mg | INHALATION_SOLUTION | RESPIRATORY_TRACT | Status: DC | PRN
Start: 2014-03-12 — End: 2014-03-21
  Administered 2014-03-20: 2.5 mg via RESPIRATORY_TRACT
  Filled 2014-03-12: qty 3

## 2014-03-12 MED ORDER — METHYLPREDNISOLONE SODIUM SUCC 125 MG IJ SOLR
60.0000 mg | Freq: Two times a day (BID) | INTRAMUSCULAR | Status: DC
  Administered 2014-03-12 – 2014-03-13 (×2): 60 mg via INTRAVENOUS
  Filled 2014-03-12 (×2): qty 2

## 2014-03-12 NOTE — Progress Notes (Signed)
[facility] PROGRESS NOTE  Bryce Mcdonald FXT:[id number] DOB: Jun 15, 1921 DOA: 03/10/2014 PCP: PROVIDER NOT IN SYSTEM  Assessment/Plan: Dyspnea on exertion Secondary to COPD exacerbation. Much improved. Does complain of worsening sob when "i bend over to make water". Oxygen saturation 98% on 2L. Will wean as able. Will ambulate and check sats .  Continue DuoNeb nebulizer. Will decrease Solu-Medrol and plan to transition to po tomorrow  COPD exacerbation: not on  home oxygen reportedly. See #1   ? Left atrial thrombus CT angiogram raise the possibility of left atrial thrombus. Echo did not show obvious large thrombus. Evaluated by cardiology who recommend no further imaging and continuation of anticoagulation. They do recommend changing to Xarelto and offer to assist him in getting. Will transition tomorrow  Recent history of pulmonary embolism In the previous admission on 02/13/2014, CT angiogram showed nonocclusive right lower lobe segmental pulmonary embolism. Will transition to [place] as noted above. Continues with SOB. See #1.  Constipation no BM for several days. Continue dulcolax suppository when necessary  Elevated troponin Stable. Likely due to demand ischemia from A. fib. Rate remains controlled. No chest pain. EKG with no acute changes.  Diabetes II:  diet controlled. CBG trending up likely related to steroids. Will decrease steroids and continue SSI for optimal control.   Anemia: likely related to chronic disease. Chart review indicates current hg below baseline. Will obtain FOBT and anemia panel.    Code Status: full Family Communication: none present Disposition Plan: home hopefully tomorrow   Consultants:  Cardiology  hemalology  Procedures:  echo on 03/11/14 with pattern of severe LVH. Systolic function was normal. The estimated ejection fraction was 55%. Wall motion was normal; there were no regional wall motion  abnormalities.  Antibiotics:  none  HPI/Subjective: Sitting on side of bed. Denies pain/discomfort. Complains sob when "bending over to make water"  Objective: Filed Vitals:   03/12/14 0500  BP: 123/79  Pulse: 80  Temp: 98.2 F (36.8 C)  Resp: 18    Intake/Output Summary (Last 24 hours) at 03/12/14 1124 Last data filed at 03/12/14 1000  Gross per 24 hour  Intake    240 ml  Output    375 ml  Net   -135 ml   Filed Weights   03/10/14 2232 03/11/14 0249  Weight: 82.555 kg (182 lb) 85.73 kg (189 lb)    Exam:   General:  Appears comfortable  Cardiovascular: irregularly irregular no m/g/r no LE edema  Respiratory: normal effort BS distant but mostly clear no wheeze  Abdomen: soft non-distended +BS   Musculoskeletal: joints without swelling/erythema   Data Reviewed: Basic Metabolic Panel:  Recent Labs Lab 03/09/14 1158 03/10/14 2301 03/11/14 0306  NA 140 139 139  K 4.0 4.5 4.5  CL 107 106 107  CO2 26 25 24   GLUCOSE 117* 155* 178*  BUN 26* 31* 29*  CREATININE 1.27 1.18 1.16  CALCIUM 9.7 9.7 9.5   Liver Function Tests:  Recent Labs Lab 03/09/14 1158 03/11/14 0306  AST 42* 53*  ALT 59* 74*  ALKPHOS 101 89  BILITOT 0.7 0.5  PROT 7.8 7.0  ALBUMIN 4.8 4.3   No results for input(s): LIPASE, AMYLASE in the last 168 hours. No results for input(s): AMMONIA in the last 168 hours. CBC:  Recent Labs Lab 03/09/14 1158 03/10/14 2301 03/11/14 0306  WBC 5.5 4.0 3.8*  NEUTROABS 2.4  --   --   HGB 11.7* 10.0* 10.3*  HCT 35.9* 30.5* 31.7*  MCV 104.7* 104.8*  105.0*  PLT 153 138* 141*   Cardiac Enzymes:  Recent Labs Lab 03/09/14 1158 03/10/14 2301 03/11/14 0218 03/11/14 0747 03/11/14 1418  TROPONINI 0.18* 0.17* 0.18* 0.18* 0.17*   BNP (last 3 results)  Recent Labs  07/26/13 1128 11/16/13 0839 01/10/14 1046  PROBNP 2036.0* 770.7* 1366.0*   CBG:  Recent Labs Lab 03/11/14 1135 03/11/14 1648 03/11/14 2200 03/12/14 0717 03/12/14 1114   GLUCAP 233* 193* 187* 127* 193*    No results found for this or any previous visit (from the past 240 hour(s)).   Studies: Ct Angio Chest Pe W/cm &/or Wo Cm  03/11/2014   CLINICAL DATA:  Shortness of breath.  Recent pulmonary embolism.  EXAM: CT ANGIOGRAPHY CHEST WITH CONTRAST  TECHNIQUE: Multidetector CT imaging of the chest was performed using the standard protocol during bolus administration of intravenous contrast. Multiplanar CT image reconstructions and MIPs were obtained to evaluate the vascular anatomy.  CONTRAST:  111mL OMNIPAQUE IOHEXOL 350 MG/ML SOLN  COMPARISON:  02/13/2014  FINDINGS: THORACIC INLET/BODY WALL:  No acute abnormality.  MEDIASTINUM:  Normal heart size. No pericardial effusion. There is atherosclerosis, including the coronary arteries. A small persistent left SVC is incidentally noted. There is non opacification of the left atrial appendage. Although there is early opacification of the left heart, this was also seen previously. Noted history of atrial fibrillation. No acute or chronic pulmonary embolism. No evidence of acute aortic pathology, although there is no enhancement within the systemic arterial tree.  LUNG WINDOWS:  Centrilobular emphysema which is mild. Bibasilar scarring which is stable. No edema or pneumonia. Trace bilateral pleural effusion.  There are multiple small scattered pulmonary nodules that are peripheral. These are stable from 2014 at least, including the largest within the right middle lobe at 5 mm (image 50).  UPPER ABDOMEN:  No acute findings.  OSSEOUS:  No acute fracture.  No suspicious lytic or blastic lesions.  Review of the MIP images confirms the above findings.  IMPRESSION: 1. No acute or chronic pulmonary embolism. 2. Non enhancement of the left atrial appendage which could be delayed mixing or thrombus in this patient with history of atrial fibrillation. 3. Trace bilateral pleural effusion. 4. Emphysema.   Electronically Signed   By: Jorje Guild  M.D.   On: 03/11/2014 00:07    Scheduled Meds: . albuterol  2.5 mg Nebulization Q4H  . aspirin  81 mg Oral Daily  . atorvastatin  80 mg Oral q1800  . enoxaparin  1 mg/kg Subcutaneous Q12H  . famotidine  20 mg Oral BID  . insulin aspart  0-5 Units Subcutaneous QHS  . insulin aspart  0-9 Units Subcutaneous TID WC  . levothyroxine  50 mcg Oral QAC breakfast  . methylPREDNISolone (SOLU-MEDROL) injection  60 mg Intravenous Q6H  . [START ON 03/13/2014] rivaroxaban  20 mg Oral Q breakfast  . sodium chloride  3 mL Intravenous Q12H  . sodium chloride  3 mL Intravenous Q12H   Continuous Infusions:   Active Problems:   HTN (hypertension)   Type 2 diabetes mellitus   Anemia   Dyspnea   Chronic combined systolic and diastolic CHF (congestive heart failure)   COPD (chronic obstructive pulmonary disease)   Hypertension   CKD (chronic kidney disease), stage III   Hypoxia    Time spent: [facility] Pager [phone]. If 7PM-7AM, please contact night-coverage at [email or web address], password [email or web address] 03/12/2014, 11:24 AM  LOS: 2 days

## 2014-03-12 NOTE — Progress Notes (Signed)
Primary cardiologist: Dr. Kate Sable  Seen for followup: Question left atrial appendage thrombus  Subjective:    No specific complaints this morning. Some shortness of breath from getting up to the bathroom last night. Rested well.  Objective:   Temp:  [97.7 F (36.5 C)-98.3 F (36.8 C)] 98.2 F (36.8 C) (01/21 0500) Pulse Rate:  [80-103] 80 (01/21 0500) Resp:  [18-20] 18 (01/21 0500) BP: (123-143)/(61-79) 123/79 mmHg (01/21 0500) SpO2:  [79 %-100 %] 98 % (01/21 0741) Last BM Date: 03/12/14  [facility] Weights   03/10/14 2232 03/11/14 0249  Weight: 182 lb (82.555 kg) 189 lb (85.73 kg)    Intake/Output Summary (Last 24 hours) at 03/12/14 1016 Last data filed at 03/12/14 0900  Gross per 24 hour  Intake    240 ml  Output    200 ml  Net     40 ml    Telemetry: Rate-controlled atrial fibrillation.  Exam:  General: Appears comfortable at rest.  Lungs: Coarse breath sounds, nonlabored.  Cardiac: Irregularly irregular.  Extremities: No pitting edema.   Lab Results:  Basic Metabolic Panel:  Recent Labs Lab 03/09/14 1158 03/10/14 2301 03/11/14 0306  NA 140 139 139  K 4.0 4.5 4.5  CL 107 106 107  CO2 26 25 24   GLUCOSE 117* 155* 178*  BUN 26* 31* 29*  CREATININE 1.27 1.18 1.16  CALCIUM 9.7 9.7 9.5    Liver Function Tests:  Recent Labs Lab 03/09/14 1158 03/11/14 0306  AST 42* 53*  ALT 59* 74*  ALKPHOS 101 89  BILITOT 0.7 0.5  PROT 7.8 7.0  ALBUMIN 4.8 4.3    CBC:  Recent Labs Lab 03/09/14 1158 03/10/14 2301 03/11/14 0306  WBC 5.5 4.0 3.8*  HGB 11.7* 10.0* 10.3*  HCT 35.9* 30.5* 31.7*  MCV 104.7* 104.8* 105.0*  PLT 153 138* 141*    Cardiac Enzymes:  Recent Labs Lab 03/11/14 0218 03/11/14 0747 03/11/14 1418  TROPONINI 0.18* 0.18* 0.17*    BNP:  Recent Labs  07/26/13 1128 11/16/13 0839 01/10/14 1046  PROBNP 2036.0* 770.7* 1366.0*    Coagulation:  Recent Labs Lab 03/09/14 1158  INR 1.10    Echocardiogram  03/11/14: Study Conclusions  - Left ventricle: The cavity size was normal. Wall thickness was increased in a pattern of severe LVH. Systolic function was normal. The estimated ejection fraction was 55%. Wall motion was normal; there were no regional wall motion abnormalities. - Aortic valve: Moderately calcified annulus. Trileaflet; mildly calcified leaflets. - Mitral valve: Calcified annulus. There was mild regurgitation. - Left atrium: The atrium was moderately dilated. Visualized portion of the left atrial appendage does not show any obvious thrombus within the mid cavity to mouth. Distal tip is not visualized. - Right atrium: The atrium was moderately dilated. Central venous pressure (est): 15 mm Hg. - Atrial septum: No defect or patent foramen ovale was identified. - Tricuspid valve: There was mild-moderate regurgitation. - Pulmonary arteries: Systolic pressure was moderately to severely increased. PA peak pressure: 65 mm Hg (S). - Pericardium, extracardiac: There was no pericardial effusion.  Impressions:  - Severe LVH with LVEF approximately 55%. There is evidence of diastolic dysfunction with increased filling pressures, ungraded with atrial fibrillation. Moderate biatrial enlargement. Visualized portion of the left atrial appendage does not show any obvious thrombus within the mid cavity to mouth. Distal tip is not visualized. mild mitral regurgitation. Sclerotic aortic valve without stenosis. Mild to moderate tricuspid regurgitation with PASP 65 mmHg consistent with moderate to  severe pulmonary hypertension.   Medications:   Scheduled Medications: . albuterol  2.5 mg Nebulization Q4H  . aspirin  81 mg Oral Daily  . atorvastatin  80 mg Oral q1800  . enoxaparin  1 mg/kg Subcutaneous Q12H  . famotidine  20 mg Oral BID  . insulin aspart  0-5 Units Subcutaneous QHS  . insulin aspart  0-9 Units Subcutaneous TID WC  . levothyroxine  50  mcg Oral QAC breakfast  . methylPREDNISolone (SOLU-MEDROL) injection  60 mg Intravenous Q6H  . sodium chloride  3 mL Intravenous Q12H  . sodium chloride  3 mL Intravenous Q12H     PRN Medications: sodium chloride, acetaminophen, ondansetron **OR** ondansetron (ZOFRAN) IV, sodium chloride   Assessment:   1. Question of left atrial appendage thrombus versus incomplete contrast mixing based on chest CT results. Echocardiogram done yesterday does not show any obvious large thrombus within the mouth or midportion of the left atrial appendage, distal tip not visualized. Doubt that this needs further imaging (do not plan TEE), since the patient is to be anticoagulated anyway.  2. Chronic atrial fibrillation, rate controlled.  3. 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 Patient evaluated by hematology yesterday, appreciate assessment and recommendations.  4. Minor troponin I elevations, flat pattern, likely demand ischemia.  Plan/Discussion:    No further cardiac workup planned at this time. In review of hematology consultation, plan is for patient to transition from outpatient twice daily Lovenox back to Xarelto 20 mg daily which he had tolerated in the past. This was apparently stopped by the [facility] medical system (not covered), we can likely assist him in getting this through our practice. Clearly, this would be much easier for him to administer.   Satira Sark, M.D., F.A.C.C.

## 2014-03-12 NOTE — Progress Notes (Signed)
Patient ambulated halls this afternoon.  O2 sat dropped to 73% on 2L while ambulated and patient exhibited increased SOB with the exertion. After a few minutes of returning to rest on side of bed, O2 sats on 2L rose to mid 90's%.

## 2014-03-12 NOTE — Clinical Documentation Improvement (Signed)
Supporting Information: Patient with COPD exacerbation, is on home Oxygen per 01/20 progress notes. Patient's O2 saturation dropped to 79% on room air while ambulating; O2 saturation up to 87% with O2 @ 2L per n/c and increased to 95% on 3L of oxygen after 5 minutes per 01/21 progress notes.    Possible Clinical Conditions: . Document acuity: --Acute Respiratory Failure --Chronic Respiratory Failure --Acute on Chronic Respiratory Failure . Document inclusion of: --Hypoxia --Hypercapnia . Document tobacco: --Use --Abuse --History of . Document any associated diagnoses/conditions    Thank Sherian Maroon Documentation Specialist [phone] Helmi [email or web address]

## 2014-03-13 ENCOUNTER — Inpatient Hospital Stay (HOSPITAL_COMMUNITY): Payer: Non-veteran care

## 2014-03-13 DIAGNOSIS — R0902 Hypoxemia: Secondary | ICD-10-CM

## 2014-03-13 DIAGNOSIS — I2699 Other pulmonary embolism without acute cor pulmonale: Secondary | ICD-10-CM

## 2014-03-13 DIAGNOSIS — R0609 Other forms of dyspnea: Secondary | ICD-10-CM

## 2014-03-13 DIAGNOSIS — I5033 Acute on chronic diastolic (congestive) heart failure: Secondary | ICD-10-CM

## 2014-03-13 LAB — CBC
HCT: 36.8 % — ABNORMAL LOW (ref 39.0–52.0)
HCT: 36.1 % — ABNORMAL LOW (ref 39.0–52.0)
HEMOGLOBIN: 11.6 g/dL — AB (ref 13.0–17.0)
Hemoglobin: 11.6 g/dL — ABNORMAL LOW (ref 13.0–17.0)
MCH: 30 pg (ref 26.0–34.0)
MCH: 34 pg (ref 26.0–34.0)
MCHC: 31.5 g/dL (ref 30.0–36.0)
MCHC: 32.1 g/dL (ref 30.0–36.0)
MCV: 95.1 fL (ref 78.0–100.0)
MCV: 105.9 fL — ABNORMAL HIGH (ref 78.0–100.0)
PLATELETS: 174 10*3/uL (ref 150–400)
Platelets: 302 10*3/uL (ref 150–400)
RBC: 3.87 MIL/uL — AB (ref 4.22–5.81)
RBC: 3.41 MIL/uL — AB (ref 4.22–5.81)
RDW: 14.9 % (ref 11.5–15.5)
RDW: 15.8 % — ABNORMAL HIGH (ref 11.5–15.5)
WBC: 26.2 10*3/uL — ABNORMAL HIGH (ref 4.0–10.5)
WBC: 9.7 10*3/uL (ref 4.0–10.5)

## 2014-03-13 LAB — GLUCOSE, CAPILLARY
GLUCOSE-CAPILLARY: 146 mg/dL — AB (ref 70–99)
Glucose-Capillary: 151 mg/dL — ABNORMAL HIGH (ref 70–99)
Glucose-Capillary: 189 mg/dL — ABNORMAL HIGH (ref 70–99)
Glucose-Capillary: 144 mg/dL — ABNORMAL HIGH (ref 70–99)

## 2014-03-13 LAB — BASIC METABOLIC PANEL
Anion gap: 6 (ref 5–15)
BUN: 31 mg/dL — ABNORMAL HIGH (ref 6–23)
CALCIUM: 9.6 mg/dL (ref 8.4–10.5)
CHLORIDE: 109 meq/L (ref 96–112)
CO2: 25 mmol/L (ref 19–32)
Creatinine, Ser: 1.19 mg/dL (ref 0.50–1.35)
GFR calc non Af Amer: 52 mL/min — ABNORMAL LOW (ref >90)
GFR, EST AFRICAN AMERICAN: 60 mL/min — AB (ref >90)
Glucose, Bld: 151 mg/dL — ABNORMAL HIGH (ref 70–99)
POTASSIUM: 4.8 mmol/L (ref 3.5–5.1)
SODIUM: 140 mmol/L (ref 135–145)

## 2014-03-13 LAB — BRAIN NATRIURETIC PEPTIDE: B Natriuretic Peptide: 491 pg/mL — ABNORMAL HIGH (ref 0.0–100.0)

## 2014-03-13 MED ORDER — PREDNISONE 20 MG PO TABS
40.0000 mg | ORAL_TABLET | Freq: Every day | ORAL | Status: DC
  Administered 2014-03-14 – 2014-03-15 (×2): 40 mg via ORAL
  Filled 2014-03-13 (×2): qty 2

## 2014-03-13 MED ORDER — FUROSEMIDE 10 MG/ML IJ SOLN
40.0000 mg | INTRAMUSCULAR | Status: AC
  Administered 2014-03-13: 40 mg via INTRAVENOUS
  Filled 2014-03-13: qty 4

## 2014-03-13 NOTE — Progress Notes (Signed)
Late Entry:  The patient was ambulated in the hall approx 100 feet.  His O2 sats were 96% on 3 LPM n/c.  He was very short of breathe so he was taken back to his room and his O2 was reapplied.  1650 Titrated the patients O2 down to 2.5 LPM via n/c.

## 2014-03-13 NOTE — Progress Notes (Addendum)
Physical Therapy Treatment Patient Details Name: Phillips Goulette MRN: [id number] DOB: May 08, 1921 Today's Date: 03/12/2014    History of Present Illness      PT Comments    Pt was found to be mildly dyspneic while supine in bed this afternoon.  He had no c/o.  O2 sat=96% on 2 L O2.  He needs no physical assist to transfer out of bed and was instructed in closed chain exercise for LE strengthening at edge of bed.  This was done slowly with rest in between each exercise to control dyspnea.  After rest, he was instructed in gait with a walker, stable gait pattern.  He was instructed in energy conservation with gait.  At the end of gait, O2 sat=73% on 2 L O2.  After rest and deep breathing, O2 sats increased to 94%.  He was comfortable at the end of treatement  Follow Up Recommendations   HHPT     Equipment Recommendations    none   Recommendations for Other Services  none     Precautions / Restrictions      Mobility  Bed Mobility    mod independent              Transfers    mod independent                Ambulation/Gait    ambulated 75' using a walker and min guard assist             Stairs            Wheelchair Mobility    Modified Rankin (Stroke Patients Only)       Balance  with 2 hand assist, standing balance is fair                                  Cognition    alert and oriented, cooperative                        Exercises  standing toe raises x 10 repetitions  standing semi squats x 10 repetitions  standing marching x 10 repetitions    General Comments        Pertinent Vitals/Pain      Home Living                      Prior Function     lives alone with family support as needed       PT Goals (current goals can now be found in the care plan section)      Frequency   min of 3x/week    PT Plan      Co-evaluation             End of Session           Time:  -  1350-1416    Total of 26 minutes    Charges:    Therapeutic exercise 8-22 minutes                          Gait Training 8-22 minutes                   G Codes:      Sable Feil 1/212016, 2:22 PM

## 2014-03-13 NOTE — Progress Notes (Signed)
[facility] PROGRESS NOTE  Nomar Broad DGL:[id number] DOB: 1921/07/14 DOA: 03/10/2014 PCP: PROVIDER NOT IN SYSTEM  Assessment/Plan: Dyspnea on exertion Worsening. Requiring more oxygen this am to maintain sats >90%. Recent CT yields no obvious recurrent PE. Repeat chest xray yields pulmonary edema and small bilateral pleural effusions. Will provide IV lasix and insert foley. Monitor intake and output. Of note, patient takes lasix 20mg  prn at home. Has not been getting this during hospitalization.  Will provide Lasix 40mg  IV once for now.    COPD exacerbation: not on home oxygen reportedly. Will change steroids to po and taper quickly. ABG on 03/11/14 with pCO2 35 and pO2 101.  See #1   Hypoxia See #1. Echo on 03/11/14 with severe LVH and EF 55%. Appreciate cardiology assistance  ? Left atrial thrombus CT angiogram raise the possibility of left atrial thrombus. Echo did not show obvious large thrombus. Evaluated by cardiology who recommend no further imaging and continuation of anticoagulation. They do recommend changing to Xarelto and offer to assist him in getting. Will transition tomorrow  Recent history of pulmonary embolism In the previous admission on 02/13/2014, CT angiogram showed nonocclusive right lower lobe segmental pulmonary embolism. Will transition to [place] as noted above. Discontinue aspirin.  Continues with SOB. See #1.  Constipation no BM for several days. Continue dulcolax suppository when necessary  Elevated troponin Stable. Likely due to demand ischemia from A. fib. Rate remains controlled. No chest pain. EKG with no acute changes.  Diabetes II: diet controlled. CBG trending up likely related to steroids. Will decrease steroids and continue SSI for optimal control.   Anemia: likely related to chronic disease. Chart review indicates current hg below baseline. Will obtain FOBT and anemia panel.    Code Status: DNR Family Communication: none  present Disposition Plan: home when ready hopefully day or two   Consultants:  cardiology  Procedures:  Echo 03/11/14 Severe LVH with LVEF approximately 55%. There is evidence of diastolic dysfunction with increased filling pressures, ungraded with atrial fibrillation. Moderate biatrial enlargement. Visualized portion of the left atrial appendage does not show any obvious thrombus within the mid cavity to mouth. Distal tip is not visualized. mild mitral regurgitation. Sclerotic aortic valve without stenosis. Mild to moderate tricuspid regurgitation with PASP 65 mmHg consistent with moderate to severe pulmonary hypertension.  Antibiotics:  none  HPI/Subjective: Reports worsening sob during night particularly when getting up to void. Denies pain/discomfort.   Objective: Filed Vitals:   03/13/14 0359  BP: 164/75  Pulse: 90  Temp: 98 F (36.7 C)  Resp: 20    Intake/Output Summary (Last 24 hours) at 03/13/14 1032 Last data filed at 03/12/14 1900  Gross per 24 hour  Intake    720 ml  Output    200 ml  Net    520 ml   Filed Weights   03/10/14 2232 03/11/14 0249  Weight: 82.555 kg (182 lb) 85.73 kg (189 lb)    Exam:   General:  Well nourished appears comfortable  Cardiovascular: irregularly irregular no m/g/r  No LE edema  Respiratory: mild increased work of breathing with conversation. Some use of abdominal accessory muscles. BS with fine crackles in bases. No wheeze  Abdomen: non-distended soft +BS no guarding  Musculoskeletal: no clubbing or cyanosis   Data Reviewed: Basic Metabolic Panel:  Recent Labs Lab 03/09/14 1158 03/10/14 2301 03/11/14 0306 03/13/14 0530  NA 140 139 139 140  K 4.0 4.5 4.5 4.8  CL 107 106 107 109  CO2  26 25 24 25   GLUCOSE 117* 155* 178* 151*  BUN 26* 31* 29* 31*  CREATININE 1.27 1.18 1.16 1.19  CALCIUM 9.7 9.7 9.5 9.6   Liver Function Tests:  Recent Labs Lab 03/09/14 1158 03/11/14 0306  AST 42* 53*   ALT 59* 74*  ALKPHOS 101 89  BILITOT 0.7 0.5  PROT 7.8 7.0  ALBUMIN 4.8 4.3   No results for input(s): LIPASE, AMYLASE in the last 168 hours. No results for input(s): AMMONIA in the last 168 hours. CBC:  Recent Labs Lab 03/09/14 1158 03/10/14 2301 03/11/14 0306 03/13/14 0530  WBC 5.5 4.0 3.8* 26.2*  NEUTROABS 2.4  --   --   --   HGB 11.7* 10.0* 10.3* 11.6*  HCT 35.9* 30.5* 31.7* 36.8*  MCV 104.7* 104.8* 105.0* 95.1  PLT 153 138* 141* 302   Cardiac Enzymes:  Recent Labs Lab 03/09/14 1158 03/10/14 2301 03/11/14 0218 03/11/14 0747 03/11/14 1418  TROPONINI 0.18* 0.17* 0.18* 0.18* 0.17*   BNP (last 3 results)  Recent Labs  07/26/13 1128 11/16/13 0839 01/10/14 1046  PROBNP 2036.0* 770.7* 1366.0*   CBG:  Recent Labs Lab 03/12/14 0717 03/12/14 1114 03/12/14 1704 03/12/14 2034 03/13/14 0743  GLUCAP 127* 193* 168* 181* 151*    No results found for this or any previous visit (from the past 240 hour(s)).   Studies: Dg Chest Port 1 View  03/13/2014   CLINICAL DATA:  Hypoxia.  EXAM: PORTABLE CHEST - 1 VIEW  COMPARISON:  None.  FINDINGS: Cardiomegaly and pulmonary vascular prominence and diffuse interstitial prominence consistent with congestive heart failure. Bilateral pleural effusions. Subsegmental atelectasis right upper lobe. No acute bony abnormality .  IMPRESSION: 1. Congestive heart failure with pulmonary edema and bilateral pleural effusions.  2.  Subsegment atelectasis right upper lobe.   Electronically Signed   By: Marcello Moores  Register   On: 03/13/2014 09:08    Scheduled Meds: . albuterol  2.5 mg Nebulization TID  . aspirin  81 mg Oral Daily  . atorvastatin  80 mg Oral q1800  . famotidine  20 mg Oral BID  . furosemide  40 mg Intravenous NOW  . insulin aspart  0-5 Units Subcutaneous QHS  . insulin aspart  0-9 Units Subcutaneous TID WC  . levothyroxine  50 mcg Oral QAC breakfast  . methylPREDNISolone (SOLU-MEDROL) injection  60 mg Intravenous Q12H  .  rivaroxaban  20 mg Oral Q breakfast  . sodium chloride  3 mL Intravenous Q12H  . sodium chloride  3 mL Intravenous Q12H   Continuous Infusions:   Active Problems:   HTN (hypertension)   Type 2 diabetes mellitus   Anemia   Dyspnea   Chronic combined systolic and diastolic CHF (congestive heart failure)   COPD (chronic obstructive pulmonary disease)   Hypertension   CKD (chronic kidney disease), stage III   Hypoxia   DOE (dyspnea on exertion)   Acute pulmonary embolism    Time spent: 35 minutes    [facility] Pager [phone]. If 7PM-7AM, please contact night-coverage at [email or web address], password [facility] 03/13/2014, 10:32 AM  LOS: 3 days

## 2014-03-13 NOTE — Progress Notes (Signed)
Primary cardiologist: Dr. Kate Sable  Seen for followup: Question left atrial appendage thrombus  Subjective:    Reports shortness of breath overnight, better this morning. He has been requiring oxygen via nasal cannula to avoid hypoxia on room air. No chest pain or palpitations.  Objective:   Temp:  [97.8 F (36.6 C)-98.6 F (37 C)] 98 F (36.7 C) (01/22 0359) Pulse Rate:  [60-90] 90 (01/22 0359) Resp:  [20] 20 (01/22 0359) BP: (132-164)/(55-94) 164/75 mmHg (01/22 0359) SpO2:  [73 %-100 %] 98 % (01/22 0728) Last BM Date: 03/12/14  [facility] Weights   03/10/14 2232 03/11/14 0249  Weight: 182 lb (82.555 kg) 189 lb (85.73 kg)    Intake/Output Summary (Last 24 hours) at 03/13/14 1013 Last data filed at 03/12/14 1900  Gross per 24 hour  Intake    720 ml  Output    200 ml  Net    520 ml    Telemetry: Rate-controlled atrial fibrillation.  Exam:  General: Appears comfortable at rest. Wearing oxygen via nasal cannula.  Lungs: Coarse breath sounds, nonlabored.  Cardiac: Irregularly irregular.  Extremities: No pitting edema.   Lab Results:  Basic Metabolic Panel:  Recent Labs Lab 03/10/14 2301 03/11/14 0306 03/13/14 0530  NA 139 139 140  K 4.5 4.5 4.8  CL 106 107 109  CO2 25 24 25   GLUCOSE 155* 178* 151*  BUN 31* 29* 31*  CREATININE 1.18 1.16 1.19  CALCIUM 9.7 9.5 9.6    Liver Function Tests:  Recent Labs Lab 03/09/14 1158 03/11/14 0306  AST 42* 53*  ALT 59* 74*  ALKPHOS 101 89  BILITOT 0.7 0.5  PROT 7.8 7.0  ALBUMIN 4.8 4.3    CBC:  Recent Labs Lab 03/10/14 2301 03/11/14 0306 03/13/14 0530  WBC 4.0 3.8* 26.2*  HGB 10.0* 10.3* 11.6*  HCT 30.5* 31.7* 36.8*  MCV 104.8* 105.0* 95.1  PLT 138* 141* 302    Cardiac Enzymes:  Recent Labs Lab 03/11/14 0218 03/11/14 0747 03/11/14 1418  TROPONINI 0.18* 0.18* 0.17*    BNP:  Recent Labs  07/26/13 1128 11/16/13 0839 01/10/14 1046  PROBNP 2036.0* 770.7* 1366.0*     Coagulation:  Recent Labs Lab 03/09/14 1158  INR 1.10    Echocardiogram 03/11/14: Study Conclusions  - Left ventricle: The cavity size was normal. Wall thickness was increased in a pattern of severe LVH. Systolic function was normal. The estimated ejection fraction was 55%. Wall motion was normal; there were no regional wall motion abnormalities. - Aortic valve: Moderately calcified annulus. Trileaflet; mildly calcified leaflets. - Mitral valve: Calcified annulus. There was mild regurgitation. - Left atrium: The atrium was moderately dilated. Visualized portion of the left atrial appendage does not show any obvious thrombus within the mid cavity to mouth. Distal tip is not visualized. - Right atrium: The atrium was moderately dilated. Central venous pressure (est): 15 mm Hg. - Atrial septum: No defect or patent foramen ovale was identified. - Tricuspid valve: There was mild-moderate regurgitation. - Pulmonary arteries: Systolic pressure was moderately to severely increased. PA peak pressure: 65 mm Hg (S). - Pericardium, extracardiac: There was no pericardial effusion.  Impressions:  - Severe LVH with LVEF approximately 55%. There is evidence of diastolic dysfunction with increased filling pressures, ungraded with atrial fibrillation. Moderate biatrial enlargement. Visualized portion of the left atrial appendage does not show any obvious thrombus within the mid cavity to mouth. Distal tip is not visualized. mild mitral regurgitation. Sclerotic aortic valve without stenosis. Mild  to moderate tricuspid regurgitation with PASP 65 mmHg consistent with moderate to severe pulmonary hypertension.   Medications:   Scheduled Medications: . albuterol  2.5 mg Nebulization TID  . aspirin  81 mg Oral Daily  . atorvastatin  80 mg Oral q1800  . famotidine  20 mg Oral BID  . insulin aspart  0-5 Units Subcutaneous QHS  . insulin aspart  0-9 Units  Subcutaneous TID WC  . levothyroxine  50 mcg Oral QAC breakfast  . methylPREDNISolone (SOLU-MEDROL) injection  60 mg Intravenous Q12H  . rivaroxaban  20 mg Oral Q breakfast  . sodium chloride  3 mL Intravenous Q12H  . sodium chloride  3 mL Intravenous Q12H     PRN Medications: sodium chloride, acetaminophen, albuterol, ondansetron **OR** ondansetron (ZOFRAN) IV, sodium chloride   Assessment:   1. Question of left atrial appendage thrombus versus incomplete contrast mixing based on chest CT results. Echocardiogram does not show any obvious large thrombus within the mouth or midportion of the left atrial appendage, distal tip not visualized. Doubt that this needs further imaging (do not plan TEE), since the patient is to be anticoagulated anyway.  2. Chronic atrial fibrillation, rate controlled.  3. 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 Patient evaluated by hematology, appreciate assessment and recommendations.  4. Minor troponin I elevations, flat pattern, likely demand ischemia.  5. Recurring shortness of breath and hypoxia. No obvious recurrent pulmonary embolus by recent chest CT, follow-up chest x-ray does demonstrate volume overload with pulmonary edema and small bilateral pleural effusions.  Plan/Discussion:    Anticoagulation is now in the form of Xarelto 20 mg daily. We will stop aspirin. Recommend initiating IV Lasix for diuresis, place Foley catheter, follow intake and output as well as renal function.  Satira Sark, M.D., F.A.C.C.

## 2014-03-13 NOTE — Progress Notes (Signed)
Notified Dr. Wyline Copas via text that I was unable to place the indwelling catheter.  I was unable to place it because the patient could not tolerate the balloon being inflated so, I removed the catheter and placed a condom catheter.  The patient seems to be doing well with this catheter.

## 2014-03-14 DIAGNOSIS — J438 Other emphysema: Secondary | ICD-10-CM

## 2014-03-14 LAB — BASIC METABOLIC PANEL
ANION GAP: 5 (ref 5–15)
BUN: 30 mg/dL — ABNORMAL HIGH (ref 6–23)
CALCIUM: 9.6 mg/dL (ref 8.4–10.5)
CHLORIDE: 106 mmol/L (ref 96–112)
CO2: 28 mmol/L (ref 19–32)
Creatinine, Ser: 1.14 mg/dL (ref 0.50–1.35)
GFR calc Af Amer: 63 mL/min — ABNORMAL LOW (ref >90)
GFR, EST NON AFRICAN AMERICAN: 55 mL/min — AB (ref >90)
Glucose, Bld: 109 mg/dL — ABNORMAL HIGH (ref 70–99)
Potassium: 4.6 mmol/L (ref 3.5–5.1)
Sodium: 139 mmol/L (ref 135–145)

## 2014-03-14 LAB — GLUCOSE, CAPILLARY
GLUCOSE-CAPILLARY: 211 mg/dL — AB (ref 70–99)
Glucose-Capillary: 96 mg/dL (ref 70–99)
Glucose-Capillary: 116 mg/dL — ABNORMAL HIGH (ref 70–99)
Glucose-Capillary: 188 mg/dL — ABNORMAL HIGH (ref 70–99)

## 2014-03-14 MED ORDER — FUROSEMIDE 10 MG/ML IJ SOLN
60.0000 mg | Freq: Every day | INTRAMUSCULAR | Status: DC
  Administered 2014-03-14 – 2014-03-17 (×4): 60 mg via INTRAVENOUS
  Filled 2014-03-14 (×4): qty 6

## 2014-03-14 NOTE — Progress Notes (Signed)
[facility] PROGRESS NOTE  Bryce Mcdonald IOX:[id number] DOB: [birth year] DOA: 03/10/2014 PCP: PROVIDER NOT IN SYSTEM  Assessment/Plan: Dyspnea on exertion secondary to likely acutely decompensated diastolic CHF Recent CT yields no obvious recurrent PE. Repeat chest xray yields pulmonary edema and small bilateral pleural effusions.Started IV lasix. Monitor intake and output. Of note, patient takes lasix 20mg  prn at home. Has not been getting this during hospitalization. O2 requirements initially improved with diuresis but pt continues with 4LNC. Cont on daily 60mg  IV Lasix. If no improvement, consider repeat imaging. Wt on admission: 82.5kg Wt on 1/20: 85.7kg Wt on 1/23: 82.64kg  COPD exacerbation: not on home oxygen reportedly. Will change steroids to po and taper quickly. ABG on 03/11/14 with pCO2 35 and pO2 101. See #1   Hypoxia See #1. Echo on 03/11/14 with severe LVH and EF 55%. Appreciate cardiology assistance  ? Left atrial thrombus CT angiogram raise the possibility of left atrial thrombus. Echo did not show obvious large thrombus. Evaluated by cardiology who recommend no further imaging and continuation of anticoagulation. They do recommend changing to Xarelto and offer to assist him in getting. Will transition tomorrow  Recent history of pulmonary embolism In the previous admission on 02/13/2014, CT angiogram showed nonocclusive right lower lobe segmental pulmonary embolism. Transitioned to [place] as noted above. Discontinued aspirin.  Constipation Continue dulcolax suppository when necessary  Elevated troponin Stable. Likely due to demand ischemia from A. fib. Rate remains controlled. No chest pain. EKG with no acute changes.  Diabetes II: diet controlled. CBG trending up likely related to steroids. Wean steroids and continue SSI for optimal control.   Anemia: likely related to chronic disease. Chart review indicates current hg below baseline. Will obtain FOBT and  anemia panel.   Code Status: DNR Family Communication: Pt in room (indicate person spoken with, relationship, and if by phone, the number) Disposition Plan: Pending   Consultants:  Cardiology  Oncology  Procedures:    Antibiotics:  none (indicate start date, and stop date if known)  HPI/Subjective: No complaints.   Objective: Filed Vitals:   03/13/14 1943 03/13/14 2147 03/14/14 0450 03/14/14 0755  BP:  131/56 143/62   Pulse:  63 77   Temp:  97.9 F (36.6 C) 97.6 F (36.4 C)   TempSrc:  Oral Oral   Resp:  24 22   Height:      Weight:      SpO2: 96% 98% 100% 99%    Intake/Output Summary (Last 24 hours) at 03/14/14 0823 Last data filed at 03/14/14 0600  Gross per 24 hour  Intake    720 ml  Output   1650 ml  Net   -930 ml   Filed Weights   03/10/14 2232 03/11/14 0249  Weight: 82.555 kg (182 lb) 85.73 kg (189 lb)    Exam:   General:  Awake, in nad  Cardiovascular: regular, s1, s2  Respiratory: mildly increased resp effort, no wheezing  Abdomen: soft,nondistended  Musculoskeletal: perfused,no clubbing   Data Reviewed: Basic Metabolic Panel:  Recent Labs Lab 03/09/14 1158 03/10/14 2301 03/11/14 0306 03/13/14 0530 03/14/14 0506  NA 140 139 139 140 139  K 4.0 4.5 4.5 4.8 4.6  CL 107 106 107 109 106  CO2 26 25 24 25 28   GLUCOSE 117* 155* 178* 151* 109*  BUN 26* 31* 29* 31* 30*  CREATININE 1.27 1.18 1.16 1.19 1.14  CALCIUM 9.7 9.7 9.5 9.6 9.6   Liver Function Tests:  Recent Labs Lab 03/09/14 1158 03/11/14  0306  AST 42* 53*  ALT 59* 74*  ALKPHOS 101 89  BILITOT 0.7 0.5  PROT 7.8 7.0  ALBUMIN 4.8 4.3   No results for input(s): LIPASE, AMYLASE in the last 168 hours. No results for input(s): AMMONIA in the last 168 hours. CBC:  Recent Labs Lab 03/09/14 1158 03/10/14 2301 03/11/14 0306 03/13/14 0530 03/13/14 1133  WBC 5.5 4.0 3.8* 26.2* 9.7  NEUTROABS 2.4  --   --   --   --   HGB 11.7* 10.0* 10.3* 11.6* 11.6*  HCT 35.9*  30.5* 31.7* 36.8* 36.1*  MCV 104.7* 104.8* 105.0* 95.1 105.9*  PLT 153 138* 141* 302 174   Cardiac Enzymes:  Recent Labs Lab 03/09/14 1158 03/10/14 2301 03/11/14 0218 03/11/14 0747 03/11/14 1418  TROPONINI 0.18* 0.17* 0.18* 0.18* 0.17*   BNP (last 3 results)  Recent Labs  07/26/13 1128 11/16/13 0839 01/10/14 1046  PROBNP 2036.0* 770.7* 1366.0*   CBG:  Recent Labs Lab 03/13/14 0743 03/13/14 1141 03/13/14 1652 03/13/14 2117 03/14/14 0802  GLUCAP 151* 189* 144* 146* 96    No results found for this or any previous visit (from the past 240 hour(s)).   Studies: Dg Chest Port 1 View  03/13/2014   CLINICAL DATA:  Hypoxia.  EXAM: PORTABLE CHEST - 1 VIEW  COMPARISON:  None.  FINDINGS: Cardiomegaly and pulmonary vascular prominence and diffuse interstitial prominence consistent with congestive heart failure. Bilateral pleural effusions. Subsegmental atelectasis right upper lobe. No acute bony abnormality .  IMPRESSION: 1. Congestive heart failure with pulmonary edema and bilateral pleural effusions.  2.  Subsegment atelectasis right upper lobe.   Electronically Signed   By: Marcello Moores  Register   On: 03/13/2014 09:08    Scheduled Meds: . albuterol  2.5 mg Nebulization TID  . aspirin  81 mg Oral Daily  . atorvastatin  80 mg Oral q1800  . famotidine  20 mg Oral BID  . insulin aspart  0-5 Units Subcutaneous QHS  . insulin aspart  0-9 Units Subcutaneous TID WC  . levothyroxine  50 mcg Oral QAC breakfast  . predniSONE  40 mg Oral Q breakfast  . rivaroxaban  20 mg Oral Q breakfast  . sodium chloride  3 mL Intravenous Q12H  . sodium chloride  3 mL Intravenous Q12H   Continuous Infusions:   Active Problems:   HTN (hypertension)   Type 2 diabetes mellitus   Anemia   Dyspnea   Chronic combined systolic and diastolic CHF (congestive heart failure)   COPD (chronic obstructive pulmonary disease)   Hypertension   CKD (chronic kidney disease), stage III   Hypoxia   DOE (dyspnea  on exertion)   Acute pulmonary embolism  Time spent: 70min  CHIU, [place] [facility] Pager [phone]. If 7PM-7AM, please contact night-coverage at [email or web address], password [facility] 03/14/2014, 8:23 AM  LOS: 4 days

## 2014-03-15 LAB — CBC
HCT: 31.8 % — ABNORMAL LOW (ref 39.0–52.0)
Hemoglobin: 10.4 g/dL — ABNORMAL LOW (ref 13.0–17.0)
MCH: 34.2 pg — ABNORMAL HIGH (ref 26.0–34.0)
MCHC: 32.7 g/dL (ref 30.0–36.0)
MCV: 104.6 fL — AB (ref 78.0–100.0)
Platelets: 154 10*3/uL (ref 150–400)
RBC: 3.04 MIL/uL — ABNORMAL LOW (ref 4.22–5.81)
RDW: 15.3 % (ref 11.5–15.5)
WBC: 6.5 10*3/uL (ref 4.0–10.5)

## 2014-03-15 LAB — BASIC METABOLIC PANEL
ANION GAP: 8 (ref 5–15)
BUN: 35 mg/dL — AB (ref 6–23)
CHLORIDE: 103 mmol/L (ref 96–112)
CO2: 31 mmol/L (ref 19–32)
CREATININE: 1.1 mg/dL (ref 0.50–1.35)
Calcium: 9.8 mg/dL (ref 8.4–10.5)
GFR calc Af Amer: 66 mL/min — ABNORMAL LOW (ref >90)
GFR calc non Af Amer: 57 mL/min — ABNORMAL LOW (ref >90)
GLUCOSE: 112 mg/dL — AB (ref 70–99)
Potassium: 4.2 mmol/L (ref 3.5–5.1)
SODIUM: 142 mmol/L (ref 135–145)

## 2014-03-15 LAB — GLUCOSE, CAPILLARY
GLUCOSE-CAPILLARY: 93 mg/dL (ref 70–99)
GLUCOSE-CAPILLARY: 167 mg/dL — AB (ref 70–99)
GLUCOSE-CAPILLARY: 178 mg/dL — AB (ref 70–99)
Glucose-Capillary: 130 mg/dL — ABNORMAL HIGH (ref 70–99)

## 2014-03-15 MED ORDER — PREDNISONE 20 MG PO TABS
20.0000 mg | ORAL_TABLET | Freq: Every day | ORAL | Status: DC
  Administered 2014-03-16 – 2014-03-17 (×2): 20 mg via ORAL
  Filled 2014-03-15 (×2): qty 1

## 2014-03-15 NOTE — Progress Notes (Signed)
Patient ambulated 25 feet and became sob and stated he was tired and needed to sit down.  Patient was on 3 liters of oxygen.  Patient placed in wheelchair and assisted back to room.

## 2014-03-15 NOTE — Progress Notes (Signed)
[facility] PROGRESS NOTE  Bryce Mcdonald WPY:[id number] DOB: 02-22-1921 DOA: 03/10/2014 PCP: PROVIDER NOT IN SYSTEM  Assessment/Plan: Dyspnea on exertion secondary to likely acutely decompensated diastolic CHF Recent CT yields no obvious recurrent PE. Follow up chest xray yielded pulmonary edema and small bilateral pleural effusions.Started IV lasix. Monitor intake and output. Cont on daily 60mg  IV Lasix with good improvement thus far Wt on admission: 82.5kg Wt on 1/20: 85.7kg Wt on 1/23: 82.64kg Wt on 1/24: 80.6kg  Suspected COPD exacerbation: not on home oxygen reportedly. Will change steroids to po and taper quickly. ABG on 03/11/14 with pCO2 35 and pO2 101. See #1   Hypoxia See #1. Echo on 03/11/14 with severe LVH and EF 55%. Appreciate cardiology assistance  ? Left atrial thrombus CT angiogram raise the possibility of left atrial thrombus. Echo did not show obvious large thrombus. Evaluated by cardiology who recommend no further imaging and continuation of anticoagulation. They do recommend changing to Xarelto. Tolerating thus far  Recent history of pulmonary embolism In the previous admission on 02/13/2014, CT angiogram showed nonocclusive right lower lobe segmental pulmonary embolism. Transitioned to [place] as noted above. Discontinued aspirin.  Constipation Continue dulcolax suppository when necessary  Elevated troponin Stable. Likely due to demand ischemia from A. fib. Rate remains controlled. No chest pain. EKG with no acute changes.  Diabetes II: diet controlled. CBG trending up likely related to steroids. Wean steroids and continue SSI for optimal control.   Anemia: likely related to chronic disease. Chart review indicates current hg below baseline. Will obtain FOBT and anemia panel.   Code Status: DNR Family Communication: Pt in room (indicate person spoken with, relationship, and if by phone, the number) Disposition Plan:  Pending   Consultants:  Cardiology  Oncology  Procedures:    Antibiotics:  none (indicate start date, and stop date if known)  HPI/Subjective: No complaints. Feels better today  Objective: Filed Vitals:   03/15/14 0623 03/15/14 0625 03/15/14 0801 03/15/14 0900  BP: 150/115 141/73    Pulse: 59     Temp: 98 F (36.7 C)     TempSrc: Oral     Resp: 20     Height:    5\' 8"  (1.727 m)  Weight:    80.604 kg (177 lb 11.2 oz)  SpO2: 99%  100%     Intake/Output Summary (Last 24 hours) at 03/15/14 1304 Last data filed at 03/15/14 1109  Gross per 24 hour  Intake    246 ml  Output   1250 ml  Net  -1004 ml   Filed Weights   03/11/14 0249 03/14/14 0900 03/15/14 0900  Weight: 85.73 kg (189 lb) 82.645 kg (182 lb 3.2 oz) 80.604 kg (177 lb 11.2 oz)    Exam:   General:  Awake, in nad  Cardiovascular: regular, s1, s2  Respiratory: mildly increased resp effort, no wheezing  Abdomen: soft,nondistended  Musculoskeletal: perfused,no clubbing   Data Reviewed: Basic Metabolic Panel:  Recent Labs Lab 03/10/14 2301 03/11/14 0306 03/13/14 0530 03/14/14 0506 03/15/14 0512  NA 139 139 140 139 142  K 4.5 4.5 4.8 4.6 4.2  CL 106 107 109 106 103  CO2 25 24 25 28 31   GLUCOSE 155* 178* 151* 109* 112*  BUN 31* 29* 31* 30* 35*  CREATININE 1.18 1.16 1.19 1.14 1.10  CALCIUM 9.7 9.5 9.6 9.6 9.8   Liver Function Tests:  Recent Labs Lab 03/09/14 1158 03/11/14 0306  AST 42* 53*  ALT 59* 74*  ALKPHOS 101 89  BILITOT 0.7 0.5  PROT 7.8 7.0  ALBUMIN 4.8 4.3   No results for input(s): LIPASE, AMYLASE in the last 168 hours. No results for input(s): AMMONIA in the last 168 hours. CBC:  Recent Labs Lab 03/09/14 1158 03/10/14 2301 03/11/14 0306 03/13/14 0530 03/13/14 1133 03/15/14 0512  WBC 5.5 4.0 3.8* 26.2* 9.7 6.5  NEUTROABS 2.4  --   --   --   --   --   HGB 11.7* 10.0* 10.3* 11.6* 11.6* 10.4*  HCT 35.9* 30.5* 31.7* 36.8* 36.1* 31.8*  MCV 104.7* 104.8* 105.0*  95.1 105.9* 104.6*  PLT 153 138* 141* 302 174 154   Cardiac Enzymes:  Recent Labs Lab 03/09/14 1158 03/10/14 2301 03/11/14 0218 03/11/14 0747 03/11/14 1418  TROPONINI 0.18* 0.17* 0.18* 0.18* 0.17*   BNP (last 3 results)  Recent Labs  07/26/13 1128 11/16/13 0839 01/10/14 1046  PROBNP 2036.0* 770.7* 1366.0*   CBG:  Recent Labs Lab 03/14/14 1151 03/14/14 1648 03/14/14 2042 03/15/14 0851 03/15/14 1237  GLUCAP 116* 188* 211* 93 130*    No results found for this or any previous visit (from the past 240 hour(s)).   Studies: No results found.  Scheduled Meds: . albuterol  2.5 mg Nebulization TID  . aspirin  81 mg Oral Daily  . atorvastatin  80 mg Oral q1800  . famotidine  20 mg Oral BID  . furosemide  60 mg Intravenous Daily  . insulin aspart  0-5 Units Subcutaneous QHS  . insulin aspart  0-9 Units Subcutaneous TID WC  . levothyroxine  50 mcg Oral QAC breakfast  . predniSONE  40 mg Oral Q breakfast  . rivaroxaban  20 mg Oral Q breakfast  . sodium chloride  3 mL Intravenous Q12H  . sodium chloride  3 mL Intravenous Q12H   Continuous Infusions:   Active Problems:   HTN (hypertension)   Type 2 diabetes mellitus   Anemia   Dyspnea   Chronic combined systolic and diastolic CHF (congestive heart failure)   COPD (chronic obstructive pulmonary disease)   Hypertension   CKD (chronic kidney disease), stage III   Hypoxia   DOE (dyspnea on exertion)   Acute pulmonary embolism  Time spent: 68min  Nathaniel Yaden, [place] [facility] Pager [phone]. If 7PM-7AM, please contact night-coverage at [email or web address], password [facility] 03/15/2014, 1:04 PM  LOS: 5 days

## 2014-03-16 ENCOUNTER — Inpatient Hospital Stay (HOSPITAL_COMMUNITY): Payer: Non-veteran care

## 2014-03-16 LAB — BASIC METABOLIC PANEL
Anion gap: 7 (ref 5–15)
BUN: 33 mg/dL — AB (ref 6–23)
CHLORIDE: 98 mmol/L (ref 96–112)
CO2: 36 mmol/L — ABNORMAL HIGH (ref 19–32)
Calcium: 9.7 mg/dL (ref 8.4–10.5)
Creatinine, Ser: 1.14 mg/dL (ref 0.50–1.35)
GFR calc non Af Amer: 55 mL/min — ABNORMAL LOW (ref >90)
GFR, EST AFRICAN AMERICAN: 63 mL/min — AB (ref >90)
Glucose, Bld: 103 mg/dL — ABNORMAL HIGH (ref 70–99)
Potassium: 4.5 mmol/L (ref 3.5–5.1)
Sodium: 141 mmol/L (ref 135–145)

## 2014-03-16 LAB — CBC
HCT: 35.4 % — ABNORMAL LOW (ref 39.0–52.0)
Hemoglobin: 11.5 g/dL — ABNORMAL LOW (ref 13.0–17.0)
MCH: 33.7 pg (ref 26.0–34.0)
MCHC: 32.5 g/dL (ref 30.0–36.0)
MCV: 103.8 fL — AB (ref 78.0–100.0)
PLATELETS: 168 10*3/uL (ref 150–400)
RBC: 3.41 MIL/uL — ABNORMAL LOW (ref 4.22–5.81)
RDW: 15 % (ref 11.5–15.5)
WBC: 7.5 10*3/uL (ref 4.0–10.5)

## 2014-03-16 LAB — TROPONIN I
TROPONIN I: 0.2 ng/mL — AB (ref <0.031)
TROPONIN I: 0.2 ng/mL — AB (ref <0.031)
Troponin I: 0.21 ng/mL — ABNORMAL HIGH (ref <0.031)

## 2014-03-16 LAB — GLUCOSE, CAPILLARY
GLUCOSE-CAPILLARY: 140 mg/dL — AB (ref 70–99)
Glucose-Capillary: 118 mg/dL — ABNORMAL HIGH (ref 70–99)
Glucose-Capillary: 109 mg/dL — ABNORMAL HIGH (ref 70–99)
Glucose-Capillary: 165 mg/dL — ABNORMAL HIGH (ref 70–99)

## 2014-03-16 LAB — CLOSTRIDIUM DIFFICILE BY PCR: CDIFFPCR: NEGATIVE

## 2014-03-16 MED ORDER — MORPHINE SULFATE 2 MG/ML IJ SOLN
1.0000 mg | INTRAMUSCULAR | Status: DC | PRN
  Administered 2014-03-16: 1 mg via INTRAVENOUS
  Filled 2014-03-16: qty 1

## 2014-03-16 MED ORDER — NITROGLYCERIN 0.4 MG SL SUBL: 0.4000 mg | SUBLINGUAL_TABLET | SUBLINGUAL | Status: DC | PRN

## 2014-03-16 NOTE — Progress Notes (Signed)
Notified Dr. Wyline Copas via text message the troponin was 0.21 today from 0.17 on 1/20.

## 2014-03-16 NOTE — Progress Notes (Signed)
Went in to round on on [name] and he c/o right upper chest pain.  He rated it 8 on a scale of 0/10 and stated it was sharp. BP 134/77 HR 72  Ordered EKG per protocol and notified Dyanne Carrel, NP.    Late entry  [place] was given the EKG.  Morphine was given to the patient per order.

## 2014-03-16 NOTE — Progress Notes (Signed)
PT Cancellation Note  Patient Details Name: Bryce Mcdonald MRN: [id number] DOB: Jun 22, 1921   Cancelled Treatment:    Reason Eval/Treat Not Completed: Medical issues which prohibited therapy-Pt has been having increased difficulty breathing and is now c/o chest pain.  RN aware.   Demetrios Isaacs L 03/16/2014, 2:10 PM

## 2014-03-16 NOTE — Progress Notes (Signed)
Patient ambulated approximately 40 feet. Oxygen on at 4 liters via nasal cannula.  Will monitor patientl

## 2014-03-16 NOTE — Progress Notes (Signed)
[facility] PROGRESS NOTE  Bryce Mcdonald ERD:[id number] DOB: 1921/09/10 DOA: 03/10/2014 PCP: PROVIDER NOT IN SYSTEM  Assessment/Plan: Dyspnea on exertion secondary to likely acutely decompensated diastolic CHF  very little improvement. Will obtain 2 view chest xray evaluate pleural effusions.  Recent CT yields no obvious recurrent PE.  continue IV lasix.  Wt on admission: 82.5kg Wt on 1/20: 85.7kg Wt on 1/23: 82.64kg Wt on 1/24: 80.6kg Wt on 1/25 79.1kg  Suspected COPD exacerbation: not on home oxygen reportedly. Continue steroid taper quickly. ABG on 03/11/14 with pCO2 35 and pO2 101. See #1   Hypoxia See #1. Echo on 03/11/14 with severe LVH and EF 55%. Appreciate cardiology assistance  ? Left atrial thrombus CT angiogram raise the possibility of left atrial thrombus. Echo did not show obvious large thrombus. Evaluated by cardiology who recommend no further imaging and continuation of anticoagulation. They do recommend changing to Xarelto.   Recent history of pulmonary embolism In the previous admission on 02/13/2014, CT angiogram showed nonocclusive right lower lobe segmental pulmonary embolism. Transitioned to [place] as noted above. Discontinued aspirin.  Constipation Continue dulcolax suppository when necessary  Elevated troponin Stable. Likely due to demand ischemia from A. fib. Rate remains controlled. No chest pain. EKG with no acute changes.  Diabetes II: diet controlled. CBG range 118-178. Weaning steroids and continue SSI for optimal control.   Anemia: likely related to chronic disease. Stable.   Code Status: DNR Family Communication: none present Disposition Plan: home when ready   Consultants:  Cardiology  Oncology  Procedures:  none  Antibiotics:  none  HPI/Subjective: Reports continued sob when getting up to [facility]. Denies pain.   Objective: Filed Vitals:   03/16/14 0348  BP: 116/66  Pulse: 95  Temp: 98.2 F (36.8 C)  Resp: 20     Intake/Output Summary (Last 24 hours) at 03/16/14 0907 Last data filed at 03/16/14 0700  Gross per 24 hour  Intake    246 ml  Output   2100 ml  Net  -1854 ml   Filed Weights   03/14/14 0900 03/15/14 0900 03/16/14 0348  Weight: 82.645 kg (182 lb 3.2 oz) 80.604 kg (177 lb 11.2 oz) 79.153 kg (174 lb 8 oz)    Exam:   General:  Appears comfortable   Cardiovascular: RRR no m/g/r no LE edema  Respiratory: mild increased work of breathing with conversation BS quite diminished bilateral bases. No wheeze  Abdomen: non-distended +BS non-tender  Musculoskeletal: joints without swelling/erythema   Data Reviewed: Basic Metabolic Panel:  Recent Labs Lab 03/11/14 0306 03/13/14 0530 03/14/14 0506 03/15/14 0512 03/16/14 0623  NA 139 140 139 142 141  K 4.5 4.8 4.6 4.2 4.5  CL 107 109 106 103 98  CO2 24 25 28 31  36*  GLUCOSE 178* 151* 109* 112* 103*  BUN 29* 31* 30* 35* 33*  CREATININE 1.16 1.19 1.14 1.10 1.14  CALCIUM 9.5 9.6 9.6 9.8 9.7   Liver Function Tests:  Recent Labs Lab 03/09/14 1158 03/11/14 0306  AST 42* 53*  ALT 59* 74*  ALKPHOS 101 89  BILITOT 0.7 0.5  PROT 7.8 7.0  ALBUMIN 4.8 4.3   No results for input(s): LIPASE, AMYLASE in the last 168 hours. No results for input(s): AMMONIA in the last 168 hours. CBC:  Recent Labs Lab 03/09/14 1158  03/11/14 0306 03/13/14 0530 03/13/14 1133 03/15/14 0512 03/16/14 0623  WBC 5.5  < > 3.8* 26.2* 9.7 6.5 7.5  NEUTROABS 2.4  --   --   --   --   --   --  HGB 11.7*  < > 10.3* 11.6* 11.6* 10.4* 11.5*  HCT 35.9*  < > 31.7* 36.8* 36.1* 31.8* 35.4*  MCV 104.7*  < > 105.0* 95.1 105.9* 104.6* 103.8*  PLT 153  < > 141* 302 174 154 168  < > = values in this interval not displayed. Cardiac Enzymes:  Recent Labs Lab 03/09/14 1158 03/10/14 2301 03/11/14 0218 03/11/14 0747 03/11/14 1418  TROPONINI 0.18* 0.17* 0.18* 0.18* 0.17*   BNP (last 3 results)  Recent Labs  07/26/13 1128 11/16/13 0839 01/10/14 1046   PROBNP 2036.0* 770.7* 1366.0*   CBG:  Recent Labs Lab 03/15/14 0851 03/15/14 1237 03/15/14 1713 03/15/14 2155 03/16/14 0740  GLUCAP 93 130* 167* 178* 118*    No results found for this or any previous visit (from the past 240 hour(s)).   Studies: No results found.  Scheduled Meds: . albuterol  2.5 mg Nebulization TID  . aspirin  81 mg Oral Daily  . atorvastatin  80 mg Oral q1800  . famotidine  20 mg Oral BID  . furosemide  60 mg Intravenous Daily  . insulin aspart  0-5 Units Subcutaneous QHS  . insulin aspart  0-9 Units Subcutaneous TID WC  . levothyroxine  50 mcg Oral QAC breakfast  . predniSONE  20 mg Oral Q breakfast  . rivaroxaban  20 mg Oral Q breakfast  . sodium chloride  3 mL Intravenous Q12H  . sodium chloride  3 mL Intravenous Q12H   Continuous Infusions:   Active Problems:   HTN (hypertension)   Type 2 diabetes mellitus   Anemia   Dyspnea   Chronic combined systolic and diastolic CHF (congestive heart failure)   COPD (chronic obstructive pulmonary disease)   Hypertension   CKD (chronic kidney disease), stage III   Hypoxia   DOE (dyspnea on exertion)   Acute pulmonary embolism    Time spent: [place] [facility] Pager [phone]. If 7PM-7AM, please contact night-coverage at [email or web address], password [facility] 03/16/2014, 9:07 AM  LOS: 6 days

## 2014-03-16 NOTE — Progress Notes (Signed)
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  He stated that he felt dizzy and SOB at times.  He was currently on 2 LPM via N/C.  His O2 sat was 92% at this time but he stated he continued to be SOB without dizziness though.  O2 was changed to O2 at 3 LPM and the more he sat on the side of the bed the lower his sats got.  The reading read as low as 63 %.  O2 was turned back to 5 LPM and breathing exercises were started and his sats went bact to 98% on 5 LPM.  I notified Dyanne Carrel, NP of the incident.  She already had new orders placed.  The patient is stable at this time.

## 2014-03-17 ENCOUNTER — Inpatient Hospital Stay (HOSPITAL_COMMUNITY): Payer: Non-veteran care

## 2014-03-17 LAB — GLUCOSE, CAPILLARY
GLUCOSE-CAPILLARY: 221 mg/dL — AB (ref 70–99)
Glucose-Capillary: 88 mg/dL (ref 70–99)
Glucose-Capillary: 248 mg/dL — ABNORMAL HIGH (ref 70–99)
Glucose-Capillary: 160 mg/dL — ABNORMAL HIGH (ref 70–99)

## 2014-03-17 LAB — TROPONIN I
TROPONIN I: 0.21 ng/mL — AB (ref <0.031)
Troponin I: 0.22 ng/mL — ABNORMAL HIGH (ref <0.031)

## 2014-03-17 LAB — BLOOD GAS, ARTERIAL
Acid-Base Excess: 7.8 mmol/L — ABNORMAL HIGH (ref 0.0–2.0)
Bicarbonate: 31.7 mEq/L — ABNORMAL HIGH (ref 20.0–24.0)
Drawn by: 234301
O2 CONTENT: 3 L/min
O2 Saturation: 91 %
Patient temperature: 37
TCO2: 27.7 mmol/L (ref 0–100)
pCO2 arterial: 43.7 mmHg (ref 35.0–45.0)
pH, Arterial: 7.474 — ABNORMAL HIGH (ref 7.350–7.450)
pO2, Arterial: 62.6 mmHg — ABNORMAL LOW (ref 80.0–100.0)

## 2014-03-17 LAB — BASIC METABOLIC PANEL
ANION GAP: 7 (ref 5–15)
BUN: 33 mg/dL — ABNORMAL HIGH (ref 6–23)
CALCIUM: 9.5 mg/dL (ref 8.4–10.5)
CHLORIDE: 96 mmol/L (ref 96–112)
CO2: 34 mmol/L — AB (ref 19–32)
Creatinine, Ser: 1.16 mg/dL (ref 0.50–1.35)
GFR calc Af Amer: 62 mL/min — ABNORMAL LOW (ref >90)
GFR, EST NON AFRICAN AMERICAN: 54 mL/min — AB (ref >90)
Glucose, Bld: 118 mg/dL — ABNORMAL HIGH (ref 70–99)
Potassium: 4.4 mmol/L (ref 3.5–5.1)
SODIUM: 137 mmol/L (ref 135–145)

## 2014-03-17 MED ORDER — FUROSEMIDE 20 MG PO TABS
20.0000 mg | ORAL_TABLET | Freq: Every day | ORAL | Status: DC
  Administered 2014-03-18 – 2014-03-20 (×3): 20 mg via ORAL
  Filled 2014-03-17 (×3): qty 1

## 2014-03-17 MED ORDER — INSULIN ASPART 100 UNIT/ML ~~LOC~~ SOLN
0.0000 [IU] | Freq: Three times a day (TID) | SUBCUTANEOUS | Status: DC
  Administered 2014-03-17 – 2014-03-18 (×2): 7 [IU] via SUBCUTANEOUS
  Administered 2014-03-18: 3 [IU] via SUBCUTANEOUS
  Administered 2014-03-19: 15 [IU] via SUBCUTANEOUS
  Administered 2014-03-19 (×2): 3 [IU] via SUBCUTANEOUS
  Administered 2014-03-20: 4 [IU] via SUBCUTANEOUS
  Administered 2014-03-20: 11 [IU] via SUBCUTANEOUS

## 2014-03-17 MED ORDER — LORATADINE 10 MG PO TABS
10.0000 mg | ORAL_TABLET | Freq: Every day | ORAL | Status: DC
  Administered 2014-03-17 – 2014-03-20 (×4): 10 mg via ORAL
  Filled 2014-03-17 (×4): qty 1

## 2014-03-17 MED ORDER — METHYLPREDNISOLONE SODIUM SUCC 125 MG IJ SOLR
60.0000 mg | Freq: Four times a day (QID) | INTRAMUSCULAR | Status: DC
  Administered 2014-03-17 – 2014-03-19 (×9): 60 mg via INTRAVENOUS
  Filled 2014-03-17 (×9): qty 2

## 2014-03-17 NOTE — Progress Notes (Signed)
[facility] PROGRESS NOTE  Bryce Mcdonald AGT:[id number] DOB: 21-Apr-1921 DOA: 03/10/2014 PCP: PROVIDER NOT IN SYSTEM  Assessment/Plan: Dyspnea on exertion secondary to likely acutely decompensated diastolic CHF in setting of COPD No improvement. Repeat 2 view chest xray stable.  Recent CT yields no obvious recurrent PE.Wt today 77.3kg from 85.7kg. Urine output good. Will transition lasix to po. Monitor.   Suspected COPD exacerbation: not on home oxygen reportedly. See #1. Escalate to solumedrol.    Hypoxia See #1. Echo on 03/11/14 with severe LVH and EF 55%. Will repeat ABG and bilateral LE dopplar to evaluate worsening DVT.   ? Left atrial thrombus CT angiogram raise the possibility of left atrial thrombus. Echo did not show obvious large thrombus. Evaluated by cardiology who recommend no further imaging and continuation of anticoagulation. They do recommend changing to Xarelto.   Recent history of pulmonary embolism In the previous admission on 02/13/2014, CT angiogram showed nonocclusive right lower lobe segmental pulmonary embolism. Transitioned to [place] as noted above. Discontinued aspirin.  Constipation Continue dulcolax suppository when necessary  Elevated troponin Stable. Likely due to demand ischemia from A. fib. Rate remains controlled. No chest pain. EKG with no acute changes.  Diabetes II: diet controlled. CBG range 118-178. Weaning steroids and continue SSI for optimal control.   Anemia: likely related to chronic disease. Stable.   Code Status: DNR Family Communication: none present Disposition Plan: home when ready   Consultants:  Cardiology  Oncology  pulmonology  Procedures:  none  Antibiotics:  none  HPI/Subjective: Sitting on side of bed reports continued DOE  Objective: Filed Vitals:   03/17/14 1400  BP: 146/80  Pulse: 57  Temp: 98 F (36.7 C)  Resp: 20    Intake/Output Summary (Last 24 hours) at 03/17/14 1553 Last data  filed at 03/17/14 1200  Gross per 24 hour  Intake    480 ml  Output   1400 ml  Net   -920 ml   Filed Weights   03/15/14 0900 03/16/14 0348 03/17/14 0455  Weight: 80.604 kg (177 lb 11.2 oz) 79.153 kg (174 lb 8 oz) 77.338 kg (170 lb 8 oz)    Exam:   General:  Calm cooperative   Cardiovascular: RRR no MGR no LE edema  Respiratory: mild increased work of breathing with repositioning self in bed and conversation. BS remain diminished  Abdomen: non distended +BS   Musculoskeletal: no clubbing or cyanosis   Data Reviewed: Basic Metabolic Panel:  Recent Labs Lab 03/13/14 0530 03/14/14 0506 03/15/14 0512 03/16/14 0623 03/17/14 0229  NA 140 139 142 141 137  K 4.8 4.6 4.2 4.5 4.4  CL 109 106 103 98 96  CO2 25 28 31  36* 34*  GLUCOSE 151* 109* 112* 103* 118*  BUN 31* 30* 35* 33* 33*  CREATININE 1.19 1.14 1.10 1.14 1.16  CALCIUM 9.6 9.6 9.8 9.7 9.5   Liver Function Tests:  Recent Labs Lab 03/11/14 0306  AST 53*  ALT 74*  ALKPHOS 89  BILITOT 0.5  PROT 7.0  ALBUMIN 4.3   No results for input(s): LIPASE, AMYLASE in the last 168 hours. No results for input(s): AMMONIA in the last 168 hours. CBC:  Recent Labs Lab 03/11/14 0306 03/13/14 0530 03/13/14 1133 03/15/14 0512 03/16/14 0623  WBC 3.8* 26.2* 9.7 6.5 7.5  HGB 10.3* 11.6* 11.6* 10.4* 11.5*  HCT 31.7* 36.8* 36.1* 31.8* 35.4*  MCV 105.0* 95.1 105.9* 104.6* 103.8*  PLT 141* 302 174 154 168   Cardiac Enzymes:  Recent  Labs Lab 03/16/14 1423 03/16/14 2006 03/16/14 2230 03/17/14 0229 03/17/14 0706  TROPONINI 0.21* 0.20* 0.20* 0.22* 0.21*   BNP (last 3 results)  Recent Labs  07/26/13 1128 11/16/13 0839 01/10/14 1046  PROBNP 2036.0* 770.7* 1366.0*   CBG:  Recent Labs Lab 03/16/14 1112 03/16/14 1640 03/16/14 2145 03/17/14 0732 03/17/14 1117  GLUCAP 109* 165* 140* 88 248*    Recent Results (from the past 240 hour(s))  Clostridium Difficile by PCR     Status: None   Collection Time:  03/16/14 10:34 AM  Result Value Ref Range Status   C difficile by pcr NEGATIVE NEGATIVE Final     Studies: Dg Chest 2 View  03/16/2014   CLINICAL DATA:  Dyspnea on exertion  EXAM: CHEST  2 VIEW  COMPARISON:  March 13, 2014 chest radiograph and chest CT March 10, 2014  FINDINGS: There is underlying emphysematous change. There is scarring in both lower lung zones as well as in the posterior segment right upper lobe. A 6 mm nodular opacity in the right middle lobe is unchanged from recent CT. There is no frank edema or consolidation. Heart size is within normal limits. Pulmonary vascularity reflects underlying emphysema. No adenopathy. There is atherosclerotic change in aorta. No bone lesions.  IMPRESSION: Underlying emphysematous change with areas of scarring. Small nodular opacity right middle lobe, stable. No edema or consolidation. No new lung opacity. No change in cardiac silhouette.   Electronically Signed   By: Lowella Grip M.D.   On: 03/16/2014 10:22    Scheduled Meds: . albuterol  2.5 mg Nebulization TID  . aspirin  81 mg Oral Daily  . atorvastatin  80 mg Oral q1800  . famotidine  20 mg Oral BID  . furosemide  60 mg Intravenous Daily  . insulin aspart  0-5 Units Subcutaneous QHS  . insulin aspart  0-9 Units Subcutaneous TID WC  . levothyroxine  50 mcg Oral QAC breakfast  . methylPREDNISolone (SOLU-MEDROL) injection  60 mg Intravenous Q6H  . rivaroxaban  20 mg Oral Q breakfast  . sodium chloride  3 mL Intravenous Q12H  . sodium chloride  3 mL Intravenous Q12H   Continuous Infusions:   Active Problems:   HTN (hypertension)   Type 2 diabetes mellitus   Anemia   Dyspnea   Chronic combined systolic and diastolic CHF (congestive heart failure)   COPD (chronic obstructive pulmonary disease)   Hypertension   CKD (chronic kidney disease), stage III   Hypoxia   DOE (dyspnea on exertion)   Acute pulmonary embolism    Time spent: 35 minutes    [facility] Pager [phone]. If 7PM-7AM, please contact night-coverage at [email or web address], password [facility] 03/17/2014, 3:53 PM  LOS: 7 days

## 2014-03-17 NOTE — Progress Notes (Signed)
Patient knows how to cut off bed alarm.

## 2014-03-17 NOTE — Consult Note (Signed)
Consult requested by: [facility] Consult requested for hypoxia:  HPI: This is a 81 year old with a complicated medical history of recurrent DVT and recurrent pulmonary emboli. He also has COPD and chronic combined systolic and diastolic heart failure. He has pulmonary hypertension by echocardiogram. He has valvular heart disease. He came to the hospital after increasing problems with shortness of breath. He had recently been hospitalized with another pulmonary embolus and was sent home on twice a day Lovenox. He has failed Coumadin and xarelto. He has been hypoxic. CT did not show new pulmonary emboli  Past Medical History  Diagnosis Date  . Colon cancer     Status post partial colectomy  . Hypertension   . Hypercholesterolemia   . Blood transfusion   . DVT (deep venous thrombosis)     a. Prior h/o such. b. Recurrent subacute bilat DVT 2014.  . PE (pulmonary embolism)   . Diabetes mellitus   . Chronic atrial fibrillation   . Diabetes mellitus, type II   . Ventral hernia   . Unspecified hypothyroidism   . LVH (left ventricular hypertrophy)   . Bilateral renal cysts   . Calculus of left kidney     non-obstructing  . COPD (chronic obstructive pulmonary disease)   . Chronic combined systolic and diastolic CHF (congestive heart failure)     a. Echo 07/2013: EF 40-50%, mod LVH, mild MR, mildly dilated LA, mild-mod TR, PA pressure 13mmHg.  Marland Kitchen RBBB with left anterior fascicular block   . Bradycardia   . Pulmonary hypertension     a. Echo 07/2013: PASP 51mmHg.  . Mild mitral regurgitation     a. By echo 07/2013.  . Tricuspid regurgitation     a. Mild-mod by echo 07/2013.  . Orthostatic hypotension   . Dementia   . CAD (coronary artery disease)     a. Nonobstructive by cath in 2008. b. Nuc 08/2013: low to intermediate risk. Imaging c/w scar in the inferior wall without any large ischemic territories. LVEF 37% with fairly diffuse hypokinesis.   . Syncope   . Ventral hernia   .  Pulmonary nodules   . CKD (chronic kidney disease), stage III     a. based on historical lab data.     Family History  Problem Relation Age of Onset  . Hypertension       History   Social History  . Marital Status: Widowed    Spouse Name: N/A    Number of Children: 8  . Years of Education: N/A   Social History Main Topics  . Smoking status: Former Smoker -- 0.50 packs/day    Types: Cigarettes    Start date: 11/15/1943    Quit date: 02/20/1973  . Smokeless tobacco: Former Systems developer    Types: Chew     Comment: quit over 40 years ago  . Alcohol Use: No     Comment: quit over forty years ago  . Drug Use: No  . Sexual Activity: No   Other Topics Concern  . None   Social History Narrative     ROS: He is not having any chest pain. No hemoptysis. He's been coughing a little bit. The rest as per the history and physical    Objective: Vital signs in last 24 hours: Temp:  [98.1 F (36.7 C)-98.8 F (37.1 C)] 98.4 F (36.9 C) (01/26 0455) Pulse Rate:  [61-75] 75 (01/26 0455) Resp:  [20] 20 (01/26 0455) BP: (107-168)/(57-96) 168/71 mmHg (01/26 0455) SpO2:  [95 %-99 %]  96 % (01/26 0735) Weight:  [77.338 kg (170 lb 8 oz)] 77.338 kg (170 lb 8 oz) (01/26 0455) Weight change: -3.266 kg (-7 lb 3.2 oz) Last BM Date: 03/16/14  Intake/Output from previous day: 01/25 0701 - 01/26 0700 In: 600 [P.O.:600] Out: 1875 [Urine:1875]  PHYSICAL EXAM In general he looks comfortable and younger than his stated age. He has multiple missing teeth. His pupils react. His nose and throat are clear. His neck is supple without masses. His chest shows clear with decreased breath sounds. His heart is regular to exam with soft murmur. His abdomen is soft without masses. Extremities showed no edema. Central nervous system exam is grossly intact  Lab Results: Basic Metabolic Panel:  Recent Labs  03/16/14 0623 03/17/14 0229  NA 141 137  K 4.5 4.4  CL 98 96  CO2 36* 34*  GLUCOSE 103* 118*  BUN  33* 33*  CREATININE 1.14 1.16  CALCIUM 9.7 9.5   Liver Function Tests: No results for input(s): AST, ALT, ALKPHOS, BILITOT, PROT, ALBUMIN in the last 72 hours. No results for input(s): LIPASE, AMYLASE in the last 72 hours. No results for input(s): AMMONIA in the last 72 hours. CBC:  Recent Labs  03/15/14 0512 03/16/14 0623  WBC 6.5 7.5  HGB 10.4* 11.5*  HCT 31.8* 35.4*  MCV 104.6* 103.8*  PLT 154 168   Cardiac Enzymes:  Recent Labs  03/16/14 2230 03/17/14 0229 03/17/14 0706  TROPONINI 0.20* 0.22* 0.21*   BNP: No results for input(s): PROBNP in the last 72 hours. D-Dimer: No results for input(s): DDIMER in the last 72 hours. CBG:  Recent Labs  03/15/14 2155 03/16/14 0740 03/16/14 1112 03/16/14 1640 03/16/14 2145 03/17/14 0732  GLUCAP 178* 118* 109* 165* 140* 88   Hemoglobin A1C: No results for input(s): HGBA1C in the last 72 hours. Fasting Lipid Panel: No results for input(s): CHOL, HDL, LDLCALC, TRIG, CHOLHDL, LDLDIRECT in the last 72 hours. Thyroid Function Tests: No results for input(s): TSH, T4TOTAL, FREET4, T3FREE, THYROIDAB in the last 72 hours. Anemia Panel: No results for input(s): VITAMINB12, FOLATE, FERRITIN, TIBC, IRON, RETICCTPCT in the last 72 hours. Coagulation: No results for input(s): LABPROT, INR in the last 72 hours. Urine Drug Screen: Drugs of Abuse  No results found for: LABOPIA, COCAINSCRNUR, LABBENZ, AMPHETMU, THCU, LABBARB  Alcohol Level: No results for input(s): ETH in the last 72 hours. Urinalysis: No results for input(s): COLORURINE, LABSPEC, PHURINE, GLUCOSEU, HGBUR, BILIRUBINUR, KETONESUR, PROTEINUR, UROBILINOGEN, NITRITE, LEUKOCYTESUR in the last 72 hours.  Invalid input(s): APPERANCEUR Misc. Labs:   ABGS: No results for input(s): PHART, PO2ART, TCO2, HCO3 in the last 72 hours.  Invalid input(s): PCO2   MICROBIOLOGY: Recent Results (from the past 240 hour(s))  Clostridium Difficile by PCR     Status: None    Collection Time: 03/16/14 10:34 AM  Result Value Ref Range Status   C difficile by pcr NEGATIVE NEGATIVE Final    Studies/Results: Dg Chest 2 View  03/16/2014   CLINICAL DATA:  Dyspnea on exertion  EXAM: CHEST  2 VIEW  COMPARISON:  March 13, 2014 chest radiograph and chest CT March 10, 2014  FINDINGS: There is underlying emphysematous change. There is scarring in both lower lung zones as well as in the posterior segment right upper lobe. A 6 mm nodular opacity in the right middle lobe is unchanged from recent CT. There is no frank edema or consolidation. Heart size is within normal limits. Pulmonary vascularity reflects underlying emphysema. No adenopathy. There is  atherosclerotic change in aorta. No bone lesions.  IMPRESSION: Underlying emphysematous change with areas of scarring. Small nodular opacity right middle lobe, stable. No edema or consolidation. No new lung opacity. No change in cardiac silhouette.   Electronically Signed   By: Lowella Grip M.D.   On: 03/16/2014 10:22    Medications:  Prior to Admission:  Prescriptions prior to admission  Medication Sig Dispense Refill Last Dose  . albuterol (PROVENTIL HFA) 108 (90 BASE) MCG/ACT inhaler Inhale 2 puffs into the lungs every 6 (six) hours as needed for wheezing or shortness of breath.   Past Month at Unknown time  . aspirin 81 MG chewable tablet Chew 81 mg by mouth daily.   03/10/2014 at Unknown time  . atorvastatin (LIPITOR) 80 MG tablet Take 80 mg by mouth daily.   03/10/2014 at Unknown time  . enoxaparin (LOVENOX) 80 MG/0.8ML injection Inject 0.8 mLs (80 mg total) into the skin every 12 (twelve) hours. 60 Syringe 1 03/10/2014 at Unknown time  . famotidine (PEPCID) 20 MG tablet Take 1 tablet (20 mg total) by mouth 2 (two) times daily.   03/10/2014 at Unknown time  . furosemide (LASIX) 20 MG tablet Take 1 tablet (20 mg total) by mouth daily as needed for fluid or edema. (Patient taking differently: Take 20 mg by mouth daily. ) 90  tablet 3 03/10/2014 at Unknown time  . levothyroxine (SYNTHROID, LEVOTHROID) 50 MCG tablet Take 50 mcg by mouth daily before breakfast.   03/10/2014 at Unknown time  . predniSONE (DELTASONE) 10 MG tablet Take 6 tablets (60 mg total) by mouth daily. 30 tablet 0 03/10/2014 at Unknown time  . vitamin B-12 (CYANOCOBALAMIN) 1000 MCG tablet Take 1,000 mcg by mouth daily.   03/10/2014 at Unknown time  . acetaminophen (TYLENOL) 325 MG tablet Take 650 mg by mouth every 6 (six) hours as needed for mild pain or fever.   unknown   Scheduled: . albuterol  2.5 mg Nebulization TID  . aspirin  81 mg Oral Daily  . atorvastatin  80 mg Oral q1800  . famotidine  20 mg Oral BID  . furosemide  60 mg Intravenous Daily  . insulin aspart  0-5 Units Subcutaneous QHS  . insulin aspart  0-9 Units Subcutaneous TID WC  . levothyroxine  50 mcg Oral QAC breakfast  . predniSONE  20 mg Oral Q breakfast  . rivaroxaban  20 mg Oral Q breakfast  . sodium chloride  3 mL Intravenous Q12H  . sodium chloride  3 mL Intravenous Q12H   Continuous:  [facility]:ENIDPO chloride, acetaminophen, albuterol, morphine injection, nitroGLYCERIN, ondansetron **OR** ondansetron (ZOFRAN) IV, sodium chloride  Assesment: He has been hypoxic and I think it's from a number of problems including COPD exacerbation, previous pulmonary emboli pulmonary hypertension and chronic systolic and diastolic heart failure. He is better now. I would continue treatment for COPD. Active Problems:   HTN (hypertension)   Type 2 diabetes mellitus   Anemia   Dyspnea   Chronic combined systolic and diastolic CHF (congestive heart failure)   COPD (chronic obstructive pulmonary disease)   Hypertension   CKD (chronic kidney disease), stage III   Hypoxia   DOE (dyspnea on exertion)   Acute pulmonary embolism    Plan: No change in medications. Continue with COPD treatment    LOS: 7 days   Raylee Adamec L 03/17/2014, 8:52 AM

## 2014-03-18 DIAGNOSIS — J441 Chronic obstructive pulmonary disease with (acute) exacerbation: Secondary | ICD-10-CM

## 2014-03-18 DIAGNOSIS — J9620 Acute and chronic respiratory failure, unspecified whether with hypoxia or hypercapnia: Secondary | ICD-10-CM | POA: Diagnosis present

## 2014-03-18 DIAGNOSIS — J9601 Acute respiratory failure with hypoxia: Secondary | ICD-10-CM

## 2014-03-18 LAB — CBC
HEMATOCRIT: 40.8 % (ref 39.0–52.0)
Hemoglobin: 13.4 g/dL (ref 13.0–17.0)
MCH: 33.1 pg (ref 26.0–34.0)
MCHC: 32.8 g/dL (ref 30.0–36.0)
MCV: 100.7 fL — AB (ref 78.0–100.0)
Platelets: 194 10*3/uL (ref 150–400)
RBC: 4.05 MIL/uL — AB (ref 4.22–5.81)
RDW: 14.4 % (ref 11.5–15.5)
WBC: 6.9 10*3/uL (ref 4.0–10.5)

## 2014-03-18 LAB — BASIC METABOLIC PANEL
Anion gap: 11 (ref 5–15)
BUN: 37 mg/dL — ABNORMAL HIGH (ref 6–23)
CO2: 32 mmol/L (ref 19–32)
Calcium: 9.9 mg/dL (ref 8.4–10.5)
Chloride: 94 mmol/L — ABNORMAL LOW (ref 96–112)
Creatinine, Ser: 1.13 mg/dL (ref 0.50–1.35)
GFR calc Af Amer: 64 mL/min — ABNORMAL LOW (ref >90)
GFR, EST NON AFRICAN AMERICAN: 55 mL/min — AB (ref >90)
Glucose, Bld: 159 mg/dL — ABNORMAL HIGH (ref 70–99)
Potassium: 4.7 mmol/L (ref 3.5–5.1)
Sodium: 137 mmol/L (ref 135–145)

## 2014-03-18 LAB — GLUCOSE, CAPILLARY
GLUCOSE-CAPILLARY: 141 mg/dL — AB (ref 70–99)
GLUCOSE-CAPILLARY: 221 mg/dL — AB (ref 70–99)
Glucose-Capillary: 113 mg/dL — ABNORMAL HIGH (ref 70–99)
Glucose-Capillary: 137 mg/dL — ABNORMAL HIGH (ref 70–99)

## 2014-03-18 MED ORDER — DEXTROSE 5 % IV SOLN
500.0000 mg | INTRAVENOUS | Status: DC
  Administered 2014-03-18 – 2014-03-19 (×2): 500 mg via INTRAVENOUS
  Filled 2014-03-18 (×3): qty 500

## 2014-03-18 MED ORDER — INSULIN ASPART 100 UNIT/ML ~~LOC~~ SOLN
4.0000 [IU] | Freq: Three times a day (TID) | SUBCUTANEOUS | Status: DC
  Administered 2014-03-18 – 2014-03-20 (×8): 4 [IU] via SUBCUTANEOUS

## 2014-03-18 NOTE — Progress Notes (Signed)
NUTRITION BRIEF NOTE Patient identified due to day 8 length of stay  Filed Weights   03/16/14 0348 03/17/14 0455 03/18/14 0603  Weight: 174 lb 8 oz (79.153 kg) 170 lb 8 oz (77.338 kg) 168 lb (76.204 kg)    Body mass index is 27.1 kg/(m^2). Pt meets criteria for Overweight based on current BMI.   Current diet order is Heart Healthy, patient is consuming 75-100% of meals at this time. Labs and medications reviewed.   Pt denied any changes in appetite PTA, and reported healthy appetite; can feed self. He is enjoying hospital food, and does not require additional supplementation. Pt was consuming vitamin B12 MVI at home.   Previous medical records indicate stable weight 165-175 lbs in past 3 months. Pt denied any significant wt changes, or unintentional wt loss.  Per RN, pt is eating well, and has had no difficulty chewing or swallowing foods.   No nutrition interventions warranted at this time. If nutrition issues arise, please consult RD.  Wynona Dove, MS Dietetic Intern Pager: [phone]

## 2014-03-18 NOTE — Progress Notes (Signed)
Subjective: He says he feels okay. His breathing is doing better. He is coughing and coughed up some purulent sputum while I was examining him  Objective: Vital signs in last 24 hours: Temp:  [97.8 F (36.6 C)-98.6 F (37 C)] 97.8 F (36.6 C) (01/27 0604) Pulse Rate:  [57-77] 64 (01/27 0604) Resp:  [18-20] 18 (01/27 0604) BP: (142-146)/(74-88) 143/88 mmHg (01/27 0604) SpO2:  [94 %-97 %] 95 % (01/27 0712) Weight:  [76.204 kg (168 lb)] 76.204 kg (168 lb) (01/27 0603) Weight change: -1.134 kg (-2 lb 8 oz) Last BM Date: 03/16/14  Intake/Output from previous day: 01/26 0701 - 01/27 0700 In: 720 [P.O.:720] Out: 1700 [Urine:1700]  PHYSICAL EXAM General appearance: alert, cooperative and no distress Resp: rhonchi bilaterally Cardio: regular rate and rhythm, S1, S2 normal, no murmur, click, rub or gallop GI: soft, non-tender; bowel sounds normal; no masses,  no organomegaly Extremities: extremities normal, atraumatic, no cyanosis or edema  Lab Results:  Results for orders placed or performed during the hospital encounter of 03/10/14 (from the past 48 hour(s))  Clostridium Difficile by PCR     Status: None   Collection Time: 03/16/14 10:34 AM  Result Value Ref Range   C difficile by pcr NEGATIVE NEGATIVE  Glucose, capillary     Status: Abnormal   Collection Time: 03/16/14 11:12 AM  Result Value Ref Range   Glucose-Capillary 109 (H) 70 - 99 mg/dL  Troponin I     Status: Abnormal   Collection Time: 03/16/14  2:23 PM  Result Value Ref Range   Troponin I 0.21 (H) <0.031 ng/mL    Comment:        PERSISTENTLY INCREASED TROPONIN VALUES IN THE RANGE OF 0.04-0.49 ng/mL CAN BE SEEN IN:       -UNSTABLE ANGINA       -CONGESTIVE HEART FAILURE       -MYOCARDITIS       -CHEST TRAUMA       -ARRYHTHMIAS       -LATE PRESENTING MYOCARDIAL INFARCTION       -COPD   CLINICAL FOLLOW-UP RECOMMENDED.   Glucose, capillary     Status: Abnormal   Collection Time: 03/16/14  4:40 PM  Result Value  Ref Range   Glucose-Capillary 165 (H) 70 - 99 mg/dL  Troponin I (q 6hr x 3)     Status: Abnormal   Collection Time: 03/16/14  8:06 PM  Result Value Ref Range   Troponin I 0.20 (H) <0.031 ng/mL    Comment:        PERSISTENTLY INCREASED TROPONIN VALUES IN THE RANGE OF 0.04-0.49 ng/mL CAN BE SEEN IN:       -UNSTABLE ANGINA       -CONGESTIVE HEART FAILURE       -MYOCARDITIS       -CHEST TRAUMA       -ARRYHTHMIAS       -LATE PRESENTING MYOCARDIAL INFARCTION       -COPD   CLINICAL FOLLOW-UP RECOMMENDED.   Glucose, capillary     Status: Abnormal   Collection Time: 03/16/14  9:45 PM  Result Value Ref Range   Glucose-Capillary 140 (H) 70 - 99 mg/dL  Troponin I     Status: Abnormal   Collection Time: 03/16/14 10:30 PM  Result Value Ref Range   Troponin I 0.20 (H) <0.031 ng/mL    Comment:        PERSISTENTLY INCREASED TROPONIN VALUES IN THE RANGE OF 0.04-0.49 ng/mL CAN BE SEEN IN:       -  UNSTABLE ANGINA       -CONGESTIVE HEART FAILURE       -MYOCARDITIS       -CHEST TRAUMA       -ARRYHTHMIAS       -LATE PRESENTING MYOCARDIAL INFARCTION       -COPD   CLINICAL FOLLOW-UP RECOMMENDED.   Basic metabolic panel     Status: Abnormal   Collection Time: 03/17/14  2:29 AM  Result Value Ref Range   Sodium 137 135 - 145 mmol/L   Potassium 4.4 3.5 - 5.1 mmol/L   Chloride 96 96 - 112 mmol/L   CO2 34 (H) 19 - 32 mmol/L   Glucose, Bld 118 (H) 70 - 99 mg/dL   BUN 33 (H) 6 - 23 mg/dL   Creatinine, Ser 1.16 0.50 - 1.35 mg/dL   Calcium 9.5 8.4 - 10.5 mg/dL   GFR calc non Af Amer 54 (L) >90 mL/min   GFR calc Af Amer 62 (L) >90 mL/min    Comment: (NOTE) The eGFR has been calculated using the CKD EPI equation. This calculation has not been validated in all clinical situations. eGFR's persistently <90 mL/min signify possible Chronic Kidney Disease.    Anion gap 7 5 - 15  Troponin I (q 6hr x 3)     Status: Abnormal   Collection Time: 03/17/14  2:29 AM  Result Value Ref Range   Troponin I  0.22 (H) <0.031 ng/mL    Comment:        PERSISTENTLY INCREASED TROPONIN VALUES IN THE RANGE OF 0.04-0.49 ng/mL CAN BE SEEN IN:       -UNSTABLE ANGINA       -CONGESTIVE HEART FAILURE       -MYOCARDITIS       -CHEST TRAUMA       -ARRYHTHMIAS       -LATE PRESENTING MYOCARDIAL INFARCTION       -COPD   CLINICAL FOLLOW-UP RECOMMENDED.   Troponin I (q 6hr x 3)     Status: Abnormal   Collection Time: 03/17/14  7:06 AM  Result Value Ref Range   Troponin I 0.21 (H) <0.031 ng/mL    Comment:        PERSISTENTLY INCREASED TROPONIN VALUES IN THE RANGE OF 0.04-0.49 ng/mL CAN BE SEEN IN:       -UNSTABLE ANGINA       -CONGESTIVE HEART FAILURE       -MYOCARDITIS       -CHEST TRAUMA       -ARRYHTHMIAS       -LATE PRESENTING MYOCARDIAL INFARCTION       -COPD   CLINICAL FOLLOW-UP RECOMMENDED.   Glucose, capillary     Status: None   Collection Time: 03/17/14  7:32 AM  Result Value Ref Range   Glucose-Capillary 88 70 - 99 mg/dL   Comment 1 Documented in Chart    Comment 2 Notify RN   Glucose, capillary     Status: Abnormal   Collection Time: 03/17/14 11:17 AM  Result Value Ref Range   Glucose-Capillary 248 (H) 70 - 99 mg/dL   Comment 1 Documented in Chart    Comment 2 Notify RN   Glucose, capillary     Status: Abnormal   Collection Time: 03/17/14  4:21 PM  Result Value Ref Range   Glucose-Capillary 221 (H) 70 - 99 mg/dL   Comment 1 Documented in Chart    Comment 2 Notify RN   Blood gas, arterial     Status:  Abnormal   Collection Time: 03/17/14  5:15 PM  Result Value Ref Range   O2 Content 3.0 L/min   Delivery systems NASAL CANNULA    pH, Arterial 7.474 (H) 7.350 - 7.450   pCO2 arterial 43.7 35.0 - 45.0 mmHg   pO2, Arterial 62.6 (L) 80.0 - 100.0 mmHg   Bicarbonate 31.7 (H) 20.0 - 24.0 mEq/L   TCO2 27.7 0 - 100 mmol/L   Acid-Base Excess 7.8 (H) 0.0 - 2.0 mmol/L   O2 Saturation 91.0 %   Patient temperature 37.0    Collection site RIGHT BRACHIAL    Drawn by [id number]    Sample type  ARTERIAL   Glucose, capillary     Status: Abnormal   Collection Time: 03/17/14  9:33 PM  Result Value Ref Range   Glucose-Capillary 160 (H) 70 - 99 mg/dL   Comment 1 Notify RN   Basic metabolic panel     Status: Abnormal   Collection Time: 03/18/14  6:33 AM  Result Value Ref Range   Sodium 137 135 - 145 mmol/L   Potassium 4.7 3.5 - 5.1 mmol/L   Chloride 94 (L) 96 - 112 mmol/L   CO2 32 19 - 32 mmol/L   Glucose, Bld 159 (H) 70 - 99 mg/dL   BUN 37 (H) 6 - 23 mg/dL   Creatinine, Ser 1.13 0.50 - 1.35 mg/dL   Calcium 9.9 8.4 - 10.5 mg/dL   GFR calc non Af Amer 55 (L) >90 mL/min   GFR calc Af Amer 64 (L) >90 mL/min    Comment: (NOTE) The eGFR has been calculated using the CKD EPI equation. This calculation has not been validated in all clinical situations. eGFR's persistently <90 mL/min signify possible Chronic Kidney Disease.    Anion gap 11 5 - 15  CBC     Status: Abnormal   Collection Time: 03/18/14  6:33 AM  Result Value Ref Range   WBC 6.9 4.0 - 10.5 K/uL   RBC 4.05 (L) 4.22 - 5.81 MIL/uL   Hemoglobin 13.4 13.0 - 17.0 g/dL   HCT 40.8 39.0 - 52.0 %   MCV 100.7 (H) 78.0 - 100.0 fL   MCH 33.1 26.0 - 34.0 pg   MCHC 32.8 30.0 - 36.0 g/dL   RDW 14.4 11.5 - 15.5 %   Platelets 194 150 - 400 K/uL  Glucose, capillary     Status: Abnormal   Collection Time: 03/18/14  7:26 AM  Result Value Ref Range   Glucose-Capillary 141 (H) 70 - 99 mg/dL    ABGS  Recent Labs  03/17/14 1715  PHART 7.474*  PO2ART 62.6*  TCO2 27.7  HCO3 31.7*   CULTURES Recent Results (from the past 240 hour(s))  Clostridium Difficile by PCR     Status: None   Collection Time: 03/16/14 10:34 AM  Result Value Ref Range Status   C difficile by pcr NEGATIVE NEGATIVE Final   Studies/Results: Dg Chest 2 View  03/16/2014   CLINICAL DATA:  Dyspnea on exertion  EXAM: CHEST  2 VIEW  COMPARISON:  March 13, 2014 chest radiograph and chest CT March 10, 2014  FINDINGS: There is underlying emphysematous change.  There is scarring in both lower lung zones as well as in the posterior segment right upper lobe. A 6 mm nodular opacity in the right middle lobe is unchanged from recent CT. There is no frank edema or consolidation. Heart size is within normal limits. Pulmonary vascularity reflects underlying emphysema. No adenopathy. There is atherosclerotic  change in aorta. No bone lesions.  IMPRESSION: Underlying emphysematous change with areas of scarring. Small nodular opacity right middle lobe, stable. No edema or consolidation. No new lung opacity. No change in cardiac silhouette.   Electronically Signed   By: Lowella Grip M.D.   On: 03/16/2014 10:22    Medications:  Prior to Admission:  Prescriptions prior to admission  Medication Sig Dispense Refill Last Dose  . albuterol (PROVENTIL HFA) 108 (90 BASE) MCG/ACT inhaler Inhale 2 puffs into the lungs every 6 (six) hours as needed for wheezing or shortness of breath.   Past Month at Unknown time  . aspirin 81 MG chewable tablet Chew 81 mg by mouth daily.   03/10/2014 at Unknown time  . atorvastatin (LIPITOR) 80 MG tablet Take 80 mg by mouth daily.   03/10/2014 at Unknown time  . enoxaparin (LOVENOX) 80 MG/0.8ML injection Inject 0.8 mLs (80 mg total) into the skin every 12 (twelve) hours. 60 Syringe 1 03/10/2014 at Unknown time  . famotidine (PEPCID) 20 MG tablet Take 1 tablet (20 mg total) by mouth 2 (two) times daily.   03/10/2014 at Unknown time  . furosemide (LASIX) 20 MG tablet Take 1 tablet (20 mg total) by mouth daily as needed for fluid or edema. (Patient taking differently: Take 20 mg by mouth daily. ) 90 tablet 3 03/10/2014 at Unknown time  . levothyroxine (SYNTHROID, LEVOTHROID) 50 MCG tablet Take 50 mcg by mouth daily before breakfast.   03/10/2014 at Unknown time  . predniSONE (DELTASONE) 10 MG tablet Take 6 tablets (60 mg total) by mouth daily. 30 tablet 0 03/10/2014 at Unknown time  . vitamin B-12 (CYANOCOBALAMIN) 1000 MCG tablet Take 1,000 mcg by  mouth daily.   03/10/2014 at Unknown time  . acetaminophen (TYLENOL) 325 MG tablet Take 650 mg by mouth every 6 (six) hours as needed for mild pain or fever.   unknown   Scheduled: . albuterol  2.5 mg Nebulization TID  . aspirin  81 mg Oral Daily  . atorvastatin  80 mg Oral q1800  . azithromycin  500 mg Intravenous Q24H  . famotidine  20 mg Oral BID  . furosemide  20 mg Oral Daily  . insulin aspart  0-20 Units Subcutaneous TID WC  . levothyroxine  50 mcg Oral QAC breakfast  . loratadine  10 mg Oral Daily  . methylPREDNISolone (SOLU-MEDROL) injection  60 mg Intravenous Q6H  . rivaroxaban  20 mg Oral Q breakfast  . sodium chloride  3 mL Intravenous Q12H  . sodium chloride  3 mL Intravenous Q12H   Continuous:  IEP:PIRJJO chloride, acetaminophen, albuterol, morphine injection, nitroGLYCERIN, ondansetron **OR** ondansetron (ZOFRAN) IV, sodium chloride  Assesment: He was admitted with acute shortness of breath and I think is related to COPD exacerbation. He coughed up some purulent sputum while I was examining him. He also has history of pulmonary emboli, and history of combined systolic and diastolic CHF. He seems to be generally improving Active Problems:   HTN (hypertension)   Type 2 diabetes mellitus   Anemia   Dyspnea   Chronic combined systolic and diastolic CHF (congestive heart failure)   COPD (chronic obstructive pulmonary disease)   Hypertension   CKD (chronic kidney disease), stage III   Hypoxia   DOE (dyspnea on exertion)   Acute pulmonary embolism    Plan: I added Zithromax because of his purulent sputum.    LOS: 8 days   , L 03/18/2014, 8:25 AM

## 2014-03-18 NOTE — Progress Notes (Signed)
Inpatient Diabetes Program Recommendations  AACE/ADA: New Consensus Statement on Inpatient Glycemic Control (2013)  Target Ranges:  Prepandial:   less than 140 mg/dL      Peak postprandial:   less than 180 mg/dL (1-2 hours)      Critically ill patients:  140 - 180 mg/dL   Results for TIMOTY, BOURKE (MRN [id number]) as of 03/18/2014 07:59  Ref. Range 03/17/2014 07:32 03/17/2014 11:17 03/17/2014 16:21 03/17/2014 21:33 03/18/2014 07:26  Glucose-Capillary Latest Range: 70-99 mg/dL 88 248 (H) 221 (H) 160 (H) 141 (H)   Diabetes history: DM2 Outpatient Diabetes medications: No Current orders for Inpatient glycemic control: Novolog 0-20 units TID with meals  Inpatient Diabetes Program Recommendations Insulin - Meal Coverage: While ordered steroids, please consider ordering Novolog 4 units TID with meals for meal coverage (in addition to Novolog correction).  Thanks, Barnie Alderman, RN, MSN, CCRN, CDE Diabetes Coordinator Inpatient Diabetes Program [phone] (Team Pager) [phone] ([place] office) [phone] [facility] office)

## 2014-03-18 NOTE — Progress Notes (Signed)
Physical Therapy Treatment Patient Details Name: Bryce Mcdonald MRN: [id number] DOB: 1921/12/07 Today's Date: 03/18/2014    History of Present Illness  81 year old male who  has a past medical history of Colon cancer; Hypertension; Hypercholesterolemia; Blood transfusion; DVT (deep venous thrombosis); PE (pulmonary embolism); Diabetes mellitus; Chronic atrial fibrillation; Diabetes mellitus, type II; Ventral hernia; Unspecified hypothyroidism; LVH (left ventricular hypertrophy); Bilateral renal cysts; Calculus of left kidney; COPD (chronic obstructive pulmonary disease); Chronic combined systolic and diastolic CHF (congestive heart failure); RBBB with left anterior fascicular block; Bradycardia; Pulmonary hypertension; Mild mitral regurgitation; Tricuspid regurgitation; Orthostatic hypotension; Dementia; CAD (coronary artery disease); Syncope; Ventral hernia; Pulmonary nodules; and CKD (chronic kidney disease), stage III.  Patient was recently discharged from hospital on 02/17/2014 after he was diagnosed with pulmonary embolism, patient was discharged on Lovenox injection twice a day. Yesterday patient came to the ED with shortness of breath and was discharged home with prednisone and nebulizer treatment for COPD exacerbation. As per patient he has been getting worsening of shortness of breath on exertion, and today also complains of lower abdominal pain. Patient was found on the floor at the group home, though he denies passing out. Patient says that he hasn't moved his bowels for past 3 days.    PT Comments    Pt agreeable to treatment today, though states "i don't know how well i'm going to do" secondary to breathing issues.  Gait distance self limited today secondary to SOB/fatigue with activity and pt only able to amb 40 feet despite continuous O2 use at 3L.  Spoke with case management and pt was approved for O2 to use at home. Pt did demonstrate good tolerance with sitting and standing therapeutic  exercises with O2 sats remaining ~93% during exercises.  D/C plans remain appropriate, as pt states he has an aide for 2 hours a day during the week, and his daughter is trying to find him other help during the day (son lives above him and daughter live nearby and are able to help as needed).    Follow Up Recommendations  Home health PT     Equipment Recommendations  None recommended by PT       Precautions / Restrictions Precautions Precautions: Fall Restrictions Weight Bearing Restrictions: No    Mobility  Bed Mobility Overal bed mobility: Modified Independent                Transfers Overall transfer level: Needs assistance   Transfers: Sit to/from Stand Sit to Stand: Supervision         General transfer comment: VC for upright posturing, and straightening knees  Ambulation/Gait Ambulation/Gait assistance: Modified independent (Device/Increase time) Ambulation Distance (Feet): 40 Feet Assistive device: Rolling walker (2 wheeled) Gait Pattern/deviations: Trunk flexed;Step-through pattern   Gait velocity interpretation: at or above normal speed for age/gender General Gait Details: VC for upright posture and straightening knees to avoid crouched posturing     Balance Overall balance assessment: No apparent balance deficits (not formally assessed)                                  Cognition Arousal/Alertness: Awake/alert Behavior During Therapy: WFL for tasks assessed/performed Overall Cognitive Status: Within Functional Limits for tasks assessed                      Exercises General Exercises - Lower Extremity Ankle Circles/Pumps: AROM;Both;10 reps;Supine Long Arc Quad: AROM;Both;10 reps;Seated  Straight Leg Raises: AROM;Both;10 reps;Supine Hip Flexion/Marching: AROM;Both;10 reps;Seated Toe Raises: AROM;Both;10 reps;Seated Heel Raises: AROM;Both;10 reps;Standing Mini-Sqauts: AROM;Both;10 reps;Standing    General Comments         Pertinent Vitals/Pain Pain Assessment: 0-10           PT Goals (current goals can now be found in the care plan section) Progress towards PT goals: Progressing toward goals    Frequency  Min 3X/week    PT Plan Current plan remains appropriate       End of Session Equipment Utilized During Treatment: Gait belt Activity Tolerance: Patient limited by fatigue Patient left: in bed;with call bell/phone within reach;with bed alarm set     Time: 1345-1416 PT Time Calculation (min) (ACUTE ONLY): 31 min  Charges:  $Gait Training: 8-22 mins $Therapeutic Exercise: 8-22 mins                    Lonna Cobb, DPT [phone]  03/18/2014, 2:54 PM

## 2014-03-18 NOTE — Progress Notes (Signed)
[facility] PROGRESS NOTE  Deryk Bozman ZOX:[id number] DOB: Dec 31, 1921 DOA: 03/10/2014 PCP: PROVIDER NOT IN SYSTEM  Assessment/Plan: Dyspnea on exertion secondary to likely acutely decompensated diastolic CHF in setting of COPD some improvement.Wt today 76.3kg from 85.7kg. Urine output good. Continue  lasix to po. Monitor.   Suspected COPD exacerbation: not on home oxygen reportedly. See #1. Increased sputum production. continue solumedrol and antibiotics. Appreciate pulmonology assistance. Will mobilize and monitor. Will likely need home o2 at discharge.   Hypoxia See #1. Echo on 03/11/14 with severe LVH and EF 55%. Repeat ABG with pO2 62.6. PCO2 43.7. See above  ? Left atrial thrombus CT angiogram raise the possibility of left atrial thrombus. Echo did not show obvious large thrombus. Evaluated by cardiology who recommend no further imaging and continuation of anticoagulation. They do recommend changing to Xarelto.   Recent history of pulmonary embolism In the previous admission on 02/13/2014, CT angiogram showed nonocclusive right lower lobe segmental pulmonary embolism. Transitioned to [place] as noted above. Discontinued aspirin.  Constipation Continue dulcolax suppository when necessary  Elevated troponin Stable. Likely due to demand ischemia from A. fib. Rate remains controlled. No chest pain. EKG with no acute changes.  Diabetes II: diet controlled. CBG range 118-178. Weaning steroids and continue SSI for optimal control.   1. Anemia: likely related to chronic disease. Stable.   Code Status: DNR Family Communication: daughter Disposition Plan: home hopefully 24-48 hours   Consultants:  Cardiology  Oncology  pulmonology  Procedures:  none  Antibiotics:  none  HPI/Subjective: Reports feeling a little better this am. Denies pain/discomfort or sob at rest  Objective: Filed Vitals:   03/18/14 0604  BP: 143/88  Pulse: 64  Temp: 97.8 F (36.6  C)  Resp: 18    Intake/Output Summary (Last 24 hours) at 03/18/14 1120 Last data filed at 03/18/14 0900  Gross per 24 hour  Intake    840 ml  Output   1600 ml  Net   -760 ml   Filed Weights   03/16/14 0348 03/17/14 0455 03/18/14 0603  Weight: 79.153 kg (174 lb 8 oz) 77.338 kg (170 lb 8 oz) 76.204 kg (168 lb)    Exam:   General:  Appears comfortable  Cardiovascular: RRR No MGR no LE edema PPP  Respiratory: normal effort BS with improved air flow and diffuse rhonchi no wheeze  Abdomen: soft +BS non-tender  Musculoskeletal: no clubbing or cyanosis   Data Reviewed: Basic Metabolic Panel:  Recent Labs Lab 03/14/14 0506 03/15/14 0512 03/16/14 0623 03/17/14 0229 03/18/14 0633  NA 139 142 141 137 137  K 4.6 4.2 4.5 4.4 4.7  CL 106 103 98 96 94*  CO2 28 31 36* 34* 32  GLUCOSE 109* 112* 103* 118* 159*  BUN 30* 35* 33* 33* 37*  CREATININE 1.14 1.10 1.14 1.16 1.13  CALCIUM 9.6 9.8 9.7 9.5 9.9   Liver Function Tests: No results for input(s): AST, ALT, ALKPHOS, BILITOT, PROT, ALBUMIN in the last 168 hours. No results for input(s): LIPASE, AMYLASE in the last 168 hours. No results for input(s): AMMONIA in the last 168 hours. CBC:  Recent Labs Lab 03/13/14 0530 03/13/14 1133 03/15/14 0512 03/16/14 0623 03/18/14 0633  WBC 26.2* 9.7 6.5 7.5 6.9  HGB 11.6* 11.6* 10.4* 11.5* 13.4  HCT 36.8* 36.1* 31.8* 35.4* 40.8  MCV 95.1 105.9* 104.6* 103.8* 100.7*  PLT 302 174 154 168 194   Cardiac Enzymes:  Recent Labs Lab 03/16/14 1423 03/16/14 2006 03/16/14 2230 03/17/14 0229 03/17/14  2620  TROPONINI 0.21* 0.20* 0.20* 0.22* 0.21*   BNP (last 3 results)  Recent Labs  07/26/13 1128 11/16/13 0839 01/10/14 1046  PROBNP 2036.0* 770.7* 1366.0*   CBG:  Recent Labs Lab 03/17/14 0732 03/17/14 1117 03/17/14 1621 03/17/14 2133 03/18/14 0726  GLUCAP 88 248* 221* 160* 141*    Recent Results (from the past 240 hour(s))  Clostridium Difficile by PCR     Status:  None   Collection Time: 03/16/14 10:34 AM  Result Value Ref Range Status   C difficile by pcr NEGATIVE NEGATIVE Final     Studies: US Venous Img Lower Bilateral  03/18/2014   CLINICAL DATA:  Bilateral lower extremity DVT. Cramping. Fell 1 week ago. History of tobacco abuse.  EXAM: BILATERAL LOWER EXTREMITY VENOUS DOPPLER ULTRASOUND  TECHNIQUE: Gray-scale sonography with compression, as well as color and duplex ultrasound, were performed to evaluate the deep venous system from the level of the common femoral vein through the popliteal and proximal calf veins.  COMPARISON:  10/10/2012  FINDINGS: ON the right, incompletely compressible thrombus at the saphenous femoral junction into the common femoral vein, and in the profunda femoral vein near its confluence with the common femoral. There is flow signal across this area these areas on color Doppler interrogation. Femoral and popliteal veins unremarkable. Visualized segments of the saphenous venous system normal in caliber and compressibility.  On the left, there is chronic mural thrombus across the saphenous femoral junction into the common femoral vein, in the deep femoral vein near its confluence with common femoral, and through the length of femoral vein l. There is flow through these segments on color Doppler interrogation. Popliteal vein in visualized calf veins unremarkable. Visualized segments of the saphenous venous system normal in caliber and compressibility.  IMPRESSION: 1. Chronic nonocclusive mural thrombus in RIGHT profunda femoral and common femoral veins; and LEFT femoral, deep femoral, and common femoral veins. The distribution is similar to that seen on prior study. 2. No evidence of acute/superimposed DVT.   Electronically Signed   By: Arne Cleveland M.D.   On: 03/18/2014 09:45    Scheduled Meds: . albuterol  2.5 mg Nebulization TID  . aspirin  81 mg Oral Daily  . atorvastatin  80 mg Oral q1800  . azithromycin  500 mg Intravenous  Q24H  . famotidine  20 mg Oral BID  . furosemide  20 mg Oral Daily  . insulin aspart  0-20 Units Subcutaneous TID WC  . levothyroxine  50 mcg Oral QAC breakfast  . loratadine  10 mg Oral Daily  . methylPREDNISolone (SOLU-MEDROL) injection  60 mg Intravenous Q6H  . rivaroxaban  20 mg Oral Q breakfast  . sodium chloride  3 mL Intravenous Q12H  . sodium chloride  3 mL Intravenous Q12H   Continuous Infusions:   Active Problems:   HTN (hypertension)   Type 2 diabetes mellitus   Anemia   Dyspnea   Chronic combined systolic and diastolic CHF (congestive heart failure)   COPD (chronic obstructive pulmonary disease)   Hypertension   CKD (chronic kidney disease), stage III   Hypoxia   DOE (dyspnea on exertion)   Acute pulmonary embolism    Time spent: 35 minutes    [facility] Pager [phone]. If 7PM-7AM, please contact night-coverage at [email or web address], password [facility] 03/18/2014, 11:20 AM  LOS: 8 days

## 2014-03-19 LAB — BASIC METABOLIC PANEL
Anion gap: 9 (ref 5–15)
BUN: 41 mg/dL — ABNORMAL HIGH (ref 6–23)
CALCIUM: 10.1 mg/dL (ref 8.4–10.5)
CHLORIDE: 96 mmol/L (ref 96–112)
CO2: 33 mmol/L — ABNORMAL HIGH (ref 19–32)
CREATININE: 1.18 mg/dL (ref 0.50–1.35)
GFR calc Af Amer: 61 mL/min — ABNORMAL LOW (ref >90)
GFR calc non Af Amer: 52 mL/min — ABNORMAL LOW (ref >90)
Glucose, Bld: 151 mg/dL — ABNORMAL HIGH (ref 70–99)
Potassium: 5 mmol/L (ref 3.5–5.1)
Sodium: 138 mmol/L (ref 135–145)

## 2014-03-19 LAB — GLUCOSE, CAPILLARY
GLUCOSE-CAPILLARY: 150 mg/dL — AB (ref 70–99)
Glucose-Capillary: 467 mg/dL — ABNORMAL HIGH (ref 70–99)
Glucose-Capillary: 201 mg/dL — ABNORMAL HIGH (ref 70–99)
Glucose-Capillary: 131 mg/dL — ABNORMAL HIGH (ref 70–99)

## 2014-03-19 LAB — GLUCOSE, RANDOM: GLUCOSE: 350 mg/dL — AB (ref 70–99)

## 2014-03-19 MED ORDER — PREDNISONE 20 MG PO TABS
40.0000 mg | ORAL_TABLET | Freq: Every day | ORAL | Status: DC
  Administered 2014-03-20: 40 mg via ORAL
  Filled 2014-03-19: qty 2

## 2014-03-19 MED ORDER — AZITHROMYCIN 250 MG PO TABS
500.0000 mg | ORAL_TABLET | Freq: Every day | ORAL | Status: DC
  Administered 2014-03-20: 500 mg via ORAL
  Filled 2014-03-19: qty 2

## 2014-03-19 NOTE — Progress Notes (Signed)
UR chart review completed.  

## 2014-03-19 NOTE — Progress Notes (Signed)
Subjective: He feels better and wants to go home. He has no new complaints. He is still coughing some but not coughing anything up now  Objective: Vital signs in last 24 hours: Temp:  [97.7 F (36.5 C)-97.8 F (36.6 C)] 97.7 F (36.5 C) (01/28 0619) Pulse Rate:  [54-77] 54 (01/28 0619) Resp:  [16-18] 16 (01/28 0619) BP: (117-130)/(65-71) 117/71 mmHg (01/28 0619) SpO2:  [84 %-99 %] 90 % (01/28 0724) Weight:  [75.1 kg (165 lb 9.1 oz)] 75.1 kg (165 lb 9.1 oz) (01/28 0619) Weight change: -1.104 kg (-2 lb 7 oz) Last BM Date: 03/16/14  Intake/Output from previous day: 01/27 0701 - 01/28 0700 In: 720 [P.O.:720] Out: 750 [Urine:750]  PHYSICAL EXAM General appearance: alert, cooperative and no distress Resp: rhonchi bilaterally Cardio: regular rate and rhythm, S1, S2 normal, no murmur, click, rub or gallop GI: soft, non-tender; bowel sounds normal; no masses,  no organomegaly Extremities: extremities normal, atraumatic, no cyanosis or edema  Lab Results:  Results for orders placed or performed during the hospital encounter of 03/10/14 (from the past 48 hour(s))  Glucose, capillary     Status: Abnormal   Collection Time: 03/17/14 11:17 AM  Result Value Ref Range   Glucose-Capillary 248 (H) 70 - 99 mg/dL   Comment 1 Documented in Chart    Comment 2 Notify RN   Glucose, capillary     Status: Abnormal   Collection Time: 03/17/14  4:21 PM  Result Value Ref Range   Glucose-Capillary 221 (H) 70 - 99 mg/dL   Comment 1 Documented in Chart    Comment 2 Notify RN   Blood gas, arterial     Status: Abnormal   Collection Time: 03/17/14  5:15 PM  Result Value Ref Range   O2 Content 3.0 L/min   Delivery systems NASAL CANNULA    pH, Arterial 7.474 (H) 7.350 - 7.450   pCO2 arterial 43.7 35.0 - 45.0 mmHg   pO2, Arterial 62.6 (L) 80.0 - 100.0 mmHg   Bicarbonate 31.7 (H) 20.0 - 24.0 mEq/L   TCO2 27.7 0 - 100 mmol/L   Acid-Base Excess 7.8 (H) 0.0 - 2.0 mmol/L   O2 Saturation 91.0 %   Patient  temperature 37.0    Collection site RIGHT BRACHIAL    Drawn by [id number]    Sample type ARTERIAL   Glucose, capillary     Status: Abnormal   Collection Time: 03/17/14  9:33 PM  Result Value Ref Range   Glucose-Capillary 160 (H) 70 - 99 mg/dL   Comment 1 Notify RN   Basic metabolic panel     Status: Abnormal   Collection Time: 03/18/14  6:33 AM  Result Value Ref Range   Sodium 137 135 - 145 mmol/L   Potassium 4.7 3.5 - 5.1 mmol/L   Chloride 94 (L) 96 - 112 mmol/L   CO2 32 19 - 32 mmol/L   Glucose, Bld 159 (H) 70 - 99 mg/dL   BUN 37 (H) 6 - 23 mg/dL   Creatinine, Ser 1.13 0.50 - 1.35 mg/dL   Calcium 9.9 8.4 - 10.5 mg/dL   GFR calc non Af Amer 55 (L) >90 mL/min   GFR calc Af Amer 64 (L) >90 mL/min    Comment: (NOTE) The eGFR has been calculated using the CKD EPI equation. This calculation has not been validated in all clinical situations. eGFR's persistently <90 mL/min signify possible Chronic Kidney Disease.    Anion gap 11 5 - 15  CBC  Status: Abnormal   Collection Time: 03/18/14  6:33 AM  Result Value Ref Range   WBC 6.9 4.0 - 10.5 K/uL   RBC 4.05 (L) 4.22 - 5.81 MIL/uL   Hemoglobin 13.4 13.0 - 17.0 g/dL   HCT 40.8 39.0 - 52.0 %   MCV 100.7 (H) 78.0 - 100.0 fL   MCH 33.1 26.0 - 34.0 pg   MCHC 32.8 30.0 - 36.0 g/dL   RDW 14.4 11.5 - 15.5 %   Platelets 194 150 - 400 K/uL  Glucose, capillary     Status: Abnormal   Collection Time: 03/18/14  7:26 AM  Result Value Ref Range   Glucose-Capillary 141 (H) 70 - 99 mg/dL  Glucose, capillary     Status: Abnormal   Collection Time: 03/18/14 11:24 AM  Result Value Ref Range   Glucose-Capillary 221 (H) 70 - 99 mg/dL  Glucose, capillary     Status: Abnormal   Collection Time: 03/18/14  4:45 PM  Result Value Ref Range   Glucose-Capillary 113 (H) 70 - 99 mg/dL  Glucose, capillary     Status: Abnormal   Collection Time: 03/18/14  8:27 PM  Result Value Ref Range   Glucose-Capillary 137 (H) 70 - 99 mg/dL   Comment 1 Notify RN    Basic metabolic panel     Status: Abnormal   Collection Time: 03/19/14  6:07 AM  Result Value Ref Range   Sodium 138 135 - 145 mmol/L   Potassium 5.0 3.5 - 5.1 mmol/L   Chloride 96 96 - 112 mmol/L   CO2 33 (H) 19 - 32 mmol/L   Glucose, Bld 151 (H) 70 - 99 mg/dL   BUN 41 (H) 6 - 23 mg/dL   Creatinine, Ser 1.18 0.50 - 1.35 mg/dL   Calcium 10.1 8.4 - 10.5 mg/dL   GFR calc non Af Amer 52 (L) >90 mL/min   GFR calc Af Amer 61 (L) >90 mL/min    Comment: (NOTE) The eGFR has been calculated using the CKD EPI equation. This calculation has not been validated in all clinical situations. eGFR's persistently <90 mL/min signify possible Chronic Kidney Disease.    Anion gap 9 5 - 15  Glucose, capillary     Status: Abnormal   Collection Time: 03/19/14  7:16 AM  Result Value Ref Range   Glucose-Capillary 150 (H) 70 - 99 mg/dL   Comment 1 Documented in Chart    Comment 2 Notify RN     ABGS  Recent Labs  03/17/14 1715  PHART 7.474*  PO2ART 62.6*  TCO2 27.7  HCO3 31.7*   CULTURES Recent Results (from the past 240 hour(s))  Clostridium Difficile by PCR     Status: None   Collection Time: 03/16/14 10:34 AM  Result Value Ref Range Status   C difficile by pcr NEGATIVE NEGATIVE Final   Studies/Results: US Venous Img Lower Bilateral  03/18/2014   CLINICAL DATA:  Bilateral lower extremity DVT. Cramping. Fell 1 week ago. History of tobacco abuse.  EXAM: BILATERAL LOWER EXTREMITY VENOUS DOPPLER ULTRASOUND  TECHNIQUE: Gray-scale sonography with compression, as well as color and duplex ultrasound, were performed to evaluate the deep venous system from the level of the common femoral vein through the popliteal and proximal calf veins.  COMPARISON:  10/10/2012  FINDINGS: ON the right, incompletely compressible thrombus at the saphenous femoral junction into the common femoral vein, and in the profunda femoral vein near its confluence with the common femoral. There is flow signal across this  area  these areas on color Doppler interrogation. Femoral and popliteal veins unremarkable. Visualized segments of the saphenous venous system normal in caliber and compressibility.  On the left, there is chronic mural thrombus across the saphenous femoral junction into the common femoral vein, in the deep femoral vein near its confluence with common femoral, and through the length of femoral vein l. There is flow through these segments on color Doppler interrogation. Popliteal vein in visualized calf veins unremarkable. Visualized segments of the saphenous venous system normal in caliber and compressibility.  IMPRESSION: 1. Chronic nonocclusive mural thrombus in RIGHT profunda femoral and common femoral veins; and LEFT femoral, deep femoral, and common femoral veins. The distribution is similar to that seen on prior study. 2. No evidence of acute/superimposed DVT.   Electronically Signed   By: Arne Cleveland M.D.   On: 03/18/2014 09:45    Medications:  Prior to Admission:  Prescriptions prior to admission  Medication Sig Dispense Refill Last Dose  . albuterol (PROVENTIL HFA) 108 (90 BASE) MCG/ACT inhaler Inhale 2 puffs into the lungs every 6 (six) hours as needed for wheezing or shortness of breath.   Past Month at Unknown time  . aspirin 81 MG chewable tablet Chew 81 mg by mouth daily.   03/10/2014 at Unknown time  . atorvastatin (LIPITOR) 80 MG tablet Take 80 mg by mouth daily.   03/10/2014 at Unknown time  . enoxaparin (LOVENOX) 80 MG/0.8ML injection Inject 0.8 mLs (80 mg total) into the skin every 12 (twelve) hours. 60 Syringe 1 03/10/2014 at Unknown time  . famotidine (PEPCID) 20 MG tablet Take 1 tablet (20 mg total) by mouth 2 (two) times daily.   03/10/2014 at Unknown time  . furosemide (LASIX) 20 MG tablet Take 1 tablet (20 mg total) by mouth daily as needed for fluid or edema. (Patient taking differently: Take 20 mg by mouth daily. ) 90 tablet 3 03/10/2014 at Unknown time  . levothyroxine (SYNTHROID,  LEVOTHROID) 50 MCG tablet Take 50 mcg by mouth daily before breakfast.   03/10/2014 at Unknown time  . predniSONE (DELTASONE) 10 MG tablet Take 6 tablets (60 mg total) by mouth daily. 30 tablet 0 03/10/2014 at Unknown time  . vitamin B-12 (CYANOCOBALAMIN) 1000 MCG tablet Take 1,000 mcg by mouth daily.   03/10/2014 at Unknown time  . acetaminophen (TYLENOL) 325 MG tablet Take 650 mg by mouth every 6 (six) hours as needed for mild pain or fever.   unknown   Scheduled: . albuterol  2.5 mg Nebulization TID  . aspirin  81 mg Oral Daily  . atorvastatin  80 mg Oral q1800  . azithromycin  500 mg Intravenous Q24H  . famotidine  20 mg Oral BID  . furosemide  20 mg Oral Daily  . insulin aspart  0-20 Units Subcutaneous TID WC  . insulin aspart  4 Units Subcutaneous TID WC  . levothyroxine  50 mcg Oral QAC breakfast  . loratadine  10 mg Oral Daily  . methylPREDNISolone (SOLU-MEDROL) injection  60 mg Intravenous Q6H  . rivaroxaban  20 mg Oral Q breakfast  . sodium chloride  3 mL Intravenous Q12H  . sodium chloride  3 mL Intravenous Q12H   Continuous:  QDI:YMEBRA chloride, acetaminophen, albuterol, morphine injection, nitroGLYCERIN, ondansetron **OR** ondansetron (ZOFRAN) IV, sodium chloride  Assesment: He has shortness of breath and hypoxia which I think is multifactorial. He's had problems with pulmonary emboli had chronic combined systolic and diastolic heart failure and has COPD with exacerbation. He  is improving. Active Problems:   HTN (hypertension)   Type 2 diabetes mellitus   Anemia   Dyspnea   Chronic combined systolic and diastolic CHF (congestive heart failure)   COPD (chronic obstructive pulmonary disease)   Hypertension   CKD (chronic kidney disease), stage III   Hypoxia   DOE (dyspnea on exertion)   Acute pulmonary embolism   Acute respiratory failure with hypoxia    Plan: From a pulmonary point of view I think he is okay for discharge. He would need to be on oxygen and have a  short taper of steroids and perhaps a Z-Pak.    LOS: 9 days   , L 03/19/2014, 8:41 AM

## 2014-03-19 NOTE — Clinical Social Work Psychosocial (Signed)
Clinical Social Work Department BRIEF PSYCHOSOCIAL ASSESSMENT 03/19/2014  Patient:  Bryce Mcdonald, Bryce Mcdonald     Account Number:  [id number]     Admit date:  03/10/2014  Clinical Social Worker:  Bryce Mcdonald  Date/Time:  03/19/2014 03:59 PM  Referred by:  CSW  Date Referred:  03/19/2014 Referred for  SNF Placement   Other Referral:   Interview type:  Patient Other interview type:   Bryce Mcdonald- daughter    PSYCHOSOCIAL DATA Living Status:  ALONE Admitted from facility:   Level of care:   Primary support name:  Bryce Mcdonald Primary support relationship to patient:  CHILD, ADULT Degree of support available:   supportive    CURRENT CONCERNS Current Concerns  Post-Acute Placement   Other Concerns:    SOCIAL WORK ASSESSMENT / PLAN CSW met with pt at bedside following referral from NP for possible placement. Pt's son also present with permission by pt. Pt states that he lives alone, but his daughter helps him out a lot. He also has a caregiver for two hours a day, 5 days a week. Pt began sharing with his son that he was suffering and has been for some time. He states, "I'm ready to go." CSW shared conversation with MD who plans to discuss further. CSW asked pt about placement. He states that was not what he meant and he didn't explain to NP that he wanted to go home. Pt states to call his daughter as she is arranging everything. CSW spoke with Bryce Mcdonald. She indicates she is working on adding 7 hours a day of assistance at home through the [place]. Pt's oxygen has already been delivered at home. Bryce Mcdonald reports she spends the weekend with pt and lives nearby. She also desires for pt to return home and states, "I have a good understanding of what he needs."   Assessment/plan status:  Psychosocial Support/Ongoing Assessment of Needs Other assessment/ plan:   Information/referral to community resources:   CM for home health    PATIENT'S/FAMILY'S RESPONSE TO PLAN OF CARE: Pt and pt's daughter plan for pt to return home  with home health. CSW notified MD and CM and will sign off, but can be reconsulted if needed.       Bryce Mcdonald, [place]

## 2014-03-19 NOTE — Progress Notes (Signed)
PHARMACIST - PHYSICIAN COMMUNICATION DR:   Roderic Palau CONCERNING: Antibiotic IV to Oral Route Change Policy  RECOMMENDATION: This patient is receiving Zithromax by the intravenous route.  Based on criteria approved by the Pharmacy and Therapeutics Committee, the antibiotic(s) is/are being converted to the equivalent oral dose form(s).   DESCRIPTION: These criteria include:  Patient being treated for a respiratory tract infection, urinary tract infection, cellulitis or clostridium difficile associated diarrhea if on metronidazole  The patient is not neutropenic and does not exhibit a GI malabsorption state  The patient is eating (either orally or via tube) and/or has been taking other orally administered medications for a least 24 hours  The patient is improving clinically and has a Tmax < 100.5  If you have questions about this conversion, please contact the Pharmacy Department  [x]   [phone] )  [facility] []   [phone] )  [facility]  []   [phone] )  [facility] []   [phone] )  [place], PharmD, BCPS 03/19/2014@10 :36 AM

## 2014-03-19 NOTE — Progress Notes (Signed)
[facility] PROGRESS NOTE  Bryce Mcdonald VQM:[id number] DOB: 23-May-1921 DOA: 03/10/2014 PCP: PROVIDER NOT IN SYSTEM  Assessment/Plan: Dyspnea on exertion secondary to likely acutely decompensated diastolic CHF in setting of COPD Wt today 75.1kg from 85.7kg. Urine output adequate. Continue lasix po. Patient with frequent hospitalizations this past year. May benefit from palliative/hospice consult.   Suspected COPD exacerbation: pt reports worsening sob. BS distant and coarse. sats 90-995 on 3L. Will transition solumedrol to prednisone. Continue nebs and  antibiotics. Appreciate pulmonology assistance. follow   Hypoxia See #1. Echo on 03/11/14 with severe LVH and EF 55%. Repeat ABG with pO2 62.6. PCO2 43.7. See above  ? Left atrial thrombus CT angiogram raise the possibility of left atrial thrombus. Echo did not show obvious large thrombus. Evaluated by cardiology who recommend no further imaging and continuation of anticoagulation. They do recommend changing to Xarelto.   Recent history of pulmonary embolism In the previous admission on 02/13/2014, CT angiogram showed nonocclusive right lower lobe segmental pulmonary embolism. Transitioned to [place] as noted above. Discontinued aspirin.  Constipation Continue dulcolax suppository when necessary  Elevated troponin Stable. Likely due to demand ischemia from A. fib. Rate remains controlled. No chest pain. EKG with no acute changes.  Diabetes II: diet controlled. CBG range 118-178. Weaning steroids and continue SSI for optimal control.   Anemia: likely related to chronic disease. Stable.  Code Status: DNR Family Communication: none present Disposition Plan: to be determined. Patient verbalizing desire for placement. Will obtain SW consult   Consultants:  Cardiology  Oncology  pulmonology  Procedures:  none  Antibiotics:  Azithromycin 03/17/14>>  HPI/Subjective: Lying in bed reporting feeling "miserable".  States no sustained improvement in breathing and he is ready to go to "eternal home. It wont be long".   Objective: Filed Vitals:   03/19/14 1423  BP: 151/73  Pulse: 77  Temp: 98.7 F (37.1 C)  Resp: 18    Intake/Output Summary (Last 24 hours) at 03/19/14 1435 Last data filed at 03/19/14 1324  Gross per 24 hour  Intake    600 ml  Output    950 ml  Net   -350 ml   Filed Weights   03/17/14 0455 03/18/14 0603 03/19/14 0619  Weight: 77.338 kg (170 lb 8 oz) 76.204 kg (168 lb) 75.1 kg (165 lb 9.1 oz)    Exam:   General:  Calm, no acute distress  Cardiovascular: RRR No MGR no LE edema PPP  Respiratory: normal effort BS diminished and coarse. No wheeze.   Abdomen: soft +BS non-tender non-distended  Musculoskeletal: joints without swelling/erythema   Data Reviewed: Basic Metabolic Panel:  Recent Labs Lab 03/15/14 0512 03/16/14 0623 03/17/14 0229 03/18/14 0633 03/19/14 0607 03/19/14 1156  NA 142 141 137 137 138  --   K 4.2 4.5 4.4 4.7 5.0  --   CL 103 98 96 94* 96  --   CO2 31 36* 34* 32 33*  --   GLUCOSE 112* 103* 118* 159* 151* 350*  BUN 35* 33* 33* 37* 41*  --   CREATININE 1.10 1.14 1.16 1.13 1.18  --   CALCIUM 9.8 9.7 9.5 9.9 10.1  --    Liver Function Tests: No results for input(s): AST, ALT, ALKPHOS, BILITOT, PROT, ALBUMIN in the last 168 hours. No results for input(s): LIPASE, AMYLASE in the last 168 hours. No results for input(s): AMMONIA in the last 168 hours. CBC:  Recent Labs Lab 03/13/14 0530 03/13/14 1133 03/15/14 9326 03/16/14 7124 03/18/14 5809  WBC 26.2* 9.7 6.5 7.5 6.9  HGB 11.6* 11.6* 10.4* 11.5* 13.4  HCT 36.8* 36.1* 31.8* 35.4* 40.8  MCV 95.1 105.9* 104.6* 103.8* 100.7*  PLT 302 174 154 168 194   Cardiac Enzymes:  Recent Labs Lab 03/16/14 1423 03/16/14 2006 03/16/14 2230 03/17/14 0229 03/17/14 0706  TROPONINI 0.21* 0.20* 0.20* 0.22* 0.21*   BNP (last 3 results)  Recent Labs  07/26/13 1128 11/16/13 0839  01/10/14 1046  PROBNP 2036.0* 770.7* 1366.0*   CBG:  Recent Labs Lab 03/18/14 1645 03/18/14 2027 03/19/14 0716 03/19/14 1101 03/19/14 1347  GLUCAP 113* 137* 150* 467* 201*    Recent Results (from the past 240 hour(s))  Clostridium Difficile by PCR     Status: None   Collection Time: 03/16/14 10:34 AM  Result Value Ref Range Status   C difficile by pcr NEGATIVE NEGATIVE Final     Studies: US Venous Img Lower Bilateral  03/18/2014   CLINICAL DATA:  Bilateral lower extremity DVT. Cramping. Fell 1 week ago. History of tobacco abuse.  EXAM: BILATERAL LOWER EXTREMITY VENOUS DOPPLER ULTRASOUND  TECHNIQUE: Gray-scale sonography with compression, as well as color and duplex ultrasound, were performed to evaluate the deep venous system from the level of the common femoral vein through the popliteal and proximal calf veins.  COMPARISON:  10/10/2012  FINDINGS: ON the right, incompletely compressible thrombus at the saphenous femoral junction into the common femoral vein, and in the profunda femoral vein near its confluence with the common femoral. There is flow signal across this area these areas on color Doppler interrogation. Femoral and popliteal veins unremarkable. Visualized segments of the saphenous venous system normal in caliber and compressibility.  On the left, there is chronic mural thrombus across the saphenous femoral junction into the common femoral vein, in the deep femoral vein near its confluence with common femoral, and through the length of femoral vein l. There is flow through these segments on color Doppler interrogation. Popliteal vein in visualized calf veins unremarkable. Visualized segments of the saphenous venous system normal in caliber and compressibility.  IMPRESSION: 1. Chronic nonocclusive mural thrombus in RIGHT profunda femoral and common femoral veins; and LEFT femoral, deep femoral, and common femoral veins. The distribution is similar to that seen on prior study. 2.  No evidence of acute/superimposed DVT.   Electronically Signed   By: Arne Cleveland M.D.   On: 03/18/2014 09:45    Scheduled Meds: . albuterol  2.5 mg Nebulization TID  . aspirin  81 mg Oral Daily  . atorvastatin  80 mg Oral q1800  . [START ON 03/20/2014] azithromycin  500 mg Oral Daily  . famotidine  20 mg Oral BID  . furosemide  20 mg Oral Daily  . insulin aspart  0-20 Units Subcutaneous TID WC  . insulin aspart  4 Units Subcutaneous TID WC  . levothyroxine  50 mcg Oral QAC breakfast  . loratadine  10 mg Oral Daily  . methylPREDNISolone (SOLU-MEDROL) injection  60 mg Intravenous Q6H  . rivaroxaban  20 mg Oral Q breakfast  . sodium chloride  3 mL Intravenous Q12H  . sodium chloride  3 mL Intravenous Q12H   Continuous Infusions:   Active Problems:   HTN (hypertension)   Type 2 diabetes mellitus   Anemia   Dyspnea   Chronic combined systolic and diastolic CHF (congestive heart failure)   COPD (chronic obstructive pulmonary disease)   Hypertension   CKD (chronic kidney disease), stage III   Hypoxia   DOE (dyspnea  on exertion)   Acute pulmonary embolism   Acute respiratory failure with hypoxia    Time spent: 40 minutes    [facility] Pager [phone]. If 7PM-7AM, please contact night-coverage at [email or web address], password [facility] 03/19/2014, 2:35 PM  LOS: 9 days

## 2014-03-19 NOTE — Progress Notes (Signed)
Pt up to ambulate O2 sat dropped to 82% prior to leaving the room Pt was helped back to bed and O2 via [place] applied at 3L

## 2014-03-20 DIAGNOSIS — J9621 Acute and chronic respiratory failure with hypoxia: Secondary | ICD-10-CM

## 2014-03-20 DIAGNOSIS — I5043 Acute on chronic combined systolic (congestive) and diastolic (congestive) heart failure: Principal | ICD-10-CM

## 2014-03-20 LAB — GLUCOSE, CAPILLARY
GLUCOSE-CAPILLARY: 119 mg/dL — AB (ref 70–99)
Glucose-Capillary: 168 mg/dL — ABNORMAL HIGH (ref 70–99)
Glucose-Capillary: 169 mg/dL — ABNORMAL HIGH (ref 70–99)
Glucose-Capillary: 267 mg/dL — ABNORMAL HIGH (ref 70–99)

## 2014-03-20 MED ORDER — RIVAROXABAN 20 MG PO TABS: 20.0000 mg | ORAL_TABLET | Freq: Every day | ORAL | Status: AC

## 2014-03-20 MED ORDER — FUROSEMIDE 20 MG PO TABS: 20.0000 mg | ORAL_TABLET | Freq: Every day | ORAL | Status: AC

## 2014-03-20 MED ORDER — AZITHROMYCIN 500 MG PO TABS
500.0000 mg | ORAL_TABLET | Freq: Every day | ORAL | Status: DC
Start: 2014-03-20 — End: 2014-03-25

## 2014-03-20 MED ORDER — PREDNISONE 10 MG PO TABS: ORAL_TABLET | ORAL | Status: DC

## 2014-03-20 MED ORDER — TIOTROPIUM BROMIDE MONOHYDRATE 18 MCG IN CAPS: 18.0000 ug | ORAL_CAPSULE | Freq: Every day | RESPIRATORY_TRACT | Status: AC

## 2014-03-20 MED ORDER — ALBUTEROL SULFATE (2.5 MG/3ML) 0.083% IN NEBU
2.5000 mg | INHALATION_SOLUTION | RESPIRATORY_TRACT | Status: DC | PRN
Start: 2014-03-20 — End: 2014-03-25

## 2014-03-20 NOTE — Progress Notes (Signed)
SATURATION QUALIFICATIONS: (This note is used to comply with regulatory documentation for home oxygen)  Patient Saturations on Room Air at Rest = 96% Patient Saturations on Room Air while Ambulating = 82%  Patient Saturations on 3 Liters of oxygen while Ambulating = 92%  Please briefly explain why patient needs home oxygen: Patient at rest sitting in bed 96%.  Patient Standing and using urinal became dyspneic and Oxygen at 89%, HR 110. Patient ambulating 15 feet, HR 123, O2 saturation on room air 82%.  Patient needed to sit right away because he wasn't tolerating increased activity of ambulating 15 feet.

## 2014-03-20 NOTE — Discharge Summary (Signed)
Physician Discharge Summary  Bryce Mcdonald WUJ:[id number] DOB: 24-May-1921 DOA: 03/10/2014  PCP: PROVIDER NOT IN SYSTEM  Admit date: 03/10/2014 Discharge date: 03/20/2014  Time spent: 40 minutes  Recommendations for Outpatient Follow-up:  1. Follow up with [facility] PCP 2-3 for evaluation of respiratory status and functional status 2. Follow up with Dr Luan Pulling pulmonology 1 week for evaluation of COPD exacerbation 3. [facility] PT  Discharge Diagnoses:  Active Problems:   HTN (hypertension)   Type 2 diabetes mellitus, uncontrolled   Anemia   Acute on Chronic combined systolic and diastolic CHF (congestive heart failure)   COPD exacerbation   Hypertension   CKD (chronic kidney disease), stage III   History of pulmonary embolism   Acute on chronic respiratory failure   Discharge Condition: fair  Diet recommendation: carb modified  Filed Weights   03/18/14 0603 03/19/14 0619 03/20/14 0549  Weight: 76.204 kg (168 lb) 75.1 kg (165 lb 9.1 oz) 76.839 kg (169 lb 6.4 oz)    History of present illness:  81 year old male who  has a past medical history of Colon cancer; Hypertension; Hypercholesterolemia; Blood transfusion; DVT (deep venous thrombosis); PE (pulmonary embolism); Diabetes mellitus; Chronic atrial fibrillation; Diabetes mellitus, type II; Ventral hernia; Unspecified hypothyroidism; LVH (left ventricular hypertrophy) CKD (chronic kidney disease), stage III presented to ED on 03/11/14 with cc sob.  Patient was recently discharged from hospital on 02/17/2014 after he was diagnosed with pulmonary embolism, patient was discharged on Lovenox injection twice a day. Patient came to the ED with shortness of breath and was discharged home with prednisone and nebulizer treatment for COPD exacerbation. As per patient he had been getting worsening of shortness of breath on exertion, and complained of lower abdominal pain. Patient was found on the floor at the group home, though he denied passing  out. Patient reorted that he hadn't moved his bowels for  3 days. In the ED CT angiogram chest was done  which surprisingly did not show any acute or chronic pulmonary embolism, but showed no enhancement of left atrial appendage which could be delayed mixing or thrombus. Patient also has mild elevation of troponin He denied chest pain, no nausea or vomiting  Hospital Course:  Dyspnea on exertion secondary to likely acutely decompensated diastolic CHF in setting of COPD Diuresed well with lasix. Will be discharged with daily dose of lasix.  Wt at discharge 76.8kg from 85.7kg. Urine output adequate.  Echo with severe LVH and EF 55%. Will follow up with PCP at [facility] and Dr Luan Pulling with pulmonology for evaluation of respiratory status. Will be discharged on continuous oxygen. Evaluated by Pulmonology who opined dyspnea/hypoxia likely multifactorial ie. Hx PE, chronic diastolic HF and COPD exacerbation. Follow up with Dr Luan Pulling 1 week.  Suspected COPD exacerbation:  Provided with nebs, steroids and antibiotics. Follow up with Dr Luan Pulling 1 week. Continue oxygen and nebs at home   Hypoxia See #1. Echo on 03/11/14 with severe LVH and EF 55%. Repeat ABG with pO2 62.6. PCO2 43.7. See above  ? Left atrial thrombus CT angiogram raise the possibility of left atrial thrombus. Echo did not show obvious large thrombus. Evaluated by cardiology who recommend no further imaging and continuation of anticoagulation. They do recommend changing to Xarelto and agreed to following/assisting with xarelto.   Recent history of pulmonary embolism In the previous admission on 02/13/2014, CT angiogram showed nonocclusive right lower lobe segmental pulmonary embolism. Transitioned to [place] as noted above. Discontinued aspirin.  Constipation stable  Elevated troponin Stable. Likely  due to demand ischemia from A. fib. Rate remains controlled. No chest pain. EKG with no acute changes.  Diabetes II: diet controlled. CBG  range 118-178. Weaning steroids and continue SSI for optimal control.   Anemia: likely related to chronic disease. Stable.   Procedures:  Echo on 03/11/14 The cavity size was normal. Wall thickness was increased in a pattern of severe LVH. Systolic function was normal. The estimated ejection fraction was 55%. Wall motion was normal; there were no regional wall motion abnormalities.  Consultations:  Cardiology  Oncology  pulmonology   Discharge Exam: Filed Vitals:   03/20/14 1354  BP: 125/71  Pulse: 62  Temp: 97.6 F (36.4 C)  Resp: 18    General: well nourished, somewhat chronically ill appearing Cardiovascular: RRR No MGR No LE edema Respiratory: mild increased work of breathing with activity, BS coarse throughout. Improved air movement  Discharge Instructions   Discharge Instructions    DME Nebulizer machine    Complete by:  As directed           Current Discharge Medication List    START taking these medications   Details  albuterol (PROVENTIL) (2.5 MG/3ML) 0.083% nebulizer solution Take 3 mLs (2.5 mg total) by nebulization every 4 (four) hours as needed for wheezing or shortness of breath. Qty: 75 mL, Refills: 12    azithromycin (ZITHROMAX) 500 MG tablet Take 1 tablet (500 mg total) by mouth daily. Qty: 4 tablet, Refills: 0    rivaroxaban (XARELTO) 20 MG TABS tablet Take 1 tablet (20 mg total) by mouth daily with breakfast. Qty: 30 tablet, Refills: 1    tiotropium (SPIRIVA HANDIHALER) 18 MCG inhalation capsule Place 1 capsule (18 mcg total) into inhaler and inhale daily. Qty: 30 capsule, Refills: 12      CONTINUE these medications which have CHANGED   Details  furosemide (LASIX) 20 MG tablet Take 1 tablet (20 mg total) by mouth daily. Qty: 90 tablet, Refills: 3   Associated Diagnoses: Diarrhea    predniSONE (DELTASONE) 10 MG tablet Take 40mg  po daily for 3 days then 30mg  po daily for 3 days then 20mg  po daily for 3 days then 10mg  po daily  for 3 days then stop Qty: 30 tablet, Refills: 0      CONTINUE these medications which have NOT CHANGED   Details  albuterol (PROVENTIL HFA) 108 (90 BASE) MCG/ACT inhaler Inhale 2 puffs into the lungs every 6 (six) hours as needed for wheezing or shortness of breath.    aspirin 81 MG chewable tablet Chew 81 mg by mouth daily.    atorvastatin (LIPITOR) 80 MG tablet Take 80 mg by mouth daily.    famotidine (PEPCID) 20 MG tablet Take 1 tablet (20 mg total) by mouth 2 (two) times daily.    levothyroxine (SYNTHROID, LEVOTHROID) 50 MCG tablet Take 50 mcg by mouth daily before breakfast.    vitamin B-12 (CYANOCOBALAMIN) 1000 MCG tablet Take 1,000 mcg by mouth daily.    acetaminophen (TYLENOL) 325 MG tablet Take 650 mg by mouth every 6 (six) hours as needed for mild pain or fever.      STOP taking these medications     enoxaparin (LOVENOX) 80 MG/0.8ML injection        Allergies  Allergen Reactions  . Codeine Shortness Of Breath    Shortness of breath  . Penicillins Shortness Of Breath  . Morphine And Related Itching   Follow-up Information    Follow up with [facility].  Contact information:   [address] [place] [address] [phone]       Follow up with HAWKINS,EDWARD L, MD. Schedule an appointment as soon as possible for a visit in 1 week.   Specialty:  Pulmonary Disease   Why:  for follow up on respiratory status   Contact information:   [place] [address] [phone]        The results of significant diagnostics from this hospitalization (including imaging, microbiology, ancillary and laboratory) are listed below for reference.    Significant Diagnostic Studies: Dg Chest 2 View  03/16/2014   CLINICAL DATA:  Dyspnea on exertion  EXAM: CHEST  2 VIEW  COMPARISON:  March 13, 2014 chest radiograph and chest CT March 10, 2014  FINDINGS: There is underlying emphysematous change. There is  scarring in both lower lung zones as well as in the posterior segment right upper lobe. A 6 mm nodular opacity in the right middle lobe is unchanged from recent CT. There is no frank edema or consolidation. Heart size is within normal limits. Pulmonary vascularity reflects underlying emphysema. No adenopathy. There is atherosclerotic change in aorta. No bone lesions.  IMPRESSION: Underlying emphysematous change with areas of scarring. Small nodular opacity right middle lobe, stable. No edema or consolidation. No new lung opacity. No change in cardiac silhouette.   Electronically Signed   By: Lowella Grip M.D.   On: 03/16/2014 10:22   Dg Chest 2 View  03/09/2014   CLINICAL DATA:  Three-day history of difficulty breathing. History of emphysema  EXAM: CHEST  2 VIEW  COMPARISON:  Chest radiograph February 13, 2014; chest CT February 13, 2014  FINDINGS: There is underlying emphysema. There is scarring in the right lower lobe which appears stable. There is no frank edema or consolidation. The heart is mildly enlarged with pulmonary vascularity within normal limits. Is atherosclerotic change in aorta. No adenopathy. No bone lesions.  IMPRESSION: Underlying emphysema. Scarring right lower lobe, stable. No frank edema or consolidation. Heart prominent but stable.   Electronically Signed   By: Lowella Grip M.D.   On: 03/09/2014 11:47   Ct Angio Chest Pe W/cm &/or Wo Cm  03/11/2014   CLINICAL DATA:  Shortness of breath.  Recent pulmonary embolism.  EXAM: CT ANGIOGRAPHY CHEST WITH CONTRAST  TECHNIQUE: Multidetector CT imaging of the chest was performed using the standard protocol during bolus administration of intravenous contrast. Multiplanar CT image reconstructions and MIPs were obtained to evaluate the vascular anatomy.  CONTRAST:  168mL OMNIPAQUE IOHEXOL 350 MG/ML SOLN  COMPARISON:  02/13/2014  FINDINGS: THORACIC INLET/BODY WALL:  No acute abnormality.  MEDIASTINUM:  Normal heart size. No pericardial  effusion. There is atherosclerosis, including the coronary arteries. A small persistent left SVC is incidentally noted. There is non opacification of the left atrial appendage. Although there is early opacification of the left heart, this was also seen previously. Noted history of atrial fibrillation. No acute or chronic pulmonary embolism. No evidence of acute aortic pathology, although there is no enhancement within the systemic arterial tree.  LUNG WINDOWS:  Centrilobular emphysema which is mild. Bibasilar scarring which is stable. No edema or pneumonia. Trace bilateral pleural effusion.  There are multiple small scattered pulmonary nodules that are peripheral. These are stable from 2014 at least, including the largest within the right middle lobe at 5 mm (image 50).  UPPER ABDOMEN:  No acute findings.  OSSEOUS:  No acute fracture.  No suspicious lytic  or blastic lesions.  Review of the MIP images confirms the above findings.  IMPRESSION: 1. No acute or chronic pulmonary embolism. 2. Non enhancement of the left atrial appendage which could be delayed mixing or thrombus in this patient with history of atrial fibrillation. 3. Trace bilateral pleural effusion. 4. Emphysema.   Electronically Signed   By: Jorje Guild M.D.   On: 03/11/2014 00:07   US Venous Img Lower Bilateral  03/18/2014   CLINICAL DATA:  Bilateral lower extremity DVT. Cramping. Fell 1 week ago. History of tobacco abuse.  EXAM: BILATERAL LOWER EXTREMITY VENOUS DOPPLER ULTRASOUND  TECHNIQUE: Gray-scale sonography with compression, as well as color and duplex ultrasound, were performed to evaluate the deep venous system from the level of the common femoral vein through the popliteal and proximal calf veins.  COMPARISON:  10/10/2012  FINDINGS: ON the right, incompletely compressible thrombus at the saphenous femoral junction into the common femoral vein, and in the profunda femoral vein near its confluence with the common femoral. There is flow  signal across this area these areas on color Doppler interrogation. Femoral and popliteal veins unremarkable. Visualized segments of the saphenous venous system normal in caliber and compressibility.  On the left, there is chronic mural thrombus across the saphenous femoral junction into the common femoral vein, in the deep femoral vein near its confluence with common femoral, and through the length of femoral vein l. There is flow through these segments on color Doppler interrogation. Popliteal vein in visualized calf veins unremarkable. Visualized segments of the saphenous venous system normal in caliber and compressibility.  IMPRESSION: 1. Chronic nonocclusive mural thrombus in RIGHT profunda femoral and common femoral veins; and LEFT femoral, deep femoral, and common femoral veins. The distribution is similar to that seen on prior study. 2. No evidence of acute/superimposed DVT.   Electronically Signed   By: Arne Cleveland M.D.   On: 03/18/2014 09:45   Dg Chest Port 1 View  03/13/2014   CLINICAL DATA:  Hypoxia.  EXAM: PORTABLE CHEST - 1 VIEW  COMPARISON:  None.  FINDINGS: Cardiomegaly and pulmonary vascular prominence and diffuse interstitial prominence consistent with congestive heart failure. Bilateral pleural effusions. Subsegmental atelectasis right upper lobe. No acute bony abnormality .  IMPRESSION: 1. Congestive heart failure with pulmonary edema and bilateral pleural effusions.  2.  Subsegment atelectasis right upper lobe.   Electronically Signed   By: Marcello Moores  Register   On: 03/13/2014 09:08    Microbiology: Recent Results (from the past 240 hour(s))  Clostridium Difficile by PCR     Status: None   Collection Time: 03/16/14 10:34 AM  Result Value Ref Range Status   C difficile by pcr NEGATIVE NEGATIVE Final     Labs: Basic Metabolic Panel:  Recent Labs Lab 03/15/14 0512 03/16/14 0623 03/17/14 0229 03/18/14 0633 03/19/14 0607 03/19/14 1156  NA 142 141 137 137 138  --   K 4.2 4.5  4.4 4.7 5.0  --   CL 103 98 96 94* 96  --   CO2 31 36* 34* 32 33*  --   GLUCOSE 112* 103* 118* 159* 151* 350*  BUN 35* 33* 33* 37* 41*  --   CREATININE 1.10 1.14 1.16 1.13 1.18  --   CALCIUM 9.8 9.7 9.5 9.9 10.1  --    Liver Function Tests: No results for input(s): AST, ALT, ALKPHOS, BILITOT, PROT, ALBUMIN in the last 168 hours. No results for input(s): LIPASE, AMYLASE in the last 168 hours. No results for  input(s): AMMONIA in the last 168 hours. CBC:  Recent Labs Lab 03/15/14 0512 03/16/14 0623 03/18/14 0633  WBC 6.5 7.5 6.9  HGB 10.4* 11.5* 13.4  HCT 31.8* 35.4* 40.8  MCV 104.6* 103.8* 100.7*  PLT 154 168 194   Cardiac Enzymes:  Recent Labs Lab 03/16/14 1423 03/16/14 2006 03/16/14 2230 03/17/14 0229 03/17/14 0706  TROPONINI 0.21* 0.20* 0.20* 0.22* 0.21*   BNP: BNP (last 3 results)  Recent Labs  07/26/13 1128 11/16/13 0839 01/10/14 1046  PROBNP 2036.0* 770.7* 1366.0*   CBG:  Recent Labs Lab 03/19/14 1101 03/19/14 1347 03/19/14 1707 03/20/14 0713 03/20/14 1138  GLUCAP 467* 201* 131* 119* 169*       Signed:  BLACK,KAREN M  [facility] 03/20/2014, 3:10 PM  Attending note:  Patient was seen and examined. Above note reviewed.  This patient was admitted to the hospital with progressive shortness of breath. Initially was felt that he had an acute on chronic congestive heart failure exacerbation. He was diuresed with intravenous Lasix and initially improved. His hypoxia and shortness of breath persisted. He was followed by both cardiology and pulmonology during his hospital stay. Once he was diuresed, it was felt that his shortness of breath was related to COPD exacerbation. Patient was started on intravenous steroids, antibiotics. He gradually improved back to his baseline. The patient has poor functional status and is currently on 3 L of oxygen. He has been placed on a prednisone taper.  With the nature of his comorbidities including  congestive heart failure, COPD and chronic respiratory failure, patient has had significant impact on his quality of life. He has had 4 admissions to the hospital in the past 6 months. Several times during his hospital stay, patient expressed that he did not wish to continue with aggressive treatment and was "ready to die". After having a discussion with the patient and several family members, I broached the subject of pursuing hospice services in the outpatient setting. I feel that it would improve his quality of life. The patient and family wish  to discuss this further at home. I would highly recommend that he discuss this with his primary care physician at the [facility]. Patient is otherwise stable for discharge today  Enma Maeda

## 2014-03-22 ENCOUNTER — Emergency Department (HOSPITAL_COMMUNITY): Payer: Non-veteran care

## 2014-03-22 ENCOUNTER — Encounter (HOSPITAL_COMMUNITY): Payer: Self-pay | Admitting: *Deleted

## 2014-03-22 ENCOUNTER — Inpatient Hospital Stay (HOSPITAL_COMMUNITY)
Admission: EM | Admit: 2014-03-22 | Discharge: 2014-03-25 | DRG: 194 | Disposition: A | Discharge status: Home / Self Care | Payer: Non-veteran care | Attending: Internal Medicine | Admitting: Internal Medicine

## 2014-03-22 DIAGNOSIS — J449 Chronic obstructive pulmonary disease, unspecified: Secondary | ICD-10-CM | POA: Diagnosis present

## 2014-03-22 DIAGNOSIS — Z7901 Long term (current) use of anticoagulants: Secondary | ICD-10-CM

## 2014-03-22 DIAGNOSIS — I272 Other secondary pulmonary hypertension: Secondary | ICD-10-CM | POA: Diagnosis present

## 2014-03-22 DIAGNOSIS — B964 Proteus (mirabilis) (morganii) as the cause of diseases classified elsewhere: Secondary | ICD-10-CM | POA: Diagnosis present

## 2014-03-22 DIAGNOSIS — J189 Pneumonia, unspecified organism: Principal | ICD-10-CM | POA: Diagnosis present

## 2014-03-22 DIAGNOSIS — F0390 Unspecified dementia without behavioral disturbance: Secondary | ICD-10-CM | POA: Diagnosis present

## 2014-03-22 DIAGNOSIS — I129 Hypertensive chronic kidney disease with stage 1 through stage 4 chronic kidney disease, or unspecified chronic kidney disease: Secondary | ICD-10-CM | POA: Diagnosis present

## 2014-03-22 DIAGNOSIS — I5032 Chronic diastolic (congestive) heart failure: Secondary | ICD-10-CM | POA: Diagnosis present

## 2014-03-22 DIAGNOSIS — R531 Weakness: Secondary | ICD-10-CM

## 2014-03-22 DIAGNOSIS — R319 Hematuria, unspecified: Secondary | ICD-10-CM | POA: Diagnosis present

## 2014-03-22 DIAGNOSIS — F17220 Nicotine dependence, chewing tobacco, uncomplicated: Secondary | ICD-10-CM | POA: Diagnosis present

## 2014-03-22 DIAGNOSIS — I10 Essential (primary) hypertension: Secondary | ICD-10-CM | POA: Diagnosis present

## 2014-03-22 DIAGNOSIS — I482 Chronic atrial fibrillation, unspecified: Secondary | ICD-10-CM | POA: Diagnosis present

## 2014-03-22 DIAGNOSIS — E039 Hypothyroidism, unspecified: Secondary | ICD-10-CM | POA: Diagnosis present

## 2014-03-22 DIAGNOSIS — E780 Pure hypercholesterolemia: Secondary | ICD-10-CM | POA: Diagnosis present

## 2014-03-22 DIAGNOSIS — E119 Type 2 diabetes mellitus without complications: Secondary | ICD-10-CM

## 2014-03-22 DIAGNOSIS — Z9049 Acquired absence of other specified parts of digestive tract: Secondary | ICD-10-CM | POA: Diagnosis present

## 2014-03-22 DIAGNOSIS — I5042 Chronic combined systolic (congestive) and diastolic (congestive) heart failure: Secondary | ICD-10-CM | POA: Diagnosis present

## 2014-03-22 DIAGNOSIS — Z880 Allergy status to penicillin: Secondary | ICD-10-CM

## 2014-03-22 DIAGNOSIS — Z86711 Personal history of pulmonary embolism: Secondary | ICD-10-CM

## 2014-03-22 DIAGNOSIS — N183 Chronic kidney disease, stage 3 (moderate): Secondary | ICD-10-CM | POA: Diagnosis present

## 2014-03-22 DIAGNOSIS — Z7982 Long term (current) use of aspirin: Secondary | ICD-10-CM

## 2014-03-22 DIAGNOSIS — Z885 Allergy status to narcotic agent status: Secondary | ICD-10-CM

## 2014-03-22 DIAGNOSIS — K439 Ventral hernia without obstruction or gangrene: Secondary | ICD-10-CM | POA: Diagnosis present

## 2014-03-22 DIAGNOSIS — Z86718 Personal history of other venous thrombosis and embolism: Secondary | ICD-10-CM

## 2014-03-22 DIAGNOSIS — Z66 Do not resuscitate: Secondary | ICD-10-CM | POA: Diagnosis present

## 2014-03-22 DIAGNOSIS — N390 Urinary tract infection, site not specified: Secondary | ICD-10-CM | POA: Diagnosis present

## 2014-03-22 DIAGNOSIS — I2510 Atherosclerotic heart disease of native coronary artery without angina pectoris: Secondary | ICD-10-CM | POA: Diagnosis present

## 2014-03-22 DIAGNOSIS — Z85038 Personal history of other malignant neoplasm of large intestine: Secondary | ICD-10-CM

## 2014-03-22 DIAGNOSIS — E871 Hypo-osmolality and hyponatremia: Secondary | ICD-10-CM | POA: Diagnosis present

## 2014-03-22 DIAGNOSIS — N200 Calculus of kidney: Secondary | ICD-10-CM | POA: Diagnosis present

## 2014-03-22 DIAGNOSIS — Y95 Nosocomial condition: Secondary | ICD-10-CM | POA: Diagnosis present

## 2014-03-22 LAB — LACTIC ACID, PLASMA: LACTIC ACID, VENOUS: 1.3 mmol/L (ref 0.5–2.0)

## 2014-03-22 LAB — URINALYSIS, ROUTINE W REFLEX MICROSCOPIC
Bilirubin Urine: NEGATIVE
Glucose, UA: NEGATIVE mg/dL
Ketones, ur: NEGATIVE mg/dL
NITRITE: NEGATIVE
PH: 6 (ref 5.0–8.0)
PROTEIN: 30 mg/dL — AB
Specific Gravity, Urine: <1.005 — ABNORMAL LOW (ref 1.005–1.030)
UROBILINOGEN UA: 0.2 mg/dL (ref 0.0–1.0)

## 2014-03-22 LAB — CBC WITH DIFFERENTIAL/PLATELET
Basophils Absolute: 0 10*3/uL (ref 0.0–0.1)
Basophils Relative: 0 % (ref 0–1)
Eosinophils Absolute: 0 10*3/uL (ref 0.0–0.7)
Eosinophils Relative: 0 % (ref 0–5)
HCT: 38.8 % — ABNORMAL LOW (ref 39.0–52.0)
HEMOGLOBIN: 12.9 g/dL — AB (ref 13.0–17.0)
LYMPHS PCT: 26 % (ref 12–46)
Lymphs Abs: 2.8 10*3/uL (ref 0.7–4.0)
MCH: 32.9 pg (ref 26.0–34.0)
MCHC: 33.2 g/dL (ref 30.0–36.0)
MCV: 99 fL (ref 78.0–100.0)
Monocytes Absolute: 1.3 10*3/uL — ABNORMAL HIGH (ref 0.1–1.0)
Monocytes Relative: 12 % (ref 3–12)
Neutro Abs: 6.5 10*3/uL (ref 1.7–7.7)
Neutrophils Relative %: 62 % (ref 43–77)
Platelets: 179 10*3/uL (ref 150–400)
RBC: 3.92 MIL/uL — ABNORMAL LOW (ref 4.22–5.81)
RDW: 14.3 % (ref 11.5–15.5)
WBC: 10.6 10*3/uL — ABNORMAL HIGH (ref 4.0–10.5)

## 2014-03-22 LAB — COMPREHENSIVE METABOLIC PANEL
ALT: 25 U/L (ref 0–53)
ANION GAP: 6 (ref 5–15)
AST: 26 U/L (ref 0–37)
Albumin: 3.5 g/dL (ref 3.5–5.2)
Alkaline Phosphatase: 81 U/L (ref 39–117)
BUN: 33 mg/dL — ABNORMAL HIGH (ref 6–23)
CHLORIDE: 95 mmol/L — AB (ref 96–112)
CO2: 28 mmol/L (ref 19–32)
CREATININE: 1.21 mg/dL (ref 0.50–1.35)
Calcium: 8.8 mg/dL (ref 8.4–10.5)
GFR, EST AFRICAN AMERICAN: 59 mL/min — AB (ref >90)
GFR, EST NON AFRICAN AMERICAN: 51 mL/min — AB (ref >90)
GLUCOSE: 115 mg/dL — AB (ref 70–99)
Potassium: 3.7 mmol/L (ref 3.5–5.1)
Sodium: 129 mmol/L — ABNORMAL LOW (ref 135–145)
TOTAL PROTEIN: 6.8 g/dL (ref 6.0–8.3)
Total Bilirubin: 0.6 mg/dL (ref 0.3–1.2)

## 2014-03-22 LAB — TROPONIN I: Troponin I: 0.3 ng/mL — ABNORMAL HIGH (ref <0.031)

## 2014-03-22 LAB — LIPASE, BLOOD: LIPASE: 103 U/L — AB (ref 11–59)

## 2014-03-22 LAB — BRAIN NATRIURETIC PEPTIDE: B Natriuretic Peptide: 212 pg/mL — ABNORMAL HIGH (ref 0.0–100.0)

## 2014-03-22 LAB — URINE MICROSCOPIC-ADD ON

## 2014-03-22 LAB — PROTIME-INR
INR: 1.13 (ref 0.00–1.49)
Prothrombin Time: 14.6 seconds (ref 11.6–15.2)

## 2014-03-22 MED ORDER — SODIUM CHLORIDE 0.9 % IV SOLN
INTRAVENOUS | Status: DC
  Administered 2014-03-22: 22:00:00 via INTRAVENOUS

## 2014-03-22 MED ORDER — IOHEXOL 300 MG/ML  SOLN: 50.0000 mL | Freq: Once | INTRAMUSCULAR | Status: DC | PRN

## 2014-03-22 MED ORDER — IOHEXOL 300 MG/ML  SOLN
50.0000 mL | Freq: Once | INTRAMUSCULAR | Status: AC | PRN
  Administered 2014-03-22: 50 mL via ORAL

## 2014-03-22 MED ORDER — SODIUM CHLORIDE 0.9 % IV BOLUS (SEPSIS)
250.0000 mL | Freq: Once | INTRAVENOUS | Status: AC
  Administered 2014-03-22: 250 mL via INTRAVENOUS

## 2014-03-22 MED ORDER — FENTANYL CITRATE 0.05 MG/ML IJ SOLN
6.2500 ug | Freq: Once | INTRAMUSCULAR | Status: AC
  Administered 2014-03-22: 6.5 ug via INTRAVENOUS
  Filled 2014-03-22: qty 2

## 2014-03-22 MED ORDER — SODIUM CHLORIDE 0.9 % IJ SOLN
INTRAMUSCULAR | Status: AC
  Administered 2014-03-22: 21:00:00
  Filled 2014-03-22: qty 45

## 2014-03-22 NOTE — ED Notes (Signed)
Pt reports "feeling bad for the last 6 or 7 weeks", reports bloody urine that began yesterday.

## 2014-03-23 DIAGNOSIS — I10 Essential (primary) hypertension: Secondary | ICD-10-CM

## 2014-03-23 DIAGNOSIS — R319 Hematuria, unspecified: Secondary | ICD-10-CM | POA: Diagnosis present

## 2014-03-23 DIAGNOSIS — Y95 Nosocomial condition: Secondary | ICD-10-CM | POA: Diagnosis present

## 2014-03-23 DIAGNOSIS — J189 Pneumonia, unspecified organism: Secondary | ICD-10-CM | POA: Diagnosis present

## 2014-03-23 DIAGNOSIS — Z86718 Personal history of other venous thrombosis and embolism: Secondary | ICD-10-CM | POA: Diagnosis not present

## 2014-03-23 DIAGNOSIS — I5032 Chronic diastolic (congestive) heart failure: Secondary | ICD-10-CM

## 2014-03-23 DIAGNOSIS — B964 Proteus (mirabilis) (morganii) as the cause of diseases classified elsewhere: Secondary | ICD-10-CM | POA: Diagnosis present

## 2014-03-23 DIAGNOSIS — Z9049 Acquired absence of other specified parts of digestive tract: Secondary | ICD-10-CM | POA: Diagnosis present

## 2014-03-23 DIAGNOSIS — E039 Hypothyroidism, unspecified: Secondary | ICD-10-CM | POA: Diagnosis present

## 2014-03-23 DIAGNOSIS — E119 Type 2 diabetes mellitus without complications: Secondary | ICD-10-CM | POA: Diagnosis present

## 2014-03-23 DIAGNOSIS — I129 Hypertensive chronic kidney disease with stage 1 through stage 4 chronic kidney disease, or unspecified chronic kidney disease: Secondary | ICD-10-CM | POA: Diagnosis present

## 2014-03-23 DIAGNOSIS — Z85038 Personal history of other malignant neoplasm of large intestine: Secondary | ICD-10-CM | POA: Diagnosis not present

## 2014-03-23 DIAGNOSIS — Z86711 Personal history of pulmonary embolism: Secondary | ICD-10-CM | POA: Diagnosis not present

## 2014-03-23 DIAGNOSIS — I2510 Atherosclerotic heart disease of native coronary artery without angina pectoris: Secondary | ICD-10-CM | POA: Diagnosis present

## 2014-03-23 DIAGNOSIS — Z880 Allergy status to penicillin: Secondary | ICD-10-CM | POA: Diagnosis not present

## 2014-03-23 DIAGNOSIS — E871 Hypo-osmolality and hyponatremia: Secondary | ICD-10-CM | POA: Diagnosis present

## 2014-03-23 DIAGNOSIS — I5042 Chronic combined systolic (congestive) and diastolic (congestive) heart failure: Secondary | ICD-10-CM | POA: Diagnosis present

## 2014-03-23 DIAGNOSIS — F0390 Unspecified dementia without behavioral disturbance: Secondary | ICD-10-CM | POA: Diagnosis present

## 2014-03-23 DIAGNOSIS — F17220 Nicotine dependence, chewing tobacco, uncomplicated: Secondary | ICD-10-CM | POA: Diagnosis present

## 2014-03-23 DIAGNOSIS — E780 Pure hypercholesterolemia: Secondary | ICD-10-CM | POA: Diagnosis present

## 2014-03-23 DIAGNOSIS — Z7901 Long term (current) use of anticoagulants: Secondary | ICD-10-CM | POA: Diagnosis not present

## 2014-03-23 DIAGNOSIS — E118 Type 2 diabetes mellitus with unspecified complications: Secondary | ICD-10-CM

## 2014-03-23 DIAGNOSIS — Z7982 Long term (current) use of aspirin: Secondary | ICD-10-CM | POA: Diagnosis not present

## 2014-03-23 DIAGNOSIS — J449 Chronic obstructive pulmonary disease, unspecified: Secondary | ICD-10-CM | POA: Diagnosis present

## 2014-03-23 DIAGNOSIS — N200 Calculus of kidney: Secondary | ICD-10-CM | POA: Diagnosis present

## 2014-03-23 DIAGNOSIS — I272 Other secondary pulmonary hypertension: Secondary | ICD-10-CM | POA: Diagnosis present

## 2014-03-23 DIAGNOSIS — N183 Chronic kidney disease, stage 3 (moderate): Secondary | ICD-10-CM | POA: Diagnosis present

## 2014-03-23 DIAGNOSIS — N390 Urinary tract infection, site not specified: Secondary | ICD-10-CM | POA: Diagnosis present

## 2014-03-23 DIAGNOSIS — R531 Weakness: Secondary | ICD-10-CM

## 2014-03-23 DIAGNOSIS — I482 Chronic atrial fibrillation: Secondary | ICD-10-CM | POA: Diagnosis present

## 2014-03-23 DIAGNOSIS — R05 Cough: Secondary | ICD-10-CM | POA: Diagnosis present

## 2014-03-23 DIAGNOSIS — Z885 Allergy status to narcotic agent status: Secondary | ICD-10-CM | POA: Diagnosis not present

## 2014-03-23 DIAGNOSIS — Z66 Do not resuscitate: Secondary | ICD-10-CM | POA: Diagnosis present

## 2014-03-23 DIAGNOSIS — K439 Ventral hernia without obstruction or gangrene: Secondary | ICD-10-CM | POA: Diagnosis present

## 2014-03-23 DIAGNOSIS — J438 Other emphysema: Secondary | ICD-10-CM

## 2014-03-23 LAB — CBC
HCT: 37.2 % — ABNORMAL LOW (ref 39.0–52.0)
Hemoglobin: 12 g/dL — ABNORMAL LOW (ref 13.0–17.0)
MCH: 32.3 pg (ref 26.0–34.0)
MCHC: 32.3 g/dL (ref 30.0–36.0)
MCV: 100.3 fL — ABNORMAL HIGH (ref 78.0–100.0)
PLATELETS: 164 10*3/uL (ref 150–400)
RBC: 3.71 MIL/uL — AB (ref 4.22–5.81)
RDW: 14.2 % (ref 11.5–15.5)
WBC: 9.3 10*3/uL (ref 4.0–10.5)

## 2014-03-23 LAB — BASIC METABOLIC PANEL
Anion gap: 9 (ref 5–15)
BUN: 29 mg/dL — ABNORMAL HIGH (ref 6–23)
CO2: 31 mmol/L (ref 19–32)
CREATININE: 1.18 mg/dL (ref 0.50–1.35)
Calcium: 9.1 mg/dL (ref 8.4–10.5)
Chloride: 99 mmol/L (ref 96–112)
GFR calc Af Amer: 61 mL/min — ABNORMAL LOW (ref >90)
GFR calc non Af Amer: 52 mL/min — ABNORMAL LOW (ref >90)
Glucose, Bld: 102 mg/dL — ABNORMAL HIGH (ref 70–99)
Potassium: 3.9 mmol/L (ref 3.5–5.1)
SODIUM: 139 mmol/L (ref 135–145)

## 2014-03-23 MED ORDER — ONDANSETRON HCL 4 MG PO TABS: 4.0000 mg | ORAL_TABLET | Freq: Four times a day (QID) | ORAL | Status: DC | PRN

## 2014-03-23 MED ORDER — IPRATROPIUM-ALBUTEROL 0.5-2.5 (3) MG/3ML IN SOLN
3.0000 mL | RESPIRATORY_TRACT | Status: DC
  Administered 2014-03-23 (×3): 3 mL via RESPIRATORY_TRACT
  Filled 2014-03-23 (×3): qty 3

## 2014-03-23 MED ORDER — IPRATROPIUM-ALBUTEROL 0.5-2.5 (3) MG/3ML IN SOLN
3.0000 mL | Freq: Three times a day (TID) | RESPIRATORY_TRACT | Status: DC
  Administered 2014-03-23 – 2014-03-25 (×5): 3 mL via RESPIRATORY_TRACT
  Filled 2014-03-23 (×5): qty 3

## 2014-03-23 MED ORDER — SODIUM CHLORIDE 0.9 % IJ SOLN
3.0000 mL | Freq: Two times a day (BID) | INTRAMUSCULAR | Status: DC
  Administered 2014-03-23 – 2014-03-24 (×4): 3 mL via INTRAVENOUS

## 2014-03-23 MED ORDER — VITAMIN B-12 1000 MCG PO TABS
1000.0000 ug | ORAL_TABLET | Freq: Every day | ORAL | Status: DC
  Administered 2014-03-23 – 2014-03-25 (×3): 1000 ug via ORAL
  Filled 2014-03-23 (×3): qty 1

## 2014-03-23 MED ORDER — ACETAMINOPHEN 325 MG PO TABS: 650.0000 mg | ORAL_TABLET | Freq: Once | ORAL | Status: DC

## 2014-03-23 MED ORDER — RIVAROXABAN 20 MG PO TABS
20.0000 mg | ORAL_TABLET | Freq: Every day | ORAL | Status: DC
  Administered 2014-03-23 – 2014-03-25 (×3): 20 mg via ORAL
  Filled 2014-03-23 (×3): qty 1

## 2014-03-23 MED ORDER — DEXTROSE 5 % IV SOLN
2.0000 g | Freq: Once | INTRAVENOUS | Status: AC
  Administered 2014-03-23: 2 g via INTRAVENOUS
  Filled 2014-03-23: qty 2

## 2014-03-23 MED ORDER — HYDROMORPHONE HCL 1 MG/ML IJ SOLN: 0.5000 mg | INTRAMUSCULAR | Status: DC | PRN

## 2014-03-23 MED ORDER — DEXTROSE 5 % IV SOLN
1.0000 g | Freq: Three times a day (TID) | INTRAVENOUS | Status: DC
  Administered 2014-03-23 – 2014-03-25 (×7): 1 g via INTRAVENOUS
  Filled 2014-03-23 (×10): qty 1

## 2014-03-23 MED ORDER — VANCOMYCIN HCL 10 G IV SOLR
1250.0000 mg | Freq: Once | INTRAVENOUS | Status: AC
  Administered 2014-03-23: 1250 mg via INTRAVENOUS
  Filled 2014-03-23: qty 1250

## 2014-03-23 MED ORDER — ALUM & MAG HYDROXIDE-SIMETH 200-200-20 MG/5ML PO SUSP: 30.0000 mL | Freq: Four times a day (QID) | ORAL | Status: DC | PRN

## 2014-03-23 MED ORDER — FAMOTIDINE 20 MG PO TABS
20.0000 mg | ORAL_TABLET | Freq: Two times a day (BID) | ORAL | Status: DC
  Administered 2014-03-23 – 2014-03-25 (×5): 20 mg via ORAL
  Filled 2014-03-23 (×5): qty 1

## 2014-03-23 MED ORDER — FUROSEMIDE 20 MG PO TABS
20.0000 mg | ORAL_TABLET | Freq: Every day | ORAL | Status: DC
  Administered 2014-03-24 – 2014-03-25 (×2): 20 mg via ORAL
  Filled 2014-03-23 (×2): qty 1

## 2014-03-23 MED ORDER — METOPROLOL TARTRATE 25 MG PO TABS
12.5000 mg | ORAL_TABLET | Freq: Two times a day (BID) | ORAL | Status: DC
  Administered 2014-03-23 – 2014-03-25 (×5): 12.5 mg via ORAL
  Filled 2014-03-23 (×5): qty 1

## 2014-03-23 MED ORDER — SODIUM CHLORIDE 0.9 % IV BOLUS (SEPSIS): 250.0000 mL | Freq: Once | INTRAVENOUS | Status: DC

## 2014-03-23 MED ORDER — ACETAMINOPHEN 650 MG RE SUPP: 650.0000 mg | Freq: Four times a day (QID) | RECTAL | Status: DC | PRN

## 2014-03-23 MED ORDER — ONDANSETRON HCL 4 MG/2ML IJ SOLN: 4.0000 mg | Freq: Four times a day (QID) | INTRAMUSCULAR | Status: DC | PRN

## 2014-03-23 MED ORDER — SODIUM CHLORIDE 0.9 % IV SOLN
INTRAVENOUS | Status: DC
  Administered 2014-03-23: 03:00:00 via INTRAVENOUS

## 2014-03-23 MED ORDER — SODIUM CHLORIDE 0.9 % IV BOLUS (SEPSIS): 500.0000 mL | Freq: Once | INTRAVENOUS | Status: DC

## 2014-03-23 MED ORDER — ASPIRIN 81 MG PO CHEW
81.0000 mg | CHEWABLE_TABLET | Freq: Every day | ORAL | Status: DC
  Administered 2014-03-23 – 2014-03-25 (×3): 81 mg via ORAL
  Filled 2014-03-23 (×3): qty 1

## 2014-03-23 MED ORDER — VANCOMYCIN HCL IN DEXTROSE 1-5 GM/200ML-% IV SOLN
1000.0000 mg | INTRAVENOUS | Status: DC
  Administered 2014-03-23 – 2014-03-24 (×2): 1000 mg via INTRAVENOUS
  Filled 2014-03-23 (×3): qty 200

## 2014-03-23 MED ORDER — SODIUM CHLORIDE 0.9 % IV SOLN: INTRAVENOUS | Status: DC

## 2014-03-23 MED ORDER — LEVOTHYROXINE SODIUM 50 MCG PO TABS
50.0000 ug | ORAL_TABLET | Freq: Every day | ORAL | Status: DC
  Administered 2014-03-23 – 2014-03-25 (×3): 50 ug via ORAL
  Filled 2014-03-23 (×3): qty 1

## 2014-03-23 MED ORDER — ZOLPIDEM TARTRATE 5 MG PO TABS
5.0000 mg | ORAL_TABLET | Freq: Every evening | ORAL | Status: DC | PRN
  Administered 2014-03-23: 5 mg via ORAL
  Filled 2014-03-23: qty 1

## 2014-03-23 MED ORDER — ATORVASTATIN CALCIUM 40 MG PO TABS
80.0000 mg | ORAL_TABLET | Freq: Every day | ORAL | Status: DC
  Administered 2014-03-23 – 2014-03-25 (×3): 80 mg via ORAL
  Filled 2014-03-23 (×3): qty 2

## 2014-03-23 MED ORDER — ACETAMINOPHEN 325 MG PO TABS: 650.0000 mg | ORAL_TABLET | Freq: Four times a day (QID) | ORAL | Status: DC | PRN

## 2014-03-23 NOTE — Discharge Summary (Deleted)
[facility] Progress Note   Bryce Mcdonald IRS:[id number] DOB: 11-May-1921 DOA: 03/22/2014 PCP: PROVIDER NOT IN SYSTEM  Brief narrative: Bryce Mcdonald is a 81 y.o. male with a history of COPD, CAD, HTN, Chronic Atrial fibrillation, Chronic combined CHF, DM2,CKD Stage III, and Remote hx of Colon Cancer who was brogut to the ED due to gross hematuria x 1 day. he also complained of a cough and fevers. He was found to have pneumonia.    Subjective: Cough is slightly better. No other complaints.   Assessment/Plan:  HCAP (healthcare-associated pneumonia) cont IV Vancomycin and Aztreonam Duonebs O2 PRN    Active Problems:   Hematuria resolved- cont Xarelto  Type 2 diabetes mellitus Diet Controlled SSI coverage ordered Check HbA1C   Hypothyroidism Continue Levothyropxine Rx    Calculus of left kidney and ventral hernia Non-Obstructing calculus on ABD CT scan- ventral hernia not strangulated   Chronic diastolic congestive heart failure hold Lasix due to hyponatremia- cont Metoprolol Rx Monitor for Signs of Decompensation   Chronic atrial fibrillation On Xarelto Rx   CAD (coronary artery disease) On Metoprolol, ASA, and Atorvastatin Rx   Hyponatremia Resolved with NS Hold Lasix for today- resume tomorrow    COPD (chronic obstructive pulmonary disease)                         No exacerbtion DuoNebs O2 PRN    Code Status: DNR Family Communication:  Disposition Plan: home when stable DVT  prophylaxis: Xarelto  Consultants:  Procedures:   Antibiotics: Anti-infectives    Start     Dose/Rate Route Frequency Ordered Stop   03/23/14 1800  vancomycin (VANCOCIN) IVPB 1000 mg/200 mL premix     1,000 mg200 mL/hr over 60 Minutes Intravenous Every 24 hours 03/23/14 0903     03/23/14 1000  aztreonam (AZACTAM) 1 g in dextrose 5 % 50 mL IVPB     1 g100 mL/hr over 30 Minutes Intravenous Every 8 hours 03/23/14 0749     03/23/14 0045  vancomycin (VANCOCIN) 1,250 mg in sodium chloride 0.9 % 250 mL IVPB     1,250 mg166.7 mL/hr over 90 Minutes Intravenous  Once 03/23/14 0033 03/23/14 0215   03/23/14 0015  aztreonam (AZACTAM) 2 g in dextrose 5 % 50 mL IVPB     2 g100 mL/hr over 30 Minutes Intravenous  Once 03/23/14 0004 03/23/14 0102         Objective: Filed Weights   03/22/14 1959 03/23/14 0218  Weight: 81.194 kg (179 lb) 78 kg (171 lb 15.3 oz)    Intake/Output Summary (Last 24 hours) at 03/23/14 1425 Last data filed at 03/23/14 1136  Gross per 24 hour  Intake      3 ml  Output    900 ml  Net   -897 ml     Vitals Filed Vitals:   03/23/14 0540 03/23/14 0751 03/23/14 0900 03/23/14 1220  BP: 121/67  126/64   Pulse: 58  72   Temp: 97.8 F (36.6 C)  98.9 F (37.2 C)   TempSrc: Oral  Temporal   Resp: 20     Height:      Weight:      SpO2: 100% 95% 93% 95%    Exam: General: AAO x3, No acute respiratory distress Lungs: Clear to auscultation bilaterally without wheezes or crackles Cardiovascular: Regular rate and rhythm without murmur gallop or rub normal S1 and S2 Abdomen: Nontender, nondistended, soft, bowel sounds positive, no rebound, no ascites,  no appreciable mass Extremities: No significant cyanosis, clubbing, or edema bilateral lower extremities  Data Reviewed: Basic Metabolic Panel:  Recent Labs Lab 03/17/14 0229 03/18/14 0633 03/19/14 0607 03/19/14 1156 03/22/14 2037 03/23/14 0523  NA 137 137 138  --  129* 139  K 4.4 4.7 5.0  --  3.7 3.9  CL 96  94* 96  --  95* 99  CO2 34* 32 33*  --  28 31  GLUCOSE 118* 159* 151* 350* 115* 102*  BUN 33* 37* 41*  --  33* 29*  CREATININE 1.16 1.13 1.18  --  1.21 1.18  CALCIUM 9.5 9.9 10.1  --  8.8 9.1   Liver Function Tests:  Recent Labs Lab 03/22/14 2037  AST 26  ALT 25  ALKPHOS 81  BILITOT 0.6  PROT 6.8  ALBUMIN 3.5    Recent Labs Lab 03/22/14 2037  LIPASE 103*   No results for input(s): AMMONIA in the last 168 hours. CBC:  Recent Labs Lab 03/18/14 0633 03/22/14 2036 03/23/14 0523  WBC 6.9 10.6* 9.3  NEUTROABS  --  6.5  --   HGB 13.4 12.9* 12.0*  HCT 40.8 38.8* 37.2*  MCV 100.7* 99.0 100.3*  PLT 194 179 164   Cardiac Enzymes:  Recent Labs Lab 03/16/14 2006 03/16/14 2230 03/17/14 0229 03/17/14 0706 03/22/14 2036  TROPONINI 0.20* 0.20* 0.22* 0.21* 0.30*   BNP (last 3 results)  Recent Labs  07/26/13 1128 11/16/13 0839 01/10/14 1046  PROBNP 2036.0* 770.7* 1366.0*   CBG:  Recent Labs Lab 03/19/14 1707 03/19/14 2043 03/20/14 0713 03/20/14 1138 03/20/14 1700  GLUCAP 131* 168* 119* 169* 267*    Recent Results (from the past 240 hour(s))  Clostridium Difficile by PCR     Status: None   Collection Time: 03/16/14 10:34 AM  Result Value Ref Range Status   C difficile by pcr NEGATIVE NEGATIVE Final     Studies:  Recent x-ray studies have been reviewed in detail by the Attending Physician  Scheduled Meds:  Scheduled Meds: . acetaminophen  650 mg Oral Once  . aspirin  81 mg Oral Daily  . atorvastatin  80 mg Oral Daily  . aztreonam  1 g Intravenous Q8H  . famotidine  20 mg Oral BID  . [START ON 03/24/2014] furosemide  20 mg Oral Daily  . ipratropium-albuterol  3 mL Nebulization TID  . levothyroxine  50 mcg Oral QAC breakfast  . metoprolol tartrate  12.5 mg Oral BID  . rivaroxaban  20 mg Oral Q breakfast  . sodium chloride  3 mL Intravenous Q12H  . vancomycin  1,000 mg Intravenous Q24H  . vitamin B-12  1,000 mcg Oral Daily   Continuous  Infusions:   Time spent on care of this patient:35 min   [place], MD 03/23/2014, 2:25 PM  LOS: 1 day   [facility] Office  [phone] Pager - Text Page per [email or web address]  If 7PM-7AM, please contact night-coverage [email or web address]

## 2014-03-23 NOTE — Progress Notes (Signed)
[facility] Progress Note   Jaret Coppedge MBW:[id number] DOB: 06/20/1921 DOA: 03/22/2014 PCP: PROVIDER NOT IN SYSTEM  Brief narrative: Bryce Mcdonald is a 81 y.o. male with a history of COPD, CAD, HTN, Chronic Atrial fibrillation, Chronic combined CHF, DM2,CKD Stage III, and Remote hx of Colon Cancer who was brogut to the ED due to gross hematuria x 1 day. he also complained of a cough and fevers. He was found to have pneumonia.    Subjective: Cough is slightly better. No other complaints.   Assessment/Plan:  HCAP (healthcare-associated pneumonia) cont IV Vancomycin and Aztreonam Duonebs O2 PRN    Active Problems:  Hematuria resolved- cont Xarelto  Type 2 diabetes mellitus Diet Controlled SSI coverage ordered Check HbA1C   Hypothyroidism Continue Levothyropxine Rx    Calculus of left kidney and ventral hernia Non-Obstructing calculus on ABD CT scan- ventral hernia not strangulated   Chronic diastolic congestive heart failure hold Lasix due to hyponatremia- cont Metoprolol Rx Monitor for Signs of Decompensation   Chronic atrial fibrillation On Xarelto Rx   CAD (coronary artery disease) On Metoprolol, ASA, and Atorvastatin Rx   Hyponatremia Resolved with NS Hold Lasix for today- resume tomorrow    COPD (chronic obstructive pulmonary disease)  No exacerbtion DuoNebs O2 PRN    Code Status: DNR Family Communication:  Disposition Plan: home when stable DVT  prophylaxis: Xarelto  Consultants:  Procedures:   Antibiotics: Anti-infectives    Start   Dose/Rate Route Frequency Ordered Stop   03/23/14 1800  vancomycin (VANCOCIN) IVPB 1000 mg/200 mL premix    1,000 mg200 mL/hr over 60 Minutes Intravenous Every 24 hours 03/23/14 0903    03/23/14 1000  aztreonam (AZACTAM) 1 g in dextrose 5 % 50 mL IVPB    1 g100 mL/hr over 30 Minutes Intravenous Every 8 hours 03/23/14 0749    03/23/14 0045  vancomycin (VANCOCIN) 1,250 mg in sodium chloride 0.9 % 250 mL IVPB    1,250 mg166.7 mL/hr over 90 Minutes Intravenous Once 03/23/14 0033 03/23/14 0215   03/23/14 0015  aztreonam (AZACTAM) 2 g in dextrose 5 % 50 mL IVPB    2 g100 mL/hr over 30 Minutes Intravenous Once 03/23/14 0004 03/23/14 0102         Objective: Filed Weights   03/22/14 1959 03/23/14 0218  Weight: 81.194 kg (179 lb) 78 kg (171 lb 15.3 oz)    Intake/Output Summary (Last 24 hours) at 03/23/14 1144 Last data filed at 03/23/14 1136  Gross per 24 hour  Intake  3 ml  Output  900 ml  Net  -897 ml     Vitals Filed Vitals:   03/23/14 0314 03/23/14 0540 03/23/14 0751 03/23/14 0900  BP:  121/67  126/64  Pulse:  58  72  Temp:  97.8 F (36.6 C)  98.9 F (37.2 C)  TempSrc:  Oral  Temporal  Resp:  20    Height:      Weight:      SpO2: 96% 100% 95% 93%    Exam: General: AAO x3, No acute respiratory distress Lungs: Clear to auscultation bilaterally without wheezes or crackles Cardiovascular: Regular rate and rhythm without murmur gallop or rub normal S1 and S2 Abdomen: Nontender, nondistended, soft, bowel sounds positive, no rebound, no ascites, no appreciable mass Extremities: No significant cyanosis, clubbing, or edema bilateral lower extremities  Data Reviewed: Basic Metabolic Panel:  Last Labs      Recent Labs Lab 03/17/14 0229 03/18/14 0177  03/19/14 0607 03/19/14 1156 03/22/14 2037  03/23/14 0523  NA 137 137 138 --  129* 139  K 4.4 4.7 5.0 --  3.7 3.9  CL 96 94* 96 --  95* 99  CO2 34* 32 33* --  28 31  GLUCOSE 118* 159* 151* 350* 115* 102*  BUN 33* 37* 41* --  33* 29*  CREATININE 1.16 1.13 1.18 --  1.21 1.18  CALCIUM 9.5 9.9 10.1 --  8.8 9.1     Liver Function Tests:  Last Labs      Recent Labs Lab 03/22/14 2037  AST 26  ALT 25  ALKPHOS 81  BILITOT 0.6  PROT 6.8  ALBUMIN 3.5      Last Labs      Recent Labs Lab 03/22/14 2037  LIPASE 103*      Last Labs     No results for input(s): AMMONIA in the last 168 hours.   CBC:  Last Labs      Recent Labs Lab 03/18/14 0633 03/22/14 2036 03/23/14 0523  WBC 6.9 10.6* 9.3  NEUTROABS --  6.5 --   HGB 13.4 12.9* 12.0*  HCT 40.8 38.8* 37.2*  MCV 100.7* 99.0 100.3*  PLT 194 179 164     Cardiac Enzymes:  Last Labs      Recent Labs Lab 03/16/14 2006 03/16/14 2230 03/17/14 0229 03/17/14 0706 03/22/14 2036  TROPONINI 0.20* 0.20* 0.22* 0.21* 0.30*     BNP (last 3 results)  Recent Labs (within last 365 days)     Recent Labs  07/26/13 1128 11/16/13 0839 01/10/14 1046  PROBNP 2036.0* 770.7* 1366.0*     CBG:  Last Labs      Recent Labs Lab 03/19/14 1707 03/19/14 2043 03/20/14 0713 03/20/14 1138 03/20/14 1700  GLUCAP 131* 168* 119* 169* 267*      Recent Results (from the past 240 hour(s))  Clostridium Difficile by PCR Status: None   Collection Time: 03/16/14 10:34 AM  Result Value Ref Range Status   C difficile by pcr NEGATIVE NEGATIVE Final     Studies:  Recent x-ray studies have been reviewed in detail by the Attending Physician  Scheduled Meds:  Scheduled Meds: . sodium chloride  Intravenous STAT  . acetaminophen 650 mg Oral Once  . aspirin  81 mg Oral Daily  . atorvastatin 80 mg Oral Daily  . aztreonam 1 g Intravenous Q8H  . famotidine 20 mg Oral BID  . ipratropium-albuterol 3 mL Nebulization Q4H  . levothyroxine 50 mcg Oral QAC breakfast  . metoprolol tartrate 12.5 mg Oral BID  . rivaroxaban 20 mg Oral Q breakfast  . sodium chloride 3 mL Intravenous Q12H  . vancomycin 1,000 mg Intravenous Q24H  . vitamin B-12 1,000 mcg Oral Daily   Continuous Infusions:   Time spent on care of this patient:35 min   Black Hammock, MD 03/23/2014, 11:44 AM  LOS: 1 day   [facility] Office [phone] Pager - Text Page per [email or web address]  If 7PM-7AM, please contact night-coverage [email or web address]            Routing History     Date/Time From To Method   03/23/2014 11:50 AM Debbe Odea, MD Provider Not In System In Basket

## 2014-03-23 NOTE — ED Provider Notes (Signed)
CSN: [id number]     Arrival date & time 03/22/14  1952 History   First MD Initiated Contact with Patient 03/22/14 2028     Chief Complaint  Patient presents with  . Weakness  . Hematuria  . Abdominal Pain     Patient is a 81 y.o. male presenting with weakness, hematuria, and abdominal pain. The history is provided by a caregiver, a relative and the patient. The history is limited by the condition of the patient (Hx dementia).  Weakness Associated symptoms include abdominal pain.  Hematuria Associated symptoms include abdominal pain.  Abdominal Pain Associated symptoms: hematuria   Pt was seen at 2030. Per pt and his family: c/o hematuria that began this afternoon PTA. Pt states he has "felt bad for the past 6 or 7 weeks."  Pt was d/c from the hospital 2 days ago for dx DOE.  Pt's family states they have been giving pt lovenox SQ injections and pt "took a xarelto today." Pt himself states he "just feels bad" and his "stomach hurts." Denies CP, no N/V/D, no fevers.     Past Medical History  Diagnosis Date  . Colon cancer     Status post partial colectomy  . Hypertension   . Hypercholesterolemia   . Blood transfusion   . DVT (deep venous thrombosis)     a. Prior h/o such. b. Recurrent subacute bilat DVT 2014.  . PE (pulmonary embolism)   . Diabetes mellitus   . Chronic atrial fibrillation   . Diabetes mellitus, type II   . Ventral hernia   . Unspecified hypothyroidism   . LVH (left ventricular hypertrophy)   . Bilateral renal cysts   . Calculus of left kidney     non-obstructing  . COPD (chronic obstructive pulmonary disease)   . Chronic combined systolic and diastolic CHF (congestive heart failure)     a. Echo 07/2013: EF 40-50%, mod LVH, mild MR, mildly dilated LA, mild-mod TR, PA pressure 59mmHg.  Marland Kitchen RBBB with left anterior fascicular block   . Bradycardia   . Pulmonary hypertension     a. Echo 07/2013: PASP 22mmHg.  . Mild mitral regurgitation     a. By echo 07/2013.  .  Tricuspid regurgitation     a. Mild-mod by echo 07/2013.  . Orthostatic hypotension   . Dementia   . CAD (coronary artery disease)     a. Nonobstructive by cath in 2008. b. Nuc 08/2013: low to intermediate risk. Imaging c/w scar in the inferior wall without any large ischemic territories. LVEF 37% with fairly diffuse hypokinesis.   . Syncope   . Ventral hernia   . Pulmonary nodules   . CKD (chronic kidney disease), stage III     a. based on historical lab data.   Past Surgical History  Procedure Laterality Date  . Abdominal surgery      x 3  . Cardiac catheterization  2008   Family History  Problem Relation Age of Onset  . Hypertension     History  Substance Use Topics  . Smoking status: Former Smoker -- 0.50 packs/day    Types: Cigarettes    Start date: 11/15/1943    Quit date: 02/20/1973  . Smokeless tobacco: Former Systems developer    Types: Chew     Comment: quit over 40 years ago  . Alcohol Use: No     Comment: quit over forty years ago    Review of Systems  Unable to perform ROS: Dementia  Gastrointestinal: Positive for  abdominal pain.  Genitourinary: Positive for hematuria.  Neurological: Positive for weakness.      Allergies  Codeine; Penicillins; and Morphine and related  Home Medications   Prior to Admission medications   Medication Sig Start Date End Date Taking? Authorizing Provider  albuterol (PROVENTIL) (2.5 MG/3ML) 0.083% nebulizer solution Take 3 mLs (2.5 mg total) by nebulization every 4 (four) hours as needed for wheezing or shortness of breath. 03/20/14  Yes Kathie Dike, MD  aspirin 81 MG chewable tablet Chew 81 mg by mouth daily.   Yes Historical Provider, MD  atorvastatin (LIPITOR) 80 MG tablet Take 80 mg by mouth daily.   Yes Historical Provider, MD  azithromycin (ZITHROMAX) 500 MG tablet Take 1 tablet (500 mg total) by mouth daily. 03/20/14  Yes Kathie Dike, MD  enoxaparin (LOVENOX) 100 MG/ML injection Inject 100 mg into the skin every 12 (twelve)  hours.   Yes Historical Provider, MD  famotidine (PEPCID) 20 MG tablet Take 1 tablet (20 mg total) by mouth 2 (two) times daily. 12/06/12  Yes Lezlie Octave Black, NP  furosemide (LASIX) 20 MG tablet Take 1 tablet (20 mg total) by mouth daily. 03/20/14  Yes Kathie Dike, MD  levothyroxine (SYNTHROID, LEVOTHROID) 50 MCG tablet Take 50 mcg by mouth daily before breakfast.   Yes Historical Provider, MD  metoprolol tartrate (LOPRESSOR) 25 MG tablet Take 12.5 mg by mouth 2 (two) times daily.   Yes Historical Provider, MD  predniSONE (DELTASONE) 10 MG tablet Take 40mg  po daily for 3 days then 30mg  po daily for 3 days then 20mg  po daily for 3 days then 10mg  po daily for 3 days then stop Patient taking differently: Take 10-40 mg by mouth See admin instructions. Take 40mg  po daily for 3 days then 30mg  po daily for 3 days then 20mg  po daily for 3 days then 10mg  po daily for 3 days then stop 03/20/14  Yes Kathie Dike, MD  rivaroxaban (XARELTO) 20 MG TABS tablet Take 1 tablet (20 mg total) by mouth daily with breakfast. 03/20/14  Yes Kathie Dike, MD  tiotropium (SPIRIVA HANDIHALER) 18 MCG inhalation capsule Place 1 capsule (18 mcg total) into inhaler and inhale daily. 03/20/14  Yes Kathie Dike, MD  vitamin B-12 (CYANOCOBALAMIN) 1000 MCG tablet Take 1,000 mcg by mouth daily.   Yes Historical Provider, MD  acetaminophen (TYLENOL) 325 MG tablet Take 650 mg by mouth every 6 (six) hours as needed for mild pain or fever.    Historical Provider, MD  albuterol (PROVENTIL HFA) 108 (90 BASE) MCG/ACT inhaler Inhale 2 puffs into the lungs every 6 (six) hours as needed for wheezing or shortness of breath.    Historical Provider, MD   BP 101/60 mmHg  Pulse 65  Temp(Src) 98.6 F (37 C) (Oral)  Resp 21  Ht 5\' 8"  (1.727 m)  Wt 179 lb (81.194 kg)  BMI 27.22 kg/m2  SpO2 97% Physical Exam  2035: Physical examination:  Nursing notes reviewed; Vital signs and O2 SAT reviewed;  Constitutional: Well developed, Well nourished,  In no acute distress; Head:  Normocephalic, atraumatic; Eyes: EOMI, PERRL, No scleral icterus; ENMT: Mouth and pharynx normal, Mucous membranes dry; Neck: Supple, Full range of motion, No lymphadenopathy; Cardiovascular: Irregular irregular rate and rhythm, No gallop; Respiratory: Breath sounds clear & equal bilaterally, No wheezes.  Speaking full sentences with ease, Normal respiratory effort/excursion; Chest: Nontender, Movement normal; Abdomen: Soft, +mild diffuse tenderness to palp. Multiple well healed abd wall surgical scars. Nondistended, Normal bowel sounds; Genitourinary: No  CVA tenderness; Extremities: Pulses normal, No tenderness, No edema, No calf edema or asymmetry.; Neuro: Awake, alert, confused re: time, place, events per hx dementia. Major CN grossly intact.  Speech clear. Moves all extremities on stretcher spontaneously..; Skin: Color normal, Warm, Dry.   ED Course  Procedures     EKG Interpretation   Date/Time:  Sunday March 22 2014 19:58:48 EST Ventricular Rate:  47 PR Interval:    QRS Duration: 136 QT Interval:  481 QTC Calculation: 425 R Axis:   -92 Text Interpretation:  Atrial fibrillation Ventricular premature complex  Left axis deviation Right bundle branch block Inferior infarct, old When  compared with ECG of 03/16/2014 No significant change was found Confirmed  by [facility]  MD, Nunzio Cory [phone]) on 03/22/2014 10:37:46 PM      MDM  MDM Reviewed: previous chart, nursing note and vitals Reviewed previous: labs and ECG Interpretation: labs, ECG, x-ray and CT scan Total time providing critical care: 30-74 minutes. This excludes time spent performing separately reportable procedures and services. Consults: admitting MD   CRITICAL CARE Performed by: Alfonzo Feller Total critical care time: 35 Critical care time was exclusive of separately billable procedures and treating other patients. Critical care was necessary to treat or prevent imminent or  life-threatening deterioration. Critical care was time spent personally by me on the following activities: development of treatment plan with patient and/or surrogate as well as nursing, discussions with consultants, evaluation of patient's response to treatment, examination of patient, obtaining history from patient or surrogate, ordering and performing treatments and interventions, ordering and review of laboratory studies, ordering and review of radiographic studies, pulse oximetry and re-evaluation of patient's condition.   Results for orders placed or performed during the hospital encounter of 03/22/14  CBC with Differential  Result Value Ref Range   WBC 10.6 (H) 4.0 - 10.5 K/uL   RBC 3.92 (L) 4.22 - 5.81 MIL/uL   Hemoglobin 12.9 (L) 13.0 - 17.0 g/dL   HCT 38.8 (L) 39.0 - 52.0 %   MCV 99.0 78.0 - 100.0 fL   MCH 32.9 26.0 - 34.0 pg   MCHC 33.2 30.0 - 36.0 g/dL   RDW 14.3 11.5 - 15.5 %   Platelets 179 150 - 400 K/uL   Neutrophils Relative % 62 43 - 77 %   Neutro Abs 6.5 1.7 - 7.7 K/uL   Lymphocytes Relative 26 12 - 46 %   Lymphs Abs 2.8 0.7 - 4.0 K/uL   Monocytes Relative 12 3 - 12 %   Monocytes Absolute 1.3 (H) 0.1 - 1.0 K/uL   Eosinophils Relative 0 0 - 5 %   Eosinophils Absolute 0.0 0.0 - 0.7 K/uL   Basophils Relative 0 0 - 1 %   Basophils Absolute 0.0 0.0 - 0.1 K/uL  Troponin I  Result Value Ref Range   Troponin I 0.30 (H) <0.031 ng/mL  Protime-INR  Result Value Ref Range   Prothrombin Time 14.6 11.6 - 15.2 seconds   INR 1.13 0.00 - 1.49  Urinalysis, Routine w reflex microscopic  Result Value Ref Range   Color, Urine STRAW (A) YELLOW   APPearance CLEAR CLEAR   Specific Gravity, Urine <1.005 (L) 1.005 - 1.030   pH 6.0 5.0 - 8.0   Glucose, UA NEGATIVE NEGATIVE mg/dL   Hgb urine dipstick LARGE (A) NEGATIVE   Bilirubin Urine NEGATIVE NEGATIVE   Ketones, ur NEGATIVE NEGATIVE mg/dL   Protein, ur 30 (A) NEGATIVE mg/dL   Urobilinogen, UA 0.2 0.0 - 1.0 mg/dL  Nitrite  NEGATIVE NEGATIVE   Leukocytes, UA TRACE (A) NEGATIVE  Lactic acid, plasma  Result Value Ref Range   Lactic Acid, Venous 1.3 0.5 - 2.0 mmol/L  Brain natriuretic peptide  Result Value Ref Range   B Natriuretic Peptide 212.0 (H) 0.0 - 100.0 pg/mL  Comprehensive metabolic panel  Result Value Ref Range   Sodium 129 (L) 135 - 145 mmol/L   Potassium 3.7 3.5 - 5.1 mmol/L   Chloride 95 (L) 96 - 112 mmol/L   CO2 28 19 - 32 mmol/L   Glucose, Bld 115 (H) 70 - 99 mg/dL   BUN 33 (H) 6 - 23 mg/dL   Creatinine, Ser 1.21 0.50 - 1.35 mg/dL   Calcium 8.8 8.4 - 10.5 mg/dL   Total Protein 6.8 6.0 - 8.3 g/dL   Albumin 3.5 3.5 - 5.2 g/dL   AST 26 0 - 37 U/L   ALT 25 0 - 53 U/L   Alkaline Phosphatase 81 39 - 117 U/L   Total Bilirubin 0.6 0.3 - 1.2 mg/dL   GFR calc non Af Amer 51 (L) >90 mL/min   GFR calc Af Amer 59 (L) >90 mL/min   Anion gap 6 5 - 15  Lipase, blood  Result Value Ref Range   Lipase 103 (H) 11 - 59 U/L  Urine microscopic-add on  Result Value Ref Range   Squamous Epithelial / LPF FEW (A) RARE   WBC, UA 3-6 <3 WBC/hpf   RBC / HPF 7-10 <3 RBC/hpf   Bacteria, UA FEW (A) RARE   Ct Abdomen Pelvis Wo Contrast 03/22/2014   CLINICAL DATA:  Feeling bad for 6-7 weeks, bloody urine beginning yesterday. History of diabetes, kidney stones, ventral hernia, colon and prostate cancer.  EXAM: CT ABDOMEN AND PELVIS WITHOUT CONTRAST  TECHNIQUE: Multidetector CT imaging of the abdomen and pelvis was performed following the standard protocol without IV contrast. Oral contrast administered.  COMPARISON:  CT of the abdomen and pelvis November 20, 2013  FINDINGS: LUNG BASES: Heart size upper limits of normal, no pericardial fluid collections. Lung base scarring, similar to prior examination with superimposed RIGHT lower lobe bronchial wall thickening, small consolidation, axial 7/23. Additional patchy ground-glass opacities in RIGHT lung base.  KIDNEYS/BLADDER: Kidneys are orthotopic, demonstrating normal size and  morphology. 2 mm nonobstructing LEFT interpolar nephrolithiasis. No hydronephrosis; limited assessment for renal masses on this nonenhanced examination. LEFT renal cysts measure up to 2.3 cm, stable. The unopacified ureters are normal in course and caliber. Urinary bladder is partially distended and unremarkable.  SOLID ORGANS: The liver, spleen, pancreas and adrenal glands are unremarkable for this non-contrast examination. A few tiny layering gallstones without CT findings of acute cholecystitis.  GASTROINTESTINAL TRACT: The stomach, small and large bowel are normal in course and caliber without inflammatory changes, the sensitivity may be decreased by lack of enteric contrast. Normal appendix.  PERITONEUM/RETROPERITONEUM: No intraperitoneal free fluid nor free air. Aortoiliac vessels are normal in course, moderate to severe calcific atherosclerosis. Mildly ectatic LEFT Common iliac artery, unchanged No lymphadenopathy by CT size criteria. Prostate is present, mildly enlarged. Platelike calcifications are RIGHT retroperitoneum may be postsurgical.  SOFT TISSUES/ OSSEOUS STRUCTURES: Nonsuspicious. Rectus abdominus diastases, wide neck ventral hernia containing fat and small bowel, unchanged. Anterior abdominal wall scarring Nodules in the LEFT anterior abdominal wall subcutaneous fat may reflect injection granulomas. Small bilateral fat containing inguinal hernias. Somewhat fatty paraspinal muscles, fatty pelvic girdle muscles can be seen with remote infectious process such is polio though is  nonspecific. Grade 1 L4-5 anterolisthesis on degenerative basis, severe facet arthropathy resulting in severe L4-5 and L5-S1 neural foraminal narrowing.  IMPRESSION: Included view of the chest demonstrates RIGHT lower lobe small consolidation, bronchial wall thickening patchy ground-glass opacities concerning for pneumonia. Recommend follow-up.  No acute intra-abdominal or pelvic process. 2 mm nonobstructing LEFT interpolar  nephrolithiasis.  Wide neck ventral hernia without CT findings of bowel obstruction or strangulation.   Electronically Signed   By: Elon Alas   On: 03/22/2014 23:51   Dg Chest 1 View 03/22/2014   CLINICAL DATA:  Hematuria and weakness  EXAM: CHEST - 1 VIEW  COMPARISON:  March 16, 2014  FINDINGS: The previously noted nodular opacity in the right mid lung region is no longer appreciable. There is underlying emphysematous change with areas of scarring bilaterally. There is no frank edema or consolidation. Heart is normal in size with pulmonary vascularity within normal limits. There is atherosclerotic change in the aorta. No adenopathy.  IMPRESSION: Underlying emphysematous change. Areas of mild scarring. No edema or consolidation.   Electronically Signed   By: Lowella Grip M.D.   On: 03/22/2014 21:14    0020:  Mild troponin elevation appears to be pt's baseline. EKG is unchanged from previous and pt denies CP.  H/H per baseline. New hyponatremia on labs and BP lower than pt's baseline; will continue judicious IVF. Will start IV abx for HCAP. Dx and testing d/w pt and family.  Questions answered.  Verb understanding, agreeable to admit.  T/C to [facility]. Arnoldo Morale, case discussed, including:  HPI, pertinent PM/SHx, VS/PE, dx testing, ED course and treatment:  Agreeable to admit, requests to write temporary orders, obtain tele bed to team APAdmits.   Francine Graven, DO 03/24/14 [phone]

## 2014-03-23 NOTE — H&P (Signed)
[facility] Admission History and Physical       Steele Stracener YPP:[id number] DOB: 06/28/1921 DOA: 03/22/2014  Referring physician: EDP PCP: PROVIDER NOT IN SYSTEM  Specialists:   Chief Complaint: Bloody Urine  HPI: Bryce Mcdonald is a 81 y.o. male with a history of COPD, CAD,  HTN, Chronic Atrial fibrillation, Chronic combined CHF, DM2,CKD Stage III, and  Remote hx of Colon Cancer who was brogut to the ED due to gross hematuria x 1 day.  An ABD Ct Scan was performed and he was found to incidentally have a RLL Pneumonia, but no acute GI findings, and a non-Obstructing Left Renal Stone.   He was  placed on IV Vancomycin and Aztreonam to cover HCAP and was referred for admission.  His Lovenox was discontinued.    He was discharged 2 days ago from the hospital for a CODP exacerbation.  Review of Systems:  Constitutional: No Weight Loss, No Weight Gain, Night Sweats, Fevers, Chills, Dizziness, Fatigue, or Generalized Weakness HEENT: No Headaches, Difficulty Swallowing,Tooth/Dental Problems,Sore Throat,  No Sneezing, Rhinitis, Ear Ache, Nasal Congestion, or Post Nasal Drip,  Cardio-vascular:  No Chest pain, Orthopnea, PND, Edema in Lower Extremities, Anasarca, Dizziness, Palpitations  Resp: No Dyspnea, No DOE, No Productive Cough, No Non-Productive Cough, No Hemoptysis, No Wheezing.    GI: No Heartburn, Indigestion, Abdominal Pain, Nausea, Vomiting, Diarrhea, Hematemesis, Hematochezia, Melena, Change in Bowel Habits,  Loss of Appetite  GU: No Dysuria, Change in Color of Urine, No Urgency or Frequency, No Flank pain.  Musculoskeletal: No Joint Pain or Swelling, No Decreased Range of Motion, No Back Pain.  Neurologic: No Syncope, No Seizures, Muscle Weakness, Paresthesia, Vision Disturbance or Loss, No Diplopia, No Vertigo, No Difficulty Walking,  Skin: No Rash or Lesions. Psych: No Change in Mood or Affect, No Depression or Anxiety, No Memory loss, No Confusion, or  Hallucinations   Past Medical History  Diagnosis Date  . Colon cancer     Status post partial colectomy  . Hypertension   . Hypercholesterolemia   . Blood transfusion   . DVT (deep venous thrombosis)     a. Prior h/o such. b. Recurrent subacute bilat DVT 2014.  . PE (pulmonary embolism)   . Diabetes mellitus   . Chronic atrial fibrillation   . Diabetes mellitus, type II   . Ventral hernia   . Unspecified hypothyroidism   . LVH (left ventricular hypertrophy)   . Bilateral renal cysts   . Calculus of left kidney     non-obstructing  . COPD (chronic obstructive pulmonary disease)   . Chronic combined systolic and diastolic CHF (congestive heart failure)     a. Echo 07/2013: EF 40-50%, mod LVH, mild MR, mildly dilated LA, mild-mod TR, PA pressure 84mmHg.  Marland Kitchen RBBB with left anterior fascicular block   . Bradycardia   . Pulmonary hypertension     a. Echo 07/2013: PASP 16mmHg.  . Mild mitral regurgitation     a. By echo 07/2013.  . Tricuspid regurgitation     a. Mild-mod by echo 07/2013.  . Orthostatic hypotension   . Dementia   . CAD (coronary artery disease)     a. Nonobstructive by cath in 2008. b. Nuc 08/2013: low to intermediate risk. Imaging c/w scar in the inferior wall without any large ischemic territories. LVEF 37% with fairly diffuse hypokinesis.   . Syncope   . Ventral hernia   . Pulmonary nodules   . CKD (chronic kidney disease), stage III  a. based on historical lab data.      Past Surgical History  Procedure Laterality Date  . Abdominal surgery      x 3  . Cardiac catheterization  2008       Prior to Admission medications   Medication Sig Start Date End Date Taking? Authorizing Provider  albuterol (PROVENTIL) (2.5 MG/3ML) 0.083% nebulizer solution Take 3 mLs (2.5 mg total) by nebulization every 4 (four) hours as needed for wheezing or shortness of breath. 03/20/14  Yes Kathie Dike, MD  aspirin 81 MG chewable tablet Chew 81 mg by mouth daily.   Yes  Historical Provider, MD  atorvastatin (LIPITOR) 80 MG tablet Take 80 mg by mouth daily.   Yes Historical Provider, MD  azithromycin (ZITHROMAX) 500 MG tablet Take 1 tablet (500 mg total) by mouth daily. 03/20/14  Yes Kathie Dike, MD  enoxaparin (LOVENOX) 100 MG/ML injection Inject 100 mg into the skin every 12 (twelve) hours.   Yes Historical Provider, MD  famotidine (PEPCID) 20 MG tablet Take 1 tablet (20 mg total) by mouth 2 (two) times daily. 12/06/12  Yes Lezlie Octave Black, NP  furosemide (LASIX) 20 MG tablet Take 1 tablet (20 mg total) by mouth daily. 03/20/14  Yes Kathie Dike, MD  levothyroxine (SYNTHROID, LEVOTHROID) 50 MCG tablet Take 50 mcg by mouth daily before breakfast.   Yes Historical Provider, MD  metoprolol tartrate (LOPRESSOR) 25 MG tablet Take 12.5 mg by mouth 2 (two) times daily.   Yes Historical Provider, MD  predniSONE (DELTASONE) 10 MG tablet Take 40mg  po daily for 3 days then 30mg  po daily for 3 days then 20mg  po daily for 3 days then 10mg  po daily for 3 days then stop Patient taking differently: Take 10-40 mg by mouth See admin instructions. Take 40mg  po daily for 3 days then 30mg  po daily for 3 days then 20mg  po daily for 3 days then 10mg  po daily for 3 days then stop 03/20/14  Yes Kathie Dike, MD  rivaroxaban (XARELTO) 20 MG TABS tablet Take 1 tablet (20 mg total) by mouth daily with breakfast. 03/20/14  Yes Kathie Dike, MD  tiotropium (SPIRIVA HANDIHALER) 18 MCG inhalation capsule Place 1 capsule (18 mcg total) into inhaler and inhale daily. 03/20/14  Yes Kathie Dike, MD  vitamin B-12 (CYANOCOBALAMIN) 1000 MCG tablet Take 1,000 mcg by mouth daily.   Yes Historical Provider, MD  acetaminophen (TYLENOL) 325 MG tablet Take 650 mg by mouth every 6 (six) hours as needed for mild pain or fever.    Historical Provider, MD  albuterol (PROVENTIL HFA) 108 (90 BASE) MCG/ACT inhaler Inhale 2 puffs into the lungs every 6 (six) hours as needed for wheezing or shortness of breath.     Historical Provider, MD      Allergies  Allergen Reactions  . Codeine Shortness Of Breath    Shortness of breath  . Penicillins Shortness Of Breath  . Morphine And Related Itching     Social History:  reports that he quit smoking about 41 years ago. His smoking use included Cigarettes. He started smoking about 70 years ago. He smoked 0.50 packs per day. He has quit using smokeless tobacco. His smokeless tobacco use included Chew. He reports that he does not drink alcohol or use illicit drugs.     Family History  Problem Relation Age of Onset  . Hypertension         Physical Exam:  GEN:  Pleasant  Elderly Well Developed  81 y.o. African  American  male  examined  and in no acute distress; cooperative with exam Filed Vitals:   03/23/14 0000 03/23/14 0030 03/23/14 0100 03/23/14 0130  BP: 105/49 105/53 110/44 120/52  Pulse: 63 64 56 54  Temp:      TempSrc:      Resp: 22 27 21 21   Height:      Weight:      SpO2: 96% 95% 99% 100%   Blood pressure 120/52, pulse 54, temperature 98.6 F (37 C), temperature source Oral, resp. rate 21, height 5\' 8"  (1.727 m), weight 81.194 kg (179 lb), SpO2 100 %. PSYCH: He is alert and oriented x 3; does not appear anxious does not appear depressed; affect is normal HEENT: Normocephalic and Atraumatic, Mucous membranes pink; PERRLA; EOM intact; Fundi:  Benign;  No scleral icterus, Nares: Patent, Oropharynx: Clear, Poor Sparse Dentition,    Neck:  FROM, No Cervical Lymphadenopathy nor Thyromegaly or Carotid Bruit; No JVD; Breasts:: Not examined CHEST WALL: No tenderness CHEST: Normal respiration, clear to auscultation bilaterally HEART: Regular rate and rhythm; no murmurs rubs or gallops BACK: No kyphosis or scoliosis; No CVA tenderness ABDOMEN: Positive Bowel Sounds, Soft Non-Tender; No Masses, No Organomegaly Rectal Exam: Not done EXTREMITIES: No Cyanosis, Clubbing, or Edema; No Ulcerations. Genitalia: not examined PULSES: 2+ and  symmetric SKIN: Normal hydration no rash or ulceration CNS:  Alert and Oriented  x 3, No Focal Deficits Vascular: pulses palpable throughout    Labs on Admission:  Basic Metabolic Panel:  Recent Labs Lab 03/16/14 0623 03/17/14 0229 03/18/14 0633 03/19/14 0607 03/19/14 1156 03/22/14 2037  NA 141 137 137 138  --  129*  K 4.5 4.4 4.7 5.0  --  3.7  CL 98 96 94* 96  --  95*  CO2 36* 34* 32 33*  --  28  GLUCOSE 103* 118* 159* 151* 350* 115*  BUN 33* 33* 37* 41*  --  33*  CREATININE 1.14 1.16 1.13 1.18  --  1.21  CALCIUM 9.7 9.5 9.9 10.1  --  8.8   Liver Function Tests:  Recent Labs Lab 03/22/14 2037  AST 26  ALT 25  ALKPHOS 81  BILITOT 0.6  PROT 6.8  ALBUMIN 3.5    Recent Labs Lab 03/22/14 2037  LIPASE 103*   No results for input(s): AMMONIA in the last 168 hours. CBC:  Recent Labs Lab 03/16/14 0623 03/18/14 0633 03/22/14 2036  WBC 7.5 6.9 10.6*  NEUTROABS  --   --  6.5  HGB 11.5* 13.4 12.9*  HCT 35.4* 40.8 38.8*  MCV 103.8* 100.7* 99.0  PLT 168 194 179   Cardiac Enzymes:  Recent Labs Lab 03/16/14 2006 03/16/14 2230 03/17/14 0229 03/17/14 0706 03/22/14 2036  TROPONINI 0.20* 0.20* 0.22* 0.21* 0.30*    BNP (last 3 results)  Recent Labs  07/26/13 1128 11/16/13 0839 01/10/14 1046  PROBNP 2036.0* 770.7* 1366.0*   CBG:  Recent Labs Lab 03/19/14 1707 03/19/14 2043 03/20/14 0713 03/20/14 1138 03/20/14 1700  GLUCAP 131* 168* 119* 169* 267*    Radiological Exams on Admission: Ct Abdomen Pelvis Wo Contrast  03/22/2014   CLINICAL DATA:  Feeling bad for 6-7 weeks, bloody urine beginning yesterday. History of diabetes, kidney stones, ventral hernia, colon and prostate cancer.  EXAM: CT ABDOMEN AND PELVIS WITHOUT CONTRAST  TECHNIQUE: Multidetector CT imaging of the abdomen and pelvis was performed following the standard protocol without IV contrast. Oral contrast administered.  COMPARISON:  CT of the abdomen and pelvis November 20, 2013   FINDINGS: LUNG BASES: Heart size upper limits of normal, no pericardial fluid collections. Lung base scarring, similar to prior examination with superimposed RIGHT lower lobe bronchial wall thickening, small consolidation, axial 7/23. Additional patchy ground-glass opacities in RIGHT lung base.  KIDNEYS/BLADDER: Kidneys are orthotopic, demonstrating normal size and morphology. 2 mm nonobstructing LEFT interpolar nephrolithiasis. No hydronephrosis; limited assessment for renal masses on this nonenhanced examination. LEFT renal cysts measure up to 2.3 cm, stable. The unopacified ureters are normal in course and caliber. Urinary bladder is partially distended and unremarkable.  SOLID ORGANS: The liver, spleen, pancreas and adrenal glands are unremarkable for this non-contrast examination. A few tiny layering gallstones without CT findings of acute cholecystitis.  GASTROINTESTINAL TRACT: The stomach, small and large bowel are normal in course and caliber without inflammatory changes, the sensitivity may be decreased by lack of enteric contrast. Normal appendix.  PERITONEUM/RETROPERITONEUM: No intraperitoneal free fluid nor free air. Aortoiliac vessels are normal in course, moderate to severe calcific atherosclerosis. Mildly ectatic LEFT Common iliac artery, unchanged No lymphadenopathy by CT size criteria. Prostate is present, mildly enlarged. Platelike calcifications are RIGHT retroperitoneum may be postsurgical.  SOFT TISSUES/ OSSEOUS STRUCTURES: Nonsuspicious. Rectus abdominus diastases, wide neck ventral hernia containing fat and small bowel, unchanged. Anterior abdominal wall scarring Nodules in the LEFT anterior abdominal wall subcutaneous fat may reflect injection granulomas. Small bilateral fat containing inguinal hernias. Somewhat fatty paraspinal muscles, fatty pelvic girdle muscles can be seen with remote infectious process such is polio though is nonspecific. Grade 1 L4-5 anterolisthesis on degenerative  basis, severe facet arthropathy resulting in severe L4-5 and L5-S1 neural foraminal narrowing.  IMPRESSION: Included view of the chest demonstrates RIGHT lower lobe small consolidation, bronchial wall thickening patchy ground-glass opacities concerning for pneumonia. Recommend follow-up.  No acute intra-abdominal or pelvic process. 2 mm nonobstructing LEFT interpolar nephrolithiasis.  Wide neck ventral hernia without CT findings of bowel obstruction or strangulation.   Electronically Signed   By: Elon Alas   On: 03/22/2014 23:51   Dg Chest 1 View  03/22/2014   CLINICAL DATA:  Hematuria and weakness  EXAM: CHEST - 1 VIEW  COMPARISON:  March 16, 2014  FINDINGS: The previously noted nodular opacity in the right mid lung region is no longer appreciable. There is underlying emphysematous change with areas of scarring bilaterally. There is no frank edema or consolidation. Heart is normal in size with pulmonary vascularity within normal limits. There is atherosclerotic change in the aorta. No adenopathy.  IMPRESSION: Underlying emphysematous change. Areas of mild scarring. No edema or consolidation.   Electronically Signed   By: Lowella Grip M.D.   On: 03/22/2014 21:14     EKG: Independently reviewed.    Assessment/Plan:   81 y.o. male with  Principal Problem:   1.   HCAP (healthcare-associated pneumonia)   IV Vancomycin and Aztreonam   Duonebs   O2 PRN   Monitor O2 Sats   Active Problems:   2.   HTN (hypertension)   Continue      3.   Chronic anticoagulation   Continue on Xarelto   Discontinued Lovenox Rx     4.   Type 2 diabetes mellitus   Diet Controlled   SSI coverage ordered   Check HbA1C     5.   Hypothyroidism   Continue Levothyropxine Rx        6.   Calculus of left kidney   Non-Obstructing on ABD CT scan     7.  Chronic diastolic congestive heart failure   Continue Lasix, and Metoprolol Rx   Monitor for Signs of Decompensation     8.   Chronic atrial  fibrillation   On Xarelto Rx     9.   CAD (coronary artery disease)   On Metoprolol, ASA, and Atorvastatin Rx    10.  Hematuria   Discontinue Lovenox   Monitor for continued Hematuria    11.  Hyponatremia   IVFs with NSS   Hold Lasix    Monitor Sodium Trend    12.  COPD (chronic obstructive pulmonary disease)   DuoNebs   O2 PRN    13.  DVT Prophylaxis    On Xarelto Rx        Code Status:    DO NOT RESUSCITATE (DNR) Family Communication:    Wife and Daughter at Bedside Disposition Plan:         Time spent:  54 [place] [address] [facility] Pager [phone]   If [place] Please Contact the Day Rounding Team MD for [facility]  If 7PM-7AM, Please Contact Night-Floor Coverage  [email or web address] Password [email or web address] 03/23/2014, 1:59 AM

## 2014-03-23 NOTE — Discharge Summary (Deleted)
[facility] Progress Note   Bryce Mcdonald HDQ:[id number] DOB: 07-13-1921 DOA: 03/22/2014 PCP: PROVIDER NOT IN SYSTEM  Brief narrative: Bryce Mcdonald is a 81 y.o. male with a history of COPD, CAD, HTN, Chronic Atrial fibrillation, Chronic combined CHF, DM2,CKD Stage III, and Remote hx of Colon Cancer who was brogut to the ED due to gross hematuria x 1 day. he also complained of a cough and fevers. He was found to have pneumonia.    Subjective: Cough is slightly better. No other complaints.   Assessment/Plan:  HCAP (healthcare-associated pneumonia) cont IV Vancomycin and Aztreonam Duonebs O2 PRN    Active Problems:   Hematuria resolved- cont Xarelto  Type 2 diabetes mellitus Diet Controlled SSI coverage ordered Check HbA1C   Hypothyroidism Continue Levothyropxine Rx    Calculus of left kidney and ventral hernia Non-Obstructing calculus on ABD CT scan- ventral hernia not strangulated   Chronic diastolic congestive heart failure hold Lasix due to hyponatremia- cont Metoprolol Rx Monitor for Signs of Decompensation   Chronic atrial fibrillation On Xarelto Rx   CAD (coronary artery disease) On Metoprolol, ASA, and Atorvastatin Rx   Hyponatremia Resolved with NS Hold Lasix for today- resume tomorrow    COPD (chronic obstructive pulmonary disease)                         No exacerbtion DuoNebs O2 PRN    Code Status: DNR Family Communication:  Disposition Plan: home when stable DVT  prophylaxis: Xarelto  Consultants:  Procedures:   Antibiotics: Anti-infectives    Start     Dose/Rate Route Frequency Ordered Stop   03/23/14 1800  vancomycin (VANCOCIN) IVPB 1000 mg/200 mL premix     1,000 mg200 mL/hr over 60 Minutes Intravenous Every 24 hours 03/23/14 0903     03/23/14 1000  aztreonam (AZACTAM) 1 g in dextrose 5 % 50 mL IVPB     1 g100 mL/hr over 30 Minutes Intravenous Every 8 hours 03/23/14 0749     03/23/14 0045  vancomycin (VANCOCIN) 1,250 mg in sodium chloride 0.9 % 250 mL IVPB     1,250 mg166.7 mL/hr over 90 Minutes Intravenous  Once 03/23/14 0033 03/23/14 0215   03/23/14 0015  aztreonam (AZACTAM) 2 g in dextrose 5 % 50 mL IVPB     2 g100 mL/hr over 30 Minutes Intravenous  Once 03/23/14 0004 03/23/14 0102         Objective: Filed Weights   03/22/14 1959 03/23/14 0218  Weight: 81.194 kg (179 lb) 78 kg (171 lb 15.3 oz)    Intake/Output Summary (Last 24 hours) at 03/23/14 1144 Last data filed at 03/23/14 1136  Gross per 24 hour  Intake      3 ml  Output    900 ml  Net   -897 ml     Vitals Filed Vitals:   03/23/14 0314 03/23/14 0540 03/23/14 0751 03/23/14 0900  BP:  121/67  126/64  Pulse:  58  72  Temp:  97.8 F (36.6 C)  98.9 F (37.2 C)  TempSrc:  Oral  Temporal  Resp:  20    Height:      Weight:      SpO2: 96% 100% 95% 93%    Exam: General: AAO x3, No acute respiratory distress Lungs: Clear to auscultation bilaterally without wheezes or crackles Cardiovascular: Regular rate and rhythm without murmur gallop or rub normal S1 and S2 Abdomen: Nontender, nondistended, soft, bowel sounds positive, no rebound, no ascites,  no appreciable mass Extremities: No significant cyanosis, clubbing, or edema bilateral lower extremities  Data Reviewed: Basic Metabolic Panel:  Recent Labs Lab 03/17/14 0229 03/18/14 0633 03/19/14 0607 03/19/14 1156 03/22/14 2037 03/23/14 0523  NA 137 137 138  --  129* 139  K 4.4 4.7 5.0  --  3.7 3.9  CL 96  94* 96  --  95* 99  CO2 34* 32 33*  --  28 31  GLUCOSE 118* 159* 151* 350* 115* 102*  BUN 33* 37* 41*  --  33* 29*  CREATININE 1.16 1.13 1.18  --  1.21 1.18  CALCIUM 9.5 9.9 10.1  --  8.8 9.1   Liver Function Tests:  Recent Labs Lab 03/22/14 2037  AST 26  ALT 25  ALKPHOS 81  BILITOT 0.6  PROT 6.8  ALBUMIN 3.5    Recent Labs Lab 03/22/14 2037  LIPASE 103*   No results for input(s): AMMONIA in the last 168 hours. CBC:  Recent Labs Lab 03/18/14 0633 03/22/14 2036 03/23/14 0523  WBC 6.9 10.6* 9.3  NEUTROABS  --  6.5  --   HGB 13.4 12.9* 12.0*  HCT 40.8 38.8* 37.2*  MCV 100.7* 99.0 100.3*  PLT 194 179 164   Cardiac Enzymes:  Recent Labs Lab 03/16/14 2006 03/16/14 2230 03/17/14 0229 03/17/14 0706 03/22/14 2036  TROPONINI 0.20* 0.20* 0.22* 0.21* 0.30*   BNP (last 3 results)  Recent Labs  07/26/13 1128 11/16/13 0839 01/10/14 1046  PROBNP 2036.0* 770.7* 1366.0*   CBG:  Recent Labs Lab 03/19/14 1707 03/19/14 2043 03/20/14 0713 03/20/14 1138 03/20/14 1700  GLUCAP 131* 168* 119* 169* 267*    Recent Results (from the past 240 hour(s))  Clostridium Difficile by PCR     Status: None   Collection Time: 03/16/14 10:34 AM  Result Value Ref Range Status   C difficile by pcr NEGATIVE NEGATIVE Final     Studies:  Recent x-ray studies have been reviewed in detail by the Attending Physician  Scheduled Meds:  Scheduled Meds: . sodium chloride   Intravenous STAT  . acetaminophen  650 mg Oral Once  . aspirin  81 mg Oral Daily  . atorvastatin  80 mg Oral Daily  . aztreonam  1 g Intravenous Q8H  . famotidine  20 mg Oral BID  . ipratropium-albuterol  3 mL Nebulization Q4H  . levothyroxine  50 mcg Oral QAC breakfast  . metoprolol tartrate  12.5 mg Oral BID  . rivaroxaban  20 mg Oral Q breakfast  . sodium chloride  3 mL Intravenous Q12H  . vancomycin  1,000 mg Intravenous Q24H  . vitamin B-12  1,000 mcg Oral Daily   Continuous Infusions:   Time  spent on care of this patient:35 min   [place], MD 03/23/2014, 11:44 AM  LOS: 1 day   [facility] Office  [phone] Pager - Text Page per [email or web address]  If 7PM-7AM, please contact night-coverage [email or web address]

## 2014-03-23 NOTE — Care Management Note (Addendum)
    Page 1 of 2   03/25/2014     2:20:13 PM CARE MANAGEMENT NOTE 03/25/2014  Patient:  Bryce Mcdonald, Bryce Mcdonald   Account Number:  [id number]  Date Initiated:  03/23/2014  Documentation initiated by:  CHILDRESS,JESSICA  Subjective/Objective Assessment:   Pt admitted with HCAP. Pt recently discharged home with [place], home O2 and a neb machine. Plan was for pt to be cared for by daughters. Pt's son in room at time of assessment. Pt's son interested in completing a legal will for the p     Action/Plan:   Instructed family to see a lawyer to complete a legal will. [place], [facility] and Dr. Charisse March office notified of admission. Pt plans to discharge home with resumption of [facility] services, Will cont to follow for CM needs.   Anticipated DC Date:  03/23/2014   Anticipated DC Plan:  [place]  CM consult      [facility] Choice  Resumption Of Svcs/PTA [place] [facility]   Choice offered to / List presented to:          [facility] arranged  HH-1 RN      [place]   Status of service:  Completed, signed off Medicare Important Message given?  YES (If response is "NO", the following Medicare IM given date fields will be blank) Date Medicare IM given:  03/25/2014 Medicare IM given by:  Jolene Provost Date Additional Medicare IM given:   Additional Medicare IM given by:    Discharge Disposition:  [place]  Per UR Regulation:  Reviewed for med. necessity/level of care/duration of stay  If discussed at [place] of Stay Meetings, dates discussed:    Comments:  03/25/2014 [place], RN, MSN, CM Pt to be discharged home today. Pt will have [place] services through [facility]. [place] with Myriam Jacobson at [facility] and faxed orders. Dr. Berline Lopes [facility]) has been notifed of discharge. Per Caryl Pina (pt's SW) at [facility], [facility] services have been approved and there will be no delay in making first [facility] visit. Will fax D.C summary to both  [place] and pt's PCP (Dr. Berline Lopes) at the [facility] once it is available. Pt has home O2 and neb machine from previous admitt. Attempted to call pt's daughter Andee Poles but she has not returned call. Pt has no further CM needs at this time.  03/24/2014 [place], RN, MSN, CM Spoke with [place] (ext. [phone]) at the [facility]. Dr. Joneen Caraway is pt's PCP and has placed order for [facility] services (giving [facility] home health approval to make visit). Pt's H&P and previous discharge summary re-faxed to [facility].  03/23/2014 [place], RN, MSN, CM

## 2014-03-23 NOTE — Progress Notes (Signed)
ANTIBIOTIC CONSULT NOTE-Preliminary  Pharmacy Consult for Vancomycin Indication: Pneumonia  Allergies  Allergen Reactions  . Codeine Shortness Of Breath    Shortness of breath  . Penicillins Shortness Of Breath  . Morphine And Related Itching    Patient Measurements: Height: 5\' 8"  (172.7 cm) Weight: 179 lb (81.194 kg) IBW/kg (Calculated) : 68.4  Vital Signs: Temp: 98.6 F (37 C) (01/31 1959) Temp Source: Oral (01/31 1959) BP: 101/60 mmHg (01/31 2346) Pulse Rate: 65 (01/31 2346)  Labs:  Recent Labs  03/22/14 2036 03/22/14 2037  WBC 10.6*  --   HGB 12.9*  --   PLT 179  --   CREATININE  --  1.21    Estimated Creatinine Clearance: 39.3 mL/min (by C-G formula based on Cr of 1.21).  No results for input(s): VANCOTROUGH, VANCOPEAK, VANCORANDOM, GENTTROUGH, GENTPEAK, GENTRANDOM, TOBRATROUGH, TOBRAPEAK, TOBRARND, AMIKACINPEAK, AMIKACINTROU, AMIKACIN in the last 72 hours.   Microbiology: Recent Results (from the past 720 hour(s))  Clostridium Difficile by PCR     Status: None   Collection Time: 03/16/14 10:34 AM  Result Value Ref Range Status   C difficile by pcr NEGATIVE NEGATIVE Final    Medical History: Past Medical History  Diagnosis Date  . Colon cancer     Status post partial colectomy  . Hypertension   . Hypercholesterolemia   . Blood transfusion   . DVT (deep venous thrombosis)     a. Prior h/o such. b. Recurrent subacute bilat DVT 2014.  . PE (pulmonary embolism)   . Diabetes mellitus   . Chronic atrial fibrillation   . Diabetes mellitus, type II   . Ventral hernia   . Unspecified hypothyroidism   . LVH (left ventricular hypertrophy)   . Bilateral renal cysts   . Calculus of left kidney     non-obstructing  . COPD (chronic obstructive pulmonary disease)   . Chronic combined systolic and diastolic CHF (congestive heart failure)     a. Echo 07/2013: EF 40-50%, mod LVH, mild MR, mildly dilated LA, mild-mod TR, PA pressure 37mmHg.  Marland Kitchen RBBB with left  anterior fascicular block   . Bradycardia   . Pulmonary hypertension     a. Echo 07/2013: PASP 85mmHg.  . Mild mitral regurgitation     a. By echo 07/2013.  . Tricuspid regurgitation     a. Mild-mod by echo 07/2013.  . Orthostatic hypotension   . Dementia   . CAD (coronary artery disease)     a. Nonobstructive by cath in 2008. b. Nuc 08/2013: low to intermediate risk. Imaging c/w scar in the inferior wall without any large ischemic territories. LVEF 37% with fairly diffuse hypokinesis.   . Syncope   . Ventral hernia   . Pulmonary nodules   . CKD (chronic kidney disease), stage III     a. based on historical lab data.    Medications:  Aztreonam 2 Gm IV x 1 dose in the ED  Assessment: 81 yo male with complex medical history s/p recent hospital discharge 03/20/14 and tx for PE and COPD exacerbation. Now with complaints of bloody urine. WBC sl elevated/ Pt afebrile. Urine cultures pending.  Goal of Therapy:  Eradicate infection  Plan:  Preliminary review of pertinent patient information completed.  Protocol will be initiated with a one-time dose of Vancomycin 1250 mg IV.  Forestine Na clinical pharmacist will complete review during morning rounds to assess patient and finalize treatment regimen.  Norberto Sorenson, [facility] 03/23/2014,12:34 AM

## 2014-03-23 NOTE — Progress Notes (Signed)
ANTIBIOTIC CONSULT NOTE - INITIAL  Pharmacy Consult for Vancomycin and Aztreonam Indication: pneumonia  Allergies  Allergen Reactions  . Codeine Shortness Of Breath    Shortness of breath  . Penicillins Shortness Of Breath  . Morphine And Related Itching   Patient Measurements: Height: 5\' 8"  (172.7 cm) Weight: 171 lb 15.3 oz (78 kg) IBW/kg (Calculated) : 68.4  Vital Signs: Temp: 97.8 F (36.6 C) (02/01 0540) Temp Source: Oral (02/01 0540) BP: 121/67 mmHg (02/01 0540) Pulse Rate: 58 (02/01 0540) Intake/Output from previous day:   Intake/Output from this shift: Total I/O In: -  Out: 900 [Urine:900]  Labs:  Recent Labs  03/22/14 2036 03/22/14 2037 03/23/14 0523  WBC 10.6*  --  9.3  HGB 12.9*  --  12.0*  PLT 179  --  164  CREATININE  --  1.21 1.18   Estimated Creatinine Clearance: 40.3 mL/min (by C-G formula based on Cr of 1.18). No results for input(s): VANCOTROUGH, VANCOPEAK, VANCORANDOM, GENTTROUGH, GENTPEAK, GENTRANDOM, TOBRATROUGH, TOBRAPEAK, TOBRARND, AMIKACINPEAK, AMIKACINTROU, AMIKACIN in the last 72 hours.   Microbiology: Recent Results (from the past 720 hour(s))  Clostridium Difficile by PCR     Status: None   Collection Time: 03/16/14 10:34 AM  Result Value Ref Range Status   C difficile by pcr NEGATIVE NEGATIVE Final    Medical History: Past Medical History  Diagnosis Date  . Colon cancer     Status post partial colectomy  . Hypertension   . Hypercholesterolemia   . Blood transfusion   . DVT (deep venous thrombosis)     a. Prior h/o such. b. Recurrent subacute bilat DVT 2014.  . PE (pulmonary embolism)   . Diabetes mellitus   . Chronic atrial fibrillation   . Diabetes mellitus, type II   . Ventral hernia   . Unspecified hypothyroidism   . LVH (left ventricular hypertrophy)   . Bilateral renal cysts   . Calculus of left kidney     non-obstructing  . COPD (chronic obstructive pulmonary disease)   . Chronic combined systolic and  diastolic CHF (congestive heart failure)     a. Echo 07/2013: EF 40-50%, mod LVH, mild MR, mildly dilated LA, mild-mod TR, PA pressure 10mmHg.  Marland Kitchen RBBB with left anterior fascicular block   . Bradycardia   . Pulmonary hypertension     a. Echo 07/2013: PASP 48mmHg.  . Mild mitral regurgitation     a. By echo 07/2013.  . Tricuspid regurgitation     a. Mild-mod by echo 07/2013.  . Orthostatic hypotension   . Dementia   . CAD (coronary artery disease)     a. Nonobstructive by cath in 2008. b. Nuc 08/2013: low to intermediate risk. Imaging c/w scar in the inferior wall without any large ischemic territories. LVEF 37% with fairly diffuse hypokinesis.   . Syncope   . Ventral hernia   . Pulmonary nodules   . CKD (chronic kidney disease), stage III     a. based on historical lab data.   Anti-infectives    Start     Dose/Rate Route Frequency Ordered Stop   03/23/14 1800  vancomycin (VANCOCIN) IVPB 1000 mg/200 mL premix     1,000 mg200 mL/hr over 60 Minutes Intravenous Every 24 hours 03/23/14 0903     03/23/14 1000  aztreonam (AZACTAM) 1 g in dextrose 5 % 50 mL IVPB     1 g100 mL/hr over 30 Minutes Intravenous Every 8 hours 03/23/14 0749     03/23/14 0045  vancomycin (VANCOCIN) 1,250 mg in sodium chloride 0.9 % 250 mL IVPB     1,250 mg166.7 mL/hr over 90 Minutes Intravenous  Once 03/23/14 0033 03/23/14 0215   03/23/14 0015  aztreonam (AZACTAM) 2 g in dextrose 5 % 50 mL IVPB     2 g100 mL/hr over 30 Minutes Intravenous  Once 03/23/14 0004 03/23/14 0102     Assessment: 81yo male with h/o COPD.  Asked to initiate Vancomycin and Aztreonam for pneumonia. Estimated Creatinine Clearance: 40.3 mL/min (by C-G formula based on Cr of 1.18).  Goal of Therapy:  Vancomycin trough level 15-20 mcg/ml  Plan:  Vancomycin 1000mg  IV q24hrs Check trough at steady state Aztreonam 1gm IV q8h Monitor labs, renal fxn, and cultures  Hart Robinsons A 03/23/2014,9:06 AM

## 2014-03-23 NOTE — Care Management Utilization Note (Signed)
UR completed 

## 2014-03-24 DIAGNOSIS — N390 Urinary tract infection, site not specified: Secondary | ICD-10-CM

## 2014-03-24 DIAGNOSIS — Z7901 Long term (current) use of anticoagulants: Secondary | ICD-10-CM

## 2014-03-24 NOTE — Progress Notes (Signed)
[facility] Progress Note   Bryce Mcdonald BSW:[id number] DOB: 16-Jul-1921 DOA: 03/22/2014 PCP: PROVIDER NOT IN SYSTEM  Brief narrative: Bryce Mcdonald is a 81 y.o. male with a history of COPD, CAD, HTN, Chronic Atrial fibrillation, Chronic combined CHF, DM2,CKD Stage III, and Remote hx of Colon Cancer who was brogut to the ED due to gross hematuria x 1 day. he also complained of a cough and fevers. He was found to have pneumonia.    Subjective: Cough is slightly better. He had burning micturition but this has now resolved. No other complaints.   Assessment/Plan:  HCAP (healthcare-associated pneumonia) cont IV Vancomycin and Aztreonam Duonebs O2 PRN    Active Problems:  UTI - proteus > 100,000 colonies- f/u on culture  Hematuria resolved- cont Xarelto  Type 2 diabetes mellitus Diet Controlled SSI coverage ordered HbA1C 6.8   Hypothyroidism Continue Levothyroxine Rx    Calculus of left kidney and ventral hernia Non-Obstructing calculus on ABD CT scan- ventral hernia not strangulated   Chronic diastolic congestive heart failure hold Lasix due to hyponatremia- cont Metoprolol Rx Monitor for Signs of Decompensation   Chronic atrial fibrillation On Xarelto Rx   CAD (coronary artery disease) On Metoprolol, ASA, and Atorvastatin Rx   Hyponatremia Resolved with NS Held Lasix - will resume now   COPD (chronic obstructive pulmonary disease)  No exacerbtion DuoNebs O2  PRN    Code Status: DNR Family Communication:  Disposition Plan: home when stable DVT prophylaxis: Xarelto  Consultants:  Procedures:   Antibiotics: Anti-infectives    Start   Dose/Rate Route Frequency Ordered Stop   03/23/14 1800  vancomycin (VANCOCIN) IVPB 1000 mg/200 mL premix    1,000 mg200 mL/hr over 60 Minutes Intravenous Every 24 hours 03/23/14 0903    03/23/14 1000  aztreonam (AZACTAM) 1 g in dextrose 5 % 50 mL IVPB    1 g100 mL/hr over 30 Minutes Intravenous Every 8 hours 03/23/14 0749    03/23/14 0045  vancomycin (VANCOCIN) 1,250 mg in sodium chloride 0.9 % 250 mL IVPB    1,250 mg166.7 mL/hr over 90 Minutes Intravenous Once 03/23/14 0033 03/23/14 0215   03/23/14 0015  aztreonam (AZACTAM) 2 g in dextrose 5 % 50 mL IVPB    2 g100 mL/hr over 30 Minutes Intravenous Once 03/23/14 0004 03/23/14 0102         Objective: Filed Weights   03/22/14 1959 03/23/14 0218  Weight: 81.194 kg (179 lb) 78 kg (171 lb 15.3 oz)    Intake/Output Summary (Last 24 hours) at 03/23/14 1144 Last data filed at 03/23/14 1136  Gross per 24 hour  Intake  3 ml  Output  900 ml  Net  -897 ml     Vitals Filed Vitals:   03/23/14 0314 03/23/14 0540 03/23/14 0751 03/23/14 0900  BP:  121/67  126/64  Pulse:  58  72  Temp:  97.8 F (36.6 C)  98.9 F (37.2 C)  TempSrc:  Oral  Temporal  Resp:  20    Height:      Weight:      SpO2: 96% 100% 95% 93%    Exam: General: AAO x3, No acute respiratory distress Lungs: Clear to auscultation bilaterally without wheezes or crackles Cardiovascular: Regular rate and rhythm without murmur gallop or rub normal S1 and S2 Abdomen: Nontender, nondistended, soft, bowel sounds positive, no rebound, no ascites, no appreciable mass Extremities: No significant cyanosis, clubbing, or edema bilateral lower extremities  Data  Reviewed: Basic Metabolic Panel:  Last Labs  Recent Labs Lab 03/17/14 0229 03/18/14 0633 03/19/14 0607 03/19/14 1156 03/22/14 2037 03/23/14 0523  NA 137 137 138 --  129* 139  K 4.4 4.7 5.0 --  3.7 3.9  CL 96 94* 96 --  95* 99  CO2 34* 32 33* --  28 31  GLUCOSE 118* 159* 151* 350* 115* 102*  BUN 33* 37* 41* --  33* 29*  CREATININE 1.16 1.13 1.18 --  1.21 1.18  CALCIUM 9.5 9.9 10.1 --  8.8 9.1     Liver Function Tests:  Last Labs      Recent Labs Lab 03/22/14 2037  AST 26  ALT 25  ALKPHOS 81  BILITOT 0.6  PROT 6.8  ALBUMIN 3.5      Last Labs      Recent Labs Lab 03/22/14 2037  LIPASE 103*      Last Labs     No results for input(s): AMMONIA in the last 168 hours.   CBC:  Last Labs      Recent Labs Lab 03/18/14 0633 03/22/14 2036 03/23/14 0523  WBC 6.9 10.6* 9.3  NEUTROABS --  6.5 --   HGB 13.4 12.9* 12.0*  HCT 40.8 38.8* 37.2*  MCV 100.7* 99.0 100.3*  PLT 194 179 164     Cardiac Enzymes:  Last Labs      Recent Labs Lab 03/16/14 2006 03/16/14 2230 03/17/14 0229 03/17/14 0706 03/22/14 2036  TROPONINI 0.20* 0.20* 0.22* 0.21* 0.30*     BNP (last 3 results)  Recent Labs (within last 365 days)     Recent Labs  07/26/13 1128 11/16/13 0839 01/10/14 1046  PROBNP 2036.0* 770.7* 1366.0*     CBG:  Last Labs      Recent Labs Lab 03/19/14 1707 03/19/14 2043 03/20/14 0713 03/20/14 1138 03/20/14 1700  GLUCAP 131* 168* 119* 169* 267*      Recent Results (from the past 240 hour(s))  Clostridium Difficile by PCR Status: None   Collection Time: 03/16/14 10:34 AM  Result Value Ref Range Status   C difficile by pcr NEGATIVE NEGATIVE Final     Studies:  Recent x-ray studies have been reviewed in detail by the Attending Physician  Scheduled Meds:  Scheduled  Meds: . sodium chloride  Intravenous STAT  . acetaminophen 650 mg Oral Once  . aspirin 81 mg Oral Daily  . atorvastatin 80 mg Oral Daily  . aztreonam 1 g Intravenous Q8H  . famotidine 20 mg Oral BID  . ipratropium-albuterol 3 mL Nebulization Q4H  . levothyroxine 50 mcg Oral QAC breakfast  . metoprolol tartrate 12.5 mg Oral BID  . rivaroxaban 20 mg Oral Q breakfast  . sodium chloride 3 mL Intravenous Q12H  . vancomycin 1,000 mg Intravenous Q24H  . vitamin B-12 1,000 mcg Oral Daily   Continuous Infusions:   Time spent on care of this patient:35 min   [place], MD 03/23/2014, 11:44 AM  LOS: 1 day   [facility] Office [phone] Pager - Text Page per [email or web address]  If 7PM-7AM, please contact night-coverage [email or web address]            Routing History     Date/Time From To Method   03/23/2014 11:50 AM Debbe Odea, MD Provider Not In System In Basket

## 2014-03-25 LAB — URINE CULTURE

## 2014-03-25 MED ORDER — LEVOFLOXACIN 750 MG PO TABS: 750.0000 mg | ORAL_TABLET | Freq: Every day | ORAL | Status: AC

## 2014-03-25 MED ORDER — IPRATROPIUM-ALBUTEROL 0.5-2.5 (3) MG/3ML IN SOLN: 3.0000 mL | Freq: Four times a day (QID) | RESPIRATORY_TRACT | Status: AC | PRN

## 2014-03-25 NOTE — Discharge Summary (Signed)
Physician Discharge Summary  Bryce Mcdonald EUM:[id number] DOB: 1921-12-08 DOA: 03/22/2014  PCP: PROVIDER NOT IN SYSTEM  Admit date: 03/22/2014 Discharge date: 03/25/2014  Time spent: 30 minutes  Recommendations for Outpatient Follow-up:  1. Follow up with PCP in one week.   Discharge Diagnoses:  Principal Problem:   HCAP (healthcare-associated pneumonia) Active Problems:   HTN (hypertension)   Chronic anticoagulation   Type 2 diabetes mellitus   Hypothyroidism   Calculus of left kidney   Chronic diastolic congestive heart failure   Chronic atrial fibrillation   CAD (coronary artery disease)   Hematuria   Hyponatremia   COPD (chronic obstructive pulmonary disease)   Discharge Condition: improved.   Diet recommendation: low sodium diet  Filed Weights   03/22/14 1959 03/23/14 0218  Weight: 81.194 kg (179 lb) 78 kg (171 lb 15.3 oz)    History of present illness:  Bryce Mcdonald is a 81 y.o. male with a history of COPD, CAD, HTN, Chronic Atrial fibrillation, Chronic combined CHF, DM2,CKD Stage III, and Remote hx of Colon Cancer who was brogut to the ED due to gross hematuria x 1 day. he also complained of a cough and fevers. He was found to have pneumonia.    Hospital Course:   HCAP (healthcare-associated pneumonia) cont IV Vancomycin and Aztreonam Duonebs O2 PRN    Active Problems:  UTI - proteus > 100,000 colonies-. Discharged on oral levaquin to complete the course.   Hematuria resolved- cont Xarelto  Type 2 diabetes mellitus Diet Controlled SSI coverage ordered HbA1C 6.8   Hypothyroidism Continue Levothyroxine Rx    Calculus of left kidney and ventral hernia Non-Obstructing calculus on ABD  CT scan- ventral hernia not strangulated   Chronic diastolic congestive heart failure Compensated.   cont Metoprolol Rx Monitor for Signs of Decompensation   Chronic atrial fibrillation On Xarelto Rx   CAD (coronary artery disease) On Metoprolol, ASA, and Atorvastatin Rx   Hyponatremia Resolved with NS Held Lasix - will resume now   COPD (chronic obstructive pulmonary disease)  No exacerbtion DuoNebs O2 PRN Procedures:  none  Consultations:  Discharge Exam: Filed Vitals:   03/25/14 0540  BP: 106/58  Pulse: 73  Temp: 98.2 F (36.8 C)  Resp: 20    General: alert afebrile comfortable Cardiovascular: s1s2 Respiratory: ctab  Discharge Instructions   Discharge Instructions    Diet - low sodium heart healthy    Complete by:  As directed      Discharge instructions    Complete by:  As directed   FOLLOW UP WITH PCP in one week          Current Discharge Medication List    START taking these medications   Details  ipratropium-albuterol (DUONEB) 0.5-2.5 (3) MG/3ML SOLN Take 3 mLs by nebulization every 6 (six) hours as needed. Qty: 360 mL, Refills: 1    levofloxacin (LEVAQUIN) 750 MG tablet Take 1 tablet (750 mg total) by mouth daily. Qty: 4 tablet, Refills: 0      CONTINUE these medications which have NOT CHANGED   Details  aspirin 81 MG chewable tablet Chew 81 mg by mouth daily.    atorvastatin (LIPITOR) 80 MG tablet Take 80 mg by mouth daily.    famotidine (PEPCID) 20 MG tablet Take 1 tablet (20 mg total) by mouth 2 (two) times daily.    furosemide (LASIX) 20 MG tablet Take 1 tablet (20 mg total) by mouth daily. Qty: 90 tablet, Refills: 3   Associated Diagnoses: Diarrhea  levothyroxine (SYNTHROID, LEVOTHROID) 50 MCG tablet  Take 50 mcg by mouth daily before breakfast.    metoprolol tartrate (LOPRESSOR) 25 MG tablet Take 12.5 mg by mouth 2 (two) times daily.    rivaroxaban (XARELTO) 20 MG TABS tablet Take 1 tablet (20 mg total) by mouth daily with breakfast. Qty: 30 tablet, Refills: 1    tiotropium (SPIRIVA HANDIHALER) 18 MCG inhalation capsule Place 1 capsule (18 mcg total) into inhaler and inhale daily. Qty: 30 capsule, Refills: 12    vitamin B-12 (CYANOCOBALAMIN) 1000 MCG tablet Take 1,000 mcg by mouth daily.    acetaminophen (TYLENOL) 325 MG tablet Take 650 mg by mouth every 6 (six) hours as needed for mild pain or fever.    albuterol (PROVENTIL HFA) 108 (90 BASE) MCG/ACT inhaler Inhale 2 puffs into the lungs every 6 (six) hours as needed for wheezing or shortness of breath.      STOP taking these medications     albuterol (PROVENTIL) (2.5 MG/3ML) 0.083% nebulizer solution      azithromycin (ZITHROMAX) 500 MG tablet      predniSONE (DELTASONE) 10 MG tablet        Allergies  Allergen Reactions  . Codeine Shortness Of Breath    Shortness of breath  . Penicillins Shortness Of Breath  . Morphine And Related Itching      The results of significant diagnostics from this hospitalization (including imaging, microbiology, ancillary and laboratory) are listed below for reference.    Significant Diagnostic Studies: Ct Abdomen Pelvis Wo Contrast  03/22/2014   CLINICAL DATA:  Feeling bad for 6-7 weeks, bloody urine beginning yesterday. History of diabetes, kidney stones, ventral hernia, colon and prostate cancer.  EXAM: CT ABDOMEN AND PELVIS WITHOUT CONTRAST  TECHNIQUE: Multidetector CT imaging of the abdomen and pelvis was performed following the standard protocol without IV contrast. Oral contrast administered.  COMPARISON:  CT of the abdomen and pelvis November 20, 2013  FINDINGS: LUNG BASES: Heart size upper limits of normal, no pericardial fluid collections. Lung base scarring, similar to prior  examination with superimposed RIGHT lower lobe bronchial wall thickening, small consolidation, axial 7/23. Additional patchy ground-glass opacities in RIGHT lung base.  KIDNEYS/BLADDER: Kidneys are orthotopic, demonstrating normal size and morphology. 2 mm nonobstructing LEFT interpolar nephrolithiasis. No hydronephrosis; limited assessment for renal masses on this nonenhanced examination. LEFT renal cysts measure up to 2.3 cm, stable. The unopacified ureters are normal in course and caliber. Urinary bladder is partially distended and unremarkable.  SOLID ORGANS: The liver, spleen, pancreas and adrenal glands are unremarkable for this non-contrast examination. A few tiny layering gallstones without CT findings of acute cholecystitis.  GASTROINTESTINAL TRACT: The stomach, small and large bowel are normal in course and caliber without inflammatory changes, the sensitivity may be decreased by lack of enteric contrast. Normal appendix.  PERITONEUM/RETROPERITONEUM: No intraperitoneal free fluid nor free air. Aortoiliac vessels are normal in course, moderate to severe calcific atherosclerosis. Mildly ectatic LEFT Common iliac artery, unchanged No lymphadenopathy by CT size criteria. Prostate is present, mildly enlarged. Platelike calcifications are RIGHT retroperitoneum may be postsurgical.  SOFT TISSUES/ OSSEOUS STRUCTURES: Nonsuspicious. Rectus abdominus diastases, wide neck ventral hernia containing fat and small bowel, unchanged. Anterior abdominal wall scarring Nodules in the LEFT anterior abdominal wall subcutaneous fat may reflect injection granulomas. Small bilateral fat containing inguinal hernias. Somewhat fatty paraspinal muscles, fatty pelvic girdle muscles can be seen with remote infectious process such is polio though is nonspecific. Grade 1 L4-5 anterolisthesis on degenerative basis, severe facet  arthropathy resulting in severe L4-5 and L5-S1 neural foraminal narrowing.  IMPRESSION: Included view of the  chest demonstrates RIGHT lower lobe small consolidation, bronchial wall thickening patchy ground-glass opacities concerning for pneumonia. Recommend follow-up.  No acute intra-abdominal or pelvic process. 2 mm nonobstructing LEFT interpolar nephrolithiasis.  Wide neck ventral hernia without CT findings of bowel obstruction or strangulation.   Electronically Signed   By: Elon Alas   On: 03/22/2014 23:51   Dg Chest 1 View  03/22/2014   CLINICAL DATA:  Hematuria and weakness  EXAM: CHEST - 1 VIEW  COMPARISON:  March 16, 2014  FINDINGS: The previously noted nodular opacity in the right mid lung region is no longer appreciable. There is underlying emphysematous change with areas of scarring bilaterally. There is no frank edema or consolidation. Heart is normal in size with pulmonary vascularity within normal limits. There is atherosclerotic change in the aorta. No adenopathy.  IMPRESSION: Underlying emphysematous change. Areas of mild scarring. No edema or consolidation.   Electronically Signed   By: Lowella Grip M.D.   On: 03/22/2014 21:14   Dg Chest 2 View  03/16/2014   CLINICAL DATA:  Dyspnea on exertion  EXAM: CHEST  2 VIEW  COMPARISON:  March 13, 2014 chest radiograph and chest CT March 10, 2014  FINDINGS: There is underlying emphysematous change. There is scarring in both lower lung zones as well as in the posterior segment right upper lobe. A 6 mm nodular opacity in the right middle lobe is unchanged from recent CT. There is no frank edema or consolidation. Heart size is within normal limits. Pulmonary vascularity reflects underlying emphysema. No adenopathy. There is atherosclerotic change in aorta. No bone lesions.  IMPRESSION: Underlying emphysematous change with areas of scarring. Small nodular opacity right middle lobe, stable. No edema or consolidation. No new lung opacity. No change in cardiac silhouette.   Electronically Signed   By: Lowella Grip M.D.   On: 03/16/2014 10:22    Dg Chest 2 View  03/09/2014   CLINICAL DATA:  Three-day history of difficulty breathing. History of emphysema  EXAM: CHEST  2 VIEW  COMPARISON:  Chest radiograph February 13, 2014; chest CT February 13, 2014  FINDINGS: There is underlying emphysema. There is scarring in the right lower lobe which appears stable. There is no frank edema or consolidation. The heart is mildly enlarged with pulmonary vascularity within normal limits. Is atherosclerotic change in aorta. No adenopathy. No bone lesions.  IMPRESSION: Underlying emphysema. Scarring right lower lobe, stable. No frank edema or consolidation. Heart prominent but stable.   Electronically Signed   By: Lowella Grip M.D.   On: 03/09/2014 11:47   Ct Angio Chest Pe W/cm &/or Wo Cm  03/11/2014   CLINICAL DATA:  Shortness of breath.  Recent pulmonary embolism.  EXAM: CT ANGIOGRAPHY CHEST WITH CONTRAST  TECHNIQUE: Multidetector CT imaging of the chest was performed using the standard protocol during bolus administration of intravenous contrast. Multiplanar CT image reconstructions and MIPs were obtained to evaluate the vascular anatomy.  CONTRAST:  125mL OMNIPAQUE IOHEXOL 350 MG/ML SOLN  COMPARISON:  02/13/2014  FINDINGS: THORACIC INLET/BODY WALL:  No acute abnormality.  MEDIASTINUM:  Normal heart size. No pericardial effusion. There is atherosclerosis, including the coronary arteries. A small persistent left SVC is incidentally noted. There is non opacification of the left atrial appendage. Although there is early opacification of the left heart, this was also seen previously. Noted history of atrial fibrillation. No acute or chronic pulmonary  embolism. No evidence of acute aortic pathology, although there is no enhancement within the systemic arterial tree.  LUNG WINDOWS:  Centrilobular emphysema which is mild. Bibasilar scarring which is stable. No edema or pneumonia. Trace bilateral pleural effusion.  There are multiple small scattered pulmonary nodules  that are peripheral. These are stable from 2014 at least, including the largest within the right middle lobe at 5 mm (image 50).  UPPER ABDOMEN:  No acute findings.  OSSEOUS:  No acute fracture.  No suspicious lytic or blastic lesions.  Review of the MIP images confirms the above findings.  IMPRESSION: 1. No acute or chronic pulmonary embolism. 2. Non enhancement of the left atrial appendage which could be delayed mixing or thrombus in this patient with history of atrial fibrillation. 3. Trace bilateral pleural effusion. 4. Emphysema.   Electronically Signed   By: Jorje Guild M.D.   On: 03/11/2014 00:07   US Venous Img Lower Bilateral  03/18/2014   CLINICAL DATA:  Bilateral lower extremity DVT. Cramping. Fell 1 week ago. History of tobacco abuse.  EXAM: BILATERAL LOWER EXTREMITY VENOUS DOPPLER ULTRASOUND  TECHNIQUE: Gray-scale sonography with compression, as well as color and duplex ultrasound, were performed to evaluate the deep venous system from the level of the common femoral vein through the popliteal and proximal calf veins.  COMPARISON:  10/10/2012  FINDINGS: ON the right, incompletely compressible thrombus at the saphenous femoral junction into the common femoral vein, and in the profunda femoral vein near its confluence with the common femoral. There is flow signal across this area these areas on color Doppler interrogation. Femoral and popliteal veins unremarkable. Visualized segments of the saphenous venous system normal in caliber and compressibility.  On the left, there is chronic mural thrombus across the saphenous femoral junction into the common femoral vein, in the deep femoral vein near its confluence with common femoral, and through the length of femoral vein l. There is flow through these segments on color Doppler interrogation. Popliteal vein in visualized calf veins unremarkable. Visualized segments of the saphenous venous system normal in caliber and compressibility.  IMPRESSION: 1.  Chronic nonocclusive mural thrombus in RIGHT profunda femoral and common femoral veins; and LEFT femoral, deep femoral, and common femoral veins. The distribution is similar to that seen on prior study. 2. No evidence of acute/superimposed DVT.   Electronically Signed   By: Arne Cleveland M.D.   On: 03/18/2014 09:45   Dg Chest Port 1 View  03/13/2014   CLINICAL DATA:  Hypoxia.  EXAM: PORTABLE CHEST - 1 VIEW  COMPARISON:  None.  FINDINGS: Cardiomegaly and pulmonary vascular prominence and diffuse interstitial prominence consistent with congestive heart failure. Bilateral pleural effusions. Subsegmental atelectasis right upper lobe. No acute bony abnormality .  IMPRESSION: 1. Congestive heart failure with pulmonary edema and bilateral pleural effusions.  2.  Subsegment atelectasis right upper lobe.   Electronically Signed   By: Marcello Moores  Register   On: 03/13/2014 09:08    Microbiology: Recent Results (from the past 240 hour(s))  Clostridium Difficile by PCR     Status: None   Collection Time: 03/16/14 10:34 AM  Result Value Ref Range Status   C difficile by pcr NEGATIVE NEGATIVE Final  Urine culture     Status: None   Collection Time: 03/22/14  8:38 PM  Result Value Ref Range Status   Specimen Description URINE, CLEAN CATCH  Final   Special Requests NONE  Final   Colony Count   Final    >=  100,000 COLONIES/ML Performed at [place] Performed at [facility]    Report Status 03/25/2014 FINAL  Final   Organism ID, Bacteria PROTEUS MIRABILIS  Final      Susceptibility   Proteus mirabilis - MIC*    AMPICILLIN >=32 RESISTANT Resistant     CEFAZOLIN <=4 SENSITIVE Sensitive     CEFTRIAXONE <=1 SENSITIVE Sensitive     CIPROFLOXACIN <=0.25 SENSITIVE Sensitive     GENTAMICIN <=1 SENSITIVE Sensitive     LEVOFLOXACIN <=0.12 SENSITIVE Sensitive     NITROFURANTOIN 128 RESISTANT Resistant     TOBRAMYCIN <=1 SENSITIVE Sensitive      TRIMETH/SULFA <=20 SENSITIVE Sensitive     PIP/TAZO <=4 SENSITIVE Sensitive     * PROTEUS MIRABILIS     Labs: Basic Metabolic Panel:  Recent Labs Lab 03/19/14 0607 03/19/14 1156 03/22/14 2037 03/23/14 0523  NA 138  --  129* 139  K 5.0  --  3.7 3.9  CL 96  --  95* 99  CO2 33*  --  28 31  GLUCOSE 151* 350* 115* 102*  BUN 41*  --  33* 29*  CREATININE 1.18  --  1.21 1.18  CALCIUM 10.1  --  8.8 9.1   Liver Function Tests:  Recent Labs Lab 03/22/14 2037  AST 26  ALT 25  ALKPHOS 81  BILITOT 0.6  PROT 6.8  ALBUMIN 3.5    Recent Labs Lab 03/22/14 2037  LIPASE 103*   No results for input(s): AMMONIA in the last 168 hours. CBC:  Recent Labs Lab 03/22/14 2036 03/23/14 0523  WBC 10.6* 9.3  NEUTROABS 6.5  --   HGB 12.9* 12.0*  HCT 38.8* 37.2*  MCV 99.0 100.3*  PLT 179 164   Cardiac Enzymes:  Recent Labs Lab 03/22/14 2036  TROPONINI 0.30*   BNP: BNP (last 3 results)  Recent Labs  03/11/14 0005 03/13/14 0526 03/22/14 2037  BNP 560.0* 491.0* 212.0*    ProBNP (last 3 results)  Recent Labs  07/26/13 1128 11/16/13 0839 01/10/14 1046  PROBNP 2036.0* 770.7* 1366.0*    CBG:  Recent Labs Lab 03/19/14 1707 03/19/14 2043 03/20/14 0713 03/20/14 1138 03/20/14 1700  GLUCAP 131* 168* 119* 169* 267*       Signed:  Zavannah Deblois  [facility] 03/25/2014, 4:00 PM

## 2014-03-25 NOTE — Evaluation (Signed)
Physical Therapy Evaluation Patient Details Name: Sholom Dulude MRN: [id number] DOB: May 12, 1921 Today's Date: 03/25/2014   History of Present Illness  Ranger Petrich is a 81 y.o. male with a history of COPD, CAD,  HTN, Chronic Atrial fibrillation, Chronic combined CHF, DM2,CKD Stage III, and  Remote hx of Colon Cancer who was brogut to the ED due to gross hematuria x 1 day.  An ABD Ct Scan was performed and he was found to incidentally have a RLL Pneumonia, but no acute GI findings, and a non-Obstructing Left Renal Stone.   He was  placed on IV Vancomycin and Aztreonam to cover HCAP and was referred for admission.  His Lovenox was discontinued.    He was discharged 2 days ago from the hospital for a CODP exacerbation.  Clinical Impression  Pt is a 81 year old male who presents to PT for assessment of functional mobility skills.  Pt was recently discharged form hospital 2 days ago, with orders at that time for HHPT services.  Pt found sitting at EOB receiving medication from RN;  Noted good sitting balance.  Pt currently on 2 L of O2, which he does use at home.  Pt was mod (I) with bed mobility skills and transfers with use of RW.  Min guard for gait with use of RW for 90 feet.  Pt did require 1 standing rest break, cued by PT for rest break secondary to SOB.  VC during gait for upright posturing and deep breathing to assist with gait patterning.  Pt currently at baseline level of function and recommend continued PT with HHPT to address activity tolerance and safety in home environment.  No DME recommendations.     Follow Up Recommendations Home health PT    Equipment Recommendations  None recommended by PT       Precautions / Restrictions Precautions Precautions: Fall Restrictions Weight Bearing Restrictions: No      Mobility  Bed Mobility Overal bed mobility: Modified Independent                Transfers Overall transfer level: Needs assistance Equipment used: Rolling walker  (2 wheeled) Transfers: Sit to/from [employer] Transfers Sit to Stand: Modified independent (Device/Increase time) Stand pivot transfers: Supervision          Ambulation/Gait Ambulation/Gait assistance: Min guard Ambulation Distance (Feet): 90 Feet Assistive device: Rolling walker (2 wheeled) Gait Pattern/deviations: Trunk flexed;Step-through pattern   Gait velocity interpretation: at or above normal speed for age/gender General Gait Details: VC for standing rest break during gait secondary to SOB.  VC for upright posturing and deep breathing during gait.       Balance Overall balance assessment: No apparent balance deficits (not formally assessed)                                           Pertinent Vitals/Pain Pain Assessment: No/denies pain    Home Living Family/patient expects to be discharged to:: Private residence Living Arrangements: Alone Available Help at Discharge: Family;Personal care attendant;Available 24 hours/day Type of Home: Apartment Home Access: Level entry     Home Layout: One level Home Equipment: Grab bars - toilet;Grab bars - tub/shower;Walker - 2 wheels      Prior Function Level of Independence: Independent with assistive device(s)               Hand Dominance   Dominant  Hand: Right    Extremity/Trunk Assessment               Lower Extremity Assessment: Generalized weakness         Communication   Communication: No difficulties  Cognition Arousal/Alertness: Awake/alert Behavior During Therapy: WFL for tasks assessed/performed Overall Cognitive Status: Within Functional Limits for tasks assessed                       Assessment/Plan    PT Assessment All further PT needs can be met in the next venue of care  PT Diagnosis Difficulty walking;Generalized weakness   PT Problem List Decreased strength;Decreased activity tolerance;Decreased mobility;Decreased balance  PT Treatment  Interventions DME instruction;Gait training;Neuromuscular re-education;Balance training;Stair training;Functional mobility training;Therapeutic activities;Therapeutic exercise;Patient/family education   PT Goals (Current goals can be found in the Care Plan section) Acute Rehab PT Goals PT Goal Formulation: All assessment and education complete, DC therapy     End of Session Equipment Utilized During Treatment: Gait belt Activity Tolerance: Treatment limited secondary to medical complications (Comment) (Limited by SOB) Patient left: in bed;with call bell/phone within reach;with bed alarm set           Time: 0735-4301 PT Time Calculation (min) (ACUTE ONLY): 15 min   Charges:   PT Evaluation $Initial PT Evaluation Tier I: 1 Procedure     Lonna Cobb, DPT [id number]  03/25/2014, 9:55 AM

## 2014-03-25 NOTE — Progress Notes (Signed)
Patient states understanding of discharge instructions, prescriptions given 

## 2014-03-29 DIAGNOSIS — R531 Weakness: Secondary | ICD-10-CM | POA: Insufficient documentation

## 2014-06-21 DEATH — deceased

## 2015-07-21 IMAGING — CR DG CHEST 1V PORT
2 series · 2 of 2 positions shown · non-contrast
Comparison: Chest x-ray 12/24/2011.

CLINICAL DATA: Shortness of breath.

PORTABLE CHEST - 1 VIEW

[portable (1 of 2)]
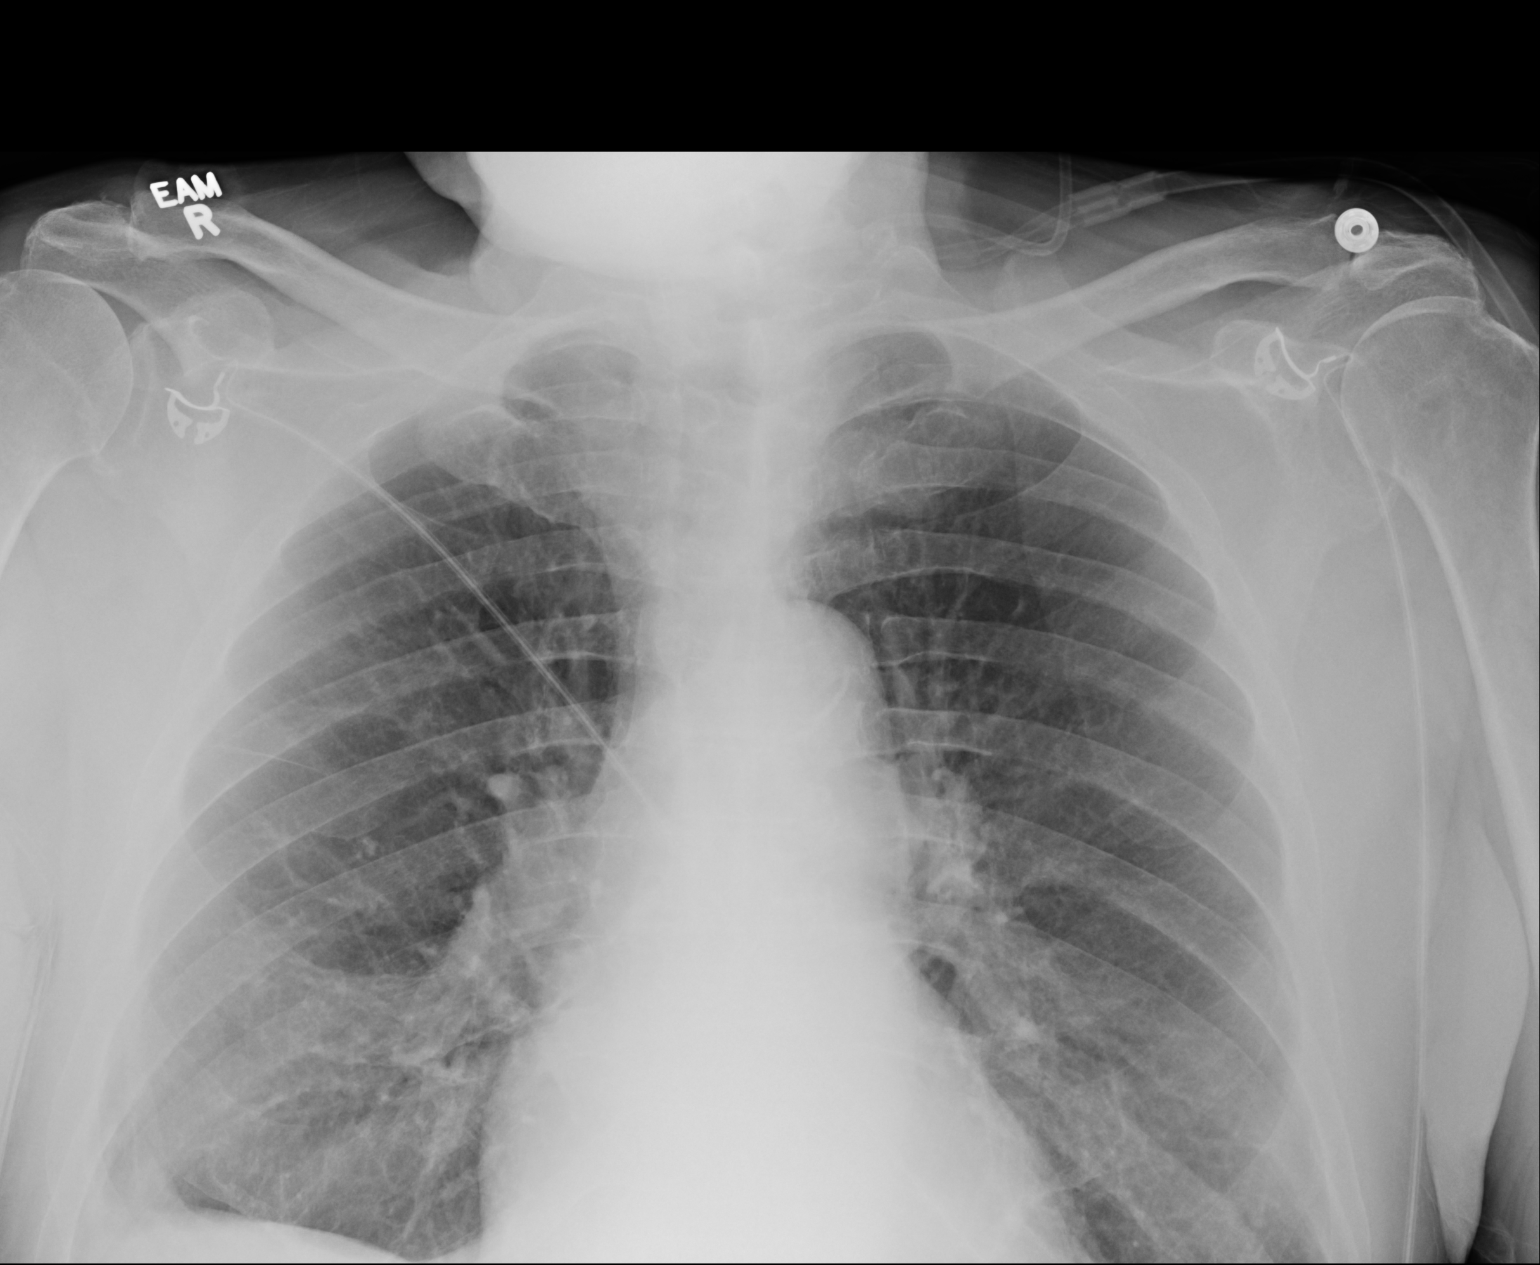

[portable (2 of 2)]
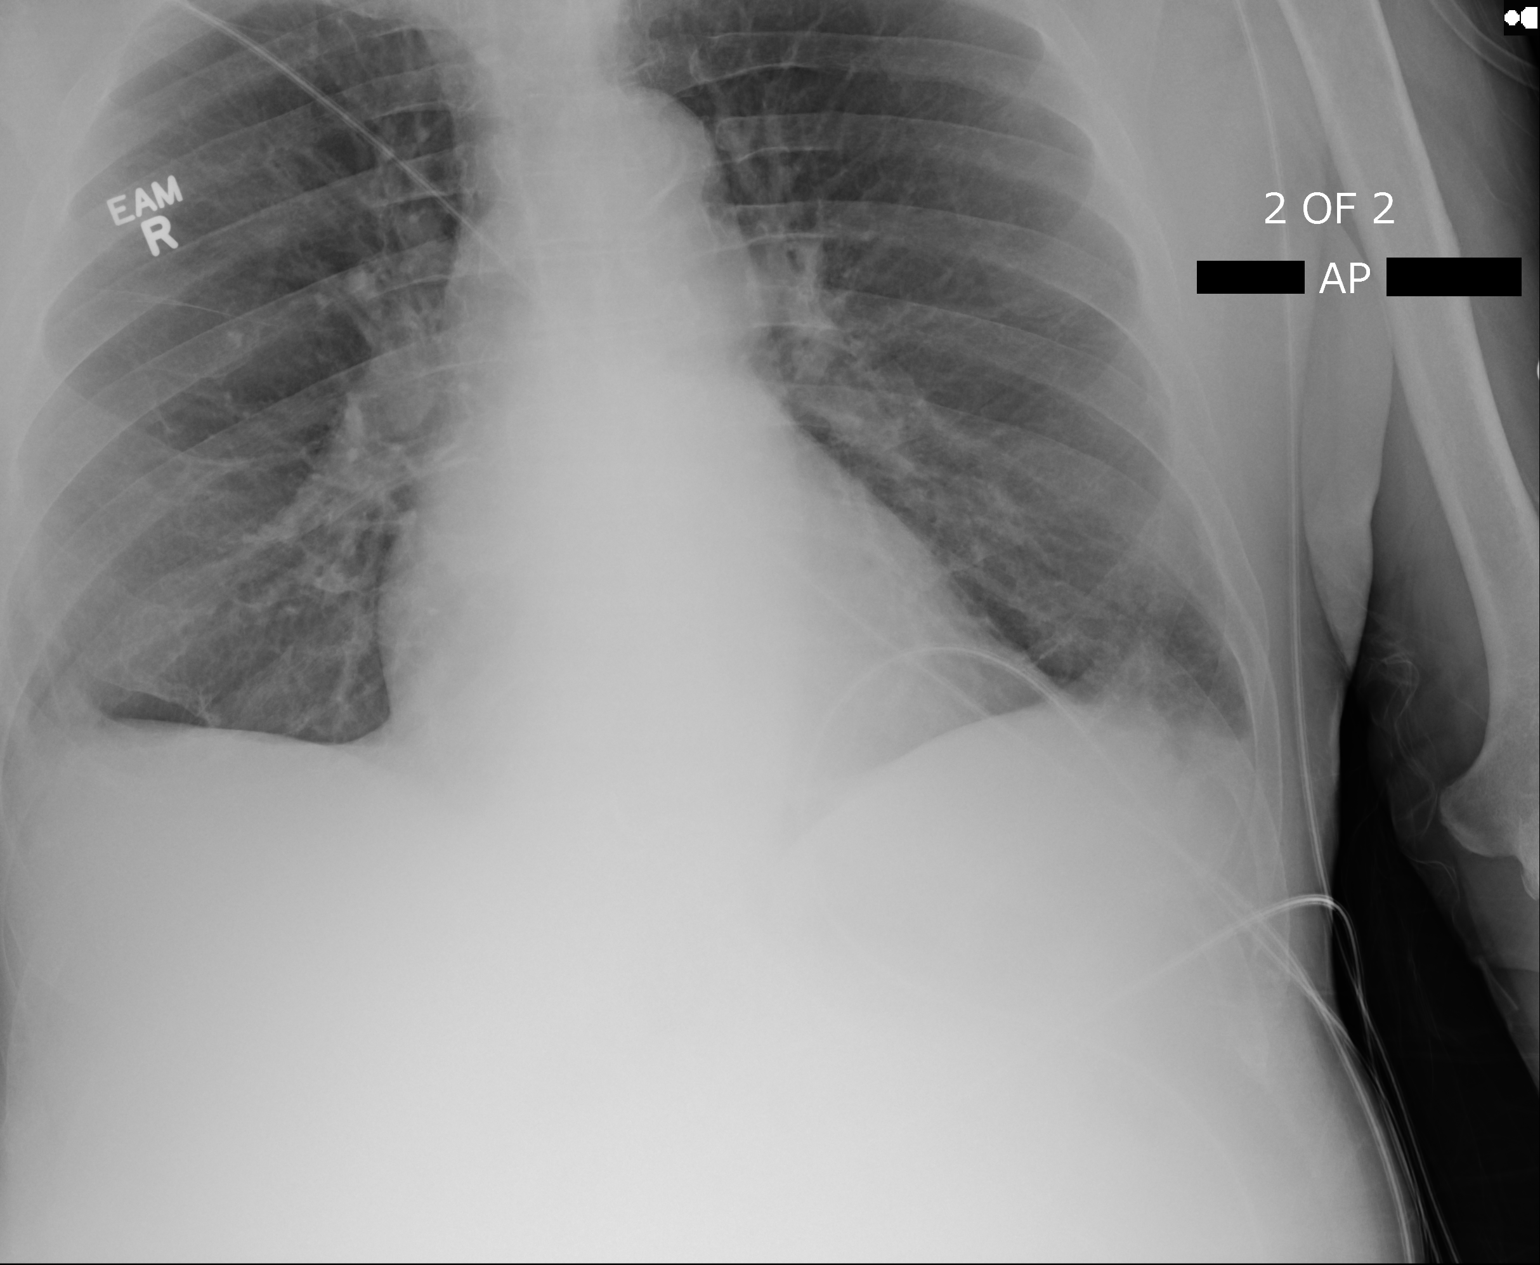

[2 of 2 positions shown; findings below may reference images not displayed]

FINDINGS: Bibasilar linear opacities are similar to prior
examinations, most compatible with areas of mild chronic scarring.
No acute consolidative airspace disease.  No pleural effusions.  No
evidence of pulmonary edema.  Heart size is within normal limits.
Upper mediastinal contours are unremarkable.  Atherosclerosis of
the thoracic aorta.
IMPRESSION: 1.  No radiographic evidence of acute cardiopulmonary disease.
2.  Bibasilar scarring is unchanged.
3.  Atherosclerosis.

## 2015-10-06 IMAGING — CR DG CHEST 2V
2 series · 2 of 2 positions shown · non-contrast
Comparison: December 04, 2012.

CLINICAL DATA: Shortness of breath.

EXAM:
CHEST  2 VIEW

[view not recorded (1 of 2)]
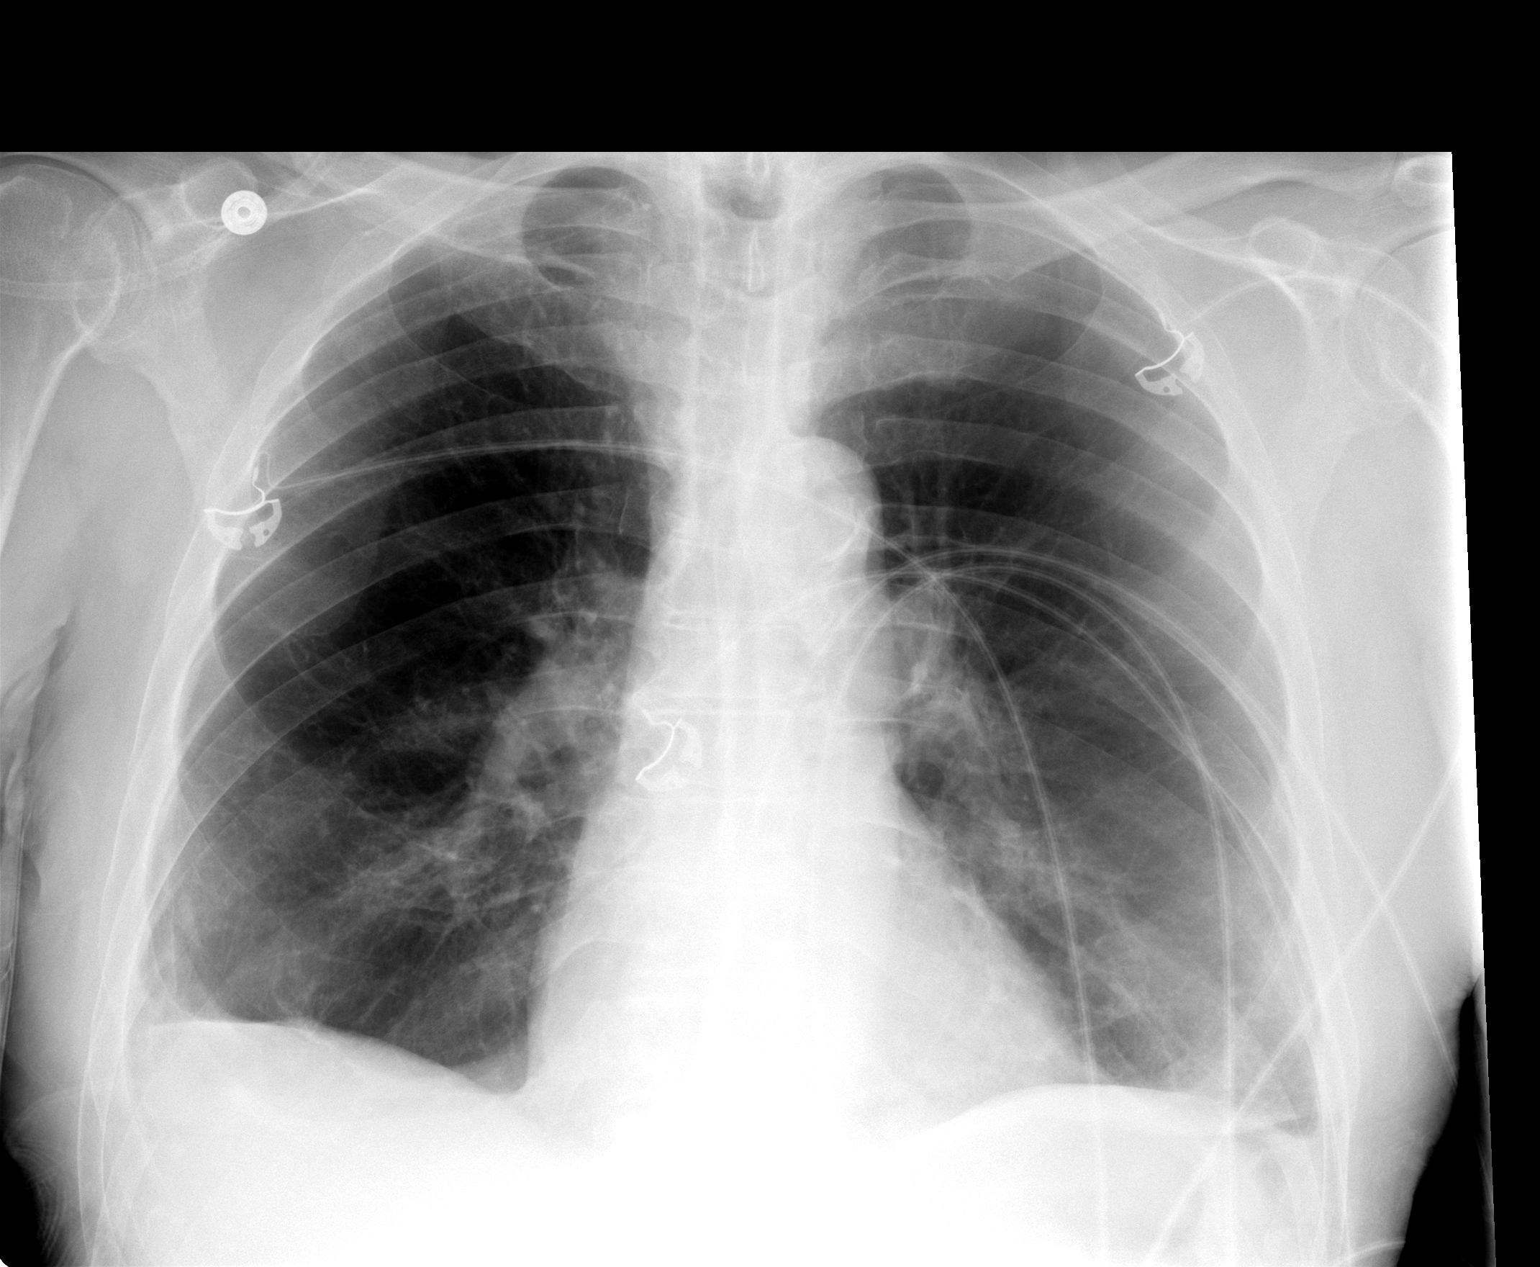

[view not recorded (2 of 2)]
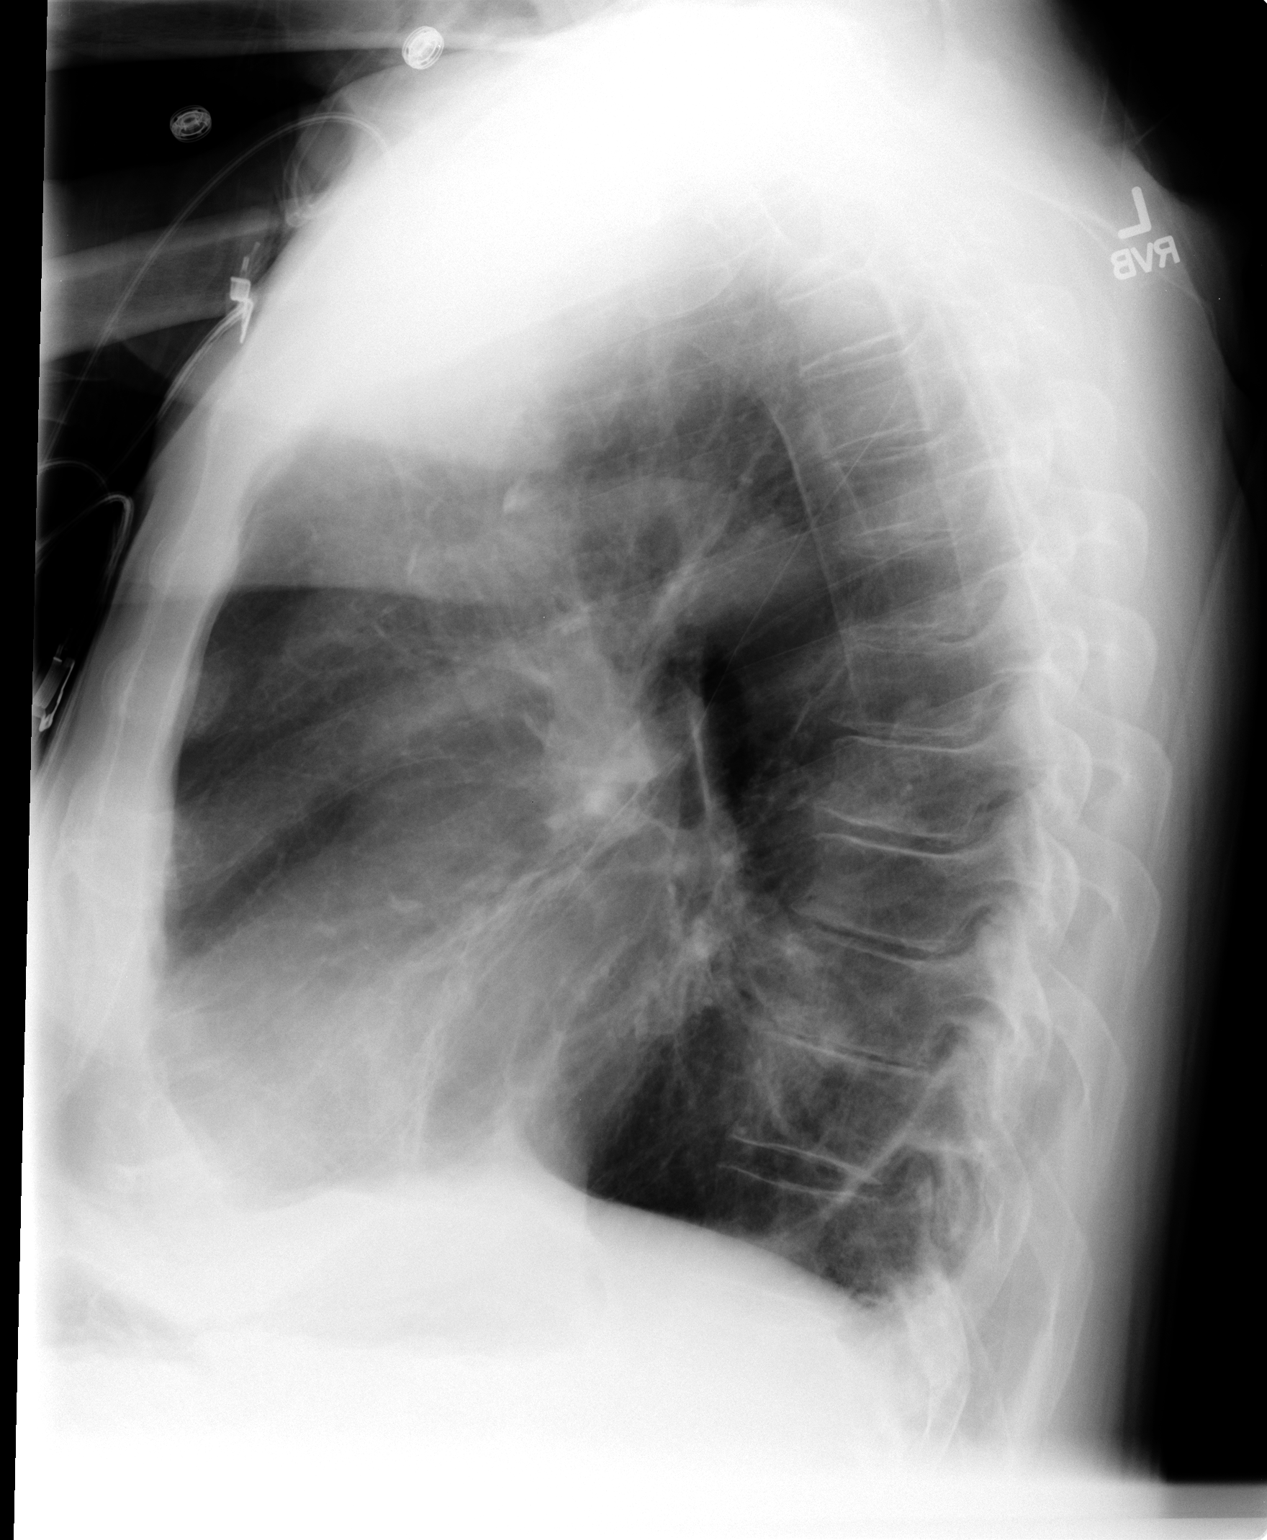

[2 of 2 positions shown; findings below may reference images not displayed]

FINDINGS: Cardiomediastinal silhouette appears normal. Hyperinflation of the
lungs is noted. Stable bilateral basilar scarring is noted. No acute
pulmonary disease is noted. No pneumothorax is noted.
IMPRESSION: No acute cardiopulmonary abnormality seen.

## 2016-05-16 IMAGING — CR DG CHEST 1V PORT
1 series · 1 of 1 positions shown · non-contrast
Comparison: 07/09/2013, 06/26/2013 and 12/29/2012

CLINICAL DATA: Shortness of breath and weakness.

EXAM:
PORTABLE CHEST - 1 VIEW

[portable]
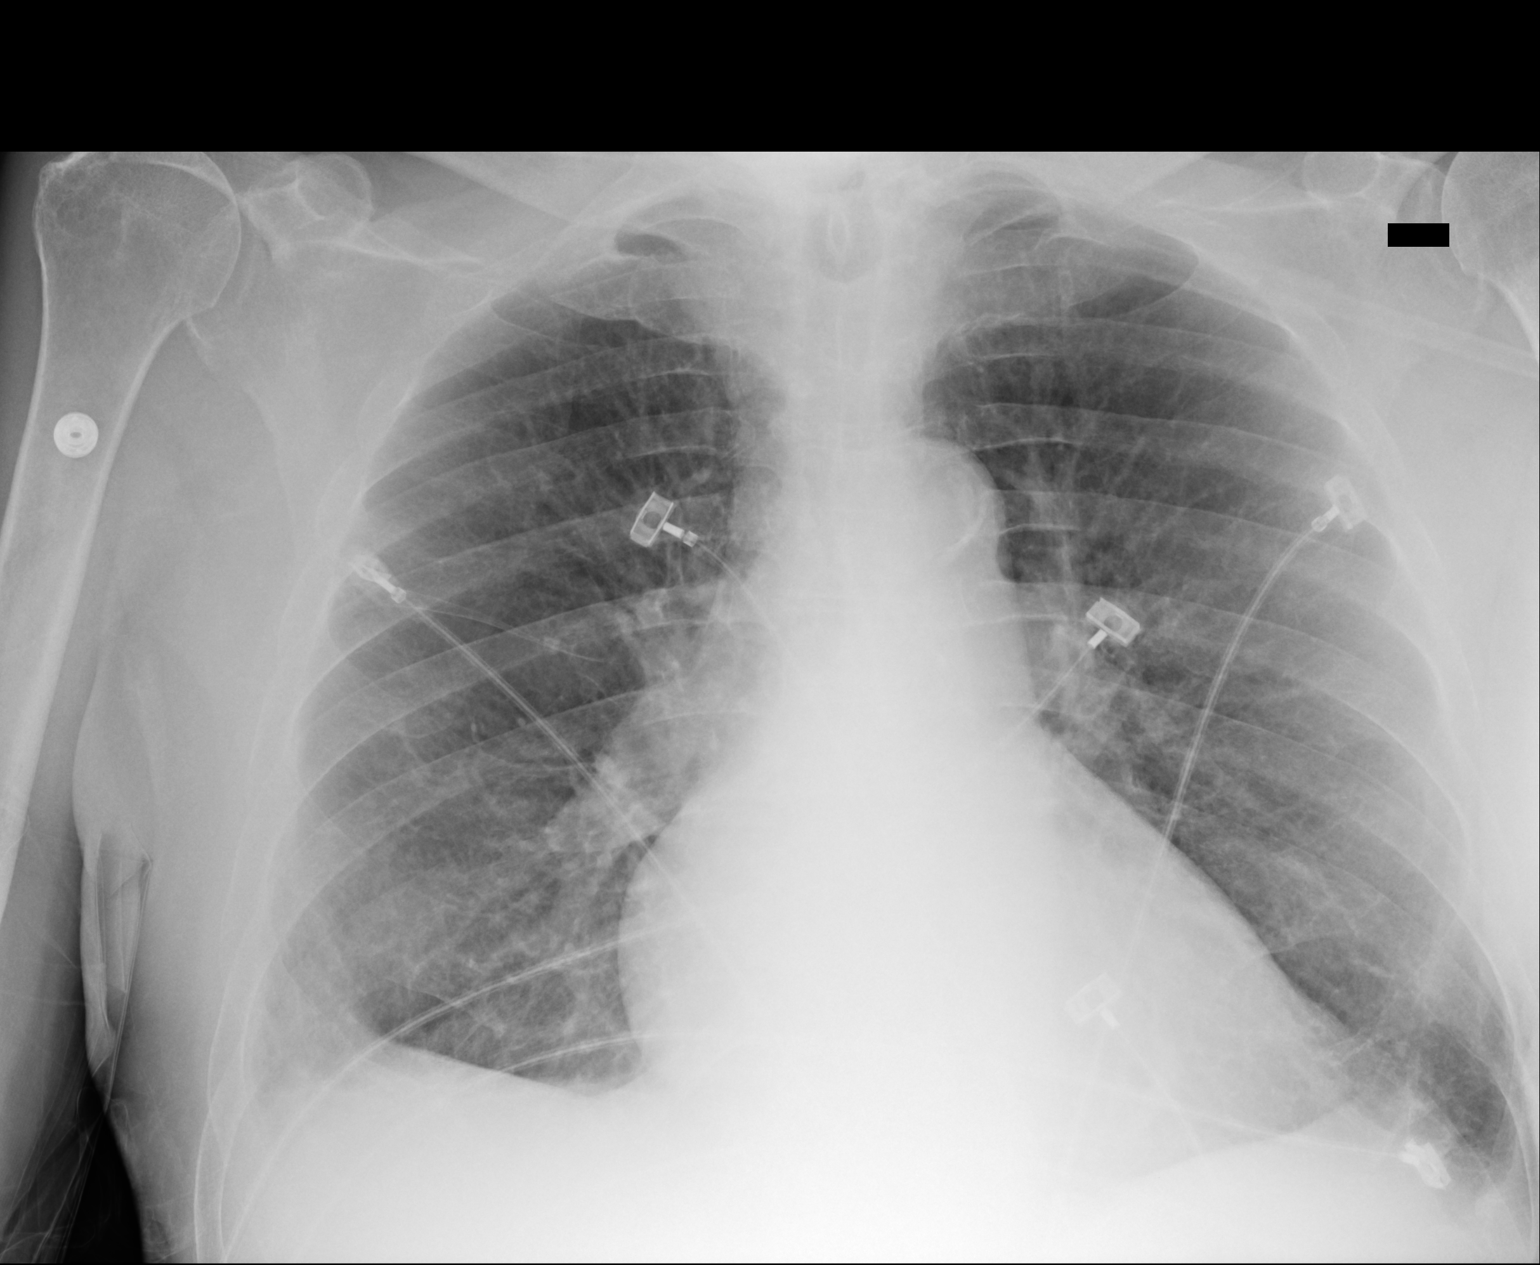

[1 of 1 positions shown; findings below may reference images not displayed]

FINDINGS: Lungs are adequately inflated with chronic bibasilar scarring. There
is no focal consolidation or effusion. There is mild stable
cardiomegaly. There is minimal calcified plaque over the aortic
arch. Remainder the exam is unchanged.
IMPRESSION: No acute cardiopulmonary disease.

Chronic stable bibasilar scarring.  Mild stable cardiomegaly.
